# Patient Record
Sex: Female | Born: 1950 | Hispanic: No | Marital: Married | Smoking: Never smoker
Health system: Southern US, Community
[De-identification: ages and names within clinical notes are randomized; demographics above are authoritative.]

## PROBLEM LIST (undated history)

## (undated) DIAGNOSIS — E785 Hyperlipidemia, unspecified: Secondary | ICD-10-CM

## (undated) DIAGNOSIS — E119 Type 2 diabetes mellitus without complications: Secondary | ICD-10-CM

## (undated) DIAGNOSIS — T7840XA Allergy, unspecified, initial encounter: Secondary | ICD-10-CM

## (undated) DIAGNOSIS — M25561 Pain in right knee: Secondary | ICD-10-CM

## (undated) DIAGNOSIS — I10 Essential (primary) hypertension: Secondary | ICD-10-CM

## (undated) DIAGNOSIS — G8929 Other chronic pain: Secondary | ICD-10-CM

## (undated) DIAGNOSIS — M25562 Pain in left knee: Secondary | ICD-10-CM

## (undated) HISTORY — DX: Type 2 diabetes mellitus without complications: E11.9

## (undated) HISTORY — DX: Other chronic pain: G89.29

## (undated) HISTORY — DX: Hyperlipidemia, unspecified: E78.5

## (undated) HISTORY — DX: Essential (primary) hypertension: I10

## (undated) HISTORY — DX: Allergy, unspecified, initial encounter: T78.40XA

## (undated) HISTORY — DX: Pain in right knee: M25.561

## (undated) HISTORY — DX: Pain in left knee: M25.562

---

## 1992-02-18 HISTORY — PX: KNEE SURGERY: SHX244

## 1996-02-18 HISTORY — PX: DILATION AND CURETTAGE, DIAGNOSTIC / THERAPEUTIC: SUR384

## 2007-02-18 HISTORY — PX: ANKLE SURGERY: SHX546

## 2013-01-19 DIAGNOSIS — E1165 Type 2 diabetes mellitus with hyperglycemia: Secondary | ICD-10-CM | POA: Insufficient documentation

## 2014-11-22 DIAGNOSIS — E785 Hyperlipidemia, unspecified: Secondary | ICD-10-CM

## 2014-11-22 DIAGNOSIS — M545 Low back pain: Secondary | ICD-10-CM

## 2014-11-22 DIAGNOSIS — I10 Essential (primary) hypertension: Secondary | ICD-10-CM | POA: Insufficient documentation

## 2014-11-22 DIAGNOSIS — M25562 Pain in left knee: Secondary | ICD-10-CM

## 2014-11-22 DIAGNOSIS — E1169 Type 2 diabetes mellitus with other specified complication: Secondary | ICD-10-CM | POA: Insufficient documentation

## 2014-11-22 DIAGNOSIS — M25561 Pain in right knee: Secondary | ICD-10-CM

## 2014-11-22 DIAGNOSIS — G8929 Other chronic pain: Secondary | ICD-10-CM | POA: Insufficient documentation

## 2014-11-22 DIAGNOSIS — E1159 Type 2 diabetes mellitus with other circulatory complications: Secondary | ICD-10-CM | POA: Insufficient documentation

## 2016-07-24 DIAGNOSIS — J301 Allergic rhinitis due to pollen: Secondary | ICD-10-CM | POA: Insufficient documentation

## 2017-11-18 DIAGNOSIS — I10 Essential (primary) hypertension: Secondary | ICD-10-CM | POA: Diagnosis not present

## 2017-11-18 DIAGNOSIS — E1169 Type 2 diabetes mellitus with other specified complication: Secondary | ICD-10-CM | POA: Diagnosis not present

## 2017-11-18 DIAGNOSIS — Z794 Long term (current) use of insulin: Secondary | ICD-10-CM | POA: Diagnosis not present

## 2017-11-18 DIAGNOSIS — Z23 Encounter for immunization: Secondary | ICD-10-CM | POA: Diagnosis not present

## 2017-11-18 DIAGNOSIS — E785 Hyperlipidemia, unspecified: Secondary | ICD-10-CM | POA: Diagnosis not present

## 2017-11-18 DIAGNOSIS — E1165 Type 2 diabetes mellitus with hyperglycemia: Secondary | ICD-10-CM | POA: Diagnosis not present

## 2017-11-18 DIAGNOSIS — Z0000 Encounter for general adult medical examination without abnormal findings: Secondary | ICD-10-CM | POA: Diagnosis not present

## 2017-11-18 DIAGNOSIS — Z1239 Encounter for other screening for malignant neoplasm of breast: Secondary | ICD-10-CM | POA: Diagnosis not present

## 2017-11-18 DIAGNOSIS — Z1211 Encounter for screening for malignant neoplasm of colon: Secondary | ICD-10-CM | POA: Diagnosis not present

## 2017-11-18 DIAGNOSIS — Z780 Asymptomatic menopausal state: Secondary | ICD-10-CM | POA: Diagnosis not present

## 2017-11-24 ENCOUNTER — Other Ambulatory Visit: Payer: Self-pay | Admitting: Pediatrics

## 2017-11-24 DIAGNOSIS — Z780 Asymptomatic menopausal state: Secondary | ICD-10-CM

## 2017-11-24 DIAGNOSIS — Z1231 Encounter for screening mammogram for malignant neoplasm of breast: Secondary | ICD-10-CM

## 2017-12-01 DIAGNOSIS — Z6835 Body mass index (BMI) 35.0-35.9, adult: Secondary | ICD-10-CM | POA: Diagnosis not present

## 2017-12-01 DIAGNOSIS — M170 Bilateral primary osteoarthritis of knee: Secondary | ICD-10-CM | POA: Diagnosis not present

## 2017-12-01 DIAGNOSIS — M25562 Pain in left knee: Secondary | ICD-10-CM | POA: Diagnosis not present

## 2017-12-01 DIAGNOSIS — M25561 Pain in right knee: Secondary | ICD-10-CM | POA: Diagnosis not present

## 2017-12-01 DIAGNOSIS — M1712 Unilateral primary osteoarthritis, left knee: Secondary | ICD-10-CM | POA: Diagnosis not present

## 2017-12-01 DIAGNOSIS — G8929 Other chronic pain: Secondary | ICD-10-CM | POA: Diagnosis not present

## 2017-12-31 ENCOUNTER — Ambulatory Visit: Payer: Self-pay | Admitting: Nurse Practitioner

## 2018-01-01 DIAGNOSIS — E113293 Type 2 diabetes mellitus with mild nonproliferative diabetic retinopathy without macular edema, bilateral: Secondary | ICD-10-CM | POA: Diagnosis not present

## 2018-01-01 LAB — HM DIABETES EYE EXAM

## 2018-01-18 ENCOUNTER — Encounter: Payer: Self-pay | Admitting: Nurse Practitioner

## 2018-01-18 ENCOUNTER — Ambulatory Visit (INDEPENDENT_AMBULATORY_CARE_PROVIDER_SITE_OTHER): Payer: Self-pay | Admitting: Nurse Practitioner

## 2018-01-18 ENCOUNTER — Other Ambulatory Visit: Payer: Self-pay

## 2018-01-18 VITALS — BP 120/67 | HR 79 | Temp 98.7°F | Ht 62.0 in | Wt 194.2 lb

## 2018-01-18 DIAGNOSIS — E1165 Type 2 diabetes mellitus with hyperglycemia: Secondary | ICD-10-CM

## 2018-01-18 DIAGNOSIS — H25019 Cortical age-related cataract, unspecified eye: Secondary | ICD-10-CM | POA: Insufficient documentation

## 2018-01-18 DIAGNOSIS — Z7689 Persons encountering health services in other specified circumstances: Secondary | ICD-10-CM

## 2018-01-18 DIAGNOSIS — Z794 Long term (current) use of insulin: Secondary | ICD-10-CM

## 2018-01-18 MED ORDER — INSULIN DEGLUDEC 100 UNIT/ML ~~LOC~~ SOPN: 26.0000 [IU] | PEN_INJECTOR | Freq: Every day | SUBCUTANEOUS | 1 refills | Status: DC

## 2018-01-18 MED ORDER — SEMAGLUTIDE(0.25 OR 0.5MG/DOS) 2 MG/1.5ML ~~LOC~~ SOPN: 0.5000 mg | PEN_INJECTOR | SUBCUTANEOUS | 2 refills | Status: DC

## 2018-01-18 MED ORDER — PEN NEEDLES 32G X 5 MM MISC: 1.0000 | Freq: Every day | 3 refills | Status: DC

## 2018-01-18 NOTE — Patient Instructions (Addendum)
Summer Young,   Thank you for coming in to clinic today.  1. [place] about your silver sneakers benefits [phone]  [address]  [place], [place] [address]  2. Try to eat only 1-3 carb servings per meal. - Focus on low glycemic carbohydrates  3. Check blood sugars 3 times daily AND any time you feel low. Anything less than 75 treat as a low and have a carb snack.    4. STOP 70/30 insulin  - START Tresiba 26 units once daily.  Basal insulin - cover without peak over 24 hours.  - START Ozempic 0.25 mg every 7 days for first 2 weeks,  Then 0.5 mg every 7 days and continue to your next appointment.  Continue metformin and glipizide without change.  Phone call from [employer] for diabetes AND from lifestyle center for diabetes classes.  Please schedule a follow-up appointment with [name], AGNP. Return in about 6 weeks (around 03/01/2018) for diabetes with A1c and your sugar log.  If you have any other questions or concerns, please feel free to call the clinic or send a message through [place]. You may also schedule an earlier appointment if necessary.  You will receive a survey after today's visit either digitally by e-mail or paper by [employer]. Your experiences and feedback matter to [place].  Please respond so we know how we are doing as we provide care for you.   [name], DNP, AGNP-BC Adult Gerontology Nurse Practitioner [place]

## 2018-01-18 NOTE — Progress Notes (Signed)
Subjective:    Patient ID: Summer Young, female    DOB: October 15, 1950, 67 y.o.   MRN: [id number]  Summer Young is a 67 y.o. female presenting on 01/18/2018 for Establish Care (diabetes, Knee surgery schedule in 05/2017)  HPI Establish Care New Provider Pt last seen by PCP [facility] [place].  Obtain records from [facility].    Diabetes Requests endocrinology for DM.  She states she was previously well controlled with A1c less than 8.0% when she was on Jardiance and metformin in past.  Since being on insulin, has had hard to control A1c.  - Patient is scheduled for knee surgery in April 2019 with [facility] Dr. Merlene Morse.  He encouraged endocrinology as it is recommended to have A1c <7.0% before surgery. - Patient reports she has used several insulins in past.  Is currently on Humulin 70/30, continues to have increased A1c - last = 9.2.  Patient has had A1c between 9-10% for last 3 years, verified by review of [facility] labs. - Is currently on metformin, glipizide 2.5 mg 24 hr tab, and Humulin 70/30 - Is having lows several times per week with 70/30 insulin.  Has been on 20 units, 25 in evening and sometimes only 20 to prevent overnight low.  Patient notes shakiness, chills, weakness, sensation of near syncope with perceived low.  Patient has not checked actual low blood sugar value with symptoms. - States she has difficulty knowing what to eat.  Eats small meals, few refined carbs.   - Patient is checking CBG daily.  In am usually 102-130.   - Patient reports higher CBGs after meals in past, but no recent checks later in day.   - She is not currently symptomatic.  - She denies polydipsia, polyphagia, polyuria, headaches, diaphoresis, shakiness, chills, pain, numbness or tingling in extremities and changes in vision.   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 - Her diet is moderate in salt, moderate in fat, and moderate in  carbohydrates. - Patient stopped Jardiance 2/2 frequent yeast infections.  Stopped Levemir 2/2 skin irritation.  PREVENTION: Eye exam current (within one year): no Foot exam current (within one year): no  Lipid/ASCVD risk reduction - on statin: yes Kidney protection - on ace or arb: yes   Past Medical History:  Diagnosis Date  . Chronic pain of both knees   . Diabetes mellitus without complication ([place])   . Hyperlipidemia   . Hypertension    Past Surgical History:  Procedure Laterality Date  . ANKLE SURGERY Left 2009  . DILATION AND CURETTAGE, DIAGNOSTIC / THERAPEUTIC  1998   miscarriage  . KNEE SURGERY Left 1994   ACL   Social History   Socioeconomic History  . Marital status: Married    Spouse name: Not on file  . Number of children: Not on file  . Years of education: Not on file  . Highest education level: High school graduate  Occupational History  . Not on file  Social Needs  . Financial resource strain: Not on file  . Food insecurity:    Worry: Not on file    Inability: Not on file  . Transportation needs:    Medical: Not on file    Non-medical: Not on file  Tobacco Use  . Smoking status: Never Smoker  . Smokeless tobacco: Never Used  Substance and Sexual Activity  . Alcohol use: Yes    Comment: occasinal  . Drug use: Never  .  Sexual activity: Yes    Birth control/protection: None  Lifestyle  . Physical activity:    Days per week: Not on file    Minutes per session: Not on file  . Stress: Not on file  Relationships  . Social connections:    Talks on phone: Not on file    Gets together: Not on file    Attends religious service: Not on file    Active member of club or organization: Not on file    Attends meetings of clubs or organizations: Not on file    Relationship status: Not on file  . Intimate partner violence:    Fear of current or ex partner: Not on file    Emotionally abused: Not on file    Physically abused: Not on file    Forced  sexual activity: Not on file  Other Topics Concern  . Not on file  Social History Narrative  . Not on file   Family History  Problem Relation Age of Onset  . Diabetes Mother   . Diabetes Father   . Stroke Father   . Diabetes Sister   . Heart attack Other    Current Outpatient Medications on File Prior to Visit  Medication Sig  . atorvastatin (LIPITOR) 40 MG tablet Take 40 mg by mouth daily.  . Biotin 1000 MCG tablet Take by mouth.  . Cholecalciferol (VITAMIN D3) 50 MCG (2000 UT) capsule Take by mouth.  Marland Kitchen glipiZIDE (GLUCOTROL XL) 2.5 MG 24 hr tablet Take by mouth.  Marland Kitchen ibuprofen (ADVIL,MOTRIN) 400 MG tablet Take by mouth.  . Insulin Isophane & Regular Human (HUMULIN 70/30 KWIKPEN) (70-30) 100 UNIT/ML PEN Inject into the skin.  . metFORMIN (GLUCOPHAGE-XR) 500 MG 24 hr tablet Take 3 tablets by mouth daily with supper  . spironolactone (ALDACTONE) 25 MG tablet Take 25 mg by mouth daily.  . vitamin B-12 (CYANOCOBALAMIN) 1000 MCG tablet Take by mouth.  . Flax Oil-Fish Oil-Borage Oil CAPS Take by mouth.   No current facility-administered medications on file prior to visit.     Review of Systems  Constitutional: Negative for activity change, appetite change, fatigue and unexpected weight change.  Eyes: Negative for pain and visual disturbance.  Respiratory: Negative for cough, chest tightness and shortness of breath.   Cardiovascular: Negative for chest pain, palpitations and leg swelling.  Gastrointestinal: Negative for abdominal pain, blood in stool, constipation, diarrhea, nausea and vomiting.  Endocrine: Negative for cold intolerance, heat intolerance, polydipsia, polyphagia and polyuria.  Genitourinary: Negative for frequency.  Musculoskeletal: Negative for arthralgias, gait problem, myalgias and neck stiffness.  Skin: Negative for rash.  Neurological: Negative for dizziness, speech difficulty, weakness, light-headedness, numbness and headaches.  Hematological: Negative for  adenopathy.  Psychiatric/Behavioral: Negative for dysphoric mood and sleep disturbance. The patient is not nervous/anxious.    Per HPI unless specifically indicated above     Objective:    BP 120/67 (BP Location: Left Arm, Patient Position: Sitting, Cuff Size: Large)   Pulse 79   Temp 98.7 F (37.1 C) (Oral)   Ht 5\' 2"  (1.575 m)   Wt 194 lb 3.2 oz (88.1 kg)   BMI 35.52 kg/m   Wt Readings from Last 3 Encounters:  01/18/18 194 lb 3.2 oz (88.1 kg)    Physical Exam  Constitutional: She is oriented to person, place, and time. She appears well-developed and well-nourished. No distress.  HENT:  Head: Normocephalic and atraumatic.  Neck: Normal range of motion. Neck supple. Carotid bruit is not present.  Cardiovascular: Normal rate, regular rhythm, S1 normal, S2 normal, normal heart sounds and intact distal pulses.  Pulmonary/Chest: Effort normal and breath sounds normal. No respiratory distress.  Musculoskeletal: She exhibits no edema (pedal).  Neurological: She is alert and oriented to person, place, and time.  Skin: Skin is warm and dry. Capillary refill takes less than 2 seconds.  Psychiatric: She has a normal mood and affect. Her behavior is normal. Judgment and thought content normal.  Vitals reviewed.      Assessment & Plan:   Problem List Items Addressed This Visit      Endocrine   Type 2 diabetes mellitus with hyperglycemia ([place]) - Primary    UncontrolledDM with A1c 9.2% on last check 11/18/2017 at [facility] primary care.  Stable over last 3 years and has been uncontrolled for the duration of last 3 years. Patient is willing to try new medicines as she does not like taking Humulin 70/30 Goal A1c < 7.0%. - Complications - hyperlipidemia, hyperglycemia, hypoglycemia and cataracts.  Plan:  1. Change therapy:  - CONTINUE metformin and glipizide - STOP Humulin 70/30 - START Tresiba 26 units once daily - START Ozempicinjection.  Inject 0.25 mg weekly for 4 weeks.  Increase to 0.5  mg weekly and continue to next visit.  Will likely continue with increase to 1 mg weekly at that time. 2. Encourage improved lifestyle: - low carb/low glycemic diet handout provided - Increase physical activity to 30 minutes most days of the week.  Explained that increased physical activity increases body's use of sugar for energy. - Referral placed for lifestyle center for DM education 3. Check fasting am CBG and two additional daily CBGs.  Bring log to next visit for review - check as given on handout. 4. Continue ASA, ACEi and Statin 5. Referral also placed to [facility] for Care Management for DM to assist with bringing patient's A1c to goal. 6. Follow-up 4-6 weeks.  Patient to call if CBG fasting in am worsens with change of insulin.  Can consider endocrinology referral at any time.      Relevant Medications   atorvastatin (LIPITOR) 40 MG tablet   glipiZIDE (GLUCOTROL XL) 2.5 MG 24 hr tablet   Insulin Isophane & Regular Human (HUMULIN 70/30 KWIKPEN) (70-30) 100 UNIT/ML PEN   metFORMIN (GLUCOPHAGE-XR) 500 MG 24 hr tablet   insulin degludec (TRESIBA FLEXTOUCH) 100 UNIT/ML SOPN FlexTouch Pen   Insulin Pen Needle (PEN NEEDLES) 32G X 5 MM MISC   Semaglutide,0.25 or 0.5MG /DOS, (OZEMPIC, 0.25 OR 0.5 MG/DOSE,) 2 MG/1.5ML SOPN   Other Relevant Orders   AMB Referral to [place] [facility]   Ambulatory referral to diabetic education    Other Visit Diagnoses    Encounter to establish care         Previous PCP was at [facility].  Records are reviewed in [place].  Past medical, family, and surgical history reviewed w/ patient in clinic.   Meds ordered this encounter  Medications  . insulin degludec (TRESIBA FLEXTOUCH) 100 UNIT/ML SOPN FlexTouch Pen    Sig: Inject 0.26 mLs (26 Units total) into the skin daily.    Dispense:  3 pen    Refill:  1    Order Specific Question:   Supervising Provider    Answer:   Olin Hauser [[id number]]  . Insulin Pen Needle (PEN NEEDLES) 32G  X 5 MM MISC    Sig: 1 Device by Does not apply route daily.    Dispense:  100 each  Refill:  3    Order Specific Question:   Supervising Provider    Answer:   Olin Hauser [[id number]]  . Semaglutide,0.25 or 0.5MG /DOS, (OZEMPIC, 0.25 OR 0.5 MG/DOSE,) 2 MG/1.5ML SOPN    Sig: Inject 0.5 mg into the skin every 7 (seven) days.    Dispense:  2 pen    Refill:  2    Order Specific Question:   Supervising Provider    Answer:   Olin Hauser [[id number]]     Follow up plan: Return in about 6 weeks (around 03/01/2018) for diabetes with A1c and your sugar log.  [name], DNP, AGPCNP-BC Adult Gerontology Primary Care Nurse Practitioner [facility] 01/18/2018, 10:35 AM

## 2018-01-18 NOTE — Assessment & Plan Note (Addendum)
UncontrolledDM with A1c 9.2% on last check 11/18/2017 at [facility] primary care.  Stable over last 3 years and has been uncontrolled for the duration of last 3 years. Patient is willing to try new medicines as she does not like taking Humulin 70/30 Goal A1c < 7.0%. - Complications - hyperlipidemia, hyperglycemia, hypoglycemia and cataracts.  Plan:  1. Change therapy:  - CONTINUE metformin and glipizide - STOP Humulin 70/30 - START Tresiba 26 units once daily - START Ozempicinjection.  Inject 0.25 mg weekly for 4 weeks.  Increase to 0.5 mg weekly and continue to next visit.  Will likely continue with increase to 1 mg weekly at that time. 2. Encourage improved lifestyle: - low carb/low glycemic diet handout provided - Increase physical activity to 30 minutes most days of the week.  Explained that increased physical activity increases body's use of sugar for energy. - Referral placed for lifestyle center for DM education 3. Check fasting am CBG and two additional daily CBGs.  Bring log to next visit for review - check as given on handout. 4. Continue ASA, ACEi and Statin 5. Referral also placed to [facility] for Care Management for DM to assist with bringing patient's A1c to goal. 6. Follow-up 4-6 weeks.  Patient to call if CBG fasting in am worsens with change of insulin.  Can consider endocrinology referral at any time.

## 2018-01-19 ENCOUNTER — Other Ambulatory Visit: Payer: Self-pay | Admitting: *Deleted

## 2018-01-19 DIAGNOSIS — Z794 Long term (current) use of insulin: Principal | ICD-10-CM

## 2018-01-19 DIAGNOSIS — E119 Type 2 diabetes mellitus without complications: Secondary | ICD-10-CM

## 2018-01-19 NOTE — Patient Outreach (Signed)
[facility]) Care Management  01/19/2018  Summer Young 01-02-1951 [id number]  TELEPHONE SCREENING Referral date: 01/18/18 Referral source: Provider Office Referral reason: Diabetes Management Insurance: [employer] PPO Plan I   Telephone call to patient regarding chronic disease management of diabetes.  Subjective: [name] states she is scheduled to have left total knee replacement in late April 2020 and her surgeon told her that in order to do the surgery her Hgb A1C must be <7.0%. She states her most recent Hgb A1C was 9.2% and that she has not met her target A1C of <7.0% since 2010; with her baseline A1C  between 8.0-9.0%. She says she has had diabetes for 28 years.  She says her provider has asked her to attend diabetes education classes at the [place] [facility]. 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  She does not have an advance directive or health care power of attorney and is interested in completing these forms before her surgery in April.  PLAN:  Will make referral to [place] for ongoing diabetes self management assistance. Will make referral to [facility] social worker for advance directive assistance.

## 2018-01-22 ENCOUNTER — Other Ambulatory Visit: Payer: Self-pay | Admitting: *Deleted

## 2018-01-22 ENCOUNTER — Encounter: Payer: Self-pay | Admitting: *Deleted

## 2018-01-22 NOTE — Patient Outreach (Addendum)
[facility]) Care Management  01/22/2018  Nancy Arvin 05-Jul-1950 [id number]   Phone call to patient to follow up on Advanced Directive needs. Per patient, she would like this Education officer, museum to send the document in the mail for review.    Plan: This Education officer, museum will send the Advanced Directive Living will document in the mail and will contact patient within 2 weeks to review it for completion.   [name] [facility] Care Management [phone]

## 2018-01-22 NOTE — Patient Outreach (Signed)
[facility]) Care Management  01/22/2018  Summer Young May 03, 1950 [id number]   Initial Contact  Patient referred to this social worker to assist with the completion of her Advanced Directive. Patient contacted but did not answer. Voicemail message left for a return call.   Plan: This Education officer, museum will send patient an Economist. This social worker will make second attempt to reach patient within 3-4 business days.   [name] [facility] Care Management [phone]

## 2018-01-27 ENCOUNTER — Ambulatory Visit: Payer: Self-pay | Admitting: *Deleted

## 2018-01-27 ENCOUNTER — Other Ambulatory Visit: Payer: Self-pay | Admitting: *Deleted

## 2018-01-27 NOTE — Patient Outreach (Signed)
[facility]) Care Management  01/27/2018  Summer Young 1950-07-12 [id number]   Phone call to patient to follow up on the  Advanced Directive/Living Will document that I mailed to her home to see if she needed any assistance in completing the document. Voicemail message left for a return call.   [name] [facility] Care Management [phone]

## 2018-01-28 ENCOUNTER — Telehealth: Payer: Self-pay | Admitting: Nurse Practitioner

## 2018-01-28 ENCOUNTER — Other Ambulatory Visit: Payer: Self-pay | Admitting: *Deleted

## 2018-01-28 DIAGNOSIS — R1013 Epigastric pain: Secondary | ICD-10-CM

## 2018-01-28 NOTE — Telephone Encounter (Signed)
The pt called complaining of this dull pain in the middle of her upper abdominal x 2 days. The pt thinks it could be associated with her new diabetes medication.  She administers the injection in her abdomen, but no pain to the injection site. She describe the pain as a tired feeling in her abdomen. No nausea, vomiting or diarrhea, but she admits she have been gassy . She has a history of Diverticulitis. Please advise

## 2018-01-28 NOTE — Telephone Encounter (Signed)
Labs tomorrow for pancreatic enzymes.  Likely is related to Ozempic and abdominal pain associated with starting.  Can get better after first 2-4 weeks.   Labs will be to check for possible pancreatitis, which is also a rare but known complication of Ozempic.  Continue insulin without changes.

## 2018-01-28 NOTE — Telephone Encounter (Signed)
Pt said she thought she was having a reaction to insulin.  Please call [phone]

## 2018-01-28 NOTE — Telephone Encounter (Signed)
The pt was notified and will come tomorrow to get her labs drawn.

## 2018-01-28 NOTE — Telephone Encounter (Signed)
Attempted to contact the pt back, no answer. LMOM to return my call.  

## 2018-01-28 NOTE — Patient Outreach (Signed)
[facility]) Care Management  01/28/2018  Summer Young 03/30/1950 [id number]   Return hone call from patient to follow up on Advanced Directive/Living Will mailed to her home. Per patient, she has not checked the mail due to being ill. Per patient, she was started on a new insulin which is irritating her stomach.h. Per patient, she has left a message for the doctor and if she does not receive a return call, she will consider going to urgent care.   Plan: This Education officer, museum will follow up with patient within 1 week regarding receipt of the Advanced Directive/Living Will document and to assess of there are any needs.   [name] [facility] Care Management [phone]

## 2018-01-29 ENCOUNTER — Encounter (INDEPENDENT_AMBULATORY_CARE_PROVIDER_SITE_OTHER): Payer: Self-pay

## 2018-01-29 ENCOUNTER — Encounter: Payer: Self-pay | Admitting: *Deleted

## 2018-01-29 ENCOUNTER — Other Ambulatory Visit: Payer: PPO

## 2018-01-29 ENCOUNTER — Encounter: Payer: PPO | Attending: Nurse Practitioner | Admitting: *Deleted

## 2018-01-29 VITALS — BP 126/72 | Ht 62.0 in | Wt 190.2 lb

## 2018-01-29 DIAGNOSIS — Z794 Long term (current) use of insulin: Secondary | ICD-10-CM | POA: Insufficient documentation

## 2018-01-29 DIAGNOSIS — E119 Type 2 diabetes mellitus without complications: Secondary | ICD-10-CM

## 2018-01-29 DIAGNOSIS — E1165 Type 2 diabetes mellitus with hyperglycemia: Secondary | ICD-10-CM | POA: Diagnosis not present

## 2018-01-29 DIAGNOSIS — R1013 Epigastric pain: Secondary | ICD-10-CM | POA: Diagnosis not present

## 2018-01-29 DIAGNOSIS — Z713 Dietary counseling and surveillance: Secondary | ICD-10-CM | POA: Diagnosis not present

## 2018-01-29 LAB — CBC WITH DIFFERENTIAL/PLATELET
Basophils Absolute: 20 cells/uL (ref 0–200)
Basophils Relative: 0.2 %
Eosinophils Absolute: 60 cells/uL (ref 15–500)
Eosinophils Relative: 0.6 %
HCT: 41.5 % (ref 35.0–45.0)
Hemoglobin: 13.9 g/dL (ref 11.7–15.5)
Lymphs Abs: 3400 cells/uL (ref 850–3900)
MCH: 27.5 pg (ref 27.0–33.0)
MCHC: 33.5 g/dL (ref 32.0–36.0)
MCV: 82 fL (ref 80.0–100.0)
MPV: 9.8 fL (ref 7.5–12.5)
Monocytes Relative: 4.2 %
Neutro Abs: 6100 cells/uL (ref 1500–7800)
Neutrophils Relative %: 61 %
Platelets: 383 10*3/uL (ref 140–400)
RBC: 5.06 10*6/uL (ref 3.80–5.10)
RDW: 13.2 % (ref 11.0–15.0)
Total Lymphocyte: 34 %
WBC mixed population: 420 cells/uL (ref 200–950)
WBC: 10 10*3/uL (ref 3.8–10.8)

## 2018-01-29 LAB — COMPLETE METABOLIC PANEL WITH GFR
AG Ratio: 1.8 (calc) (ref 1.0–2.5)
ALT: 18 U/L (ref 6–29)
AST: 20 U/L (ref 10–35)
Albumin: 4.5 g/dL (ref 3.6–5.1)
Alkaline phosphatase (APISO): 125 U/L (ref 33–130)
BUN: 13 mg/dL (ref 7–25)
CO2: 24 mmol/L (ref 20–32)
Calcium: 9.4 mg/dL (ref 8.6–10.4)
Chloride: 104 mmol/L (ref 98–110)
Creat: 0.81 mg/dL (ref 0.50–0.99)
GFR, Est African American: 88 mL/min/{1.73_m2} (ref >=60)
GFR, Est Non African American: 76 mL/min/{1.73_m2} (ref >=60)
Globulin: 2.5 g/dL (calc) (ref 1.9–3.7)
Glucose, Bld: 171 mg/dL — ABNORMAL HIGH (ref 65–99)
Potassium: 4.6 mmol/L (ref 3.5–5.3)
Sodium: 138 mmol/L (ref 135–146)
Total Bilirubin: 0.4 mg/dL (ref 0.2–1.2)
Total Protein: 7 g/dL (ref 6.1–8.1)

## 2018-01-29 LAB — AMYLASE: Amylase: 38 U/L (ref 21–101)

## 2018-01-29 LAB — LIPASE: Lipase: 18 U/L (ref 7–60)

## 2018-01-29 NOTE — Progress Notes (Signed)
Diabetes Self-Management Education  Visit Type: First/Initial  Appt. Start Time: 0900 Appt. End Time: 8182  01/29/2018  Ms. Summer Young, identified by name and date of birth, is a 67 y.o. female with a diagnosis of Diabetes: Type 2.   ASSESSMENT  Blood pressure 126/72, height 5\' 2"  (1.575 m), weight 190 lb 3.2 oz (86.3 kg). Body mass index is 34.79 kg/m.  Diabetes Self-Management Education - 01/29/18 1037      Visit Information   Visit Type  First/Initial      Initial Visit   Diabetes Type  Type 2    Are you currently following a meal plan?  No    Are you taking your medications as prescribed?  Yes    Date Diagnosed  26 years ago      Health Coping   How would you rate your overall health?  Fair      Psychosocial Assessment   Patient Belief/Attitude about Diabetes  Motivated to manage diabetes   "frustrated"   Self-care barriers  None    Self-management support  Doctor's office;Family    Patient Concerns  Nutrition/Meal planning;Medication;Monitoring;Healthy Lifestyle;Problem Solving;Glycemic Control;Weight Control    Special Needs  None    Preferred Learning Style  Auditory;Visual;Hands on    [place] in progress    How often do you need to have someone help you when you read instructions, pamphlets, or other written materials from your doctor or pharmacy?  1 - Never    What is the last grade level you completed in school?  high school      Pre-Education Assessment   Patient understands the diabetes disease and treatment process.  Needs Review    Patient understands incorporating nutritional management into lifestyle.  Needs Instruction    Patient undertands incorporating physical activity into lifestyle.  Needs Instruction    Patient understands using medications safely.  Needs Review    Patient understands monitoring blood glucose, interpreting and using results  Needs Review    Patient understands prevention, detection, and treatment of acute  complications.  Needs Review    Patient understands prevention, detection, and treatment of chronic complications.  Needs Review    Patient understands how to develop strategies to address psychosocial issues.  Needs Instruction    Patient understands how to develop strategies to promote health/change behavior.  Needs Instruction      Complications   Last HgB A1C per patient/outside source  9.2 %   11/18/17   How often do you check your blood sugar?  1-2 times/day    Fasting Blood glucose range (mg/dL)  70-129;130-179   FBG's 108-162 mg/dL   Postprandial Blood glucose range (mg/dL)  130-179;>200   Pt reports pp's 160's mg/dL with 1 reading of 212 mg/dL after breakfast.    Have you had a dilated eye exam in the past 12 months?  Yes    Have you had a dental exam in the past 12 months?  Yes    Are you checking your feet?  Yes    How many days per week are you checking your feet?  7      Dietary Intake   Breakfast  pumpernickle bread with cheese and egg; oatmeal with milk and 1/2 apple; cereal with bran flakes and milk    Snack (morning)  apple, strawberries, nuts, cheese    Lunch  salads with root vegetables, bread, chicken or pork or tuna    Snack (afternoon)  same as mornings  Dinner  chicken, occasional beef, pork, salmon with yuko, taro, peas, beans, brown rice, occasional corn, broccoli, cauliflower, green beans    Beverage(s)  water, cup of coffee with sugar      Exercise   Exercise Type  ADL's      Patient Education   Previous Diabetes Education  No    Disease state   Definition of diabetes, type 1 and 2, and the diagnosis of diabetes;Factors that contribute to the development of diabetes    Nutrition management   Role of diet in the treatment of diabetes and the relationship between the three main macronutrients and blood glucose level;Reviewed blood glucose goals for pre and post meals and how to evaluate the patients' food intake on their blood glucose level.    Physical  activity and exercise   Role of exercise on diabetes management, blood pressure control and cardiac health.    Medications  Taught/reviewed insulin injection, site rotation, insulin storage and needle disposal.;Reviewed patients medication for diabetes, action, purpose, timing of dose and side effects.    Monitoring  Purpose and frequency of SMBG.;Taught/discussed recording of test results and interpretation of SMBG.;Identified appropriate SMBG and/or A1C goals.    Acute complications  Taught treatment of hypoglycemia - the 15 rule.    Chronic complications  Relationship between chronic complications and blood glucose control    Psychosocial adjustment  Identified and addressed patients feelings and concerns about diabetes      Individualized Goals (developed by patient)   Reducing Risk  Improve blood sugars Decrease medications Prevent diabetes complications Lose weight Lead a healthier lifestyle Become more fit     Outcomes   Expected Outcomes  Demonstrated interest in learning. Expect positive outcomes    Future DMSE  4-6 wks       Individualized Plan for Diabetes Self-Management Training:   Learning Objective:  Patient will have a greater understanding of diabetes self-management. Patient education plan is to attend individual and/or group sessions per assessed needs and concerns.   Plan:   Patient Instructions  Check blood sugars 2 x day before breakfast and 2 hrs after supper every day or as directed by MD Bring blood sugar records to the next class Exercise: Walk as tolerated Eat 3 meals day,  1-2  snacks a day Space meals 4-6 hours apart Avoid sugar sweetened drinks (coffee) Carry fast acting glucose and a snack at all times Rotate injection sites  Expected Outcomes:  Demonstrated interest in learning. Expect positive outcomes  Education material provided:  General Meal Planning Guidelines Simple Meal Plan Glucose tablets Symptoms, causes and treatments of  Hypoglycemia  If problems or questions, patient to contact team via:  Summer Young, [place], [place], CDE [phone]  Future DSME appointment: 4-6 wks  March 04, 2018 for Diabetes Class 1

## 2018-01-29 NOTE — Patient Instructions (Signed)
Check blood sugars 2 x day before breakfast and 2 hrs after supper every day or as directed by MD Bring blood sugar records to the next class  Exercise: Walk as tolerated  Eat 3 meals day,  1-2  snacks a day Space meals 4-6 hours apart Avoid sugar sweetened drinks (coffee)  Carry fast acting glucose and a snack at all times Rotate injection sites  Return for classes on:

## 2018-02-01 ENCOUNTER — Encounter: Payer: Self-pay | Admitting: Nurse Practitioner

## 2018-02-01 ENCOUNTER — Other Ambulatory Visit: Payer: Self-pay | Admitting: *Deleted

## 2018-02-01 NOTE — Progress Notes (Signed)
[place] [facility]) Care Management  02/01/2018  Summer Young Jun 08, 1950 [id number]   Phone call to patient to see of she had received the Advanced Directive packet. Per patient, she had not received the Advanced Directive packet and would like for it to be mailed out again.    Plan: This social worker will mail out the packet again and will follow up with patient to make sure it is received within 2 weeks.   [name] [facility] Care Management [phone]

## 2018-02-01 NOTE — Telephone Encounter (Signed)
Please advise 

## 2018-02-08 ENCOUNTER — Encounter: Payer: Self-pay | Admitting: Nurse Practitioner

## 2018-02-08 DIAGNOSIS — Z794 Long term (current) use of insulin: Principal | ICD-10-CM

## 2018-02-08 DIAGNOSIS — E1165 Type 2 diabetes mellitus with hyperglycemia: Secondary | ICD-10-CM

## 2018-02-08 MED ORDER — INSULIN DEGLUDEC 100 UNIT/ML ~~LOC~~ SOPN: 26.0000 [IU] | PEN_INJECTOR | Freq: Every day | SUBCUTANEOUS | 1 refills | Status: DC

## 2018-02-11 ENCOUNTER — Ambulatory Visit: Payer: Self-pay | Admitting: *Deleted

## 2018-02-12 ENCOUNTER — Other Ambulatory Visit: Payer: Self-pay | Admitting: *Deleted

## 2018-02-12 ENCOUNTER — Ambulatory Visit: Payer: Self-pay | Admitting: *Deleted

## 2018-02-12 NOTE — Patient Outreach (Signed)
[place] [facility]) Care Management  02/12/2018  Summer Young 10-01-1950 [id number]   Phone call to patient to confirm that she received the Advanced Directive packet mailed to her and if she needed any assistance to complete it . Patient did not answer. Voicemail message left requesting a return call.   [name] [facility] Care Management [phone]

## 2018-02-15 ENCOUNTER — Other Ambulatory Visit: Payer: Self-pay | Admitting: *Deleted

## 2018-02-15 NOTE — Patient Outreach (Signed)
[place] [facility]) Care Management  02/15/2018  Jordan Caraveo 1950/10/31 [id number]   [place] Initial Assessment  Referral Date:  01/19/2018 Referral Source:  Provider Office Reason for Referral:  Disease Management Education Insurance:  Health Team Advantage   Outreach Attempt:  Outreach attempt #1 to patient for introduction and initial telephone assessment.  Patient answered and verified HIPAA.  RN Health Coach introduced self and role.  Patient verbally agrees to Disease Management Outreaches.  Stated she was currently at work and unable to complete initial telephone assessment at this time.  Reuqesting another telephone call back.   Plan: RN Health Coach will make another outreach attempt within the month of January.  [place] [phone] [email or web address]

## 2018-02-24 ENCOUNTER — Other Ambulatory Visit: Payer: Self-pay | Admitting: *Deleted

## 2018-02-24 NOTE — Patient Outreach (Signed)
[place] [facility]) Care Management  02/24/2018  Daniyla Pfahler 02-03-1951 [id number]   Phone call to patient to confirm that she has received the Advanced Directive packet mailed to her. Per patient, she did receive the packet. Patient declined need for assistance with the document or any additional community resource information. This social worker encouraged patient to call if there any questions or concerns in the future. This Education officer, museum will sign off at this time.   [name] [facility] Care Management [phone]

## 2018-02-26 ENCOUNTER — Other Ambulatory Visit: Payer: Self-pay | Admitting: *Deleted

## 2018-02-26 ENCOUNTER — Encounter: Payer: Self-pay | Admitting: *Deleted

## 2018-02-26 DIAGNOSIS — E119 Type 2 diabetes mellitus without complications: Secondary | ICD-10-CM | POA: Insufficient documentation

## 2018-02-26 DIAGNOSIS — E785 Hyperlipidemia, unspecified: Secondary | ICD-10-CM | POA: Insufficient documentation

## 2018-02-26 DIAGNOSIS — R109 Unspecified abdominal pain: Secondary | ICD-10-CM | POA: Insufficient documentation

## 2018-02-26 DIAGNOSIS — J309 Allergic rhinitis, unspecified: Secondary | ICD-10-CM | POA: Insufficient documentation

## 2018-02-26 NOTE — Patient Outreach (Signed)
[place] [facility]) Care Management  [place]  02/26/2018   Summer Young Apr 08, 1950 [id number]   [place] Initial Assessment   Referral Date:  01/19/2018 Referral Source:  Provider Office Reason for Referral:  Disease Management Education Insurance:  Health Team Advantage   Outreach Attempt:  Successful telephone outreach to patient for initial telephone assessment.  HIPAA verified with patient.  Patient completed initial telephone assessment.  Social:  Patient lives at home with husband.  Independent with ADLs and IADLs.  Ambulates independently, continues to work, and denies any falls in the past year.  Transports herself to her medical appointments.  DME in the home include:  CBG meter, shower chair with back, eyeglasses, upper and lower dentures, and grab bars in shower and around toilet.  Conditions:  Per chart review and discussion with patient, PMH include but not limited to:  Diabetes, chronic bilateral knee pain, hyperlipidemia, hypertension, and cataract removal.  Denies any recent emergency room visits or hospitalizations.  Patient reporting she is needing knee surgery and can not have it done without reducing her Hgb A1C.  Last document Hgb A1C is 9.2 on 11/18/2017.  States she has had diabetes for years (1st with gestational diabetes in 1992, then with every other unsuccessful pregnancy, and the last one she was diagnosed diabetic); but has never received formal diabetes education.  Plans to attend Diabetes Education at the [facility] in [place] 03/04/2018.  It is a series of 3 classes and she plans to attend all 3.  Patient stating she did receive some brochures about diabetes and since then her blood sugars have been reduced.  Monitors blood sugars about 3 times a day.  Fasting blood sugar this morning was 126 with fasting ranges of 87-115.  Medications:  Patient reports taking about 6 medications and manages her medications herself without  difficulties.  Requesting medication assistance for her [name] prescription that is about $200 for a month supply.  [place] services reviewed and discussed with patient.  Patient verbally agrees to referral.  Encounter Medications:  Outpatient Encounter Medications as of 02/26/2018  Medication Sig Note  . atorvastatin (LIPITOR) 40 MG tablet Take 40 mg by mouth daily.   . Biotin 1000 MCG tablet Take by mouth every other day.  01/19/2018: Takes every other day  . Cholecalciferol (VITAMIN D3) 50 MCG (2000 UT) capsule Take 2,000 Units by mouth daily.    . Flax Oil-Fish Oil-Borage Oil CAPS Take by mouth.   [place] Kitchen glipiZIDE (GLUCOTROL XL) 2.5 MG 24 hr tablet Take 2.5 mg by mouth daily with breakfast.  01/19/2018: Takes after breakfast but sometimes forgets once a week  . ibuprofen (ADVIL,MOTRIN) 400 MG tablet Take 400 mg by mouth every 6 (six) hours as needed.    . insulin degludec (TRESIBA FLEXTOUCH) 100 UNIT/ML SOPN FlexTouch Pen Inject 0.26 mLs (26 Units total) into the skin daily.   . Insulin Pen Needle (PEN NEEDLES) 32G X 5 MM MISC 1 Device by Does not apply route daily.   . metFORMIN (GLUCOPHAGE-XR) 500 MG 24 hr tablet Take 1,500 mg by mouth daily after supper.    . Semaglutide,0.25 or 0.5MG /DOS, (OZEMPIC, 0.25 OR 0.5 MG/DOSE,) 2 MG/1.5ML SOPN Inject 0.5 mg into the skin every 7 (seven) days. 01/19/2018: Started this morning 01/19/18  . spironolactone (ALDACTONE) 25 MG tablet Take 25 mg by mouth daily.   . vitamin B-12 (CYANOCOBALAMIN) 1000 MCG tablet Take 1,000 mcg by mouth daily.     No facility-administered  encounter medications on file as of 02/26/2018.     Functional Status:  In your present state of health, do you have any difficulty performing the following activities: 02/26/2018  Hearing? N  Vision? N  Difficulty concentrating or making decisions? N  Walking or climbing stairs? N  Dressing or bathing? N  Doing errands, shopping? N  Preparing Food and eating ? N  Using the Toilet? N   In the past six months, have you accidently leaked urine? Y  Do you have problems with loss of bowel control? N  Managing your Medications? N  Managing your Finances? N  Housekeeping or managing your Housekeeping? N    Fall/Depression Screening: Fall Risk  02/26/2018 01/29/2018  Falls in the past year? 0 0  Follow up Falls evaluation completed -   PHQ 2/9 Scores 02/26/2018 01/29/2018 01/18/2018  PHQ - 2 Score 0 0 0   THN CM Care Plan Problem One     Most Recent Value  Care Plan Problem One  Knowledge deficiet related to self care management of diabetes.  Role Documenting the Problem One  [place] for Problem One  Active  THN Long Term Goal   Patient will decrease Hgb A1C by 0.5 points within the next 60 days.  THN Long Term Goal Start Date  02/26/18  Interventions for Problem One Long Term Goal  Current care plan and goals reviewed and discussed, congratulated patient on her recent change in diet to help reduce her fasting blood sugars and A1C after attending medical appointment and recieving some diet education, encouraged to attend scheduled medical appointments, reviewed medications and encouraged medication compliance, placing [place] referral for medication assistance with [name], reviewed importance of lowering her A1C in general and for her upcoming surgery  THN CM Short Term Goal #1   Patient will attend Diabetes Education classes as scheduled in the next 60 days.  THN CM Short Term Goal #1 Start Date  02/26/18  Interventions for Short Term Goal #1  Discussed diabetic low carbohydrate diet with patient, confirmed patient is scheduled for the upcoming diabetes education classes at [facility] for the month of January, encouraged patient to attend scheduled courses, discussed importance of diabetes knowledge in managing and lowering A1C     Appointments:  Attended appointment with primary care provider, [name] NP on 01/18/2018 and has scheduled follow up  appointment on 03/02/2018.  Advanced Directives:  Denies having an advance directive in place.  States she has the information to create one and denies needing any assistance at this time.   Consent:  [facility] services reviewed and discussed with patient.  Patient verbally agrees to Disease Management outreaches and [place] referral.  Plan: [place] will send [facility] pharmacy referral for  [place] will send primary MD barriers letter. RN Health Coach will route initial telephone assessment note to primary MD. [place] will send patient [place]. RN Health Coach will send patient 2020 Calendar Booklet. RN Health Coach will make next telephone outreach to patient in the month of March.   [place] [phone] [email or web address]

## 2018-03-01 ENCOUNTER — Telehealth: Payer: Self-pay | Admitting: *Deleted

## 2018-03-01 ENCOUNTER — Other Ambulatory Visit: Payer: Self-pay | Admitting: Pharmacist

## 2018-03-01 NOTE — Patient Outreach (Addendum)
[place] [facility]) Care Management  [place]   03/01/2018  Marilouise Densmore September 04, 1950 [id number]  Reason for referral: Medication Assistance with Tresiba  Referral source: [place] (Diabetes) Current insurance:Health Team Advantage  PMHx includes but not limited to:  T2DM, HTN, HLD, allergic rhinitis, chronic knee and back pain  Outreach:  Successful telephone call with Ms. Lalley.  HIPAA identifiers verified. Patient requests that I call her later today after 2:00 PM.    Successful return call to Ms. Chamberlin this afternoon.  Patient reports she has been doing very well on [place] and Ozempic and blood sugars are much better controlled.  She reports she is willing to pay for them if needed but is hoping to find patient assistance options.  She has an appointment with PCP tomorrow and can discuss alternatives.     Objective: Lab Results  Component Value Date   CREATININE 0.81 01/29/2018    No results found for: HGBA1C  Lipid Panel  No results found for: CHOL, TRIG, HDL, CHOLHDL, VLDL, LDLCALC, LDLDIRECT  BP Readings from Last 3 Encounters:  01/29/18 126/72  01/18/18 120/67    Allergies  Allergen Reactions  . Aspirin Hives    Other reaction(s): Other (See Comments) Other Reaction: Not Assessed   . Insulin Detemir Hives and Rash    Errythema, edema, heat at site of injection did not improve after 2 weeks of use.    . Shellfish Allergy Anaphylaxis  . Dapagliflozin Other (See Comments)    Yeast infections Yeast infections   . Other     Other reaction(s): Other (See Comments) Uncoded Allergy. Allergen: Shellfish, Other Reaction: Not Assessed    Flu vaccine given 1998 caused anaphylaxis    Medications Reviewed Today    Reviewed by Leona Singleton, RN (Registered Nurse) on 02/26/18 at [place] List Status: <None>  Medication Order Taking? Sig Documenting Provider Last Dose Status Informant  atorvastatin (LIPITOR) 40 MG tablet [id number]  Yes Take 40 mg by mouth daily. [provider] Taking Active   Biotin 1000 MCG tablet [id number] Yes Take by mouth every other day.  [provider] Taking Active Self           Med Note Broadus John, Trude Mcburney   Tue Jan 19, 2018 11:31 AM) [name] every other day  Cholecalciferol (VITAMIN D3) 50 MCG (2000 UT) capsule [id number] Yes Take 2,000 Units by mouth daily.  [provider] Taking Active   Flax Oil-Fish Oil-Borage Oil CAPS [id number] Yes Take by mouth. [provider] Taking Active   glipiZIDE (GLUCOTROL XL) 2.5 MG 24 hr tablet [id number] Yes Take 2.5 mg by mouth daily with breakfast.  [provider] Taking Active            Med Note Broadus John, JANET S   Tue Jan 19, 2018 11:35 AM) [name] after breakfast but sometimes forgets once a week  ibuprofen (ADVIL,MOTRIN) 400 MG tablet [id number] Yes Take 400 mg by mouth every 6 (six) hours as needed.  [provider] Taking Active   insulin degludec (TRESIBA FLEXTOUCH) 100 UNIT/ML SOPN FlexTouch Pen [id number] Yes Inject 0.26 mLs (26 Units total) into the skin daily. [name], NP Taking Active   Insulin Pen Needle (PEN NEEDLES) 32G X 5 MM MISC [id number] Yes 1 Device by Does not apply route daily. [name], NP Taking Active   metFORMIN (GLUCOPHAGE-XR) 500 MG 24 hr tablet [id number] Yes Take 1,500 mg by mouth daily after supper.  [provider] Taking Active   Semaglutide,0.25 or 0.5MG/DOS, (OZEMPIC, 0.25 OR 0.5 MG/DOSE,) 2 MG/1.5ML SOPN [id number] Yes Inject 0.5 mg into the skin every 7 (seven) days. [name], NP Taking Active            Med Note Barrington Ellison   Tue Jan 19, 2018 11:40 AM) Started this morning 01/19/18  spironolactone (ALDACTONE) 25 MG tablet [id number] Yes Take 25 mg by mouth daily. [provider] Taking Active   vitamin B-12 (CYANOCOBALAMIN) 1000 MCG tablet [id number] Yes Take 1,000 mcg by mouth daily.  [provider] Taking Active             Medication Assistance Findings:  Medication assistance needs identified.   Ozempic (GLP-1 agonist)+ Tresiba (long-acting insulin)  Not eligible for Extra Help based on reported income.    Patient Assistance Programs: 1) Ozempic and Tresiba 100 unit/mL both made by [place] requirement met: _0  Yes _1  No _2  Unknown o Out-of-pocket prescription expenditure of $1000 met:    _3  Yes _4  No  _5  Unknown  <BOBOFPULGSPJSUNH>_9<\/VACQPEAKLTYVDPBA>_2  Not applicable - Alternative option is to substitute Trulicity and Engineer, agricultural and apply for patient assistance via [employer] which does not require out-of-pocket expenditure.  Plan: Will route note to PCP and follow-up with patient again within 3-4 business days  Ralene Bathe, PharmD, [place] [phone]

## 2018-03-01 NOTE — Telephone Encounter (Signed)
Received voice mail from patient that she would need to reschedule Diabetes classes that are starting this Thursday. She has a colonoscopy on Friday. Rescheduled class series beginning February 13.

## 2018-03-02 ENCOUNTER — Other Ambulatory Visit: Payer: Self-pay

## 2018-03-02 ENCOUNTER — Ambulatory Visit (INDEPENDENT_AMBULATORY_CARE_PROVIDER_SITE_OTHER): Payer: PPO | Admitting: Nurse Practitioner

## 2018-03-02 ENCOUNTER — Encounter: Payer: Self-pay | Admitting: Nurse Practitioner

## 2018-03-02 ENCOUNTER — Telehealth: Payer: Self-pay | Admitting: Pharmacist

## 2018-03-02 ENCOUNTER — Other Ambulatory Visit: Payer: Self-pay | Admitting: Pharmacy Technician

## 2018-03-02 VITALS — BP 122/64 | HR 79 | Temp 98.4°F | Ht 62.0 in | Wt 186.2 lb

## 2018-03-02 DIAGNOSIS — I10 Essential (primary) hypertension: Secondary | ICD-10-CM | POA: Diagnosis not present

## 2018-03-02 DIAGNOSIS — E1165 Type 2 diabetes mellitus with hyperglycemia: Secondary | ICD-10-CM | POA: Diagnosis not present

## 2018-03-02 DIAGNOSIS — Z794 Long term (current) use of insulin: Secondary | ICD-10-CM

## 2018-03-02 DIAGNOSIS — Z114 Encounter for screening for human immunodeficiency virus [HIV]: Secondary | ICD-10-CM | POA: Diagnosis not present

## 2018-03-02 DIAGNOSIS — Z1159 Encounter for screening for other viral diseases: Secondary | ICD-10-CM | POA: Diagnosis not present

## 2018-03-02 LAB — POCT UA - MICROALBUMIN: Microalbumin Ur, POC: 20 mg/L

## 2018-03-02 LAB — POCT GLYCOSYLATED HEMOGLOBIN (HGB A1C): Hemoglobin A1C: 8.4 % — AB (ref 4.0–5.6)

## 2018-03-02 MED ORDER — BASAGLAR KWIKPEN 100 UNIT/ML ~~LOC~~ SOPN: 26.0000 [IU] | PEN_INJECTOR | Freq: Every day | SUBCUTANEOUS | 11 refills | Status: DC

## 2018-03-02 MED ORDER — METFORMIN HCL ER 500 MG PO TB24: 1500.0000 mg | ORAL_TABLET | Freq: Every day | ORAL | 1 refills | Status: DC

## 2018-03-02 MED ORDER — ATORVASTATIN CALCIUM 40 MG PO TABS: 40.0000 mg | ORAL_TABLET | Freq: Every day | ORAL | 1 refills | Status: DC

## 2018-03-02 MED ORDER — SPIRONOLACTONE 25 MG PO TABS: 25.0000 mg | ORAL_TABLET | Freq: Every day | ORAL | 1 refills | Status: DC

## 2018-03-02 MED ORDER — DULAGLUTIDE 0.75 MG/0.5ML ~~LOC~~ SOAJ: 0.7500 mg | SUBCUTANEOUS | 11 refills | Status: DC

## 2018-03-02 NOTE — Patient Outreach (Signed)
[place] [facility]) Care Management  03/02/2018  Trista Ciocca 1950/05/11 [id number]                                                   Medication Assistance Referral  Referral From: [place]  Medication/Company: Humalog and Basaglar / Gean [place] Patient application portion:  Education officer, museum portion: Faxed  to [place], NP   Follow up:  Will follow up with patient in 5-7 business days to confirm application(s) have been received.  Maud Deed Chana Bode [place] Certified Pharmacy Technician [place] Management Direct Dial:[phone]

## 2018-03-02 NOTE — Patient Instructions (Addendum)
Summer Young,   Thank you for coming in to clinic today.  1. STOP Summer Young and START basaglar 26 units at bedtime when you get your medication  2. STOP Ozempic and START Trulicity one pen injection once weekly on Tuesdays.  3. Continue Glipizide in morning with breakfast.  May change metformin to breakfast.    4. START low fiber diet for colonoscopy today. - CLEAR liquid diet is Thursday.  5. Keep your yearly eye exam.  You will be due for [place].  This means you should eat no food or drink after midnight.  Drink only water or coffee without cream/sugar on the morning of your lab visit. - Please go ahead and schedule a "Lab Only" visit in the morning at the clinic for lab draw in the next 7 days. - Your results will be available about 2-3 days after blood draw.  If you have set up a MyChart account, you can can log in to MyChart online to view your results and a brief explanation. Also, we can discuss your results together at your next office visit if you would like.  Please schedule a follow-up appointment with [name], AGNP. Return in about 3 months (around 06/01/2018).  If you have any other questions or concerns, please feel free to call the clinic or send a message through [place]. You may also schedule an earlier appointment if necessary.  You will receive a survey after today's visit either digitally by e-mail or paper by [employer]. Your experiences and feedback matter to [place].  Please respond so we know how we are doing as we provide care for you.   [name], DNP, AGNP-BC Adult Gerontology Nurse Practitioner [place]

## 2018-03-02 NOTE — Patient Outreach (Signed)
[place] [facility]) Care Management  [place] 03/02/2018  Collene Massimino 03/02/1950 [id number]  Communication received from Dr. Merrilyn Puma approving substitution to Nancee Liter and Trulicity in order to apply for patient assistance program.    Plan: I will route patient assistance letter to [place] technician who will coordinate patient assistance program application process for medications listed above.  [facility] pharmacy technician will assist with obtaining all required documents from both patient and provider(s) and submit application(s) once completed.    Ralene Bathe, PharmD, [place] [phone]

## 2018-03-02 NOTE — Progress Notes (Signed)
Subjective:    Patient ID: Summer Young, female    DOB: 1950-08-03, 68 y.o.   MRN: [id number]  Summer Young is a 68 y.o. female presenting on 03/02/2018 for Diabetes   HPI Diabetes Pt presents today for follow up of Type 2 diabetes mellitus. She is checking fasting am CBG at home with a range of 90-125, ususally 90-115 over last 3 weeks.  Patient noted some hypoglycemia symptoms early with readings in am around 90.  Continues to feel slightly weak, but not having as severe of symptoms.  Patient treated symptoms by eating breakfast. - Current diabetic medications include: glipizide XL 2.5 mg once daily with breakfast, metformin XR 150 mg once daily with breakfast, Tresiba 26 units once daily, ozempic 0.5 mg once weekly.  Patient having difficulty with costs of Tresiba and Ozemipc.  [facility] pharmacist will work with patient to get Engineer, agricultural, Trulicity.  Patient had rash reaction in past to Lantus and is hesitant to try Basaglar. - She is not currently symptomatic.  Abdominal pain has fully resolved. - She denies polydipsia, polyphagia, polyuria, headaches, diaphoresis, shakiness, chills, pain, numbness or tingling in extremities and changes in vision.   - Clinical course has been improving. - She  reports an exercise routine that includes walking for 10-15 minutes, 2-3 days per week. - Her diet is moderate in salt, moderate in fat, and moderate in carbohydrates.  Patient has cut back on serving sizes.  [place]  - Weight trend: gradually decreasing  PREVENTION: Eye exam current (within one year): yes - [place] exam current (within one year): no - done today Lipid/ASCVD risk reduction - on statin: yes Kidney protection - on ace or arb: no - needs microalbumin  Recent Labs    03/02/18 0835  HGBA1C 8.4*   11/18/2017 = 9.2 07/16/2017 = 9.0 04/13/2017 = 9.3  Social History   Tobacco Use  . Smoking status: Never Smoker  . Smokeless tobacco: Never Used  Substance Use Topics  .  Alcohol use: Yes    Comment: occasional  . Drug use: Never    Review of Systems Per HPI unless specifically indicated above     Objective:    BP 122/64 (BP Location: Left Arm, Patient Position: Sitting, Cuff Size: Normal)   Pulse 79   Temp 98.4 F (36.9 C) (Oral)   Ht 5\' 2"  (1.575 m)   Wt 186 lb 3.2 oz (84.5 kg)   BMI 34.06 kg/m   Wt Readings from Last 3 Encounters:  03/02/18 186 lb 3.2 oz (84.5 kg)  01/29/18 190 lb 3.2 oz (86.3 kg)  01/18/18 194 lb 3.2 oz (88.1 kg)    Physical Exam Vitals signs reviewed.  Constitutional:      General: She is not in acute distress.    Appearance: She is well-developed.  HENT:     Head: Normocephalic and atraumatic.  Cardiovascular:     Rate and Rhythm: Normal rate and regular rhythm.     Pulses:          Radial pulses are 2+ on the right side and 2+ on the left side.       Posterior tibial pulses are 1+ on the right side and 1+ on the left side.     Heart sounds: Normal heart sounds, S1 normal and S2 normal.  Pulmonary:     Effort: Pulmonary effort is normal. No respiratory distress.     Breath sounds: Normal breath sounds and air entry.  Musculoskeletal:  Right lower leg: No edema.     Left lower leg: No edema.  Skin:    General: Skin is warm and dry.     Capillary Refill: Capillary refill takes less than 2 seconds.  Neurological:     Mental Status: She is alert and oriented to person, place, and time. Mental status is at baseline.  Psychiatric:        Attention and Perception: Attention normal.        Mood and Affect: Mood and affect normal.        Behavior: Behavior normal. Behavior is cooperative.        Thought Content: Thought content normal.        Judgment: Judgment normal.    Diabetic Foot Exam - Simple   Simple Foot Form Diabetic Foot exam was performed with the following findings:  Yes 03/02/2018  9:13 AM  Visual Inspection No deformities, no ulcerations, no other skin breakdown bilaterally:  Yes Sensation  Testing Intact to touch and monofilament testing bilaterally:  Yes Pulse Check Posterior Tibialis and Dorsalis pulse intact bilaterally:  Yes Comments    Results for orders placed or performed in visit on 01/28/18  Amylase  Result Value Ref Range   Amylase 38 21 - 101 U/L  Lipase  Result Value Ref Range   Lipase 18 7 - 60 U/L  COMPLETE METABOLIC PANEL WITH GFR  Result Value Ref Range   Glucose, Bld 171 (H) 65 - 99 mg/dL   BUN 13 7 - 25 mg/dL   Creat 0.81 0.50 - 0.99 mg/dL   GFR, Est Non African American 76 > OR = 60 mL/min/1.70m2   GFR, Est African American 88 > OR = 60 mL/min/1.79m2   BUN/Creatinine Ratio NOT APPLICABLE 6 - 22 (calc)   Sodium 138 135 - 146 mmol/L   Potassium 4.6 3.5 - 5.3 mmol/L   Chloride 104 98 - 110 mmol/L   CO2 24 20 - 32 mmol/L   Calcium 9.4 8.6 - 10.4 mg/dL   Total Protein 7.0 6.1 - 8.1 g/dL   Albumin 4.5 3.6 - 5.1 g/dL   Globulin 2.5 1.9 - 3.7 g/dL (calc)   AG Ratio 1.8 1.0 - 2.5 (calc)   Total Bilirubin 0.4 0.2 - 1.2 mg/dL   Alkaline phosphatase (APISO) 125 33 - 130 U/L   AST 20 10 - 35 U/L   ALT 18 6 - 29 U/L  CBC with Differential/Platelet  Result Value Ref Range   WBC 10.0 3.8 - 10.8 Thousand/uL   RBC 5.06 3.80 - 5.10 Million/uL   Hemoglobin 13.9 11.7 - 15.5 g/dL   HCT 41.5 35.0 - 45.0 %   MCV 82.0 80.0 - 100.0 fL   MCH 27.5 27.0 - 33.0 pg   MCHC 33.5 32.0 - 36.0 g/dL   RDW 13.2 11.0 - 15.0 %   Platelets 383 140 - 400 Thousand/uL   MPV 9.8 7.5 - 12.5 fL   Neutro Abs 6,100 1,500 - 7,800 cells/uL   Lymphs Abs 3,400 850 - 3,900 cells/uL   WBC mixed population 420 200 - 950 cells/uL   Eosinophils Absolute 60 15 - 500 cells/uL   Basophils Absolute 20 0 - 200 cells/uL   Neutrophils Relative % 61 %   Total Lymphocyte 34.0 %   Monocytes Relative 4.2 %   Eosinophils Relative 0.6 %   Basophils Relative 0.2 %      Assessment & Plan:   Problem List Items Addressed This Visit  Cardiovascular and Mediastinum   Essential  hypertension Patient needs refills on spironolactone - refills provided today.  Follow-up 3 months.   Relevant Medications   spironolactone (ALDACTONE) 25 MG tablet   atorvastatin (LIPITOR) 40 MG tablet   Other Relevant Orders   COMPLETE METABOLIC PANEL WITH GFR     Endocrine   Diabetes mellitus ([place]) - Primary Improving control with new medication regimen over last 6 weeks DM with A1c 8.4 today and rarely < 9.0% in last full year.  Goal A1c < 7.0%. - Complications - hyperglycemia.  Plan:  1. Change therapy:  - Continue glipizide for now.  Consider stopping at next visit, increasing Trulicity to 1.5 mg dose, reducing Basaglar as tolerated. - Change ozempic to Trulicity 0.17 mg once weekly - Change Tresiba to Basaglar 26 units once daily - monitor for rash and call clinic.  Sample provided today. 2. Encourage improved lifestyle: - low carb/low glycemic diet reinforced prior education - Increase physical activity to 30 minutes most days of the week.  Explained that increased physical activity increases body's use of sugar for energy. 3. Check fasting am CBG and bring log to next visit for review 4. Continue ASA and Statin  - Microalbumin today normal - no need for ACE/ARB at this time 5. DM Foot exam done today with normal findings.   and Advised to schedule DM ophtho exam, send record.6. Follow-up 3 months.   Relevant Medications   metFORMIN (GLUCOPHAGE-XR) 500 MG 24 hr tablet   Dulaglutide (TRULICITY) 7.93 JQ/3.0SP SOPN   Insulin Glargine (BASAGLAR KWIKPEN) 100 UNIT/ML SOPN   atorvastatin (LIPITOR) 40 MG tablet   Other Relevant Orders   POCT glycosylated hemoglobin (Hb A1C) (Completed)   POCT UA - Microalbumin (Completed)   Lipid panel   COMPLETE METABOLIC PANEL WITH GFR    Other Visit Diagnoses    Screening for HIV without presence of risk factors     Hep C Screening, low risk Patient due for HIV screening, Hep C screen due and patient agrees.   Relevant Orders   HIV  Antibody (routine testing w rflx)      Meds ordered this encounter  Medications  . metFORMIN (GLUCOPHAGE-XR) 500 MG 24 hr tablet    Sig: Take 3 tablets (1,500 mg total) by mouth daily with breakfast.    Dispense:  270 tablet    Refill:  1    Order Specific Question:   Supervising Provider    Answer:   Olin Hauser [[id number]]  . Dulaglutide (TRULICITY) 2.33 AQ/7.6AU SOPN    Sig: Inject 0.75 mg into the skin once a week.    Dispense:  4 pen    Refill:  11    Order Specific Question:   Supervising Provider    Answer:   Olin Hauser [[id number]]  . Insulin Glargine (BASAGLAR KWIKPEN) 100 UNIT/ML SOPN    Sig: Inject 0.26 mLs (26 Units total) into the skin at bedtime.    Dispense:  3 pen    Refill:  11    Order Specific Question:   Supervising Provider    Answer:   Olin Hauser [[id number]]  . spironolactone (ALDACTONE) 25 MG tablet    Sig: Take 1 tablet (25 mg total) by mouth daily.    Dispense:  90 tablet    Refill:  1    Order Specific Question:   Supervising Provider    Answer:   Olin Hauser [[id number]]  . atorvastatin (LIPITOR) 40 MG tablet  Sig: Take 1 tablet (40 mg total) by mouth daily.    Dispense:  90 tablet    Refill:  1    Order Specific Question:   Supervising Provider    Answer:   Olin Hauser [[id number]]    Follow up plan: Return in about 3 months (around 06/01/2018).  A total of 30 minutes was spent face-to-face with this patient. Greater than 50% of this time was spent in counseling and coordination of care with the patient regarding Diabetes, medication changes, lifestyle.    [name], DNP, AGPCNP-BC Adult Gerontology Primary Care Nurse Practitioner [facility] 03/02/2018, 8:35 AM

## 2018-03-03 ENCOUNTER — Other Ambulatory Visit: Payer: PPO

## 2018-03-03 DIAGNOSIS — E1165 Type 2 diabetes mellitus with hyperglycemia: Secondary | ICD-10-CM | POA: Diagnosis not present

## 2018-03-03 DIAGNOSIS — I10 Essential (primary) hypertension: Secondary | ICD-10-CM | POA: Diagnosis not present

## 2018-03-03 DIAGNOSIS — Z114 Encounter for screening for human immunodeficiency virus [HIV]: Secondary | ICD-10-CM | POA: Diagnosis not present

## 2018-03-03 DIAGNOSIS — Z794 Long term (current) use of insulin: Secondary | ICD-10-CM | POA: Diagnosis not present

## 2018-03-04 ENCOUNTER — Ambulatory Visit: Payer: Self-pay | Admitting: Pharmacist

## 2018-03-04 ENCOUNTER — Ambulatory Visit: Payer: PPO

## 2018-03-04 LAB — COMPLETE METABOLIC PANEL WITH GFR
AG Ratio: 1.7 (calc) (ref 1.0–2.5)
ALT: 19 U/L (ref 6–29)
AST: 18 U/L (ref 10–35)
Albumin: 4.1 g/dL (ref 3.6–5.1)
Alkaline phosphatase (APISO): 118 U/L (ref 33–130)
BUN: 12 mg/dL (ref 7–25)
CO2: 26 mmol/L (ref 20–32)
Calcium: 9.3 mg/dL (ref 8.6–10.4)
Chloride: 107 mmol/L (ref 98–110)
Creat: 0.87 mg/dL (ref 0.50–0.99)
GFR, Est African American: 81 mL/min/{1.73_m2} (ref >=60)
GFR, Est Non African American: 70 mL/min/{1.73_m2} (ref >=60)
Globulin: 2.4 g/dL (calc) (ref 1.9–3.7)
Glucose, Bld: 91 mg/dL (ref 65–99)
Potassium: 4.8 mmol/L (ref 3.5–5.3)
Sodium: 141 mmol/L (ref 135–146)
Total Bilirubin: 0.5 mg/dL (ref 0.2–1.2)
Total Protein: 6.5 g/dL (ref 6.1–8.1)

## 2018-03-04 LAB — LIPID PANEL
Cholesterol: 100 mg/dL (ref <200)
HDL: 46 mg/dL — ABNORMAL LOW (ref >50)
LDL Cholesterol (Calc): 39 mg/dL (calc)
Non-HDL Cholesterol (Calc): 54 mg/dL (calc) (ref <130)
Total CHOL/HDL Ratio: 2.2 (calc) (ref <5.0)
Triglycerides: 73 mg/dL (ref <150)

## 2018-03-04 LAB — HIV ANTIBODY (ROUTINE TESTING W REFLEX): HIV 1&2 Ab, 4th Generation: NONREACTIVE

## 2018-03-04 LAB — HEPATITIS C ANTIBODY
Hepatitis C Ab: NONREACTIVE
SIGNAL TO CUT-OFF: 0.01 (ref <1.00)

## 2018-03-05 DIAGNOSIS — E119 Type 2 diabetes mellitus without complications: Secondary | ICD-10-CM | POA: Diagnosis not present

## 2018-03-05 DIAGNOSIS — D128 Benign neoplasm of rectum: Secondary | ICD-10-CM | POA: Diagnosis not present

## 2018-03-05 DIAGNOSIS — K648 Other hemorrhoids: Secondary | ICD-10-CM | POA: Diagnosis not present

## 2018-03-05 DIAGNOSIS — K635 Polyp of colon: Secondary | ICD-10-CM | POA: Diagnosis not present

## 2018-03-05 DIAGNOSIS — K6389 Other specified diseases of intestine: Secondary | ICD-10-CM | POA: Diagnosis not present

## 2018-03-05 DIAGNOSIS — D120 Benign neoplasm of cecum: Secondary | ICD-10-CM | POA: Diagnosis not present

## 2018-03-05 DIAGNOSIS — K621 Rectal polyp: Secondary | ICD-10-CM | POA: Diagnosis not present

## 2018-03-05 DIAGNOSIS — D125 Benign neoplasm of sigmoid colon: Secondary | ICD-10-CM | POA: Diagnosis not present

## 2018-03-05 DIAGNOSIS — Z1211 Encounter for screening for malignant neoplasm of colon: Secondary | ICD-10-CM | POA: Diagnosis not present

## 2018-03-05 DIAGNOSIS — K5730 Diverticulosis of large intestine without perforation or abscess without bleeding: Secondary | ICD-10-CM | POA: Diagnosis not present

## 2018-03-05 LAB — HM COLONOSCOPY

## 2018-03-09 ENCOUNTER — Other Ambulatory Visit: Payer: Self-pay | Admitting: Pharmacy Technician

## 2018-03-09 NOTE — Patient Outreach (Signed)
[facility] [facility]) Care Management  03/09/2018  Summer Young 1950/12/30 [id number]    Successful call placed to patient regarding patient assistance application(s) for Trulicity and Basaglar , HIPAA identifiers verified. [name] confirms that she received the application and she is over income. Suggested she save my number and call me if she runs into any issues in the future.  Will route note to [place] for case closure.  Maud Deed Chana Bode [place] Certified Pharmacy Technician [place] Management Direct Dial:[phone]

## 2018-03-11 ENCOUNTER — Encounter: Payer: Self-pay | Admitting: Nurse Practitioner

## 2018-03-11 ENCOUNTER — Ambulatory Visit: Payer: PPO

## 2018-03-15 ENCOUNTER — Encounter: Payer: Self-pay | Admitting: Nurse Practitioner

## 2018-03-15 DIAGNOSIS — Z794 Long term (current) use of insulin: Principal | ICD-10-CM

## 2018-03-15 DIAGNOSIS — E1165 Type 2 diabetes mellitus with hyperglycemia: Secondary | ICD-10-CM

## 2018-03-15 MED ORDER — INSULIN DEGLUDEC 100 UNIT/ML ~~LOC~~ SOPN: 26.0000 [IU] | PEN_INJECTOR | Freq: Every day | SUBCUTANEOUS | 5 refills | Status: DC

## 2018-03-15 MED ORDER — SEMAGLUTIDE(0.25 OR 0.5MG/DOS) 2 MG/1.5ML ~~LOC~~ SOPN: 0.5000 mg | PEN_INJECTOR | SUBCUTANEOUS | 5 refills | Status: DC

## 2018-03-18 ENCOUNTER — Ambulatory Visit: Payer: PPO

## 2018-04-01 ENCOUNTER — Encounter: Payer: PPO | Attending: Nurse Practitioner | Admitting: Dietician

## 2018-04-01 ENCOUNTER — Encounter: Payer: Self-pay | Admitting: Dietician

## 2018-04-01 VITALS — Ht 62.0 in | Wt 183.2 lb

## 2018-04-01 DIAGNOSIS — Z794 Long term (current) use of insulin: Secondary | ICD-10-CM | POA: Diagnosis not present

## 2018-04-01 DIAGNOSIS — Z713 Dietary counseling and surveillance: Secondary | ICD-10-CM | POA: Diagnosis not present

## 2018-04-01 DIAGNOSIS — E1165 Type 2 diabetes mellitus with hyperglycemia: Secondary | ICD-10-CM | POA: Diagnosis not present

## 2018-04-01 DIAGNOSIS — E119 Type 2 diabetes mellitus without complications: Secondary | ICD-10-CM

## 2018-04-01 NOTE — Progress Notes (Signed)

## 2018-04-08 ENCOUNTER — Encounter: Payer: PPO | Admitting: Dietician

## 2018-04-08 ENCOUNTER — Encounter: Payer: Self-pay | Admitting: Dietician

## 2018-04-08 VITALS — Wt 180.3 lb

## 2018-04-08 DIAGNOSIS — E119 Type 2 diabetes mellitus without complications: Secondary | ICD-10-CM

## 2018-04-08 DIAGNOSIS — Z794 Long term (current) use of insulin: Principal | ICD-10-CM

## 2018-04-08 DIAGNOSIS — Z713 Dietary counseling and surveillance: Secondary | ICD-10-CM | POA: Diagnosis not present

## 2018-04-08 NOTE — Progress Notes (Signed)

## 2018-04-13 ENCOUNTER — Encounter: Payer: Self-pay | Admitting: Nurse Practitioner

## 2018-04-13 ENCOUNTER — Ambulatory Visit (INDEPENDENT_AMBULATORY_CARE_PROVIDER_SITE_OTHER): Payer: PPO | Admitting: Nurse Practitioner

## 2018-04-13 ENCOUNTER — Other Ambulatory Visit: Payer: Self-pay

## 2018-04-13 VITALS — BP 130/77 | HR 81 | Temp 98.3°F | Ht 62.0 in | Wt 180.2 lb

## 2018-04-13 DIAGNOSIS — K5901 Slow transit constipation: Secondary | ICD-10-CM

## 2018-04-13 DIAGNOSIS — R140 Abdominal distension (gaseous): Secondary | ICD-10-CM

## 2018-04-13 DIAGNOSIS — R1084 Generalized abdominal pain: Secondary | ICD-10-CM | POA: Diagnosis not present

## 2018-04-13 NOTE — Progress Notes (Signed)
Subjective:    Patient ID: Summer Young, female    DOB: [birth year], 68 y.o.   MRN: [id number]  Summer Young is a 68 y.o. female presenting on 04/13/2018 for Abdominal Pain (constant abdominal pain, fatigue, acid reflux and gas. The symptoms started 6 days ago after eating stuff peppers. pt concern it could be her diverticulitis)   HPI Abdominal Pain Patient reports new symptoms of abdominal pain that began about 6 days ago.  Patient now with constant abdominal pain, fatigue, reflux, gas, and bloating.  Food triggered symptoms was stuffed peppers.  Since onset, pain has improved slightly today.  Yesterday was most severe. - Patient notes fatigue, but has had insomnia.  - Patient has not taken any OTC medicines for her symptoms. - Patient has not been having regular BM.  Is only having small BM when going to the bathroom.  Patient does note changes in diet with diabetes class recommendations.  - Patient has had bowel blockage in past.  Miralax helped symptoms resolved.  Social History   Tobacco Use  . Smoking status: Never Smoker  . Smokeless tobacco: Never Used  Substance Use Topics  . Alcohol use: Yes    Alcohol/week: 0.0 - 1.0 standard drinks    Comment: occasional  . Drug use: Never    Review of Systems Per HPI unless specifically indicated above     Objective:    BP 130/77 (BP Location: Right Arm, Patient Position: Sitting, Cuff Size: Normal)   Pulse 81   Temp 98.3 F (36.8 C) (Oral)   Ht 5\' 2"  (1.575 m)   Wt 180 lb 3.2 oz (81.7 kg)   BMI 32.96 kg/m   Wt Readings from Last 3 Encounters:  04/13/18 180 lb 3.2 oz (81.7 kg)  04/08/18 180 lb 4.8 oz (81.8 kg)  04/01/18 183 lb 3.2 oz (83.1 kg)    Physical Exam Vitals signs reviewed.  Constitutional:      General: She is not in acute distress.    Appearance: She is well-developed.  HENT:     Head: Normocephalic and atraumatic.  Cardiovascular:     Rate and Rhythm: Normal rate and regular rhythm.     Pulses:        Radial pulses are 2+ on the right side and 2+ on the left side.       Posterior tibial pulses are 1+ on the right side and 1+ on the left side.     Heart sounds: Normal heart sounds, S1 normal and S2 normal.  Pulmonary:     Effort: Pulmonary effort is normal. No respiratory distress.     Breath sounds: Normal breath sounds and air entry.  Abdominal:     General: Bowel sounds are normal. There is no distension.     Palpations: Abdomen is soft.     Tenderness: There is no abdominal tenderness.     Hernia: No hernia is present.  Musculoskeletal:     Right lower leg: No edema.     Left lower leg: No edema.  Skin:    General: Skin is warm and dry.     Capillary Refill: Capillary refill takes less than 2 seconds.  Neurological:     Mental Status: She is alert and oriented to person, place, and time.  Psychiatric:        Attention and Perception: Attention normal.        Mood and Affect: Mood and affect normal.        Behavior: Behavior normal.  Behavior is cooperative.    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  Resolving today.  Patient with history of ileus and similar, but less severe symptoms at that time. No concern today for ileus or complications.  Plan: 1. Control constipation, increase BM - May use miralax for next 7 days.  Then, Start 1/2 dose daily of citrucel or metamucil for fiber/bulk-forming laxative. 2. START colace OTC 100-200 mg once daily prn if no BM for 2 days. Take daily until successful BM 3. Increase water consumption to at  least 72 oz per day. 4. May start GasEx for increased gas. 5. Follow-up prn if symptoms do not improve  Follow up plan: Return if symptoms worsen or fail to improve.  [name], DNP, AGPCNP-BC Adult Gerontology Primary Care Nurse Practitioner [facility] 04/13/2018, 9:58 AM

## 2018-04-13 NOTE — Patient Instructions (Addendum)
Summer Young,   Thank you for coming in to clinic today.  1. Increase water (72 oz per day) and fiber to improve constipation.   - START 1/2 dose daily of Citrucel or Metamucil.   - If worsening your pain, change to Miralax  - May also start docusate sodium (Colace) over the counter daily as needed for bowel movement in 2 days, take this medicine until you have a BM.  This is a stool softener.  - For increased gas, may use GasEx as needed.  2. Continue annual eye exams - due November.  Check for reading acuity. Your provider would like to you have your annual eye exam. Please contact your current eye doctor or here are some good options for you to contact.   [facility]   Address: [address] [place], [place] [address] Phone: [phone]  Website: [email or web address] [address] [place], [place] [address] Phone: [phone] [email or web address]  [facility]  Address: [place], [place], [place] [address] Phone: [phone]   [facility] [address] [place], [place] [address] Phone: [phone]  [facility] Address: [place], [place], [place] [address]  Phone: [phone]  Please schedule a follow-up appointment with [name], AGNP. Return if symptoms worsen or fail to improve.  If you have any other questions or concerns, please feel free to call the clinic or send a message through [place]. You may also schedule an earlier appointment if necessary.  You will receive a survey after today's visit either digitally by e-mail or paper by [employer]. Your experiences and feedback matter to [place].  Please respond so we know how we are doing as we provide care for you.   [name], DNP, AGNP-BC Adult Gerontology Nurse Practitioner [place]

## 2018-04-15 ENCOUNTER — Encounter: Payer: Self-pay | Admitting: Dietician

## 2018-04-15 ENCOUNTER — Encounter: Payer: PPO | Admitting: Dietician

## 2018-04-15 VITALS — BP 140/86 | Ht 62.0 in | Wt 178.7 lb

## 2018-04-15 DIAGNOSIS — Z794 Long term (current) use of insulin: Principal | ICD-10-CM

## 2018-04-15 DIAGNOSIS — Z713 Dietary counseling and surveillance: Secondary | ICD-10-CM | POA: Diagnosis not present

## 2018-04-15 DIAGNOSIS — E119 Type 2 diabetes mellitus without complications: Secondary | ICD-10-CM

## 2018-04-15 NOTE — Progress Notes (Signed)

## 2018-04-16 ENCOUNTER — Encounter: Payer: Self-pay | Admitting: Nurse Practitioner

## 2018-04-19 ENCOUNTER — Encounter: Payer: Self-pay | Admitting: *Deleted

## 2018-05-05 ENCOUNTER — Encounter: Payer: Self-pay | Admitting: *Deleted

## 2018-05-05 ENCOUNTER — Other Ambulatory Visit: Payer: Self-pay

## 2018-05-05 ENCOUNTER — Other Ambulatory Visit: Payer: Self-pay | Admitting: *Deleted

## 2018-05-05 NOTE — Patient Outreach (Signed)
[facility]) Care Management  [place]  05/05/2018   Agness Sibrian June 14, 1950 [id number]   [place] Quarterly Outreach  Referral Date:  01/19/2018 Referral Source:  Provider Office Reason for Referral:  Disease Management Education Insurance:  [employer]   Outreach Attempt:  Successful telephone outreach with patient for follow up.  HIPAA verified with patient.  Patient reporting she is doing well.  Fasting blood sugar this morning was 115 with fasting ranges of 85-105.  Recent Hgb A1C was decreased to 8.4.  States she has cancelled her knee surgery due to the state of healthcare at this moment and will reschedule once virus cleared and her A1C is decreased to 7.  Patient stating her recent abdominal pain and constipation has been resolved.  Encounter Medications:  Outpatient Encounter Medications as of 05/05/2018  Medication Sig Note  . atorvastatin (LIPITOR) 40 MG tablet Take 1 tablet (40 mg total) by mouth daily.   . Biotin 1000 MCG tablet Take by mouth every other day.  01/19/2018: Takes every other day  . Cholecalciferol (VITAMIN D3) 50 MCG (2000 UT) capsule Take 2,000 Units by mouth daily.    . Flax Oil-Fish Oil-Borage Oil CAPS Take by mouth.   Marland Kitchen glipiZIDE (GLUCOTROL XL) 2.5 MG 24 hr tablet Take 2.5 mg by mouth daily with breakfast.  01/19/2018: Takes after breakfast but sometimes forgets once a week  . ibuprofen (ADVIL,MOTRIN) 400 MG tablet Take 400 mg by mouth every 6 (six) hours as needed.    . insulin degludec (TRESIBA FLEXTOUCH) 100 UNIT/ML SOPN FlexTouch Pen Inject 0.26 mLs (26 Units total) into the skin daily.   . Insulin Pen Needle (PEN NEEDLES) 32G X 5 MM MISC 1 Device by Does not apply route daily.   . metFORMIN (GLUCOPHAGE-XR) 500 MG 24 hr tablet Take 3 tablets (1,500 mg total) by mouth daily with breakfast.   . Semaglutide,0.25 or 0.'5MG'$ /DOS, (OZEMPIC, 0.25 OR 0.5 MG/DOSE,) 2 MG/1.5ML SOPN Inject 0.5 mg into the skin every 7  (seven) days.   Marland Kitchen spironolactone (ALDACTONE) 25 MG tablet Take 1 tablet (25 mg total) by mouth daily.   . vitamin B-12 (CYANOCOBALAMIN) 1000 MCG tablet Take 1,000 mcg by mouth daily.     No facility-administered encounter medications on file as of 05/05/2018.     Functional Status:  In your present state of health, do you have any difficulty performing the following activities: 02/26/2018  Hearing? N  Vision? N  Difficulty concentrating or making decisions? N  Walking or climbing stairs? N  Dressing or bathing? N  Doing errands, shopping? N  Preparing Food and eating ? N  Using the Toilet? N  In the past six months, have you accidently leaked urine? Y  Do you have problems with loss of bowel control? N  Managing your Medications? N  Managing your Finances? N  Housekeeping or managing your Housekeeping? N    Fall/Depression Screening: Fall Risk  05/05/2018 04/15/2018 04/08/2018  Falls in the past year? 0 0 (No Data)  Comment - - no falls since last visit  Follow up Falls evaluation completed;Falls prevention discussed;Education provided - -   PHQ 2/9 Scores 02/26/2018 01/29/2018 01/18/2018  PHQ - 2 Score 0 0 0   THN CM Care Plan Problem One     Most Recent Value  Care Plan Problem One  Knowledge deficiet related to self care management of diabetes.  Role Documenting the Problem One  [place] for Problem  One  Active  THN Long Term Goal   Patient will decrease Hgb A1C by 0.5 points within the next 60 days.  THN Long Term Goal Start Date  05/05/18  THN Long Term Goal Met Date  05/05/18  Interventions for Problem One Long Term Goal  Reviewed and discussed current care plan and goals, congratulated patient on reduction og her Hgb A1C down to 8.4, congratulated patient on attending diabetes and nutrition classes, discussd and encouraged healthier food and drink options, encouraged to attend scheduled medical appointments, reviewed medications nd encouraged medication  compliance, encouraged to increase activigy as tolerated discussed knee surgery after reduction of Hgb A1C to at least 70  THN CM Short Term Goal #1   Patient will attend Diabetes Education classes as scheduled in the next 60 days.  THN CM Short Term Goal #1 Start Date  02/26/18  [facility] CM Short Term Goal #1 Met Date  05/05/18     Appointments: Attended appointment with primary care provider [name] NP on 04/13/2018 and has scheduled follow up appointment on 06/04/2018.  Plan: RN Health Coach will resend [place]. RN Health Coach will resend 2020 Calendar Booklet. RN Health Coach will send primary care provider quarterly update. RN Health Coach will make next telephone outreach to patient within the month of June.  [place] [phone] [email or web address]

## 2018-06-04 ENCOUNTER — Ambulatory Visit (INDEPENDENT_AMBULATORY_CARE_PROVIDER_SITE_OTHER): Payer: PPO | Admitting: Nurse Practitioner

## 2018-06-04 ENCOUNTER — Other Ambulatory Visit: Payer: Self-pay

## 2018-06-04 ENCOUNTER — Encounter: Payer: Self-pay | Admitting: Nurse Practitioner

## 2018-06-04 DIAGNOSIS — E785 Hyperlipidemia, unspecified: Secondary | ICD-10-CM

## 2018-06-04 DIAGNOSIS — Z794 Long term (current) use of insulin: Secondary | ICD-10-CM | POA: Diagnosis not present

## 2018-06-04 DIAGNOSIS — E1169 Type 2 diabetes mellitus with other specified complication: Secondary | ICD-10-CM | POA: Diagnosis not present

## 2018-06-04 DIAGNOSIS — Z9189 Other specified personal risk factors, not elsewhere classified: Secondary | ICD-10-CM

## 2018-06-04 DIAGNOSIS — E1165 Type 2 diabetes mellitus with hyperglycemia: Secondary | ICD-10-CM

## 2018-06-04 DIAGNOSIS — I10 Essential (primary) hypertension: Secondary | ICD-10-CM | POA: Diagnosis not present

## 2018-06-04 MED ORDER — ATORVASTATIN CALCIUM 40 MG PO TABS: 40.0000 mg | ORAL_TABLET | Freq: Every day | ORAL | 1 refills | Status: DC

## 2018-06-04 MED ORDER — INSULIN DEGLUDEC 100 UNIT/ML ~~LOC~~ SOPN: 26.0000 [IU] | PEN_INJECTOR | Freq: Every day | SUBCUTANEOUS | 5 refills | Status: DC

## 2018-06-04 MED ORDER — SPIRONOLACTONE 25 MG PO TABS: 25.0000 mg | ORAL_TABLET | Freq: Every day | ORAL | 1 refills | Status: DC

## 2018-06-04 MED ORDER — GLIPIZIDE ER 2.5 MG PO TB24: 2.5000 mg | ORAL_TABLET | Freq: Every day | ORAL | 1 refills | Status: DC

## 2018-06-04 MED ORDER — SEMAGLUTIDE(0.25 OR 0.5MG/DOS) 2 MG/1.5ML ~~LOC~~ SOPN: 0.5000 mg | PEN_INJECTOR | SUBCUTANEOUS | 5 refills | Status: DC

## 2018-06-04 MED ORDER — METFORMIN HCL ER 500 MG PO TB24: 1500.0000 mg | ORAL_TABLET | Freq: Every day | ORAL | 1 refills | Status: DC

## 2018-06-04 MED ORDER — PEN NEEDLES 32G X 5 MM MISC: 1.0000 | Freq: Every day | 3 refills | Status: DC

## 2018-06-04 NOTE — Progress Notes (Signed)
Telemedicine Encounter: Disclosed to patient at start of encounter that we will provide appropriate telemedicine services.  Patient consents to be treated via phone prior to discussion. - Patient is at her home and is accessed via telephone. - Services are provided by [name] from [facility].  Subjective:    Patient ID: Summer Young, female    DOB: Apr 09, 1950, 68 y.o.   MRN: [id number]  Summer Young is a 68 y.o. female presenting on 06/04/2018 for Diabetes (blood sugar 94 fasting, 185 after dinner. Pt think you might have gain weight since staying in the house) and Anxiety (pt state shes develop a phobia since the pandemic )  HPI Diabetes Pt presents today for follow up of Type 2 diabetes mellitus. She is checking fasting am CBG at home with a range of LOW: 84; HIGH: 130; last 2 weeks 105-120 - Current diabetic medications include: metformin ER 1500 mg daily, glipizideXL 2.5 mg daily, ozempic 0.5 mg weekly, Tresiba 26 units daily - She is not currently symptomatic.  - She denies polydipsia, polyphagia, polyuria, headaches, diaphoresis, shakiness, chills, pain, numbness or tingling in extremities and changes in vision.   - Clinical course has been improving. - She  reports an exercise routine that includes walking, 4-5 days per week. - Her diet is moderate in salt, moderate in fat, and moderate in carbohydrates. - Weight trend: stable - Patient feels stable to slightly increased after being home  PREVENTION: Eye exam current (within one year): yes Foot exam current (within one year): yes  Lipid/ASCVD risk reduction - on statin: yes Kidney protection - on ace or arb: no - needs urine Recent Labs    03/02/18 0835  HGBA1C 8.4*   Anxiety Mild anxiety.  Has 2 sisters in healthcare in [place] and is "on edge a little."  Only increases when she talks to them and lasts only a few minutes.  GAD 7 : Generalized Anxiety Score 06/04/2018  Nervous, Anxious, on Edge 0   Control/stop worrying 0  Worry too much - different things 0  Trouble relaxing 0  Restless 0  Easily annoyed or irritable 0  Afraid - awful might happen 1  Total GAD 7 Score 1  Anxiety Difficulty Not difficult at all   Depression screen [facility] 2/9 02/26/2018 01/29/2018 01/18/2018  Decreased Interest 0 0 0  Down, Depressed, Hopeless 0 0 0  PHQ - 2 Score 0 0 0   Social History   Tobacco Use  . Smoking status: Never Smoker  . Smokeless tobacco: Never Used  Substance Use Topics  . Alcohol use: Yes    Alcohol/week: 0.0 - 1.0 standard drinks    Comment: occasional  . Drug use: Never   Review of Systems Per HPI unless specifically indicated above    Objective:    There were no vitals taken for this visit.  Last home weight: no scale  Wt Readings from Last 3 Encounters:  04/15/18 178 lb 11.2 oz (81.1 kg)  04/13/18 180 lb 3.2 oz (81.7 kg)  04/08/18 180 lb 4.8 oz (81.8 kg)    Physical Exam Patient remotely monitored.  Verbal communication appropriate.  Cognition normal.   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  Current control is unknown.  Recheck labs.  Encouraged healthy lifestyle for regular exercise and heart healthy diet.  FOLLOW-UP 3 months.    # T2DM Stable and improved per patient home fasting CBG readings. Goal A1c < 7.0%. - Complications - mixed dyslipidemia and hyperglycemia.  Plan:  1. Continue current therapy: no changes to metformin, glipizide,  Tresiba, or Ozempic today.  - Dependent upon A1c, May consider reducing/stopping glipizide and/OR increasing Ozempic to 1.0 mg weekly. 2. Encourage improved lifestyle: - low carb/low glycemic diet reinforced prior education - Increase physical activity to 30 minutes most days of the week.  Explained that increased physical activity increases body's use of sugar for energy. 3. Check fasting am CBG and bring log to next visit for review.  Patient has relion meter.  Is not aware of her current medicare advantage meter/strip coverage.  Will address at her next visit due to Covid-19   4. Continue ASA and Statin  - Needs urine microalbumin, may need to start ACE/ARB - patient aware 5. Labs in next 1 week 6. Follow-up 3 months.   # At risk for anxiety Patient with heightened anxiety about the safety of her sisters.  Currently well managed without medications and without anxiety diagnosis.  Coping skills are currently adequate.  No concerns about disordered anxiety response at this time.  Meds ordered this encounter  Medications  . atorvastatin (LIPITOR) 40 MG tablet    Sig: Take 1 tablet (40 mg total) by mouth daily.    Dispense:  90 tablet    Refill:  1    Order Specific Question:   Supervising Provider    Answer:   Olin Hauser [[id number]]  . glipiZIDE (GLUCOTROL XL) 2.5 MG 24 hr tablet    Sig: Take 1 tablet (2.5 mg total) by mouth daily with breakfast.    Dispense:  90 tablet    Refill:  1    Order Specific Question:   Supervising Provider    Answer:   Olin Hauser [[id number]]  . insulin degludec (TRESIBA FLEXTOUCH) 100 UNIT/ML SOPN FlexTouch Pen    Sig: Inject 0.26 mLs (26 Units total) into the skin daily.    Dispense:  3 pen    Refill:  5    Order Specific Question:   Supervising Provider    Answer:   Olin Hauser [[id number]]  . Insulin Pen Needle (PEN NEEDLES) 32G X 5 MM MISC    Sig: 1 Device by Does not apply route daily.    Dispense:  100 each    Refill:  3     Order Specific Question:   Supervising Provider    Answer:   Olin Hauser [[id number]]  . metFORMIN (GLUCOPHAGE-XR) 500 MG 24 hr tablet    Sig: Take 3 tablets (1,500 mg total) by mouth daily with breakfast.    Dispense:  270 tablet    Refill:  1    Order Specific Question:   Supervising Provider    Answer:   Olin Hauser [[id number]]  . Semaglutide,0.25 or 0.5MG /DOS, (OZEMPIC, 0.25 OR 0.5 MG/DOSE,) 2 MG/1.5ML SOPN    Sig: Inject 0.5 mg into the skin  every 7 (seven) days.    Dispense:  2 pen    Refill:  5    Order Specific Question:   Supervising Provider    Answer:   Olin Hauser [[id number]]  . spironolactone (ALDACTONE) 25 MG tablet    Sig: Take 1 tablet (25 mg total) by mouth daily.    Dispense:  90 tablet    Refill:  1    Order Specific Question:   Supervising Provider    Answer:   Olin Hauser [[id number]]   - Time spent in direct consultation with patient via telemedicine about above concerns: 15 minutes  Follow up plan: Return in about 3 months (around 09/03/2018) for diabetes, LABS next week.  [name], DNP, AGPCNP-BC Adult Gerontology Primary Care Nurse Practitioner [facility] 06/04/2018, 8:11 AM

## 2018-06-09 ENCOUNTER — Other Ambulatory Visit: Payer: PPO

## 2018-06-10 DIAGNOSIS — E785 Hyperlipidemia, unspecified: Secondary | ICD-10-CM | POA: Diagnosis not present

## 2018-06-10 DIAGNOSIS — E1165 Type 2 diabetes mellitus with hyperglycemia: Secondary | ICD-10-CM | POA: Diagnosis not present

## 2018-06-10 DIAGNOSIS — Z794 Long term (current) use of insulin: Secondary | ICD-10-CM | POA: Diagnosis not present

## 2018-06-10 DIAGNOSIS — E1169 Type 2 diabetes mellitus with other specified complication: Secondary | ICD-10-CM | POA: Diagnosis not present

## 2018-06-11 ENCOUNTER — Telehealth: Payer: Self-pay | Admitting: Family Medicine

## 2018-06-11 DIAGNOSIS — E1165 Type 2 diabetes mellitus with hyperglycemia: Secondary | ICD-10-CM

## 2018-06-11 LAB — MICROALBUMIN, URINE: Microalb, Ur: 0.8 mg/dL

## 2018-06-11 LAB — HEMOGLOBIN A1C
Hgb A1c MFr Bld: 8.1 % of total Hgb — ABNORMAL HIGH (ref <5.7)
Mean Plasma Glucose: 186 (calc)
eAG (mmol/L): 10.3 (calc)

## 2018-06-11 LAB — COMPLETE METABOLIC PANEL WITHOUT GFR
AG Ratio: 1.7 (calc) (ref 1.0–2.5)
ALT: 20 U/L (ref 6–29)
AST: 18 U/L (ref 10–35)
Albumin: 4.2 g/dL (ref 3.6–5.1)
Alkaline phosphatase (APISO): 129 U/L (ref 37–153)
BUN: 11 mg/dL (ref 7–25)
CO2: 23 mmol/L (ref 20–32)
Calcium: 9.1 mg/dL (ref 8.6–10.4)
Chloride: 105 mmol/L (ref 98–110)
Creat: 0.79 mg/dL (ref 0.50–0.99)
GFR, Est African American: 91 mL/min/1.73m2
GFR, Est Non African American: 79 mL/min/1.73m2
Globulin: 2.5 g/dL (ref 1.9–3.7)
Glucose, Bld: 150 mg/dL — ABNORMAL HIGH (ref 65–99)
Potassium: 4.8 mmol/L (ref 3.5–5.3)
Sodium: 140 mmol/L (ref 135–146)
Total Bilirubin: 0.5 mg/dL (ref 0.2–1.2)
Total Protein: 6.7 g/dL (ref 6.1–8.1)

## 2018-06-11 LAB — LIPID PANEL
Cholesterol: 111 mg/dL (ref <200)
HDL: 39 mg/dL — ABNORMAL LOW (ref >=50)
LDL Cholesterol (Calc): 53 mg/dL (calc)
Non-HDL Cholesterol (Calc): 72 mg/dL (calc) (ref <130)
Total CHOL/HDL Ratio: 2.8 (calc) (ref <5.0)
Triglycerides: 102 mg/dL (ref <150)

## 2018-06-11 MED ORDER — SEMAGLUTIDE (1 MG/DOSE) 2 MG/1.5ML ~~LOC~~ SOPN: 1.0000 mg | PEN_INJECTOR | SUBCUTANEOUS | 1 refills | Status: DC

## 2018-06-11 NOTE — Telephone Encounter (Signed)
Covering inbox for [name], AGPCNP-BC while she is out of office on maternity.  Patient was last seen by PCP on 06/04/18- she ordered labs to be drawn this week  I have reviewed the labs and released result to patient mychart.  Called patient discussed, she was very pleased and appreciative.  Her A1c is improved to 8.1 from 8.4  I reviewed all of her printed emails with glucose readings, mostly normal fasting cbg.  Reviewed PCP note and my own recommendations w/ patient:  1. DISCONTINUE Glipizide  2. INCREASE Ozempic from 0.5mg  weekly up to 1mg  weekly - Use existing pens, x 2 for 1 dose - Sent new rx Ozempic 1mg  pen - to pharmacy 90 day  3. REDUCE Tresiba insulin - down from 26u daily down to 22 units daily - Continue to gradually decrease by 2 units every 7 days if fasting blood sugar in morning is on average < 130 - If any sudden low sugars that are severe or new concerns, call office and we can adjust this faster if need  Nobie Putnam, [facility] 06/11/2018, 5:04 PM

## 2018-06-24 ENCOUNTER — Encounter: Payer: Self-pay | Admitting: Nurse Practitioner

## 2018-07-22 ENCOUNTER — Encounter: Payer: Self-pay | Admitting: Nurse Practitioner

## 2018-07-26 ENCOUNTER — Other Ambulatory Visit: Payer: Self-pay | Admitting: *Deleted

## 2018-07-26 NOTE — Patient Outreach (Signed)
[facility]) Care Management  07/26/2018  Preslea Rhodus 12-20-1950 [id number]   RN Health Coach Quarterly Outreach  Referral Date:01/19/2018 Referral Source:Provider Office Reason for Referral:Disease Management Education Insurance:Health Team Advantage   Outreach Attempt:  Outreach attempt #1 to patient for follow up. No answer. RN Health Coach left HIPAA compliant voicemail message along with contact information.  Plan:  RN Health Coach will make another outreach attempt within the month of July if patient has not transitioned to [place].  [employer] Coach [phone] [email or web address]

## 2018-07-28 ENCOUNTER — Ambulatory Visit (INDEPENDENT_AMBULATORY_CARE_PROVIDER_SITE_OTHER): Payer: PPO | Admitting: Family Medicine

## 2018-07-28 ENCOUNTER — Other Ambulatory Visit: Payer: Self-pay

## 2018-07-28 ENCOUNTER — Encounter: Payer: Self-pay | Admitting: Family Medicine

## 2018-07-28 DIAGNOSIS — G8929 Other chronic pain: Secondary | ICD-10-CM | POA: Diagnosis not present

## 2018-07-28 DIAGNOSIS — M545 Low back pain: Secondary | ICD-10-CM

## 2018-07-28 DIAGNOSIS — S39012A Strain of muscle, fascia and tendon of lower back, initial encounter: Secondary | ICD-10-CM

## 2018-07-28 NOTE — Patient Instructions (Addendum)
Thank you for coming to the office today.  For your Back Pain - I think that this is due to Muscle Spasms or strain.   Recommend to start taking Tylenol Extra Strength 500mg  tabs - take 1 to 2 tabs per dose (max 1000mg ) every 6-8 hours for pain (take regularly, don't skip a dose for next 7 days), max 24 hour daily dose is 6 tablets or 3000mg . In the future you can repeat the same everyday Tylenol course for 1-2 weeks at a time.   May use Tylenol Extra Str 500mg  tabs - may take 1-2 tablets every 8 hours as needed  Recommend to start using heating pad on your lower back 1-2x daily for few weeks  Can try topical muscle rub (icy hot, Bengay, or Asercreme)  If not improving - call us back and message on mychart and we can send rx Meloxicam low dose 7.5mg  once daily with food, it is a safer anti-inflammatory.  This pain may take weeks to months to fully resolve, but hopefully it will respond to the medicine initially. All back injuries (small or serious) are slow to heal since we use our back muscles every day. Be careful with turning, twisting, lifting, sitting / standing for prolonged periods, and avoid re-injury.  If your symptoms significantly worsen with more pain, or new symptoms with weakness in one or both legs, new or different shooting leg pains, numbness in legs or groin, loss of control or retention of urine or bowel movements, please call back for advice and you may need to go directly to the Emergency Department.   Please schedule a Follow-up Appointment to: Return in about 4 weeks (around 08/25/2018), or if symptoms worsen or fail to improve, for Back Pain.  If you have any other questions or concerns, please feel free to call the office or send a message through [place]. You may also schedule an earlier appointment if necessary.  Additionally, you may be receiving a survey about your experience at our office within a few days to 1 week by e-mail or mail. We value your  feedback.  Nobie Putnam, DO [place]

## 2018-07-28 NOTE — Progress Notes (Signed)
Virtual Visit via Telephone The purpose of this virtual visit is to provide medical care while limiting exposure to the novel coronavirus (COVID19) for both patient and office staff.  Consent was obtained for phone visit:  Yes.   Answered questions that patient had about telehealth interaction:  Yes.   I discussed the limitations, risks, security and privacy concerns of performing an evaluation and management service by telephone. I also discussed with the patient that there may be a patient responsible charge related to this service. The patient expressed understanding and agreed to proceed.  Patient Location: Home Provider Location: [facility] (Office)  PCP is [name], AGPCNP-BC - I am currently covering during her maternity leave.   ---------------------------------------------------------------------- Chief Complaint  Patient presents with  . Back Pain    constant worsen mid- lower back pain x 3 weeks     S: Reviewed CMA documentation. 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 - Today seems to be persistent vs gradually worsening. Describes pain as aching, severity moderate to severe. No pain radiating to legs. - Tried Ibuprofen in past but doesn't like taking this med - Tried heating pad at night. Tried muscle rub some relief next day. - No obvious history lumbar OA/DJD, but prior back flare up mild in past - Denies any fevers/chills, numbness, tingling, weakness, loss of control bladder/bowel incontinence or retention, unintentional wt loss, night sweats  Past Medical History:  Diagnosis Date  . Allergy   . Chronic pain of both knees   .  Diabetes mellitus without complication ([place])   . Hyperlipidemia   . Hypertension    Social History   Tobacco Use  . Smoking status: Never Smoker  . Smokeless tobacco: Never Used  Substance Use Topics  . Alcohol use: Yes    Alcohol/week: 0.0 - 1.0 standard drinks    Comment: occasional  . Drug use: Never    Current Outpatient Medications:  .  Ascorbic Acid (VITAMIN C) 1000 MG tablet, Take 1,000 mg by mouth daily., Disp: , Rfl:  .  atorvastatin (LIPITOR) 40 MG tablet, Take 1 tablet (40 mg total) by mouth daily., Disp: 90 tablet, Rfl: 1 .  Biotin 1000 MCG tablet, Take by mouth every other day. , Disp: , Rfl:  .  Cholecalciferol (VITAMIN D3) 50 MCG (2000 UT) capsule, Take 2,000 Units by mouth daily. , Disp: , Rfl:  .  ibuprofen (ADVIL,MOTRIN) 400 MG tablet, Take 400 mg by mouth every 6 (six) hours as needed. , Disp: , Rfl:  .  insulin degludec (TRESIBA FLEXTOUCH) 100 UNIT/ML SOPN FlexTouch Pen, Inject 0.26 mLs (26 Units total) into the skin daily. (Patient taking differently: Inject 24 Units into the skin daily. ), Disp: 3 pen, Rfl: 5 .  Insulin Pen Needle (PEN NEEDLES) 32G X 5 MM MISC, 1 Device by Does not apply route daily., Disp: 100 each, Rfl: 3 .  metFORMIN (GLUCOPHAGE-XR) 500 MG 24 hr tablet, Take 3 tablets (1,500 mg total) by mouth daily with breakfast., Disp: 270 tablet, Rfl: 1 .  Semaglutide, 1 MG/DOSE, (OZEMPIC, 1 MG/DOSE,) 2 MG/1.5ML SOPN, Inject 1 mg into the skin once a week., Disp: 6 pen,  Rfl: 1 .  spironolactone (ALDACTONE) 25 MG tablet, Take 1 tablet (25 mg total) by mouth daily., Disp: 90 tablet, Rfl: 1 .  vitamin B-12 (CYANOCOBALAMIN) 1000 MCG tablet, Take 1,000 mcg by mouth daily. , Disp: , Rfl:   Depression screen [facility] 2/9 02/26/2018 01/29/2018 01/18/2018  Decreased Interest 0 0 0  Down, Depressed, Hopeless 0 0 0  PHQ - 2 Score 0 0 0    GAD 7 : Generalized Anxiety Score 06/04/2018  Nervous, Anxious, on Edge 0  Control/stop worrying 0  Worry too much - different  things 0  Trouble relaxing 0  Restless 0  Easily annoyed or irritable 0  Afraid - awful might happen 1  Total GAD 7 Score 1  Anxiety Difficulty Not difficult at all    -------------------------------------------------------------------------- O: No physical exam performed due to remote telephone encounter.  Lab results reviewed.  Recent Results (from the past 2160 hour(s))  COMPLETE METABOLIC PANEL WITH GFR     Status: Abnormal   Collection Time: 06/10/18  8:24 AM  Result Value Ref Range   Glucose, Bld 150 (H) 65 - 99 mg/dL    Comment: .            Fasting reference interval . For someone without known diabetes, a glucose value >125 mg/dL indicates that they may have diabetes and this should be confirmed with a follow-up test. .    BUN 11 7 - 25 mg/dL   Creat 0.79 0.50 - 0.99 mg/dL    Comment: For patients >30 years of age, the reference limit for Creatinine is approximately 13% higher for people identified as African-American. .    GFR, Est Non African American 79 > OR = 60 mL/min/1.43m2   GFR, Est African American 91 > OR = 60 mL/min/1.48m2   BUN/Creatinine Ratio NOT APPLICABLE 6 - 22 (calc)   Sodium 140 135 - 146 mmol/L   Potassium 4.8 3.5 - 5.3 mmol/L   Chloride 105 98 - 110 mmol/L   CO2 23 20 - 32 mmol/L   Calcium 9.1 8.6 - 10.4 mg/dL   Total Protein 6.7 6.1 - 8.1 g/dL   Albumin 4.2 3.6 - 5.1 g/dL   Globulin 2.5 1.9 - 3.7 g/dL (calc)   AG Ratio 1.7 1.0 - 2.5 (calc)   Total Bilirubin 0.5 0.2 - 1.2 mg/dL   Alkaline phosphatase (APISO) 129 37 - 153 U/L   AST 18 10 - 35 U/L   ALT 20 6 - 29 U/L  Hemoglobin A1c     Status: Abnormal   Collection Time: 06/10/18  8:24 AM  Result Value Ref Range   Hgb A1c MFr Bld 8.1 (H) <5.7 % of total Hgb    Comment: For someone without known diabetes, a hemoglobin A1c value of 6.5% or greater indicates that they may have  diabetes and this should be confirmed with a follow-up  test. . For someone with known diabetes, a value  <7% indicates  that their diabetes is well controlled and a value  greater than or equal to 7% indicates suboptimal  control. A1c targets should be individualized based on  duration of diabetes, age, comorbid conditions, and  other considerations. . Currently, no consensus exists regarding use of hemoglobin A1c for diagnosis of diabetes for children. .    Mean Plasma Glucose 186 (calc)   eAG (mmol/L) 10.3 (calc)  Lipid panel     Status: Abnormal   Collection Time: 06/10/18  8:24 AM  Result Value Ref Range  Cholesterol 111 <200 mg/dL   HDL 39 (L) > OR = 50 mg/dL   Triglycerides 102 <150 mg/dL   LDL Cholesterol (Calc) 53 mg/dL (calc)    Comment: Reference range: <100 . Desirable range <100 mg/dL for primary prevention;   <70 mg/dL for patients with CHD or diabetic patients  with > or = 2 CHD risk factors. Marland Kitchen LDL-C is now calculated using the Martin-Hopkins  calculation, which is a validated novel method providing  better accuracy than the Friedewald equation in the  estimation of LDL-C.  Cresenciano Genre et al. Annamaria Helling. 4481;856(31): 2061-2068  (http://education.[employer].com/faq/FAQ164)    Total CHOL/HDL Ratio 2.8 <5.0 (calc)   Non-HDL Cholesterol (Calc) 72 <130 mg/dL (calc)    Comment: For patients with diabetes plus 1 major ASCVD risk  factor, treating to a non-HDL-C goal of <100 mg/dL  (LDL-C of <70 mg/dL) is considered a therapeutic  option.   Microalbumin, urine     Status: None   Collection Time: 06/10/18  8:24 AM  Result Value Ref Range   Microalb, Ur 0.8 mg/dL    Comment: Reference Range Not established    RAM      Comment: . The ADA defines abnormalities in albumin excretion as follows: Marland Kitchen Category         Result (mcg/mg creatinine) . Normal                    <30 Microalbuminuria         30-299  Clinical albuminuria   > OR = 300 . The ADA recommends that at least two of three specimens collected within a 3-6 month period be abnormal before considering  a patient to be within a diagnostic category.     -------------------------------------------------------------------------- A&P:  Problem List Items Addressed This Visit    Chronic bilateral low back pain without sciatica - Primary    Other Visit Diagnoses    Strain of lumbar region, initial encounter         Acute on chronic mid LBP without associated sciatica. 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 - No clear diagnosed history of OA DJD or other back abnormality - No red flag symptoms. - Inadequate conservative therapy   Plan: 1. Patient declines rx management with NSAID or muscle relaxant - she does not tolerate nsaid well and had issue with intolerance side effect on muscle relaxant in past. If she  - Proceed with high dose Tylenol as advised Ext Str 500-1000mg  TID PRN - Encouraged use of heating pad 1-2x daily for now then PRN - use muscle rub OTC as advised Follow-up 4-6 weeks if not improved for re-evaluation, consider X-ray imaging, trial of PT, and possibly referral to Orthopedic    No orders of the defined types were placed in this encounter.   Follow-up: - Return in 4 weeks if not improved back pain  Patient verbalizes understanding with the above medical recommendations including the limitation of remote medical advice.  Specific follow-up and call-back criteria were given for patient to follow-up or seek medical care more urgently if needed.   - Time spent in direct consultation with patient on phone: 11 minutes   Summer Young, [facility] 07/28/2018, 10:22 AM

## 2018-08-12 ENCOUNTER — Other Ambulatory Visit: Payer: Self-pay | Admitting: *Deleted

## 2018-08-12 DIAGNOSIS — G8929 Other chronic pain: Secondary | ICD-10-CM

## 2018-08-12 NOTE — Patient Outreach (Signed)
Summer Young [facility]) Care Management  08/12/2018  Summer Young 12-07-1950 [id number]   Case Closure/Transition to [facility] Health/CCI  Referral Date:01/19/2018 Referral Source:Provider Office Reason for Referral:Disease Management Education Insurance:Health Team Advantage   Outreach:  Patient case has been transitioned to [facility] Health/CCI for further Care Management assistance.  Plan: RN Health Coach will close case at this time. RN Health Coach will send primary care provider Care Management Case Closure Letter. RN Health Coach will make patient inactive with [facility] Care Management at this time.  [place] [phone] [email or web address]

## 2018-08-13 NOTE — Addendum Note (Signed)
Addended by: Olin Hauser on: 08/13/2018 10:37 AM   Modules accepted: Orders

## 2018-08-17 ENCOUNTER — Encounter: Payer: Self-pay | Admitting: Nurse Practitioner

## 2018-08-17 LAB — ABI
Left ABI: 1
Right ABI: 1.07

## 2018-08-23 DIAGNOSIS — M545 Low back pain: Secondary | ICD-10-CM | POA: Diagnosis not present

## 2018-08-24 ENCOUNTER — Ambulatory Visit: Payer: Self-pay | Admitting: *Deleted

## 2018-08-30 DIAGNOSIS — M545 Low back pain: Secondary | ICD-10-CM | POA: Diagnosis not present

## 2018-09-02 DIAGNOSIS — M545 Low back pain: Secondary | ICD-10-CM | POA: Diagnosis not present

## 2018-09-06 DIAGNOSIS — M545 Low back pain: Secondary | ICD-10-CM | POA: Diagnosis not present

## 2018-09-09 ENCOUNTER — Other Ambulatory Visit: Payer: Self-pay | Admitting: Pharmacy Technician

## 2018-09-09 ENCOUNTER — Ambulatory Visit (INDEPENDENT_AMBULATORY_CARE_PROVIDER_SITE_OTHER): Payer: PPO | Admitting: Nurse Practitioner

## 2018-09-09 ENCOUNTER — Other Ambulatory Visit: Payer: Self-pay

## 2018-09-09 ENCOUNTER — Encounter: Payer: Self-pay | Admitting: Nurse Practitioner

## 2018-09-09 ENCOUNTER — Ambulatory Visit (INDEPENDENT_AMBULATORY_CARE_PROVIDER_SITE_OTHER): Payer: PPO | Admitting: Pharmacist

## 2018-09-09 VITALS — BP 134/74 | HR 79 | Temp 98.6°F | Resp 16 | Ht 62.0 in | Wt 173.0 lb

## 2018-09-09 DIAGNOSIS — E1165 Type 2 diabetes mellitus with hyperglycemia: Secondary | ICD-10-CM | POA: Diagnosis not present

## 2018-09-09 LAB — POCT GLYCOSYLATED HEMOGLOBIN (HGB A1C): Hemoglobin A1C: 7.3 % — AB (ref 4.0–5.6)

## 2018-09-09 NOTE — Progress Notes (Signed)
Subjective:    Patient ID: Summer Young, female    DOB: 1950-10-25, 68 y.o.   MRN: [id number]  Summer Young is a 68 y.o. female presenting on 09/09/2018 for Diabetes  HPI Diabetes Pt presents today for follow up of Type 2 diabetes mellitus. She is checking fasting am CBG at home with a range of 90-120 - Current diabetic medications include: metformin, Tresiba 24 units daily, and Ozempic 1 mg weekly.  She has stopped her glyburide as instructed at her last visit. - She is not currently symptomatic.  - She denies polydipsia, polyphagia, polyuria, headaches, diaphoresis, shakiness, chills, pain, numbness or tingling in extremities and changes in vision.   - Clinical course has been improving. - She  reports no regular exercise routine. - Her diet is low in salt, low in fat, and low in carbohydrates. - Weight trend: decreasing steadily  PREVENTION: Eye exam current (within one year): yes Foot exam current (within one year): yes  Lipid/ASCVD risk reduction - on statin: yes Kidney protection - on ace or arb: yes Recent Labs    03/02/18 0835 06/10/18 0824 09/09/18 0818  HGBA1C 8.4* 8.1* 7.3*    Social History   Tobacco Use  . Smoking status: Never Smoker  . Smokeless tobacco: Never Used  Substance Use Topics  . Alcohol use: Yes    Alcohol/week: 0.0 - 1.0 standard drinks    Comment: occasional  . Drug use: Never   Review of Systems Per HPI unless specifically indicated above     Objective:    BP 134/74   Pulse 79   Temp 98.6 F (37 C)   Resp 16   Ht 5\' 2"  (1.575 m)   Wt 173 lb (78.5 kg)   BMI 31.64 kg/m   Wt Readings from Last 3 Encounters:  09/09/18 173 lb (78.5 kg)  04/15/18 178 lb 11.2 oz (81.1 kg)  04/13/18 180 lb 3.2 oz (81.7 kg)    Physical Exam Vitals signs reviewed.  Constitutional:      General: She is awake. She is not in acute distress.    Appearance: She is well-developed. She is obese.  HENT:     Head: Normocephalic and atraumatic.  Neck:   Musculoskeletal: Normal range of motion and neck supple.     Vascular: No carotid bruit.  Cardiovascular:     Rate and Rhythm: Normal rate and regular rhythm.     Pulses:          Radial pulses are 2+ on the right side and 2+ on the left side.       Posterior tibial pulses are 1+ on the right side and 1+ on the left side.     Heart sounds: Normal heart sounds, S1 normal and S2 normal.  Pulmonary:     Effort: Pulmonary effort is normal. No respiratory distress.     Breath sounds: Normal breath sounds and air entry.  Skin:    General: Skin is warm and dry.     Capillary Refill: Capillary refill takes less than 2 seconds.  Neurological:     General: No focal deficit present.     Mental Status: She is alert and oriented to person, place, and time. Mental status is at baseline.  Psychiatric:        Attention and Perception: Attention normal.        Mood and Affect: Mood and affect normal.        Behavior: Behavior normal. Behavior is cooperative.  Results for orders placed or performed in visit on 06/04/18  COMPLETE METABOLIC PANEL WITH GFR  Result Value Ref Range   Glucose, Bld 150 (H) 65 - 99 mg/dL   BUN 11 7 - 25 mg/dL   Creat 0.79 0.50 - 0.99 mg/dL   GFR, Est Non African American 79 > OR = 60 mL/min/1.29m2   GFR, Est African American 91 > OR = 60 mL/min/1.33m2   BUN/Creatinine Ratio NOT APPLICABLE 6 - 22 (calc)   Sodium 140 135 - 146 mmol/L   Potassium 4.8 3.5 - 5.3 mmol/L   Chloride 105 98 - 110 mmol/L   CO2 23 20 - 32 mmol/L   Calcium 9.1 8.6 - 10.4 mg/dL   Total Protein 6.7 6.1 - 8.1 g/dL   Albumin 4.2 3.6 - 5.1 g/dL   Globulin 2.5 1.9 - 3.7 g/dL (calc)   AG Ratio 1.7 1.0 - 2.5 (calc)   Total Bilirubin 0.5 0.2 - 1.2 mg/dL   Alkaline phosphatase (APISO) 129 37 - 153 U/L   AST 18 10 - 35 U/L   ALT 20 6 - 29 U/L  Hemoglobin A1c  Result Value Ref Range   Hgb A1c MFr Bld 8.1 (H) <5.7 % of total Hgb   Mean Plasma Glucose 186 (calc)   eAG (mmol/L) 10.3 (calc)  Lipid  panel  Result Value Ref Range   Cholesterol 111 <200 mg/dL   HDL 39 (L) > OR = 50 mg/dL   Triglycerides 102 <150 mg/dL   LDL Cholesterol (Calc) 53 mg/dL (calc)   Total CHOL/HDL Ratio 2.8 <5.0 (calc)   Non-HDL Cholesterol (Calc) 72 <130 mg/dL (calc)  Microalbumin, urine  Result Value Ref Range   Microalb, Ur 0.8 mg/dL   RAM        Assessment & Plan:   Problem List Items Addressed This Visit      Endocrine   Diabetes mellitus ([place]) - Primary   Relevant Orders   POCT HgB A1C    Improving control of T2DM with A1c 7.3 improved from last check and goal A1c < 7.0%. - Complications - hyperglycemia.  Plan:  1. Change therapy: Continue metformin and Ozempic without change.  INCREASE Tresiba to 26 units daily 2. Encourage improved lifestyle: - low carb/low glycemic diet reinforced prior education - Increase physical activity to 30 minutes most days of the week.  Explained that increased physical activity increases body's use of sugar for energy. 3. Check fasting am CBG and bring log to next visit for review 4. Continue ASA, ACEi and Statin 5. Encouraged regular eye, foot exams 6. Follow-up 3 months     Follow up plan: Return in about 3 months (around 12/10/2018) for diabetes AND welcome to medicare before 7/31 if possible.  [name], DNP, AGPCNP-BC Adult Gerontology Primary Care Nurse Practitioner [facility] 09/09/2018, 8:36 AM

## 2018-09-09 NOTE — Patient Outreach (Signed)
[facility]) Care Management  09/09/2018  Summer Young 1951/02/06 [id number]                           Medication Assistance Referral  Referral From: [facility] Dorthula Perfect [facility])  Medication/Company: [employer] Patient application portion:  Mailed Provider application portion: Faxed  to [name], NP    Follow up:  Will follow up with patient in 5-10 business days to confirm application(s) have been received.  Levii Hairfield P. [name], [place] Management [phone]

## 2018-09-09 NOTE — Patient Instructions (Addendum)
Summer Young,   Thank you for coming in to clinic today.  1. Great work!!! You have reached our first goal of A1c less than 8% - Less than 7% is our next goal.  2. Go back up to 26 units on Tresiba.  Continue Ozempic without changes.  Please schedule a follow-up appointment with [name], AGNP. Return in about 3 months (around 12/10/2018) for diabetes AND welcome to medicare before 7/31 if possible.  If you have any other questions or concerns, please feel free to call the clinic or send a message through [place]. You may also schedule an earlier appointment if necessary.  You will receive a survey after today's visit either digitally by e-mail or paper by [employer]. Your experiences and feedback matter to [place].  Please respond so we know how we are doing as we provide care for you.   [name], DNP, AGNP-BC Adult Gerontology Nurse Practitioner [name]

## 2018-09-10 NOTE — Patient Instructions (Addendum)
Thank you allowing the Chronic Care Management Team to be a part of your care! It was a pleasure speaking with you today!     CCM (Chronic Care Management) Team    Janci Minor RN, BSN Nurse Care Coordinator  [phone]   [name] PharmD  Clinical Pharmacist  [phone]   Eula Fried LCSW Clinical Social Worker [phone]  Visit Information  Goals Addressed            This Visit's Progress   . Medication Assistance       Current Barriers:  . Financial Barriers - currently in coverage gap of Part D coverage  Pharmacist Clinical Goal(s):  Marland Kitchen Over the next 30 days, patient will work with CM Pharmacist to address needs related to medication assistance and medication management optimization  Interventions: . Counsel patient on Medicare Part D Plan . Review medication assistance options with patient as she reports that she is currently in the coverage gap of her Medicare Part D coverage o Based on her reported household income, Ms. Emmerich does not meet the income requirement for extra help subsidy; however, she would meet the income requirement for the [employer] patient assistance program . Will collaborate with [place] to aid Ms. Garner in applying for patient assistance for Ozempic and [name] through [employer] patient assistance program o Review with patient necessary documents to gather for the application  . Comprehensive medication review scheduled for 8/3  Patient Self Care Activities:  . Self administers medications as prescribed . Attends all scheduled provider appointments . Calls pharmacy for medication refills . Calls provider office for new concerns or questions . Gather necessary documents for patient assistance program application  Initial goal documentation        Ms. Revelle was given information about Chronic Care Management services today including:  1. CCM service includes personalized support from  designated clinical staff supervised by her physician, including individualized plan of care and coordination with other care providers 2. 24/7 contact phone numbers for assistance for urgent and routine care needs. 3. Service will only be billed when office clinical staff spend 20 minutes or more in a month to coordinate care. 4. Only one practitioner may furnish and bill the service in a calendar month. 5. The patient may stop CCM services at any time (effective at the end of the month) by phone call to the office staff. 6. The patient will be responsible for cost sharing (co-pay) of up to 20% of the service fee (after annual deductible is met).  Patient agreed to services and verbal consent obtained.   The patient verbalized understanding of instructions provided today and declined a print copy of patient instruction materials.   Telephone follow up appointment with care management team member scheduled for: 8/3 at 4 pm  [name], PharmD, [place] [phone]

## 2018-09-10 NOTE — Chronic Care Management (AMB) (Signed)
  Chronic Care Management   Note  09/10/2018 Name: Mashelle Busick MRN: [id number] DOB: [birth year]   Subjective:   Latresha Yahr is a 68 y.o. year old female who is a primary care patient of [name], NP. The CM team was consulted for assistance with chronic disease management and care coordination, particularly for medication assistance with cost of Ozempic.  I reached out to [facility] by phone today.   Ms. Boss was given information about Chronic Care Management services today including:  1. CCM service includes personalized support from designated clinical staff supervised by her physician, including individualized plan of care and coordination with other care providers 2. 24/7 contact phone numbers for assistance for urgent and routine care needs. 3. Service will only be billed when office clinical staff spend 20 minutes or more in a month to coordinate care. 4. Only one practitioner may furnish and bill the service in a calendar month. 5. The patient may stop CCM services at any time (effective at the end of the month) by phone call to the office staff. 6. The patient will be responsible for cost sharing (co-pay) of up to 20% of the service fee (after annual deductible is met).  Patient agreed to services and verbal consent obtained.   Review of patient status, including review of consultants reports, laboratory and other test data, was performed as part of comprehensive evaluation and provision of chronic care management services.    Assessment:   Goals Addressed            This Visit's Progress   . Medication Assistance       Current Barriers:  . Financial Barriers - currently in coverage gap of Part D coverage  Pharmacist Clinical Goal(s):  Marland Kitchen Over the next 30 days, patient will work with CM Pharmacist to address needs related to medication assistance and medication management optimization  Interventions: . Counsel patient on Medicare Part D Plan .  Review medication assistance options with patient as she reports that she is currently in the coverage gap of her Medicare Part D coverage o Based on her reported household income, Ms. Kobel does not meet the income requirement for extra help subsidy; however, she would meet the income requirement for the [employer] patient assistance program . Will collaborate with [place] Simcox to aid Ms. Meno in applying for patient assistance for Ozempic and [name] through [employer] patient assistance program o Review with patient necessary documents to gather for the application  . Comprehensive medication review scheduled for 8/3  Patient Self Care Activities:  . Self administers medications as prescribed . Attends all scheduled provider appointments . Calls pharmacy for medication refills . Calls provider office for new concerns or questions . Gather necessary documents for patient assistance program application  Initial goal documentation        Plan:  Telephone follow up appointment with care management team member scheduled for: 8/3 at 4 pm  [name], PharmD, [place] [phone]

## 2018-09-12 ENCOUNTER — Encounter: Payer: Self-pay | Admitting: Nurse Practitioner

## 2018-09-13 DIAGNOSIS — M545 Low back pain: Secondary | ICD-10-CM | POA: Diagnosis not present

## 2018-09-16 ENCOUNTER — Encounter: Payer: Self-pay | Admitting: Nurse Practitioner

## 2018-09-16 ENCOUNTER — Other Ambulatory Visit (HOSPITAL_COMMUNITY)
Admission: RE | Admit: 2018-09-16 | Discharge: 2018-09-16 | Disposition: A | Discharge status: Home / Self Care | Payer: PPO | Source: Ambulatory Visit | Attending: Nurse Practitioner | Admitting: Nurse Practitioner

## 2018-09-16 ENCOUNTER — Other Ambulatory Visit: Payer: Self-pay

## 2018-09-16 ENCOUNTER — Ambulatory Visit: Payer: PPO | Admitting: Nurse Practitioner

## 2018-09-16 ENCOUNTER — Ambulatory Visit (INDEPENDENT_AMBULATORY_CARE_PROVIDER_SITE_OTHER): Payer: PPO | Admitting: Nurse Practitioner

## 2018-09-16 VITALS — BP 122/77 | HR 80 | Ht 62.0 in | Wt 172.4 lb

## 2018-09-16 DIAGNOSIS — Z780 Asymptomatic menopausal state: Secondary | ICD-10-CM | POA: Diagnosis not present

## 2018-09-16 DIAGNOSIS — M545 Low back pain: Secondary | ICD-10-CM | POA: Diagnosis not present

## 2018-09-16 DIAGNOSIS — Z0000 Encounter for general adult medical examination without abnormal findings: Secondary | ICD-10-CM | POA: Diagnosis not present

## 2018-09-16 DIAGNOSIS — Z124 Encounter for screening for malignant neoplasm of cervix: Secondary | ICD-10-CM | POA: Diagnosis not present

## 2018-09-16 DIAGNOSIS — Z1151 Encounter for screening for human papillomavirus (HPV): Secondary | ICD-10-CM | POA: Insufficient documentation

## 2018-09-16 NOTE — Progress Notes (Signed)
Subjective:    Summer Young is a 68 y.o. female who presents for Medicare Initial preventive examination.  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     atorvastatin (LIPITOR) 40 MG tablet Take 1 tablet (40 mg total) by mouth daily. 90 tablet 1   Biotin 1000 MCG tablet Take by mouth every other day.      Cholecalciferol (VITAMIN D3) 50 MCG (2000 UT) capsule Take 2,000 Units by mouth daily.      ibuprofen (ADVIL,MOTRIN) 400 MG tablet Take 400 mg by mouth every 6 (six) hours as needed.      insulin degludec (TRESIBA FLEXTOUCH) 100 UNIT/ML SOPN FlexTouch Pen Inject 0.26 mLs (26 Units total) into the skin daily. 3 pen 5   Insulin Pen Needle (PEN NEEDLES) 32G X 5 MM MISC 1 Device by Does not apply route daily. 100 each 3   meloxicam (MOBIC) 15 MG tablet meloxicam 15 mg tablet     metFORMIN (GLUCOPHAGE-XR) 500 MG 24 hr tablet Take 3 tablets (1,500 mg total) by mouth daily with breakfast. 270 tablet 1   Semaglutide, 1 MG/DOSE, (OZEMPIC, 1 MG/DOSE,) 2 MG/1.5ML SOPN Inject 1 mg into the skin once a week. 6 pen 1   spironolactone (ALDACTONE) 25 MG tablet Take 1 tablet (25 mg total) by mouth daily. 90  tablet 1   vitamin B-12 (CYANOCOBALAMIN) 1000 MCG tablet Take 1,000 mcg by mouth daily.      No current facility-administered medications on file prior to visit.    Allergies (verified) Aspirin, Insulin detemir, Shellfish allergy, Dapagliflozin, Influenza vaccines, and Other  PAST HISTORY Family History Family History  Problem Relation Age of Onset   Diabetes Mother    Diabetes Father    Stroke Father    Diabetes Sister    Heart attack Other    Diabetes Sister    Diabetes Sister    Diabetes Sister    Social History Social History   Tobacco Use   Smoking status: Never Smoker   Smokeless tobacco: Never Used  Substance Use Topics   Alcohol use: Yes    Alcohol/week: 0.0 - 1.0 standard drinks    Comment: occasional    Are there smokers in your home (other than you)? No  Risk Factors Current exercise habits: Home exercise routine includes walking 5-6 hrs per week.  Dietary issues discussed: none  Hospitalizations in last 1 year: none  Cardiac risk factors: advanced age (older than 42 for men, 36 for women), diabetes mellitus, dyslipidemia and obesity (BMI >= 30 kg/m2).  Depression Screen Depression screen [facility] 2/9 09/16/2018 02/26/2018 01/29/2018 01/18/2018  Decreased Interest 0 0 0 0  Down, Depressed, Hopeless 0 0 0 0  PHQ - 2 Score 0 0 0 0     Activities of  Daily Living In your present state of health, do you have any difficulty performing the following activities?:  Driving? No Managing money?  No Feeding yourself? No Getting from bed to chair? No Climbing a flight of stairs? No Preparing food and eating?: No Bathing or showering? No Getting dressed: No Getting to the toilet? No Using the toilet:No Moving around from place to place: No In the past year have you fallen or had a near fall?:No   Are you sexually active?  Yes  Do you have more than one partner?  No  Hearing Difficulties: No Do you often ask people to speak up or repeat themselves?  No Do you experience ringing or noises in your ears? No Do you have difficulty understanding soft or whispered voices? No   Do you feel that you have a problem with memory? Yes, a little  Do you often misplace items? No  Do you feel safe at home?  Yes  Cognitive Testing  Alert? Yes  Normal Appearance?Yes  Oriented to person? Yes  Place? Yes   Time? Yes  Displays appropriate judgment?Yes   6CIT Screen 09/16/2018  What Year? 0 points  What month? 0 points  What time? 0 points  Count back from 20 0 points  Months in reverse 0 points  Repeat phrase 2 points  Total Score 2    Advanced Directives  Advanced Directives have been discussed with the patient? Yes    List the Names of Other Physician/Practitioners you currently use: 1.  None  Medical Services received from non-[facility] providers in the past year: YES GI at [facility]  Immunization History  Administered Date(s) Administered   Pneumococcal Conjugate-13 11/18/2017   Pneumococcal Polysaccharide-23 09/20/2008   Tdap 04/23/2007, 07/16/2017   Zoster 11/12/2015    Screening Tests Health Maintenance  Topic Date Due   PAP SMEAR-Modifier  10/17/1973   MAMMOGRAM  10/18/2002   DEXA SCAN  10/17/2017   INFLUENZA VACCINE  09/18/2018   PNA vac Low Risk Adult (2 of 2 - PPSV23) 11/19/2018   OPHTHALMOLOGY EXAM  01/02/2019   FOOT EXAM  03/03/2019   HEMOGLOBIN A1C  03/12/2019   URINE MICROALBUMIN  06/10/2019   TETANUS/TDAP  07/17/2027   COLONOSCOPY  03/05/2028   Hepatitis C Screening  Completed   HIV Screening  Completed    History reviewed: allergies, current medications, past family history, past medical history, past social history, past surgical history and problem list  Review of Systems ROS   Objective:    BP 122/77 (BP Location: Left Arm, Patient Position: Sitting, Cuff Size: Normal)    Pulse 80    Ht 5\' 2"  (1.575 m)    Wt 172 lb 6.4 oz (78.2 kg)    BMI 31.53 kg/m    Hearing Screening   125Hz  250Hz   500Hz  1000Hz  2000Hz  3000Hz  4000Hz  6000Hz  8000Hz   Right ear:   Pass Pass Pass  Pass    Left ear:   Pass Pass Pass  Pass    Comments: On 40 dBHL   Visual Acuity Screening   Right eye Left eye Both eyes  Without correction: 20/50 20/20 20/20   With correction:       Filed Weights   09/16/18 1107  Weight: 172 lb 6.4 oz (78.2 kg)    Physical Exam  09/17/18 EKG: NSR      Assessment and Plan:    Problem List Items Addressed This Visit    None    Visit Diagnoses    Initial Medicare annual  wellness visit    -  Primary   Cervical cancer screening       Relevant Orders   Cytology - PAP      During the course of the visit the patient was educated and counseled about appropriate screening and preventive services including:    Screening electrocardiogram  Screening Pap smear and pelvic exam   Nutrition counseling   Advanced directives: has NO advanced directive - not interested in additional information  Patient Instructions (the written plan) was given to the patient.  Medicare Attestation I have personally reviewed: The patient's medical and social history Their use of alcohol, tobacco or illicit drugs Their current medications and supplements The patient's functional ability including ADLs,fall risks, home safety risks, cognitive, and hearing and visual impairment Diet and physical activities Evidence for depression or mood disorders  The patient's weight, height, BMI, and visual acuity have been recorded in the chart.  I have made referrals, counseling, and provided education to the patient based on review of the above and I have provided the patient with a written personalized care plan for preventive services.     [name], DNP, AGPCNP-BC Adult Gerontology Primary Care Nurse Practitioner [facility] 09/16/2018, 11:43 AM

## 2018-09-16 NOTE — Patient Instructions (Addendum)
  Summer Young , Thank you for taking time to come for your Medicare Wellness Visit. I appreciate your ongoing commitment to your health goals. Please review the following plan we discussed and let me know if I can assist you in the future.   These are the goals we discussed: Goals    . Medication Assistance     Current Barriers:  . Financial Barriers - currently in coverage gap of Part D coverage  Pharmacist Clinical Goal(s):  Marland Kitchen Over the next 30 days, patient will work with CM Pharmacist to address needs related to medication assistance and medication management optimization  Interventions: . Counsel patient on Medicare Part D Plan . Review medication assistance options with patient as she reports that she is currently in the coverage gap of her Medicare Part D coverage o Based on her reported household income, Ms. Pavia does not meet the income requirement for extra help subsidy; however, she would meet the income requirement for the [employer] patient assistance program . Will collaborate with [place] Simcox to aid Ms. Loria in applying for patient assistance for Ozempic and [name] through [employer] patient assistance program o Review with patient necessary documents to gather for the application  . Comprehensive medication review scheduled for 8/3  Patient Self Care Activities:  . Self administers medications as prescribed . Attends all scheduled provider appointments . Calls pharmacy for medication refills . Calls provider office for new concerns or questions . Gather necessary documents for patient assistance program application  Initial goal documentation     . Weight (lb) < 160 lb (72.6 kg)       This is a list of the screening recommended for you and due dates:  Health Maintenance  Topic Date Due  . Pap Smear  10/17/1973  . Mammogram  10/18/2002  . Colon Cancer Screening  10/18/2002  . DEXA scan (bone density measurement)  10/17/2017  . Flu Shot   09/18/2018  . Pneumonia vaccines (2 of 2 - PPSV23) 11/19/2018  . Eye exam for diabetics  01/02/2019  . Complete foot exam   03/03/2019  . Hemoglobin A1C  03/12/2019  . Urine Protein Check  06/10/2019  . Tetanus Vaccine  07/17/2027  .  Hepatitis C: One time screening is recommended by Center for Disease Control  (CDC) for  adults born from [birth year] through 1963.   Completed  . HIV Screening  Completed

## 2018-09-17 ENCOUNTER — Encounter: Payer: Self-pay | Admitting: Nurse Practitioner

## 2018-09-20 ENCOUNTER — Ambulatory Visit (INDEPENDENT_AMBULATORY_CARE_PROVIDER_SITE_OTHER): Payer: PPO | Admitting: Pharmacist

## 2018-09-20 DIAGNOSIS — E1165 Type 2 diabetes mellitus with hyperglycemia: Secondary | ICD-10-CM

## 2018-09-20 DIAGNOSIS — M545 Low back pain: Secondary | ICD-10-CM | POA: Diagnosis not present

## 2018-09-20 LAB — CYTOLOGY - PAP
Diagnosis: NEGATIVE
HPV: NOT DETECTED

## 2018-09-20 NOTE — Chronic Care Management (AMB) (Signed)
Chronic Care Management   Note  09/20/2018 Name: Kateleen Encarnacion MRN: [id number] DOB: 07-01-1950   Subjective:   Summer Young is a 68 y.o. year old female who is a primary care patient of [name], NP. The CM team was consulted for assistance with chronic disease management and care coordination. Ms. Steinmeyer has a past medical history including but not limited to type 2 diabetes, low back pain and hypertension.  I reached out to [facility] by phone today as scheduled to complete medication review.   Review of patient status, including review of consultants reports, laboratory and other test data, was performed as part of comprehensive evaluation and provision of chronic care management services.   Objective:  Lab Results  Component Value Date   CREATININE 0.79 06/10/2018   CREATININE 0.87 03/03/2018   CREATININE 0.81 01/29/2018    Lab Results  Component Value Date   HGBA1C 7.3 (A) 09/09/2018       Component Value Date/Time   CHOL 111 06/10/2018 0824   TRIG 102 06/10/2018 0824   HDL 39 (L) 06/10/2018 0824   CHOLHDL 2.8 06/10/2018 0824   LDLCALC 53 06/10/2018 0824     BP Readings from Last 3 Encounters:  09/16/18 122/77  09/09/18 134/74  04/15/18 140/86    Allergies  Allergen Reactions  . Aspirin Hives    Other reaction(s): Other (See Comments) Other Reaction: Not Assessed   . Insulin Detemir Hives and Rash    Errythema, edema, heat at site of injection did not improve after 2 weeks of use.    . Shellfish Allergy Anaphylaxis  . Dapagliflozin Other (See Comments)    Yeast infections Yeast infections   . Influenza Vaccines Other (See Comments)  . Other     Other reaction(s): Other (See Comments) Uncoded Allergy. Allergen: Shellfish, Other Reaction: Not Assessed    Flu vaccine given 1998 caused anaphylaxis    Medications Reviewed Today    Reviewed by Vella Raring, [facility] (Pharmacist) on 09/20/18 at [place] List Status: <None>  Medication  Order Taking? Sig Documenting Provider Last Dose Status Informant  Ascorbic Acid (VITAMIN C) 1000 MG tablet [id number] Yes Take 1,000 mg by mouth daily. [provider] Taking Active            Med Note Winfield Cunas, [facility]   Mon Sep 20, 2018  4:05 PM) As needed to prevent illness  atorvastatin (LIPITOR) 40 MG tablet [id number] Yes Take 1 tablet (40 mg total) by mouth daily. [name], NP Taking Active   Biotin 1000 MCG tablet [id number] Yes Take by mouth every other day.  [provider] Taking Active Self           Med Note Broadus John, Trude Mcburney   Tue Jan 19, 2018 11:31 AM) [name] every other day  Cholecalciferol (VITAMIN D3) 50 MCG (2000 UT) capsule [id number] Yes Take 1,000 Units by mouth daily.  [provider] Taking Active   insulin degludec (TRESIBA FLEXTOUCH) 100 UNIT/ML SOPN FlexTouch Pen [id number] Yes Inject 0.26 mLs (26 Units total) into the skin daily. [name], NP Taking Active   Insulin Pen Needle (PEN NEEDLES) 32G X 5 MM MISC [id number]  1 Device by Does not apply route daily. [name], NP  Active   meloxicam [facility]) 15 MG tablet [id number] No Take 15 mg by mouth daily. prn [provider] Not Taking Active   metFORMIN (GLUCOPHAGE-XR) 500 MG 24 hr tablet [id number] Yes Take 3 tablets (  1,500 mg total) by mouth daily with breakfast. [name], NP Taking Active   Semaglutide, 1 MG/DOSE, (OZEMPIC, 1 MG/DOSE,) 2 MG/1.5ML SOPN [id number] Yes Inject 1 mg into the skin once a week. Olin Hauser, DO Taking Active   spironolactone (ALDACTONE) 25 MG tablet [id number] Yes Take 1 tablet (25 mg total) by mouth daily. [name], NP Taking Active   vitamin B-12 (CYANOCOBALAMIN) 1000 MCG tablet [id number] Yes Take 1,000 mcg by mouth daily.  [provider] Taking Active            Assessment:   Goals Addressed            This Visit's Progress   . Medication Assistance       Current  Barriers:  . Financial Barriers - currently in coverage gap of Part D coverage  Pharmacist Clinical Goal(s):  Marland Kitchen Over the next 30 days, patient will work with CM Pharmacist to address needs related to medication assistance and medication management optimization  Interventions: . Perform comprehensive medication review o Reports now only using meloxicam occasionally as needed as she her back pain has gotten better with physical therapy. - Denies using over the counter ibuprofen any longer . Counsel on importance of medication adherence and diabetes control o Reports that she has a system for taking her medications consistently everyday. Denies missed doses - Reports that she has tried using a weekly pillbox in the past, but denies needing the aid at this time. o Reports that she has been checking her fasting blood sugars daily and that this have ranged 95-115 mg/dL over the past week o Reports that she uses a App on her phone to record her blood sugar. o Reports some fingertip soreness with checking her blood sugar. Counsel patient on blood sugar monitoring technique, including location of testing on fingertip and rotating fingers. o Reports that she has been focusing on her diet to aid in her blood sugar control, including focusing on portion sizes, changing additives that she uses when cooking and avoiding dietary indiscretions . Collaborating with [place] to aid Ms. Sabo in applying for patient assistance for Ozempic and [name] through [employer] patient assistance program o Ms. Mattia reports that she completed the [employer] application and mailed back with required proof of income document and copy of insurance card to [facility] C[facility]  Patient Self Care Activities:  . Self administers medications as prescribed . Making dietary changes to help control her blood sugar: o focusing on portion sizes o changing additives that she uses when cooking o avoiding dietary  indiscretions . Attends all scheduled provider appointments . Calls pharmacy for medication refills . Calls provider office for new concerns or questions . Gather necessary documents for patient assistance program application . Checks blood sugars as directed o Keeps log in App on phone  Please see past updates related to this goal by clicking on the "Past Updates" button in the selected goal         Plan:  Telephone follow up appointment with care management team member scheduled for: 9/1 at 4 pm  [name], PharmD, [place] [phone]

## 2018-09-20 NOTE — Patient Instructions (Signed)
Thank you allowing the Chronic Care Management Team to be a part of your care! It was a pleasure speaking with you today!     CCM (Chronic Care Management) Team    Janci Minor RN, BSN Nurse Care Coordinator  [phone]   [name] PharmD  Clinical Pharmacist  [phone]   Eula Fried LCSW Clinical Social Worker [phone]  Visit Information  Goals Addressed            This Visit's Progress   . Medication Assistance       Current Barriers:  . Financial Barriers - currently in coverage gap of Part D coverage  Pharmacist Clinical Goal(s):  Marland Kitchen Over the next 30 days, patient will work with CM Pharmacist to address needs related to medication assistance and medication management optimization  Interventions: . Perform comprehensive medication review o Reports now only using meloxicam occasionally as needed as she her back pain has gotten better with physical therapy. - Denies using over the counter ibuprofen any longer . Counsel on importance of medication adherence and diabetes control o Reports that she has a system for taking her medications consistently everyday. Denies missed doses - Reports that she has tried using a weekly pillbox in the past, but denies needing the aid at this time. o Reports that she has been checking her fasting blood sugars daily and that this have ranged 95-115 mg/dL over the past week o Reports that she uses a App on her phone to record her blood sugar. o Reports some fingertip soreness with checking her blood sugar. Counsel patient on blood sugar monitoring technique, including location of testing on fingertip and rotating fingers. o Reports that she has been focusing on her diet to aid in her blood sugar control, including focusing on portion sizes, changing additives that she uses when cooking and avoiding dietary indiscretions . Collaborating with [place] to aid Ms. Skarda in applying for patient assistance for  Ozempic and [name] through [employer] patient assistance program o Ms. Bradt reports that she completed the [employer] application and mailed back with required proof of income document and copy of insurance card to [facility] C[facility]T  Patient Self Care Activities:  . Self administers medications as prescribed . Making dietary changes to help control her blood sugar: o focusing on portion sizes o changing additives that she uses when cooking o avoiding dietary indiscretions . Attends all scheduled provider appointments . Calls pharmacy for medication refills . Calls provider office for new concerns or questions . Gather necessary documents for patient assistance program application . Checks blood sugars as directed o Keeps log in App on phone  Please see past updates related to this goal by clicking on the "Past Updates" button in the selected goal         The patient verbalized understanding of instructions provided today and declined a print copy of patient instruction materials.   Telephone follow up appointment with care management team member scheduled for: 9/1 at 4 pm  [name], PharmD, [place] [phone]

## 2018-09-23 DIAGNOSIS — M545 Low back pain: Secondary | ICD-10-CM | POA: Diagnosis not present

## 2018-10-05 ENCOUNTER — Other Ambulatory Visit: Payer: Self-pay | Admitting: Pharmacy Technician

## 2018-10-05 NOTE — Patient Outreach (Signed)
[place] [facility]) Care Management  10/05/2018  Summer Young 04/03/1950 [id number]    Received all necessary documents and signatures from both patient and provider for [employer] patient assistance for [place] and Ozempic.  Submitted completed application via fax to [employer].  Will followup with Novo Nordisk in 2-7 business days to inquire on status of application.  Enoch Moffa P. Rei Contee, [place] Management [phone]

## 2018-10-08 ENCOUNTER — Other Ambulatory Visit: Payer: Self-pay | Admitting: Pharmacy Technician

## 2018-10-08 NOTE — Patient Outreach (Signed)
[place] [facility]) Care Management  10/08/2018  Jerniyah Agyeman [birth year] [name]    Care coordination call placed to [place] in regards to application for [place] and [place].  Spoke to [place] who informed patient had been APPROVED 10/07/2018-02/17/2019. She informed an order was placed with the pharmacy on 8/20 and that patient would be receiving a 4 month supply of medication. The medication would be delivered to the provider's office in the next 2-3 weeks of 10-14 business days..  Will followup with patient in 10-20 business days to confirm receipt of medication.  Eliyah Mcshea P. Tannia Contino, [place] Management [phone]

## 2018-10-10 ENCOUNTER — Encounter: Payer: Self-pay | Admitting: Nurse Practitioner

## 2018-10-13 ENCOUNTER — Encounter: Payer: Self-pay | Admitting: Nurse Practitioner

## 2018-10-15 ENCOUNTER — Ambulatory Visit: Payer: PPO | Admitting: Nurse Practitioner

## 2018-10-15 ENCOUNTER — Ambulatory Visit (INDEPENDENT_AMBULATORY_CARE_PROVIDER_SITE_OTHER): Payer: PPO | Admitting: Nurse Practitioner

## 2018-10-15 ENCOUNTER — Encounter: Payer: Self-pay | Admitting: Nurse Practitioner

## 2018-10-15 ENCOUNTER — Other Ambulatory Visit: Payer: Self-pay

## 2018-10-15 DIAGNOSIS — U071 COVID-19: Secondary | ICD-10-CM

## 2018-10-15 DIAGNOSIS — Z7189 Other specified counseling: Secondary | ICD-10-CM

## 2018-10-15 NOTE — Progress Notes (Signed)
Telemedicine Encounter: Disclosed to patient at start of encounter that we will provide appropriate telemedicine services.  Patient consents to be treated via phone prior to discussion. - Patient is at her home and is accessed via telephone. - Services are provided by [name] from [facility].  Subjective:    Patient ID: Summer Young, female    DOB: Jul 11, 1950, 68 y.o.   MRN: [name]  Summer Young is a 68 y.o. female presenting on 10/15/2018 for Anorexia (pt requesting to return to work after a recent diagnosis of Coronavirus x 10/02/18. Pt denies coughing, fever, SOB , fatigue and diarrhea. The only symptom she still have is loss of appetite.)  HPI Coronavirus Infection History - Patient had mild to moderate severity of symptoms.  Quarantined since 09/22/2018.  Patient was tested on 8/15 and was positive.  Patient notes never had any fever.  Patient had loss of appetite/smell, dizziness, loss of appetitie.  No cough, shortness of breath, or fever ever during illness. - Patient started regaining taste about 3 days ago.  Patient still has low appetite, but all other symptoms are resolved. - Patient notes sugars have been "up and down" through this sickness- likely due to eating more fruit.  Smoothies have been a regular choice because she could add peanut butter for protein/calories during her illness.  Taste was not important, so she added what she knew her body needed for nutrition.  Brief DM update: Patient's goal long term is to reduce DM medications.  Patient still has two Ozempic pens.  Will be receiving drug company assistance delivered to our office.  We will call patient once it arrives.  Can provide additional sample if needed before arrival.   Social History   Tobacco Use  . Smoking status: Never Smoker  . Smokeless tobacco: Never Used  Substance Use Topics  . Alcohol use: Yes    Alcohol/week: 0.0 - 1.0 standard drinks    Comment: occasional  . Drug  use: Never    Review of Systems Per HPI unless specifically indicated above     Objective:    There were no vitals taken for this visit.  Wt Readings from Last 3 Encounters:  09/16/18 172 lb 6.4 oz (78.2 kg)  09/09/18 173 lb (78.5 kg)  04/15/18 178 lb 11.2 oz (81.1 kg)    Physical Exam Patient remotely monitored.  Verbal communication appropriate.  Cognition normal.   Results for orders placed or performed in visit on 09/16/18  Cytology - PAP  Result Value Ref Range   Adequacy      Satisfactory for evaluation  endocervical/transformation zone component PRESENT.   Diagnosis      NEGATIVE FOR INTRAEPITHELIAL LESIONS OR MALIGNANCY.   HPV NOT Detected    Material Submitted CervicoVaginal Pap [ThinPrep Imaged]    CYTOLOGY - PAP PAP RESULT       Assessment & Plan:   Problem List Items Addressed This Visit    None    Visit Diagnoses    2019 novel coronavirus detected    -  Primary   Advice Given About 2019 Novel Coronavirus by Telephone          Patient has had confirmed Covid-19 infection.  Symptoms now resolved except for decreased appetite.  Patient quarantined for appropriate length of time and is now medically cleared to resume work.  Plan: 1. Wear mask with all other human interaction for next 3-4 days.  After this, wear mask with people outside her household  as recommended by CDC. 2. 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 3. Follow-up prn.  Brief DM update: Reinforced with patient that she could decrease Tresiba by 2 units weekly if all am fasting CBGs that week were under 120.  For now, continue to allow sugars to stabilize by focusing on diet and regular activity.  - Time spent in direct consultation with patient via telemedicine about above concerns: 8 minutes  Follow up plan: As needed in next 1-2 weeks for worsening symptoms.  [name], DNP, AGPCNP-BC Adult Gerontology Primary Care  Nurse Practitioner [facility] 10/15/2018, 8:29 AM

## 2018-10-19 ENCOUNTER — Ambulatory Visit (INDEPENDENT_AMBULATORY_CARE_PROVIDER_SITE_OTHER): Payer: PPO | Admitting: Pharmacist

## 2018-10-19 DIAGNOSIS — E1165 Type 2 diabetes mellitus with hyperglycemia: Secondary | ICD-10-CM | POA: Diagnosis not present

## 2018-10-20 NOTE — Patient Instructions (Signed)
Thank you allowing the Chronic Care Management Team to be a part of your care! It was a pleasure speaking with you today!     CCM (Chronic Care Management) Team    Janci Minor RN, BSN Nurse Care Coordinator  [phone]   [name] PharmD  Clinical Pharmacist  [phone]   Eula Fried LCSW Clinical Social Worker [phone]  Visit Information  Goals Addressed            This Visit's Progress   . Medication Assistance       Current Barriers:  . Financial Barriers - currently in coverage gap of Part D coverage  Pharmacist Clinical Goal(s):  Marland Kitchen Over the next 30 days, patient will work with CM Pharmacist to address needs related to medication assistance and medication management optimization  Interventions: . Perform chart review o Patient seen by PCP on 8/28 for follow up after recent diagnosis of Coronavirus on 10/02/18 . Discuss effect of recent illness on diet and blood sugar o Ms. Tarquinio reports recent fasting CBGs of 110-125 mg/dL . Inquire about previous complaint of fingertip soreness with checking her blood sugar. Ms. Fileccia reports improvement in comfort of CBG testing with changes in monitoring technique, not testing directly on center pad of fingertip and rotating fingers. Nash Dimmer with [place] to aid Ms. Gilmore in applying for patient assistance for Ozempic and [name] through [employer] patient assistance program o Per [facility] CPh[facility] 8/21 note, patient approved for program, order was placed with the pharmacy on 8/20 and that patient to receive a 4 month supply of medication in the next 2-3 weeks. o Ms. Finer reports she received a call from the office letting her know that this medication has arrived at the office and she will go to pick it up  Patient Self Care Activities:  . Self administers medications as prescribed . Making dietary changes to help control her blood sugar: o focusing on portion sizes o changing  additives that she uses when cooking o avoiding dietary indiscretions . Attends all scheduled provider appointments . Calls pharmacy for medication refills . Calls provider office for new concerns or questions . Checks blood sugars as directed o Keeps log in App on phone  Please see past updates related to this goal by clicking on the "Past Updates" button in the selected goal         The patient verbalized understanding of instructions provided today and declined a print copy of patient instruction materials.   The care management team will reach out to the patient again over the next 30 days.   [name], PharmD, [place] [employer] [phone]

## 2018-10-20 NOTE — Chronic Care Management (AMB) (Signed)
Chronic Care Management   Follow Up Note   10/20/2018 Name: Summer Young MRN: [id number] DOB: 1950-07-12  Referred by: [name], NP Reason for referral : Chronic Care Management (Patient Phone Call)   Summer Young is a 68 y.o. year old female who is a primary care patient of [name], NP. The CCM team was consulted for assistance with chronic disease management and care coordination needs.  Ms. Kvale has a past medical history including but not limited to type 2 diabetes, low back pain and hypertension.  I reached out to [facility] by phone today.   Review of patient status, including review of consultants reports, relevant laboratory and other test results, and collaboration with appropriate care team members and the patient's provider was performed as part of comprehensive patient evaluation and provision of chronic care management services.    SDOH (Social Determinants of Health) screening performed today: Financial Strain . See Care Plan for related entries.   Outpatient Encounter Medications as of 10/19/2018  Medication Sig Note  . insulin degludec (TRESIBA FLEXTOUCH) 100 UNIT/ML SOPN FlexTouch Pen Inject 0.26 mLs (26 Units total) into the skin daily.   . metFORMIN (GLUCOPHAGE-XR) 500 MG 24 hr tablet Take 3 tablets (1,500 mg total) by mouth daily with breakfast.   . Semaglutide, 1 MG/DOSE, (OZEMPIC, 1 MG/DOSE,) 2 MG/1.5ML SOPN Inject 1 mg into the skin once a week.   . Ascorbic Acid (VITAMIN C) 1000 MG tablet Take 1,000 mg by mouth daily. 09/20/2018: As needed to prevent illness  . atorvastatin (LIPITOR) 40 MG tablet Take 1 tablet (40 mg total) by mouth daily.   . Biotin 1000 MCG tablet Take by mouth every other day.  01/19/2018: Takes every other day  . Cholecalciferol (VITAMIN D3) 50 MCG (2000 UT) capsule Take 1,000 Units by mouth daily.    . Insulin Pen Needle (PEN NEEDLES) 32G X 5 MM MISC 1 Device by Does not apply route daily.   . meloxicam (MOBIC) 15 MG  tablet Take 15 mg by mouth daily. prn   . spironolactone (ALDACTONE) 25 MG tablet Take 1 tablet (25 mg total) by mouth daily.   . vitamin B-12 (CYANOCOBALAMIN) 1000 MCG tablet Take 1,000 mcg by mouth daily.     No facility-administered encounter medications on file as of 10/19/2018.     Goals Addressed            This Visit's Progress   . Medication Assistance       Current Barriers:  . Financial Barriers - currently in coverage gap of Part D coverage  Pharmacist Clinical Goal(s):  Marland Kitchen Over the next 30 days, patient will work with CM Pharmacist to address needs related to medication assistance and medication management optimization  Interventions: . Perform chart review o Patient seen by PCP on 8/28 for follow up after recent diagnosis of Coronavirus on 10/02/18 . Discuss effect of recent illness on diet and blood sugar o Ms. Kauer reports recent fasting CBGs of 110-125 mg/dL . Inquire about previous complaint of fingertip soreness with checking her blood sugar. Ms. Deforest reports improvement in comfort of CBG testing with changes in monitoring technique, not testing directly on center pad of fingertip and rotating fingers. Nash Dimmer with [place] to aid Ms. Maund in applying for patient assistance for Ozempic and [name] through [employer] patient assistance program o Per [facility] 8/21 note, patient approved for program, order was placed with the pharmacy on 8/20 and that  patient to receive a 4 month supply of medication in the next 2-3 weeks. o Ms. Monter reports she received a call from the office letting her know that this medication has arrived at the office and she will go to pick it up  Patient Self Care Activities:  . Self administers medications as prescribed . Making dietary changes to help control her blood sugar: o focusing on portion sizes o changing additives that she uses when cooking o avoiding dietary indiscretions . Attends all scheduled  provider appointments . Calls pharmacy for medication refills . Calls provider office for new concerns or questions . Checks blood sugars as directed o Keeps log in App on phone  Please see past updates related to this goal by clicking on the "Past Updates" button in the selected goal         Plan  The care management team will reach out to the patient again over the next 30 days.   [name], PharmD, [place] [employer] [phone]

## 2018-10-22 ENCOUNTER — Encounter: Payer: Self-pay | Admitting: *Deleted

## 2018-10-27 ENCOUNTER — Other Ambulatory Visit: Payer: Self-pay | Admitting: Pharmacy Technician

## 2018-10-27 NOTE — Patient Outreach (Signed)
[place] [facility]) Care Management  10/27/2018  Summer Young 05/20/1950 [name]  ADDENDUM  Incoming call received from patient who was returning my call.  Spoke to patient, HIPAA identifiers verified.  Patient was calling to inform that she had received a call from Eliseo Gum office informing her that the medication had arrived. Patient informed she hoped to get by later this week or early next week to pick it up. Informed patient that [employer] rep had said she should be receiving 5 boxes of Ozempic and 2 boxes of [place].   Discussed refill procedure with patient. Informed patient the refill has to be called into [employer] by the provider's office as this is their policy. The last refill must be placed by 01/17/2019 every though program enrollment does not end until 02/17/2019. Patient verbalized understanding.  Patient denied having any questions and confirmed with patient that she had name and number.  Will route note to embedded [facility] RPh [name] that patient assistance has been completed and will remove myself from care team.  Luiz Ochoa. [place] Eppinger, [place] Management [phone]

## 2018-10-27 NOTE — Patient Outreach (Signed)
[place] [facility]) Care Management  10/27/2018  Summer Young 1950/05/22 [id number]   Unsuccessful outreach call placed to patient in regards to [employer] application for [employer].  Unfortunately patient did not answer the phone, HIPAA compliant voicemail left.  Was calling patient to inquire if she has received the medication and to discuss refill process.  Will followup with patient in 3-7 business days if call is not returned.  Amari Zagal P. Parys Elenbaas, [name] Management [phone]

## 2018-11-18 ENCOUNTER — Ambulatory Visit (INDEPENDENT_AMBULATORY_CARE_PROVIDER_SITE_OTHER): Payer: PPO | Admitting: Pharmacist

## 2018-11-18 ENCOUNTER — Encounter: Payer: Self-pay | Admitting: Nurse Practitioner

## 2018-11-18 DIAGNOSIS — E1165 Type 2 diabetes mellitus with hyperglycemia: Secondary | ICD-10-CM

## 2018-11-18 NOTE — Chronic Care Management (AMB) (Signed)
Chronic Care Management   Follow Up Note   11/18/2018 Name: Ruth Slusher MRN: [id number] DOB: May 17, 1950  Referred by: [name], NP Reason for referral : Chronic Care Management (Patient Phone Call)   Summer Young is a 68 y.o. year old female who is a primary care patient of [name], NP. The CCM team was consulted for assistance with chronic disease management and care coordination needs.  Ms. Zieger has a past medical history including but not limited to type 2 diabetes, low back pain and hypertension.  I reached out to [facility] by phone today.   Review of patient status, including review of consultants reports, relevant laboratory and other test results, and collaboration with appropriate care team members and the patient's provider was performed as part of comprehensive patient evaluation and provision of chronic care management services.     Outpatient Encounter Medications as of 11/18/2018  Medication Sig Note  . insulin degludec (TRESIBA FLEXTOUCH) 100 UNIT/ML SOPN FlexTouch Pen Inject 0.26 mLs (26 Units total) into the skin daily.   . metFORMIN (GLUCOPHAGE-XR) 500 MG 24 hr tablet Take 3 tablets (1,500 mg total) by mouth daily with breakfast.   . Semaglutide, 1 MG/DOSE, (OZEMPIC, 1 MG/DOSE,) 2 MG/1.5ML SOPN Inject 1 mg into the skin once a week.   . Ascorbic Acid (VITAMIN C) 1000 MG tablet Take 1,000 mg by mouth daily. 09/20/2018: As needed to prevent illness  . atorvastatin (LIPITOR) 40 MG tablet Take 1 tablet (40 mg total) by mouth daily.   . Biotin 1000 MCG tablet Take by mouth every other day.  01/19/2018: Takes every other day  . Cholecalciferol (VITAMIN D3) 50 MCG (2000 UT) capsule Take 1,000 Units by mouth daily.    . Insulin Pen Needle (PEN NEEDLES) 32G X 5 MM MISC 1 Device by Does not apply route daily.   . meloxicam (MOBIC) 15 MG tablet Take 15 mg by mouth daily. prn   . spironolactone (ALDACTONE) 25 MG tablet Take 1 tablet (25 mg total) by  mouth daily.   . vitamin B-12 (CYANOCOBALAMIN) 1000 MCG tablet Take 1,000 mcg by mouth daily.     No facility-administered encounter medications on file as of 11/18/2018.     Goals Addressed            This Visit's Progress   . COMPLETED: PharmD - Medication Assistance       Current Barriers:  . Financial Barriers - currently in coverage gap of Part D coverage  Pharmacist Clinical Goal(s):  Marland Kitchen Over the next 30 days, patient will work with CM Pharmacist to address needs related to medication assistance and medication management optimization  Interventions: . Follow up regarding blood sugar montioring o Ms. Minish reports recent fasting CBGs of 95-120 mg/dLs o Denies any low blood sugars . Collaborated with [facility] CPhT Jill Simcox to aid Ms. Strehlow in applying for patient assistance for Ozempic and [name] through [employer] patient assistance program - patient successfully enrolled o Ms. Sandefer confirms that she picked up both Ozempic and Tresiba from the office. . Listen and provide emotional support as patient describes feelings of isolation due to current pandemic.  o Denies feeling "depressed", stating that she just "feels down" and states "I just needs to pull myself out of it". o Ms. Sura reports that she is interested in seeing a counselor. . Encourage patient to reach out to PCP for referral o Ms. Arnall confirms that she will send PCP a message through [place] .  Will send referral to [facility] Social Worker to follow up with patient regarding mood and resources . Confirm that patient has my phone number saved for future medication questions/concerns  Patient Self Care Activities:  . Self administers medications as prescribed . Making dietary changes to help control her blood sugar: o focusing on portion sizes o changing additives that she uses when cooking o avoiding dietary indiscretions . Attends all scheduled provider appointments . Calls pharmacy for medication  refills . Calls provider office for new concerns or questions . Checks blood sugars as directed o Keeps log in App on phone  Please see past updates related to this goal by clicking on the "Past Updates" button in the selected goal         Plan  The patient has been provided with contact information for the care management team and has been advised to call with any health related questions or concerns.   [name], PharmD, [place] [employer] [phone]

## 2018-11-18 NOTE — Patient Instructions (Signed)
Thank you allowing the Chronic Care Management Team to be a part of your care! It was a pleasure speaking with you today!     CCM (Chronic Care Management) Team    Janci Minor RN, BSN Nurse Care Coordinator  [phone]   [name] PharmD  Clinical Pharmacist  [phone]   Eula Fried LCSW Clinical Social Worker [phone]  Visit Information  Goals Addressed            This Visit's Progress   . COMPLETED: PharmD - Medication Assistance       Current Barriers:  . Financial Barriers - currently in coverage gap of Part D coverage  Pharmacist Clinical Goal(s):  Marland Kitchen Over the next 30 days, patient will work with CM Pharmacist to address needs related to medication assistance and medication management optimization  Interventions: . Follow up regarding blood sugar montioring o Ms. Pandey reports recent fasting CBGs of 95-120 mg/dLs o Denies any low blood sugars . Collaborated with [facility] C[facility] Jill Simcox to aid Ms. Negash in applying for patient assistance for Ozempic and [name] through [employer] patient assistance program - patient successfully enrolled o Ms. Nicole confirms that she picked up both Ozempic and Tresiba from the office. . Patient describes feelings of isolation due to current pandemic.  o Ms. Bahar reports that she is interested in seeing a counselor. . Encourage patient to reach out to PCP for referral o Ms. Misek confirms that she will send PCP a message through [place] . Will send referral to [facility] Social Worker to follow up with patient regarding mood and resources . Confirm that patient has my phone number saved for future medication questions/concerns  Patient Self Care Activities:  . Self administers medications as prescribed . Making dietary changes to help control her blood sugar: o focusing on portion sizes o changing additives that she uses when cooking o avoiding dietary indiscretions . Attends all scheduled provider  appointments . Calls pharmacy for medication refills . Calls provider office for new concerns or questions . Checks blood sugars as directed o Keeps log in App on phone  Please see past updates related to this goal by clicking on the "Past Updates" button in the selected goal         The patient verbalized understanding of instructions provided today and declined a print copy of patient instruction materials.   The patient has been provided with contact information for the care management team and has been advised to call with any health related questions or concerns.   [name], PharmD, [place] [employer] [phone]

## 2018-12-10 ENCOUNTER — Other Ambulatory Visit: Payer: Self-pay | Admitting: Nurse Practitioner

## 2018-12-10 ENCOUNTER — Encounter: Payer: Self-pay | Admitting: Nurse Practitioner

## 2018-12-10 ENCOUNTER — Other Ambulatory Visit: Payer: Self-pay

## 2018-12-10 ENCOUNTER — Ambulatory Visit (INDEPENDENT_AMBULATORY_CARE_PROVIDER_SITE_OTHER): Payer: PPO | Admitting: Nurse Practitioner

## 2018-12-10 VITALS — BP 139/76 | HR 81 | Ht 62.0 in | Wt 168.0 lb

## 2018-12-10 DIAGNOSIS — E785 Hyperlipidemia, unspecified: Secondary | ICD-10-CM | POA: Diagnosis not present

## 2018-12-10 DIAGNOSIS — E1169 Type 2 diabetes mellitus with other specified complication: Secondary | ICD-10-CM

## 2018-12-10 DIAGNOSIS — Z794 Long term (current) use of insulin: Secondary | ICD-10-CM

## 2018-12-10 DIAGNOSIS — I10 Essential (primary) hypertension: Secondary | ICD-10-CM

## 2018-12-10 DIAGNOSIS — E1165 Type 2 diabetes mellitus with hyperglycemia: Secondary | ICD-10-CM | POA: Diagnosis not present

## 2018-12-10 LAB — POCT GLYCOSYLATED HEMOGLOBIN (HGB A1C): Hemoglobin A1C: 7.5 % — AB (ref 4.0–5.6)

## 2018-12-10 MED ORDER — ATORVASTATIN CALCIUM 40 MG PO TABS: 40.0000 mg | ORAL_TABLET | Freq: Every day | ORAL | 1 refills | Status: DC

## 2018-12-10 MED ORDER — METFORMIN HCL ER 500 MG PO TB24: 1500.0000 mg | ORAL_TABLET | Freq: Every day | ORAL | 1 refills | Status: DC

## 2018-12-10 MED ORDER — SPIRONOLACTONE 25 MG PO TABS: 25.0000 mg | ORAL_TABLET | Freq: Every day | ORAL | 1 refills | Status: DC

## 2018-12-10 NOTE — Patient Instructions (Addendum)
Summer Young,   Thank you for coming in to clinic today.  1. A1c is still at goal today.  Better is less than 7%. - STOP drinking Ensure.  Drink Glucerna, low carb ensure, or Atkins protein shakes if you want to continue this. - Keep fruits to 2-3 servings daily - Cut back some on breads, rice, pasta, etc.  2. Continue medications without changes today.  Please schedule a follow-up appointment. Return in about 3 months (around 03/12/2019) for diabetes.  If you have any other questions or concerns, please feel free to call the clinic or send a message through [place]. You may also schedule an earlier appointment if necessary.  You will receive a survey after today's visit either digitally by e-mail or paper by [employer]. Your experiences and feedback matter to [place].  Please respond so we know how we are doing as we provide care for you.  [name], DNP, AGNP-BC Adult Gerontology Nurse Practitioner [place]

## 2018-12-10 NOTE — Progress Notes (Signed)
Subjective:    Patient ID: Summer Young, female    DOB: 1951/01/08, 68 y.o.   MRN: [name]  Ethyle Schoner is a 68 y.o. female presenting on 12/10/2018 for Diabetes  HPI Diabetes Pt presents today for follow up of Type 2 diabetes mellitus. She is checking fasting am CBG at home with a range of 85-145, usually 95-110 - Current diabetic medications include: metformin, ozempic, tresiba - She is not currently symptomatic.  - She denies polydipsia, polyphagia, polyuria, headaches, diaphoresis, shakiness, chills, pain, numbness or tingling in extremities and changes in vision.   - Clinical course has been improving. 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 - Her diet is moderate in salt, moderate in fat, and moderate in carbohydrates. - Weight trend: decreasing steadily  PREVENTION: Eye exam current (within one year): yes - next one scheduled Foot exam current (within one year): yes  Lipid/ASCVD risk reduction - on statin: yes Kidney protection - on ace or arb: no (spironolactone for BP: ace/arb contraindicated) Recent Labs    03/02/18 0835 06/10/18 0824 09/09/18 0818  HGBA1C 8.4* 8.1* 7.3*     Social History   Tobacco Use  . Smoking status: Never Smoker  . Smokeless tobacco: Never Used  Substance Use Topics  . Alcohol use: Yes    Alcohol/week: 0.0 - 1.0 standard drinks    Comment: occasional  . Drug use: Never    Review of Systems Per HPI unless specifically indicated above     Objective:    BP 139/76   Pulse 81   Ht 5\' 2"  (1.575 m)   Wt 168 lb (76.2 kg)   BMI 30.73 kg/m   Wt Readings from Last 3 Encounters:  12/10/18 168 lb (76.2 kg)  09/16/18 172 lb 6.4 oz (78.2 kg)  09/09/18 173 lb (78.5 kg)    Physical Exam Vitals signs reviewed.  Constitutional:      General: She is not in acute distress.    Appearance: Normal appearance. She is well-developed.  HENT:     Head: Normocephalic and atraumatic.  Neck:   Vascular: No carotid bruit.  Cardiovascular:     Rate and Rhythm: Normal rate and regular rhythm.     Pulses: Normal pulses.     Heart sounds: Normal heart sounds. No murmur. No friction rub. No gallop.   Pulmonary:     Effort: Pulmonary effort is normal.     Breath sounds: Normal breath sounds.  Skin:    General: Skin is warm and dry.     Capillary Refill: Capillary refill takes less than 2 seconds.  Neurological:     General: No focal deficit present.     Mental Status: She is alert and oriented to person, place, and time. Mental status is at baseline.  Psychiatric:        Mood and Affect: Mood normal.        Behavior: Behavior normal.        Thought Content: Thought content normal.        Judgment: Judgment normal.    Results for orders placed or performed in visit on 09/16/18  Cytology - PAP  Result Value Ref Range   Adequacy      Satisfactory for evaluation  endocervical/transformation zone component PRESENT.   Diagnosis      NEGATIVE FOR INTRAEPITHELIAL LESIONS OR MALIGNANCY.   HPV NOT Detected    Material Submitted CervicoVaginal Pap [ThinPrep Imaged]    CYTOLOGY -  PAP PAP RESULT       Assessment & Plan:   Problem List Items Addressed This Visit      Endocrine   Diabetes mellitus ([place]) - Primary   Relevant Orders   POCT glycosylated hemoglobin (Hb A1C) (Completed)   Lipid panel   Comprehensive metabolic panel    ControlledDM with A1c stable from last visit and at initial goal < 8%.  Final goal A1c < 7.0%. - Suspect postprandial hyperglycemia persists as fasting CBGs are in range. - Complications - mixed hyperlipidemia and hyperglycemia.  Plan:  1. Continue current therapy: for medications metformin, Tresiba, Ozempic 2. Encourage improved lifestyle as primary target for A1c reduction at this time - low carb/low glycemic diet reinforced prior education.  Stop Ensure.  Reduce breads, rice, pasta.  Patient verbalizes understanding and presents options for  reduction. - Increase physical activity to 30 minutes most days of the week.  Explained that increased physical activity increases body's use of sugar for energy. 3. Check fasting am CBG and bring log to next visit for review 4. Continue Statin 5. Advised to schedule DM ophtho exam, send record. 6. Follow-up 3-4 months     Follow up plan: Return in about 3 months (around 03/12/2019) for diabetes.  [name], DNP, AGPCNP-BC Adult Gerontology Primary Care Nurse Practitioner [facility] 12/10/2018, 10:11 AM

## 2018-12-13 ENCOUNTER — Ambulatory Visit: Payer: PPO | Admitting: Nurse Practitioner

## 2018-12-17 NOTE — Progress Notes (Signed)
I have reviewed this encounter including the documentation in this note and/or discussed this patient with the provider. I am certifying that I agree with the content of this note as supervising physician.  Nobie Putnam, [facility] 12/17/2018, 11:02 PM

## 2018-12-23 ENCOUNTER — Ambulatory Visit: Payer: PPO | Admitting: Licensed Clinical Social Worker

## 2018-12-23 DIAGNOSIS — F419 Anxiety disorder, unspecified: Secondary | ICD-10-CM

## 2018-12-23 DIAGNOSIS — E1165 Type 2 diabetes mellitus with hyperglycemia: Secondary | ICD-10-CM

## 2018-12-23 NOTE — Chronic Care Management (AMB) (Signed)
Chronic Care Management    Clinical Social Work Follow Up Note  12/23/2018 Name: Yendi Toon MRN: [id number] DOB: February 03, 1951  Cona Detoro is a 68 y.o. year old female who is a primary care patient of [name], NP (Inactive). The CCM team was consulted for assistance with Mental Health Counseling and Resources.   Review of patient status, including review of consultants reports, other relevant assessments, and collaboration with appropriate care team members and the patient's provider was performed as part of comprehensive patient evaluation and provision of chronic care management services.    SDOH (Social Determinants of Health) screening performed today: Stress. See Care Plan for related entries.   Advanced Directives Status: <no information> See Care Plan for related entries.   Outpatient Encounter Medications as of 12/23/2018  Medication Sig Note  . Ascorbic Acid (VITAMIN C) 1000 MG tablet Take 1,000 mg by mouth daily. 09/20/2018: As needed to prevent illness  . atorvastatin (LIPITOR) 40 MG tablet Take 1 tablet (40 mg total) by mouth daily.   . Biotin 1000 MCG tablet Take 4,000 mcg by mouth every other day.  01/19/2018: Takes every other day  . Cholecalciferol (VITAMIN D3) 50 MCG (2000 UT) capsule Take 1,000 Units by mouth daily.    . insulin degludec (TRESIBA FLEXTOUCH) 100 UNIT/ML SOPN FlexTouch Pen Inject 0.26 mLs (26 Units total) into the skin daily.   . metFORMIN (GLUCOPHAGE-XR) 500 MG 24 hr tablet Take 3 tablets (1,500 mg total) by mouth daily with breakfast.   . Semaglutide, 1 MG/DOSE, (OZEMPIC, 1 MG/DOSE,) 2 MG/1.5ML SOPN Inject 1 mg into the skin once a week.   . spironolactone (ALDACTONE) 25 MG tablet Take 1 tablet (25 mg total) by mouth daily.   . vitamin B-12 (CYANOCOBALAMIN) 1000 MCG tablet Take 1,000 mcg by mouth daily.     No facility-administered encounter medications on file as of 12/23/2018.      Goals Addressed    . "I'm having a lot of anxiety right  now." (pt-stated)       Current Barriers:  . Acute Mental Health needs related to anxiety/stress . Limited social support . Mental Health Concerns  . Family and relationship dysfunction . Social Isolation . Suicidal Ideation/Homicidal Ideation: No  Clinical Social Work Goal(s):  Marland Kitchen Over the next 120 days, patient will work with SW  bi-monthly  by telephone or in person to reduce or manage symptoms related to anxiety  . Over the next 120 days, patient will work with SW to address concerns related to gaining mental health support and develop better coping skills to manage symptoms  Interventions: . Patient interviewed and appropriate assessments performed: GAD 7 and PHQ 9 . LCSW provided education on relaxation techniques such as meditation, deep breathing, massage, or yoga that can activate the body's relaxation response and ease symptoms of acute anxiety. LCSW ask that when pt is struggling with difficult emotions and stress that she start this relaxation response process.  . Discussed plans with patient for ongoing care management follow up and provided patient with direct contact information for care management team . Advised patient to contact CCM program for any urgent case management needs . Assisted patient/caregiver with obtaining information about health plan benefits . A voluntary and extensive discussion about advanced care planning including explanation and discussion of advanced was undertaken with the patient. Explanation regarding healthcare proxy and living will was reviewed and packet with forms with explanation of how to fill them out was given.   . Encouraged  patient to consider long term follow up therapy/counseling . Brief CBTprovided during session  . LCSW sent secure email with resources discussed today during outreach [facility] support)  Patient Self Care Activities:  . Attends all scheduled provider appointments  Patient Coping Strengths:  . Spirituality . Hopefulness   Patient Self Care Deficits:  . Lack of socialization and strong support network  Initial goal documentation     Follow Up Plan: SW will follow up with patient by phone over the next quarter  Eula Fried, [place], MSW, [email or web address] Phone: [phone]

## 2018-12-27 DIAGNOSIS — E119 Type 2 diabetes mellitus without complications: Secondary | ICD-10-CM | POA: Diagnosis not present

## 2018-12-27 LAB — HM DIABETES EYE EXAM

## 2018-12-28 ENCOUNTER — Encounter: Payer: Self-pay | Admitting: Family Medicine

## 2018-12-30 ENCOUNTER — Telehealth: Payer: Self-pay | Admitting: Nurse Practitioner

## 2018-12-30 DIAGNOSIS — M545 Low back pain, unspecified: Secondary | ICD-10-CM

## 2018-12-30 DIAGNOSIS — G8929 Other chronic pain: Secondary | ICD-10-CM

## 2018-12-30 DIAGNOSIS — M25562 Pain in left knee: Secondary | ICD-10-CM

## 2018-12-30 MED ORDER — MELOXICAM 15 MG PO TABS: 15.0000 mg | ORAL_TABLET | Freq: Every day | ORAL | 2 refills | Status: DC | PRN

## 2018-12-30 NOTE — Telephone Encounter (Signed)
Patient stated the medication is called Meloxicam that she would like refilled. She would like a call back when this has been approved or denied. --Elliot Dally, SMA

## 2018-12-30 NOTE — Telephone Encounter (Signed)
Patient Notified

## 2018-12-30 NOTE — Telephone Encounter (Signed)
Ordered Meloxicam 15mg  daily as needed, should not take every day, prefer to take only intermittently.  Please notify patient that rx was sent to her pharmacy.  Nobie Putnam, [facility] 12/30/2018, 1:31 PM

## 2018-12-30 NOTE — Telephone Encounter (Signed)
Pt called requesting refill on medication for back pain (name of medication was not given)

## 2019-01-04 ENCOUNTER — Ambulatory Visit: Payer: Self-pay | Admitting: Pharmacist

## 2019-01-04 DIAGNOSIS — E1165 Type 2 diabetes mellitus with hyperglycemia: Secondary | ICD-10-CM

## 2019-01-04 NOTE — Patient Instructions (Signed)
Thank you allowing the Chronic Care Management Team to be a part of your care! It was a pleasure speaking with you today!     CCM (Chronic Care Management) Team    Janci Minor RN, BSN Nurse Care Coordinator  [phone]   [name] PharmD  Clinical Pharmacist  [phone]   Eula Fried LCSW Clinical Social Worker [phone]  Visit Information  Goals Addressed            This Visit's Progress   . PharmD - Medication Assistance       Current Barriers:  . Financial Barriers   Pharmacist Clinical Goal(s):  Marland Kitchen Over the next 30 days, patient will work with CM Pharmacist to address needs related to medication assistance and diabetes management  Interventions: . Follow up regarding blood sugar control and montioring o Reports currently taking - Tresiba 26 units once daily - Metformin ER 500 mg - 3 tablets daily with breakfast - Ozempic 1 mg once weekly o Ms. Gunnerson reports recent fasting CBGs of 84-100s - Denies any symptoms of low blood sugars - Reiterate with patient instruction from NP [name], patient directed she may "decrease Tresiba by 2 units weekly if all am fasting CBGs that week were under 120" - Ms. Hnat reports that she will decrease Tresiba dose to 24 units once daily . Counsel patient on continued importance of regular and well balanced meals o Patient confirms understanding  o States she has been limiting portion sizes of carbohydrates and working on increasing intake of vegetables o Reports using protein powder in meals as encouraged by PCP, as she does not eat much meat . Counsel patient further on techniques to improve comfort with CBG testing . Follow up with patient regarding medication assistance o Ms. Montel confirms having sufficient supply of Ozempic and Tresiba to last through end of calendar year. . Schedule appointment with patient in January to reapply for patient assistance for Ozempic and Tresiba through [employer] patient assistance program for 2021 calendar year . Follow up with patient regarding use of meloxicam for arthritis pain. Patient confirms taking only occasionally, as needed, and always with food.  Patient Self Care Activities:  . Self administers medications as prescribed . Making dietary changes to help control her blood sugar: o focusing on portion sizes o avoiding dietary indiscretions . Attends all scheduled provider appointments . Calls pharmacy for medication refills . Calls provider office for new concerns or questions . Checks blood sugars as directed o Keeps log in App on phone  Please see past updates related to this goal by clicking on the "Past Updates" button in the selected goal         The patient verbalized understanding of instructions provided today and declined a print copy of patient instruction materials.   Telephone follow up appointment with care management team member scheduled for: 12/15 at 3:30pm  [name], PharmD, [place] [employer] [phone]

## 2019-01-04 NOTE — Chronic Care Management (AMB) (Signed)
Chronic Care Management   Follow Up Note   01/04/2019 Name: Summer Young MRN: [name] DOB: Jul 13, 1950  Referred by: Summer [employer], NP (Inactive) Reason for referral : Chronic Care Management (Patient Phone Call)   Summer Young is a 68 y.o. year old female who is a primary care patient of Summer [employer], NP (Inactive). The CCM team was consulted for assistance with chronic disease management and care coordination needs.  Summer Young has a past medical history including but not limited to type 2 diabetes, low back pain and hypertension.  I reached out to Summer Young by phone today regarding medication assistance and diabetes management.   Review of patient status, including review of consultants reports, relevant laboratory and other test results, and collaboration with appropriate care team members and the patient's provider was performed as part of comprehensive patient evaluation and provision of chronic care management services.      Outpatient Encounter Medications as of 01/04/2019  Medication Sig Note  . insulin degludec (TRESIBA FLEXTOUCH) 100 UNIT/ML SOPN FlexTouch Pen Inject 0.26 mLs (26 Units total) into the skin daily. (Patient taking differently: Inject 24 Units into the skin daily. )   . metFORMIN (GLUCOPHAGE-XR) 500 MG 24 hr tablet Take 3 tablets (1,500 mg total) by mouth daily with breakfast.   . Semaglutide, 1 MG/DOSE, (OZEMPIC, 1 MG/DOSE,) 2 MG/1.5ML SOPN Inject 1 mg into the skin once a week.   . Ascorbic Acid (VITAMIN C) 1000 MG tablet Take 1,000 mg by mouth daily. 09/20/2018: As needed to prevent illness  . atorvastatin (LIPITOR) 40 MG tablet Take 1 tablet (40 mg total) by mouth daily.   . Biotin 1000 MCG tablet Take 4,000 mcg by mouth every other day.  01/19/2018: Takes every other day  . Cholecalciferol (VITAMIN D3) 50 MCG (2000 UT) capsule Take 1,000 Units by mouth daily.    . meloxicam (MOBIC) 15 MG tablet Take 1 tablet (15 mg total) by mouth daily as  needed for pain.   Marland Kitchen spironolactone (ALDACTONE) 25 MG tablet Take 1 tablet (25 mg total) by mouth daily.   . vitamin B-12 (CYANOCOBALAMIN) 1000 MCG tablet Take 1,000 mcg by mouth daily.     No facility-administered encounter medications on file as of 01/04/2019.     Goals Addressed            This Visit's Progress   . PharmD - Medication Assistance       Current Barriers:  . Financial Barriers   Pharmacist Clinical Goal(s):  Marland Kitchen Over the next 30 days, patient will work with CM Pharmacist to address needs related to medication assistance and diabetes management  Interventions: . Follow up regarding blood sugar control and montioring o Reports currently taking - Tresiba 26 units once daily - Metformin ER 500 mg - 3 tablets daily with breakfast - Ozempic 1 mg once weekly o Summer Young reports recent fasting CBGs of 84-100s - Denies any symptoms of low blood sugars - Reiterate with patient instruction from NP [name], patient directed she may "decrease Tresiba by 2 units weekly if all am fasting CBGs that week were under 120" - Summer Young reports that she will decrease Tresiba dose to 24 units once daily . Counsel patient on continued importance of regular and well balanced meals o Patient confirms understanding  o States she has been limiting portion sizes of carbohydrates and working on increasing intake of vegetables o Reports using protein powder in meals as encouraged by PCP, as she does  not eat much meat . Counsel patient further on techniques to improve comfort with CBG testing . Follow up with patient regarding medication assistance o Summer Young confirms having sufficient supply of Ozempic and Tresiba to last through end of calendar year. . Schedule appointment with patient in January to reapply for patient assistance for Ozempic and Tresiba through [employer] patient assistance program for 2021 calendar year . Follow up with patient regarding use of meloxicam  for arthritis pain. Patient confirms taking only occasionally, as needed, and always with food.  Patient Self Care Activities:  . Self administers medications as prescribed . Making dietary changes to help control her blood sugar: o focusing on portion sizes o avoiding dietary indiscretions . Attends all scheduled provider appointments . Calls pharmacy for medication refills . Calls provider office for new concerns or questions . Checks blood sugars as directed o Keeps log in App on phone  Please see past updates related to this goal by clicking on the "Past Updates" button in the selected goal         Plan  Telephone follow up appointment with care management team member scheduled for: 12/15 at 3:30pm  Summer Young, PharmD, [place] [phone]

## 2019-01-26 NOTE — Telephone Encounter (Signed)
The pt called as well as sending an [employer]. She complains of nausea, lightheadedness and dizziness almost immediately after taking the Meloxicam 15MG  and it last for about 30 minutes and subsided. She notice the symptoms yesterday morning and then again this morning. The medication is PRN for back pain, but previously when she took the medication she only took a 1/2 tablet 7.5 MG. She state that her back pain has worsen because of her workload. 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 I told her to hold the Meloxicam for now until I give her a call back with your recommendations.

## 2019-02-01 ENCOUNTER — Ambulatory Visit: Payer: Self-pay | Admitting: Pharmacist

## 2019-02-01 ENCOUNTER — Telehealth: Payer: Self-pay

## 2019-02-01 NOTE — Chronic Care Management (AMB) (Signed)
  Chronic Care Management   Follow Up Note   02/01/2019 Name: Summer Young MRN: [name] DOB: January 29, 1951  Referred by: [name], NP (Inactive) Reason for referral : Chronic Care Management (Patient Phone Call)   [name] is a 68 y.o. year old female who is a primary care patient of [name], NP (Inactive). The CCM team was consulted for assistance with chronic disease management and care coordination needs.  Ms. Ashbridge has a past medical history including but not limited to type 2 diabetes, low back pain and hypertension.  Was unable to reach patient via telephone today and have left HIPAA compliant voicemail asking patient to return my call.    Plan  The care management team will reach out to the patient again over the next 30 days.   [name], PharmD, [place] [employer] [phone]

## 2019-02-08 ENCOUNTER — Ambulatory Visit: Payer: Self-pay | Admitting: Pharmacist

## 2019-02-08 NOTE — Chronic Care Management (AMB) (Signed)
  Chronic Care Management   Follow Up Note   02/08/2019 Name: Daejanae Dobey MRN: [name] DOB: Oct 06, 1950  Referred by: [name], NP (Inactive) Reason for referral : Chronic Care Management (Patient Phone Call)   Rashidah Tortoriello is a 68 y.o. year old female who is a primary care patient of [name], NP (Inactive). The CCM team was consulted for assistance with chronic disease management and care coordination needs.  Ms. Ishihara has a past medical history including but not limited to type 2 diabetes, low back pain and hypertension.  Receive a voicemail from Ms. Krysinski returning my call from 12/15 and requesting a call back.  Was unable to reach patient via telephone today and have left HIPAA compliant voicemail asking patient to return my call.    Plan  The care management team will reach out to the patient again over the next 30 days.   [name], PharmD, [place] [employer] [phone]

## 2019-02-14 ENCOUNTER — Ambulatory Visit (INDEPENDENT_AMBULATORY_CARE_PROVIDER_SITE_OTHER): Payer: PPO | Admitting: Pharmacist

## 2019-02-14 DIAGNOSIS — E785 Hyperlipidemia, unspecified: Secondary | ICD-10-CM | POA: Diagnosis not present

## 2019-02-14 DIAGNOSIS — E1169 Type 2 diabetes mellitus with other specified complication: Secondary | ICD-10-CM | POA: Diagnosis not present

## 2019-02-14 DIAGNOSIS — E1165 Type 2 diabetes mellitus with hyperglycemia: Secondary | ICD-10-CM

## 2019-02-14 NOTE — Patient Instructions (Signed)
Thank you allowing the Chronic Care Management Team to be a part of your care! It was a pleasure speaking with you today!     CCM (Chronic Care Management) Team    Janci Minor RN, BSN Nurse Care Coordinator  [phone]   [name] PharmD  Clinical Pharmacist  [phone]   Eula Fried LCSW Clinical Social Worker [phone]  Visit Information  Goals Addressed            This Visit's Progress   . PharmD - Medication Assistance       Current Barriers:  . Financial Barriers   Pharmacist Clinical Goal(s):  Marland Kitchen Over the next 30 days, patient will work with CM Pharmacist to address needs related to medication assistance and diabetes management  Interventions: . Follow up regarding blood sugar control and montioring o Reports currently taking - Tresiba 24 units once daily - Metformin ER 500 mg - 3 tablets daily with breakfast - Ozempic 1 mg once weekly o Ms. Balkcom reports fasting CBGs of 80-113 mg/dL for past week - Denies any symptoms of low blood sugars - Reiterate with patient instruction from NP [name], patient directed she may "decrease Tresiba by 2 units weekly if all am fasting CBGs that week were under 120" - Ms. Sollars reports that she will decrease Tresiba dose to 22 units once daily . Discuss with patient continued importance of regular and well balanced meals o States she has been limiting portion sizes of carbohydrates and working on increasing intake of vegetables o Notes importance of gradual dietary changes for her to be successful . Follow up with patient regarding medication assistance o Ms. Talamantes confirms having sufficient supply of Ozempic and Tresiba to last through end of calendar year. o Confirm appointment in January to reapply for patient assistance for Ozempic and [name] through [employer] patient assistance program for 2021 calendar year . Counsel on importance of medication adherence to atorvastatin o Based on  medication dispensing history from chart, patient last refilled atorvastatin on 8/26 for 90 day supply o Ms. Wassmann reports taking atorvastatin 40 mg once daily as directed o Patient confirms that she will review bottle to confirm whether in need of a refill . Follow up with patient regarding nausea and lightheadedness with meloxicam. o Note patient contacted office on 12/9 about side effects with meloxicam o Ms. Hammell denies having needed to take meloxicam since, but verbalizes understanding of instructions from provider  Patient Self Care Activities:  . Self administers medications as prescribed . Making dietary changes to help control her blood sugar: o focusing on portion sizes o avoiding dietary indiscretions . Attends all scheduled provider appointments . Calls pharmacy for medication refills . Calls provider office for new concerns or questions . Checks blood sugars as directed o Keeps log in App on phone  Please see past updates related to this goal by clicking on the "Past Updates" button in the selected goal         The patient verbalized understanding of instructions provided today and declined a print copy of patient instruction materials.   Telephone follow up appointment with care management team member scheduled for: 1/4 at 3:30 pm  [name], PharmD, [place] [phone]

## 2019-02-14 NOTE — Chronic Care Management (AMB) (Signed)
Chronic Care Management   Follow Up Note   02/14/2019 Name: Summer Young MRN: [name] DOB: 1950-12-18  Referred by: [name], NP (Inactive) Reason for referral : Chronic Care Management (Patient Phone call)   Summer Young is a 68 y.o. year old female who is a primary care patient of [name], NP (Inactive). The CCM team was consulted for assistance with chronic disease management and care coordination needs.  Summer Young has a past medical history including but not limited to type 2 diabetes, low back pain and hypertension.  Receive incoming call from [facility] today.   Review of patient status, including review of consultants reports, relevant laboratory and other test results, and collaboration with appropriate care team members and the patient's provider was performed as part of comprehensive patient evaluation and provision of chronic care management services.     Outpatient Encounter Medications as of 02/14/2019  Medication Sig Note  . atorvastatin (LIPITOR) 40 MG tablet Take 1 tablet (40 mg total) by mouth daily.   . insulin degludec (TRESIBA FLEXTOUCH) 100 UNIT/ML SOPN FlexTouch Pen Inject 0.26 mLs (26 Units total) into the skin daily. (Patient taking differently: Inject 24 Units into the skin daily. )   . metFORMIN (GLUCOPHAGE-XR) 500 MG 24 hr tablet Take 3 tablets (1,500 mg total) by mouth daily with breakfast.   . Semaglutide, 1 MG/DOSE, (OZEMPIC, 1 MG/DOSE,) 2 MG/1.5ML SOPN Inject 1 mg into the skin once a week.   . Ascorbic Acid (VITAMIN C) 1000 MG tablet Take 1,000 mg by mouth daily. 09/20/2018: As needed to prevent illness  . Biotin 1000 MCG tablet Take 4,000 mcg by mouth every other day.  01/19/2018: Takes every other day  . Cholecalciferol (VITAMIN D3) 50 MCG (2000 UT) capsule Take 1,000 Units by mouth daily.    . meloxicam (MOBIC) 15 MG tablet Take 1 tablet (15 mg total) by mouth daily as needed for pain. (Patient not taking: Reported on  02/14/2019)   . spironolactone (ALDACTONE) 25 MG tablet Take 1 tablet (25 mg total) by mouth daily.   . vitamin B-12 (CYANOCOBALAMIN) 1000 MCG tablet Take 1,000 mcg by mouth daily.     No facility-administered encounter medications on file as of 02/14/2019.    Goals Addressed            This Visit's Progress   . PharmD - Medication Assistance       Current Barriers:  . Financial Barriers   Pharmacist Clinical Goal(s):  Marland Kitchen Over the next 30 days, patient will work with CM Pharmacist to address needs related to medication assistance and diabetes management  Interventions: . Follow up regarding blood sugar control and montioring o Reports currently taking - Tresiba 24 units once daily - Metformin ER 500 mg - 3 tablets daily with breakfast - Ozempic 1 mg once weekly o Summer Young reports fasting CBGs of 80-113 mg/dL for past week - Denies any symptoms of low blood sugars - Reiterate with patient instruction from NP [name], patient directed she may "decrease Tresiba by 2 units weekly if all am fasting CBGs that week were under 120" - Summer Young reports that she will decrease Tresiba dose to 22 units once daily . Discuss with patient continued importance of regular and well balanced meals o States she has been limiting portion sizes of carbohydrates and working on increasing intake of vegetables o Notes importance of gradual dietary changes for her to be successful . Follow up with patient regarding medication assistance  o Summer Young confirms having sufficient supply of Ozempic and Tresiba to last through end of calendar year. o Confirm appointment in January to reapply for patient assistance for Ozempic and [name] through [employer] patient assistance program for 2021 calendar year . Counsel on importance of medication adherence to atorvastatin o Based on medication dispensing history from chart, patient last refilled atorvastatin on 8/26 for 90 day supply o Ms.  Young reports taking atorvastatin 40 mg once daily as directed o Patient confirms that she will review bottle to confirm whether in need of a refill . Follow up with patient regarding nausea and lightheadedness with meloxicam. o Note patient contacted office on 12/9 about side effects with meloxicam o Dr. Parks Ranger advised patient that she could try taking half of the tablet (7.5 mg) instead or try taking omeprazole (Prilosec OTC) 20 mg once daily when taking meloxicam for relief. o Summer Young denies having needed to take meloxicam since, but verbalizes understanding of instructions from provider  Patient Self Care Activities:  . Self administers medications as prescribed . Making dietary changes to help control her blood sugar: o focusing on portion sizes o avoiding dietary indiscretions . Attends all scheduled provider appointments . Calls pharmacy for medication refills . Calls provider office for new concerns or questions . Checks blood sugars as directed o Keeps log in App on phone  Please see past updates related to this goal by clicking on the "Past Updates" button in the selected goal         Plan  Telephone follow up appointment with care management team member scheduled for: 1/4 at 3:30 pm  [name], PharmD, [place] [phone]

## 2019-02-21 ENCOUNTER — Ambulatory Visit (INDEPENDENT_AMBULATORY_CARE_PROVIDER_SITE_OTHER): Payer: PPO | Admitting: Pharmacist

## 2019-02-21 DIAGNOSIS — E1165 Type 2 diabetes mellitus with hyperglycemia: Secondary | ICD-10-CM

## 2019-02-21 NOTE — Chronic Care Management (AMB) (Signed)
Chronic Care Management   Follow Up Note   02/21/2019 Name: Summer Young MRN: [name] DOB: 05/03/1950  Referred by: [name], NP (Inactive) Reason for referral : Chronic Care Management (Patient Phone Call)   Summer Young is a 69 y.o. year old female who is a primary care patient of [name], NP (Inactive). The CCM team was consulted for assistance with chronic disease management and care coordination needs.  Summer Young has a past medical history including but not limited to type 2 diabetes, low back pain and hypertension.  I reached out to [facility] by phone today.   Review of patient status, including review of consultants reports, relevant laboratory and other test results, and collaboration with appropriate care team members and the patient's provider was performed as part of comprehensive patient evaluation and provision of chronic care management services.     Outpatient Encounter Medications as of 02/21/2019  Medication Sig Note  . insulin degludec (TRESIBA FLEXTOUCH) 100 UNIT/ML SOPN FlexTouch Pen Inject 0.26 mLs (26 Units total) into the skin daily. (Patient taking differently: Inject 24 Units into the skin daily. )   . meloxicam (MOBIC) 15 MG tablet Take 1 tablet (15 mg total) by mouth daily as needed for pain.   . metFORMIN (GLUCOPHAGE-XR) 500 MG 24 hr tablet Take 3 tablets (1,500 mg total) by mouth daily with breakfast.   . Semaglutide, 1 MG/DOSE, (OZEMPIC, 1 MG/DOSE,) 2 MG/1.5ML SOPN Inject 1 mg into the skin once a week.   . Ascorbic Acid (VITAMIN C) 1000 MG tablet Take 1,000 mg by mouth daily. 09/20/2018: As needed to prevent illness  . atorvastatin (LIPITOR) 40 MG tablet Take 1 tablet (40 mg total) by mouth daily.   . Biotin 1000 MCG tablet Take 4,000 mcg by mouth every other day.  01/19/2018: Takes every other day  . Cholecalciferol (VITAMIN D3) 50 MCG (2000 UT) capsule Take 1,000 Units by mouth daily.    Marland Kitchen spironolactone (ALDACTONE) 25 MG  tablet Take 1 tablet (25 mg total) by mouth daily.   . vitamin B-12 (CYANOCOBALAMIN) 1000 MCG tablet Take 1,000 mcg by mouth daily.     No facility-administered encounter medications on file as of 02/21/2019.    Goals Addressed            This Visit's Progress   . PharmD - Medication Assistance       Current Barriers:  . Financial Barriers; patient has [employer] insurance and reports copay for Tresiba & Ozempic is cost prohibitive at this time  Pharmacist Clinical Goal(s):  Marland Kitchen Over the next 30 days, patient will work with CM Pharmacist to address needs related to medication assistance and diabetes management  Interventions: . [name] reports worsening back pain, particularly over the past 3-4 days.  o Reports has taken meloxicam 15 mg once daily as needed without sufficient relief - Confirms taking meloxicam with food and denies further side effects with the meloxicam provided she takes in the evenings o Encourage patient in her plans to call PCP office today to schedule appointment . Follow up regarding blood sugar control and montioring o Reports currently taking - Tresiba 24 units once daily - Metformin ER 500 mg - 3 tablets daily with breakfast - Ozempic 1 mg once weekly o [name] reports fasting CBGs of 90s-113 mg/dL over past week - Denies any symptoms of low blood sugars - Reiterate with patient instruction from NP [name], patient directed she "could decrease Tresiba by 2 units  weekly if all am fasting CBGs that week were under 120" . [name] denies having decreased dose to [place] 22 units once daily as she states that she prefers to keep her fasting CBGs ~90-100, rather than allowing fasting level to increase ? 120. o Counsel on concern for increased risk for hypoglycemia . Encourage patient to follow up with Dr. Parks Ranger at upcoming appointment for further guidance on this dosing . Will collaborate with Chatham Simcox regarding aiding  patient with reapplying for patient assistance for Ozempic and Tresiba through [employer] for 2021 calendar year  Patient Self Care Activities:  . Self administers medications as prescribed . Making dietary changes to help control her blood sugar: o focusing on portion sizes o avoiding dietary indiscretions . Attends all scheduled provider appointments . Calls pharmacy for medication refills . Calls provider office for new concerns or questions . Checks blood sugars as directed o Keeps log in App on phone  Please see past updates related to this goal by clicking on the "Past Updates" button in the selected goal         Plan  The care management team will reach out to the patient again over the next 14 days.   [name], PharmD, [place] [employer] [phone]

## 2019-02-21 NOTE — Patient Instructions (Signed)
Thank you allowing the Chronic Care Management Team to be a part of your care! It was a pleasure speaking with you today!     CCM (Chronic Care Management) Team    Janci Minor RN, BSN Nurse Care Coordinator  [phone]   [name] PharmD  Clinical Pharmacist  [phone]   Eula Fried LCSW Clinical Social Worker [phone]  Visit Information  Goals Addressed            This Visit's Progress   . PharmD - Medication Assistance       Current Barriers:  . Financial Barriers; patient has [employer] insurance and reports copay for Tresiba & Ozempic is cost prohibitive at this time  Pharmacist Clinical Goal(s):  Marland Kitchen Over the next 30 days, patient will work with CM Pharmacist to address needs related to medication assistance and diabetes management  Interventions: . Ms. Valentine reports worsening back pain, particularly over the past 3-4 days.  o Reports has taken meloxicam 15 mg once daily as needed without sufficient relief - Confirms taking meloxicam with food and denies further side effects with the meloxicam provided she takes in the evenings o Encourage patient in her plans to call PCP office today to schedule appointment . Follow up regarding blood sugar control and montioring o Reports currently taking - Tresiba 24 units once daily - Metformin ER 500 mg - 3 tablets daily with breakfast - Ozempic 1 mg once weekly o Ms. Forti reports fasting CBGs of 90s-113 mg/dL over past week - Denies any symptoms of low blood sugars - Reiterate with patient instruction from NP [name], patient directed she "could decrease Tresiba by 2 units weekly if all am fasting CBGs that week were under 120" . Ms. Dubeau denies having decreased dose to [place] 22 units once daily as she states that she prefers to keep her fasting CBGs ~90-100, rather than allowing fasting level to increase ? 120. o Counsel on concern for increased risk for hypoglycemia . Encourage  patient to follow up with Dr. Parks Ranger at upcoming appointment for further guidance on this dosing . Will collaborate with Appleby Simcox regarding aiding patient with reapplying for patient assistance for Ozempic and Tresiba through [employer] for 2021 calendar year  Patient Self Care Activities:  . Self administers medications as prescribed . Making dietary changes to help control her blood sugar: o focusing on portion sizes o avoiding dietary indiscretions . Attends all scheduled provider appointments . Calls pharmacy for medication refills . Calls provider office for new concerns or questions . Checks blood sugars as directed o Keeps log in App on phone  Please see past updates related to this goal by clicking on the "Past Updates" button in the selected goal         The patient verbalized understanding of instructions provided today and declined a print copy of patient instruction materials.   The care management team will reach out to the patient again over the next 14 days.   [name], PharmD, [place] [employer] [phone]

## 2019-02-23 ENCOUNTER — Other Ambulatory Visit: Payer: Self-pay | Admitting: Pharmacy Technician

## 2019-02-23 NOTE — Patient Outreach (Signed)
[facility]) Care Management  02/23/2019  Summer Young December 02, 1950 [name]                                       Medication Assistance Referral  Referral From: [facility] Embedded RPh Dorthula Perfect   Medication/Company: Novo Nordisk / [name] and Ozempic Patient application portion:  Mailed [id number] Provider application portion: Faxed  to Dr. Parks Ranger 02/24/2019 Provider address/fax verified via: Office website  Follow up:  Will follow up with patient in 5-15 business days to confirm application(s) have been received.  Monquie Fulgham P. Kess Mcilwain, [place] [facility] [phone]

## 2019-02-24 ENCOUNTER — Ambulatory Visit (INDEPENDENT_AMBULATORY_CARE_PROVIDER_SITE_OTHER): Payer: PPO | Admitting: Family Medicine

## 2019-02-24 ENCOUNTER — Encounter: Payer: Self-pay | Admitting: Family Medicine

## 2019-02-24 ENCOUNTER — Ambulatory Visit
Admission: RE | Admit: 2019-02-24 | Discharge: 2019-02-24 | Disposition: A | Discharge status: Home / Self Care | Payer: PPO | Attending: Family Medicine | Admitting: Family Medicine

## 2019-02-24 ENCOUNTER — Other Ambulatory Visit: Payer: Self-pay

## 2019-02-24 ENCOUNTER — Ambulatory Visit
Admission: RE | Admit: 2019-02-24 | Discharge: 2019-02-24 | Disposition: A | Discharge status: Home / Self Care | Payer: PPO | Source: Ambulatory Visit | Attending: Family Medicine | Admitting: Family Medicine

## 2019-02-24 VITALS — BP 138/86 | HR 84 | Temp 96.9°F | Ht 62.0 in | Wt 168.6 lb

## 2019-02-24 DIAGNOSIS — G8929 Other chronic pain: Secondary | ICD-10-CM

## 2019-02-24 DIAGNOSIS — M545 Low back pain, unspecified: Secondary | ICD-10-CM

## 2019-02-24 DIAGNOSIS — M47816 Spondylosis without myelopathy or radiculopathy, lumbar region: Secondary | ICD-10-CM | POA: Diagnosis not present

## 2019-02-24 MED ORDER — DICLOFENAC SODIUM 1 % EX GEL: 2.0000 g | Freq: Three times a day (TID) | CUTANEOUS | 2 refills | Status: DC | PRN

## 2019-02-24 MED ORDER — BACLOFEN 5 MG PO TABS: 2.5000 mg | ORAL_TABLET | Freq: Every evening | ORAL | 2 refills | Status: DC | PRN

## 2019-02-24 NOTE — Patient Instructions (Signed)
Thank you for coming to the office today.  Stay tuned on back x-ray results from today  1. For your Back Pain - I think that this is due to Muscle Spasms or strain.  2. Recommend topical anti inflammatory Diclofenac (Voltaren can use up to 3 times a day as needed.  3. Recommend Baclofen (Lioresal) 5mg  tablets - cut in half for 2.5 mg at night for muscle relaxant - may make you sedated or sleepy (be careful driving or working on this) if tolerated you can take every 8 hours, half or whole tab 4. May use Tylenol Extra Str 500mg  tabs - may take 1-2 tablets every 6 hours as needed 5. Recommend to start using heating pad on your lower back 1-2x daily for few weeks   This pain may take weeks to months to fully resolve, but hopefully it will respond to the medicine initially. All back injuries (small or serious) are slow to heal since we use our back muscles every day. Be careful with turning, twisting, lifting, sitting / standing for prolonged periods, and avoid re-injury.  If your symptoms significantly worsen with more pain, or new symptoms with weakness in one or both legs, new or different shooting leg pains, numbness in legs or groin, loss of control or retention of urine or bowel movements, please call back for advice and you may need to go directly to the Emergency Department.   Please schedule a Follow-up Appointment to: Return in about 4 weeks (around 03/24/2019) for back pain.  If you have any other questions or concerns, please feel free to call the office or send a message through [place]. You may also schedule an earlier appointment if necessary.  Additionally, you may be receiving a survey about your experience at our office within a few days to 1 week by e-mail or mail. We value your feedback.  Nobie Putnam, DO [facility], [facility]             Low Back Pain Exercises  See other page with pictures of each exercise.  Start with 1 or 2 of these  exercises that you are most comfortable with. Do not do any exercises that cause you significant worsening pain. Some of these may cause some "stretching soreness" but it should go away after you stop the exercise, and get better over time. Gradually increase up to 3-4 exercises as tolerated.  Standing hamstring stretch: Place the heel of your leg on a stool about 15 inches high. Keep your knee straight. Lean forward, bending at the hips until you feel a mild stretch in the back of your thigh. Make sure you do not roll your shoulders and bend at the waist when doing this or you will stretch your lower back instead. Hold the stretch for 15 to 30 seconds. Repeat 3 times. Repeat the same stretch on your other leg.  Cat and camel: Get down on your hands and knees. Let your stomach sag, allowing your back to curve downward. Hold this position for 5 seconds. Then arch your back and hold for 5 seconds. Do 3 sets of 10.  Quadriped Arm/Leg Raises: Get down on your hands and knees. Tighten your abdominal muscles to stiffen your spine. While keeping your abdominals tight, raise one arm and the opposite leg away from you. Hold this position for 5 seconds. Lower your arm and leg slowly and alternate sides. Do this 10 times on each side.  Pelvic tilt: Lie on your back with your  knees bent and your feet flat on the floor. Tighten your abdominal muscles and push your lower back into the floor. Hold this position for 5 seconds, then relax. Do 3 sets of 10.  Partial curl: Lie on your back with your knees bent and your feet flat on the floor. Tighten your stomach muscles and flatten your back against the floor. Tuck your chin to your chest. With your hands stretched out in front of you, curl your upper body forward until your shoulders clear the floor. Hold this position for 3 seconds. Don't hold your breath. It helps to breathe out as you lift your shoulders up. Relax. Repeat 10 times. Build to 3 sets of 10. To challenge  yourself, clasp your hands behind your head and keep your elbows out to the side.  Lower trunk rotation: Lie on your back with your knees bent and your feet flat on the floor. Tighten your abdominal muscles and push your lower back into the floor. Keeping your shoulders down flat, gently rotate your legs to one side, then the other as far as you can. Repeat 10 to 20 times.  Single knee to chest stretch: Lie on your back with your legs straight out in front of you. Bring one knee up to your chest and grasp the back of your thigh. Pull your knee toward your chest, stretching your buttock muscle. Hold this position for 15 to 30 seconds and return to the starting position. Repeat 3 times on each side.  Double knee to chest: Lie on your back with your knees bent and your feet flat on the floor. Tighten your abdominal muscles and push your lower back into the floor. Pull both knees up to your chest. Hold for 5 seconds and repeat 10 to 20 times.

## 2019-02-24 NOTE — Addendum Note (Signed)
Addended by: Olin Hauser on: 02/24/2019 02:02 PM   Modules accepted: Orders

## 2019-02-24 NOTE — Progress Notes (Addendum)
Subjective:    Patient ID: Summer Young, female    DOB: 10-Oct-1950, 69 y.o.   MRN: [name]  Summer Young is a 69 y.o. female presenting on 02/24/2019 for Back Pain (chronic lower back pain. Pt state movement makes the pain worse.)   HPI   LOW BACK PAIN, Chronic Chronic problem. Previously discussed in 07/2018. Today has same complaint, has had recent flare up of back pain Commutes to [place], she works cleaning houses daily, describes stiffness of back in morning Difficulty with taking some medication that can make her sedated or "feels off" including Tylenol and ibuprofen - She was recommended to do PT in past and tried this without significant improvement - Diagnosed with known lumbar arthritis with chronic back pain previously, she does feel worse with working in evening. Describe pain as moderate severity deeper in spine and muscle of low back R>L worse with over activity she often has to bend at work and can't put as much strain on her knees. Significantly improved back pain best with rest after few days off work, she feels great, then pain returns when she starts working and using her back again. - No pain radiating to legs or feet - She did lose significant weight in past year and her knees felt better. S/p L knee surgery already. She has knee arthritis. - Tried heating pad at night, ice, muscle - History of lumbar OA/DJD, no prior back surgery - Denies any fevers/chills, numbness, tingling, weakness, loss of control bladder/bowel incontinence or retention, unintentional wt loss, night sweats  PMH Type 2 Diabetes, improving CBGs, she has log today, has adjusted her insulin Tresiba down to 22 units. CBGs 90-120 avg  Health Maintenance She has allergy to flu vaccine and history of anaphylaxis. She is very hesitant about COVID19 in future.  Depression screen [facility] 2/9 12/23/2018 09/16/2018 02/26/2018  Decreased Interest 1 0 0  Down, Depressed, Hopeless 2 0 0  PHQ - 2 Score 3 0 0    Altered sleeping 2 - -  Tired, decreased energy 1 - -  Change in appetite 1 - -  Feeling bad or failure about yourself  1 - -  Trouble concentrating 0 - -  Moving slowly or fidgety/restless 0 - -  Suicidal thoughts 0 - -  PHQ-9 Score 8 - -  Difficult doing work/chores Somewhat difficult - -    Social History   Tobacco Use  . Smoking status: Never Smoker  . Smokeless tobacco: Never Used  Substance Use Topics  . Alcohol use: Yes    Alcohol/week: 0.0 - 1.0 standard drinks    Comment: occasional  . Drug use: Never    Review of Systems Per HPI unless specifically indicated above     Objective:    BP 138/86 (BP Location: Right Arm, Patient Position: Sitting, Cuff Size: Normal)   Pulse 84   Temp (!) 96.9 F (36.1 C) (Oral)   Ht 5\' 2"  (1.575 m)   Wt 168 lb 9.6 oz (76.5 kg)   BMI 30.84 kg/m   Wt Readings from Last 3 Encounters:  02/24/19 168 lb 9.6 oz (76.5 kg)  12/10/18 168 lb (76.2 kg)  09/16/18 172 lb 6.4 oz (78.2 kg)    Physical Exam Vitals and nursing note reviewed.  Constitutional:      General: She is not in acute distress.    Appearance: She is well-developed. She is not diaphoretic.     Comments: Well-appearing, comfortable, cooperative  HENT:  Head: Normocephalic and atraumatic.  Eyes:     General:        Right eye: No discharge.        Left eye: No discharge.     Conjunctiva/sclera: Conjunctivae normal.  Cardiovascular:     Rate and Rhythm: Normal rate.  Pulmonary:     Effort: Pulmonary effort is normal.  Musculoskeletal:     Comments: Low Back Inspection: Normal appearance, no spinal deformity, symmetrical. Palpation: No tenderness over spinous processes. Non-reproduced tenderness to palpation over R lumbar paraspinal with some hypertonicity. ROM: Full active ROM forward flex / back extension, rotation L/R without discomfort but has some slight stiffness to range of motion Special Testing: Seated SLR negative for radicular pain bilaterally   Strength: Bilateral hip flex/ext 5/5, knee flex/ext 5/5, ankle dorsiflex/plantarflex 5/5 Neurovascular: intact distal sensation to light touch   Skin:    General: Skin is warm and dry.     Findings: No erythema or rash.  Neurological:     Mental Status: She is alert and oriented to person, place, and time.  Psychiatric:        Behavior: Behavior normal.     Comments: Well groomed, good eye contact, normal speech and thoughts       Results for orders placed or performed in visit on 12/28/18  HM DIABETES EYE EXAM  Result Value Ref Range   HM Diabetic Eye Exam No Retinopathy No Retinopathy      Assessment & Plan:   Problem List Items Addressed This Visit    Spondylosis of lumbar region without myelopathy or radiculopathy   Chronic bilateral low back pain without sciatica - Primary   Relevant Orders   DG Lumbar Spine Complete      Subacute on chronic R>L LBP without associated sciatica. Suspect likely due to muscle spasm/strain, without known injury or trauma. In setting of known chronic LBP with DJD, no prior surgery. - No red flag symptoms. Negative SLR for radiculopathy - Inadequate conservative therapy due to intolerance to meds   Plan: 1. Start with Lumbar X-ray today to follow-up, last imaging was 2017  2. Discussed med options after x-ray results, She has intolerance to some NSAIDs including meloxicam, defer oral nsaid now, if consistent with DJD will offer muscle relaxant baclofen 5mg  - can take half or whole QHS PRN and add anti inflammatory topical diclofenac PRN  3. May use Tylenol PRN for breakthrough  4. Encouraged use of heating pad 1-2x daily for now then PRN - Back exercises given, discussed relative rest, stretching as this does seem to help  5.  Follow-up 4-6 weeks if not improved for re-evaluation, trial of PT, and possibly referral to Orthopedic - Future consider meds such as Gabapentin low dose if need  Orders Placed This Encounter  Procedures  .  DG Lumbar Spine Complete    Standing Status:   Future    Number of Occurrences:   1    Standing Expiration Date:   04/23/2020    Order Specific Question:   Reason for Exam (SYMPTOM  OR DIAGNOSIS REQUIRED)    Answer:   chronic low back pain and osteoarthritis, Right > Left pain, no injury    Order Specific Question:   Preferred imaging location?    Answer:   [facility]    Order Specific Question:   Radiology Contrast Protocol - do NOT remove file path    Answer:   \\[email or web address]s.pdf     No orders of the defined types  were placed in this encounter.   Follow up plan: Return in about 4 weeks (around 03/24/2019) for back pain.  Nobie Putnam, [facility] 02/24/2019, 8:13 AM  ----------------------  UPDATE 02/24/19 at 2:00pm    CLINICAL DATA:  Chronic bilateral low back pain, without sciatica.  EXAM: LUMBAR SPINE - COMPLETE 4+ VIEW  COMPARISON:  Report from lumbar spine radiographs 07/10/2015 (images unavailable).  FINDINGS: Five lumbar vertebrae. Alignment is preserved. Lumbar vertebral body height is maintained. No definite pars interarticularis defect identified on oblique radiographs. Mild-to-moderate disc degeneration, greatest at L2-L3, L3-L4 and L5-S1. Multilevel degenerative endplate spurring, greatest at L5-S1. 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  IMPRESSION: No compression deformity.  Lumbar spondylosis as described.   Electronically Signed   By: Kellie Simmering DO   On: 02/24/2019 09:08   Signed By:  Donnal Debar, DO on 02/24/2019  9:08 AM     Reviewed X-ray result, called patient, discussed Lumbar DJD, no acute findings. Proceed with treatment as described above, sent rx Baclofen 5mg  and Diclofenac topical.  F/u PRN  Nobie Putnam, DO [facility] 02/24/2019, 2:02 PM

## 2019-03-04 ENCOUNTER — Telehealth: Payer: Self-pay

## 2019-03-09 ENCOUNTER — Telehealth: Payer: Self-pay

## 2019-03-09 ENCOUNTER — Ambulatory Visit: Payer: Self-pay | Admitting: Pharmacist

## 2019-03-09 ENCOUNTER — Other Ambulatory Visit: Payer: Self-pay | Admitting: Pharmacy Technician

## 2019-03-09 NOTE — Chronic Care Management (AMB) (Signed)
  Chronic Care Management   Follow Up Note   03/09/2019 Name: Summer Young MRN: [name] DOB: [birth year]  Referred by: [name], NP (Inactive) Reason for referral : Chronic Care Management (Patient Phone Call)   Summer Young is a 69 y.o. year old female who is a primary care patient of [name], NP (Inactive). The CCM team was consulted for assistance with chronic disease management and care coordination needs.    I reached out to [facility] by phone today. [name] reports that she is running late today and is still driving home. Reschedule appointment as requested.   Plan  Telephone follow up appointment with care management team member scheduled for: 1/26 at 4 pm  [name], PharmD, [place] [phone]

## 2019-03-09 NOTE — Patient Outreach (Signed)
[facility]) Care Management  03/09/2019  Summer Young February 02, 1951 [name]    Successful call placed to patient regarding patient assistance application(s) for Ozempic and [place] with [employer] , HIPAA identifiers verified.   Patient informed she received and mailed back the application last week.  Follow up:  Will route note to embedded [facility] RPh [name] for case closure if document(s) have not been received in the next 15 business days.  Summer Lie P. Keron Koffman, [place] Management [phone]

## 2019-03-10 ENCOUNTER — Telehealth: Payer: Self-pay

## 2019-03-10 ENCOUNTER — Ambulatory Visit: Payer: Self-pay | Admitting: Licensed Clinical Social Worker

## 2019-03-10 NOTE — Chronic Care Management (AMB) (Signed)
  Care Management   Follow Up Note   03/10/2019 Name: Summer Young MRN: [name] DOB: 11/17/1950  Referred by: [name], NP (Inactive) Reason for referral : [place] is a 69 y.o. year old female who is a primary care patient of [name], NP (Inactive). The care management team was consulted for assistance with care management and care coordination needs.    Review of patient status, including review of consultants reports, relevant laboratory and other test results, and collaboration with appropriate care team members and the patient's provider was performed as part of comprehensive patient evaluation and provision of chronic care management services.    LCSW completed CCM outreach attempt today but was unable to reach patient successfully. A HIPPA compliant voice message was left encouraging patient to return call once available. LCSW rescheduled CCM SW appointment as well.  Eula Fried, BSW, MSW, [facility] Care Management [email or web address] Phone: [phone]

## 2019-03-15 ENCOUNTER — Ambulatory Visit: Payer: Self-pay | Admitting: Pharmacist

## 2019-03-15 DIAGNOSIS — E1165 Type 2 diabetes mellitus with hyperglycemia: Secondary | ICD-10-CM

## 2019-03-15 NOTE — Patient Instructions (Signed)
Thank you allowing the Chronic Care Management Team to be a part of your care! It was a pleasure speaking with you today!     CCM (Chronic Care Management) Team    Noreene Larsson RN, MSN, CCM Nurse Care Coordinator  [phone]   [name] PharmD  Clinical Pharmacist  [phone]   Eula Fried LCSW Clinical Social Worker [phone]  Visit Information  Goals Addressed            This Visit's Progress   . PharmD - Medication Assistance       Current Barriers:  . Financial Barriers  Pharmacist Clinical Goal(s):  Marland Kitchen Over the next 30 days, patient will work with CM Pharmacist to address needs related to medication assistance and diabetes management  Interventions: . Follow up with patient regarding back pain o Reports continues to have some pain, but relief with use of diclofenac gel and baclofen as needed as prescribed by Dr. Parks Ranger - Reports cautious with use of baclofen as she is aware of sedation risk with this medication o Reports doing exercises from provider at home. . Follow up regarding blood sugar control and montioring o Reports currently taking - Tresiba 22 units once daily - Metformin ER 500 mg - 3 tablets daily with breakfast - Ozempic 1 mg once weekly o Ms. Neece reports fasting CBGs of 101-115 mg/dL over past week - Denies any s/s of low blood sugars - Reports decreased Tresiba dose to 22 units since last phone call, following instruction from NP [name], patient directed she "could decrease Tresiba by 2 units weekly if all am fasting CBGs that week were under 120" . Will continue to collaborate with [place] regarding aiding patient with reapplying for patient assistance for Ozempic and Tresiba through [employer] for 2021 calendar year o Ms. Nissen reports having mailed her portion of the application back to [place].  o Patient reports having only 3 doses of Ozempic remaining, will need pen for 2/21 dose . Counsel  on Part D plan. Note patient previously entered coverage gap in 2020. Advise patient that this restarts with calendar year.  o Per [employer] website, Ozempic is a tier 3 option on patient's plan, for a copayment of $45/month or $90/43-month supply o Note next PCP visit scheduled for 2/19 with Cyndia Skeeters, [place] - Patient states if has not receive medication through assistance program by that time, will request new prescription at visit and obtain through local pharmacy  Patient Self Care Activities:  . Self administers medications as prescribed . Making dietary changes to help control her blood sugar: o focusing on portion sizes o avoiding dietary indiscretions . Attends all scheduled provider appointments . Calls pharmacy for medication refills . Calls provider office for new concerns or questions . Checks blood sugars as directed o Keeps log in App on phone  Please see past updates related to this goal by clicking on the "Past Updates" button in the selected goal         The patient verbalized understanding of instructions provided today and declined a print copy of patient instruction materials.   Telephone follow up appointment with care management team member scheduled for: 2/23 at 4 pm  [name], PharmD, [place] [phone]

## 2019-03-15 NOTE — Chronic Care Management (AMB) (Signed)
Chronic Care Management   Follow Up Note   03/15/2019 Name: Summer Young MRN: [name] DOB: 17-Sep-1950  Referred by: [name], NP (Inactive) Reason for referral : Chronic Care Management (Summer Young Phone Call)   Summer Young is a 69 y.o. year old female who is a primary care Summer Young of [name], NP (Inactive). The CCM team was consulted for assistance with chronic disease management and care coordination needs. Summer Young has a past medical history including but not limited to type 2 diabetes, low back pain and hypertension.   I reached out to [facility] by phone today.   Review of Summer Young status, including review of consultants reports, relevant laboratory and other test results, and collaboration with appropriate care team members and the Summer Young's provider was performed as part of comprehensive Summer Young evaluation and provision of chronic care management services.     Outpatient Encounter Medications as of 03/15/2019  Medication Sig Note  . Baclofen 5 MG TABS Take 2.5-5 mg by mouth at bedtime as needed.   . diclofenac Sodium (VOLTAREN) 1 % GEL Apply 2 g topically 3 (three) times daily as needed.   . insulin degludec (TRESIBA FLEXTOUCH) 100 UNIT/ML SOPN FlexTouch Pen Inject 0.26 mLs (26 Units total) into the skin daily. (Summer Young taking differently: Inject 22 Units into the skin daily. )   . metFORMIN (GLUCOPHAGE-XR) 500 MG 24 hr tablet Take 3 tablets (1,500 mg total) by mouth daily with breakfast.   . Semaglutide, 1 MG/DOSE, (OZEMPIC, 1 MG/DOSE,) 2 MG/1.5ML SOPN Inject 1 mg into the skin once a week. 03/15/2019: Taking on Sundays  . Ascorbic Acid (VITAMIN C) 1000 MG tablet Take 1,000 mg by mouth daily. 09/20/2018: As needed to prevent illness  . atorvastatin (LIPITOR) 40 MG tablet Take 1 tablet (40 mg total) by mouth daily.   . Biotin 1000 MCG tablet Take 4,000 mcg by mouth every other day.  01/19/2018: Takes every other day  . Cholecalciferol (VITAMIN D3) 50 MCG  (2000 UT) capsule Take 1,000 Units by mouth daily.    . meloxicam (MOBIC) 15 MG tablet Take 1 tablet (15 mg total) by mouth daily as needed for pain.   Marland Kitchen spironolactone (ALDACTONE) 25 MG tablet Take 1 tablet (25 mg total) by mouth daily.   . vitamin B-12 (CYANOCOBALAMIN) 1000 MCG tablet Take 1,000 mcg by mouth daily.    . vitamin E 400 UNIT capsule Take by mouth.    No facility-administered encounter medications on file as of 03/15/2019.    Goals Addressed            This Visit's Progress   . PharmD - Medication Assistance       Current Barriers:  . Financial Barriers  Pharmacist Clinical Goal(s):  Marland Kitchen Over the next 30 days, Summer Young will work with CM Pharmacist to address needs related to medication assistance and diabetes management  Interventions: . Follow up with Summer Young regarding back pain o Reports continues to have some pain, but relief with use of diclofenac gel and baclofen as needed as prescribed by Dr. Parks Ranger - Reports cautious with use of baclofen as she is aware of sedation risk with this medication o Reports doing exercises from provider at home. . Follow up regarding blood sugar control and montioring o Reports currently taking - Tresiba 22 units once daily - Metformin ER 500 mg - 3 tablets daily with breakfast - Ozempic 1 mg once weekly o Summer Young reports fasting CBGs of 101-115 mg/dL over past week -  Denies any s/s of low blood sugars - Reports decreased Tresiba dose to 22 units since last phone call, following instruction from NP [name], Summer Young directed she "could decrease Tresiba by 2 units weekly if all am fasting CBGs that week were under 120" . Will continue to collaborate with [place] Simcox regarding aiding Summer Young with reapplying for Summer Young assistance for Ozempic and Tresiba through [employer] for 2021 calendar year o Summer Young reports having mailed her portion of the application back to [place].  o Summer Young reports having only  3 doses of Ozempic remaining, will need pen for 2/21 dose . Counsel on Part D plan. Note Summer Young previously entered coverage gap in 2020. Advise Summer Young that this restarts with calendar year.  o Per [employer] website, Ozempic is a tier 3 option on Summer Young's plan, for a copayment of $45/month or $90/78-month supply o Note next PCP visit scheduled for 2/19 with Cyndia Skeeters, [place] - Summer Young states if has not receive medication through assistance program by that time, will request new prescription at visit and obtain through local pharmacy  Summer Young Self Care Activities:  . Self administers medications as prescribed . Making dietary changes to help control her blood sugar: o focusing on portion sizes o avoiding dietary indiscretions . Attends all scheduled provider appointments . Calls pharmacy for medication refills . Calls provider office for new concerns or questions . Checks blood sugars as directed o Keeps log in App on phone  Please see past updates related to this goal by clicking on the "Past Updates" button in the selected goal         Plan  Telephone follow up appointment with care management team member scheduled for: 2/23 at 4 pm  [name], PharmD, [place] [phone]

## 2019-03-17 ENCOUNTER — Other Ambulatory Visit: Payer: Self-pay | Admitting: Pharmacy Technician

## 2019-03-17 NOTE — Patient Outreach (Signed)
[facility]) Care Management  03/17/2019  Beatryce Sistrunk 1950-06-24 [name]   Received both patient and provider portion(s) of patient assistance application(s) for [place] and Ozempic. Faxed completed application and required documents into [employer].  Will follow up with company(ies) in 3-7 business days to check status of application(s).  Lynnetta Tom P. Vince Ainsley, [place] Management [phone]

## 2019-03-22 ENCOUNTER — Other Ambulatory Visit: Payer: Self-pay | Admitting: Pharmacy Technician

## 2019-03-22 ENCOUNTER — Ambulatory Visit: Payer: Self-pay | Admitting: Pharmacist

## 2019-03-22 DIAGNOSIS — E1165 Type 2 diabetes mellitus with hyperglycemia: Secondary | ICD-10-CM

## 2019-03-22 NOTE — Patient Outreach (Signed)
[place] [facility]) Care Management  03/22/2019  Summer Young 1950-08-29 [name]    Care coordination call placed to [place] in regards to patient's application for Ozempic and [place].  Spoke to Hilda Blades who informed patient was APPROVED 03/21/2019-01/17/2020. She informed a 4 month supply of each medication would be delivered to the provider's office in the next 10-14 business days. Provider's office will receive a computer generated fax within 80 days of 03/21/2019 indicating patient is due for a refill. Provider must submit new prescription at that time.  Will follow up with patient in 10-15 business days to confirm receipt of medications.  Bassam Dresch P. Jad Johansson, [place] Management [phone]

## 2019-03-22 NOTE — Patient Instructions (Signed)
Thank you allowing the Chronic Care Management Team to be a part of your care! It was a pleasure speaking with you today!     CCM (Chronic Care Management) Team    Noreene Larsson RN, MSN, CCM Nurse Care Coordinator  [phone]   [name] PharmD  Clinical Pharmacist  [phone]   Eula Fried LCSW Clinical Social Worker [phone]  Visit Information  Goals Addressed            This Visit's Progress   . PharmD - Medication Assistance       Current Barriers:  . Financial Barriers  Pharmacist Clinical Goal(s):  Marland Kitchen Over the next 30 days, patient will work with CM Pharmacist to address needs related to medication assistance and diabetes management  Interventions: . Receive voicemail from Ms. Blakeley  . Receive coordination of care call from [place] Simcox. Reports that she reached out to [employer] patient assistance program today and patient is approved for assistance for Ozempic and Tresiba, medication to be shipped to office in next 10-14 business days. . Place call to [place] to confirm patient has active prescription with refills for Ozempic 1 mg weekly . Follow up with Ms. Dimanche regarding medication assistance. o Inform patient of approval from [employer] for patient assistance for [employer], medication to be shipped to office in next 10-14 business days o Ms. Contee reports has 1 pen remaining for next dose due on 2/7. Will need another pen on 2/14.  o Per [employer] website, Ozempic is a tier 3 option on patient's plan, for a copayment of $45/month o Patient states that she will pick up a 43-month supply of the Ozempic from [place]  Patient Self Care Activities:  . Self administers medications as prescribed . Making dietary changes to help control her blood sugar: o focusing on portion sizes o avoiding dietary indiscretions . Attends all scheduled provider appointments . Calls pharmacy for medication  refills . Calls provider office for new concerns or questions . Checks blood sugars as directed o Keeps log in App on phone  Please see past updates related to this goal by clicking on the "Past Updates" button in the selected goal         The patient verbalized understanding of instructions provided today and declined a print copy of patient instruction materials.   Telephone follow up appointment with care management team member scheduled for: 2/23 at 4 pm  [name], PharmD, [place] [phone]

## 2019-03-22 NOTE — Chronic Care Management (AMB) (Signed)
Chronic Care Management   Follow Up Note   03/22/2019 Name: Summer Young MRN: [name] DOB: 1950-11-05  Referred by: [name], NP (Inactive) Reason for referral : No chief complaint on file.   Summer Young is a 69 y.o. year old female who is a primary care patient of [name], NP (Inactive). The CCM team was consulted for assistance with chronic disease management and care coordination needs. Summer Young has a past medical history including but not limited to type 2 diabetes, low back pain and hypertension.   Receive voicemail from Summer Young requesting a call back regarding medication assistance.  Receive coordination of care call from Summer Young.  I reached out to Summer Young by phone today.   Review of patient status, including review of consultants reports, relevant laboratory and other test results, and collaboration with appropriate care team members and the patient's provider was performed as part of comprehensive patient evaluation and provision of chronic care management services.     Outpatient Encounter Medications as of 03/22/2019  Medication Sig Note  . Ascorbic Acid (VITAMIN C) 1000 MG tablet Take 1,000 mg by mouth daily. 09/20/2018: As needed to prevent illness  . atorvastatin (LIPITOR) 40 MG tablet Take 1 tablet (40 mg total) by mouth daily.   . Baclofen 5 MG TABS Take 2.5-5 mg by mouth at bedtime as needed.   . Biotin 1000 MCG tablet Take 4,000 mcg by mouth every other day.  01/19/2018: Takes every other day  . Cholecalciferol (VITAMIN D3) 50 MCG (2000 UT) capsule Take 1,000 Units by mouth daily.    . diclofenac Sodium (VOLTAREN) 1 % GEL Apply 2 g topically 3 (three) times daily as needed.   . insulin degludec (TRESIBA FLEXTOUCH) 100 UNIT/ML SOPN FlexTouch Pen Inject 0.26 mLs (26 Units total) into the skin daily. (Patient taking differently: Inject 22 Units into the skin daily. )   . meloxicam (MOBIC) 15 MG tablet Take 1 tablet (15 mg total) by  mouth daily as needed for pain.   . metFORMIN (GLUCOPHAGE-XR) 500 MG 24 hr tablet Take 3 tablets (1,500 mg total) by mouth daily with breakfast.   . Semaglutide, 1 MG/DOSE, (OZEMPIC, 1 MG/DOSE,) 2 MG/1.5ML SOPN Inject 1 mg into the skin once a week. 03/15/2019: Taking on Sundays  . spironolactone (ALDACTONE) 25 MG tablet Take 1 tablet (25 mg total) by mouth daily.   . vitamin B-12 (CYANOCOBALAMIN) 1000 MCG tablet Take 1,000 mcg by mouth daily.    . vitamin E 400 UNIT capsule Take by mouth.    No facility-administered encounter medications on file as of 03/22/2019.    Goals Addressed            This Visit's Progress   . PharmD - Medication Assistance       Current Barriers:  . Financial Barriers  Pharmacist Clinical Goal(s):  Marland Kitchen Over the next 30 days, patient will work with CM Pharmacist to address needs related to medication assistance and diabetes management  Interventions: . Receive voicemail from Summer Young stating that she had previously miscalculated the supply of Ozempic that she had remaining and currently only has one pen left. . Receive coordination of care call from Summer Young. Reports that she reached out to Summer Young today and patient is approved for assistance for Ozempic and Tresiba, medication to be shipped to office in next 10-14 business days. . Place call to [place] to confirm patient has active prescription  with refills for Ozempic 1 mg weekly . Follow up with Summer Young regarding medication assistance. o Inform patient of approval from [employer] for patient assistance for [employer], medication to be shipped to office in next 10-14 business days o Summer Young reports has 1 pen remaining for next dose due on 2/7. Will need another pen on 2/14.  o Per [employer] website, Ozempic is a tier 3 option on patient's plan, for a copayment of $45/month o Patient states that she will pick up a 16-month  supply of the Ozempic from [place]  Patient Self Care Activities:  . Self administers medications as prescribed . Making dietary changes to help control her blood sugar: o focusing on portion sizes o avoiding dietary indiscretions . Attends all scheduled provider appointments . Calls pharmacy for medication refills . Calls provider office for new concerns or questions . Checks blood sugars as directed o Keeps log in App on phone  Please see past updates related to this goal by clicking on the "Past Updates" button in the selected goal         Plan  Telephone follow up appointment with care management team member scheduled for: 2/23 at 4 pm  [name], PharmD, [place] [phone]

## 2019-03-29 ENCOUNTER — Ambulatory Visit (INDEPENDENT_AMBULATORY_CARE_PROVIDER_SITE_OTHER): Payer: PPO | Admitting: Licensed Clinical Social Worker

## 2019-03-29 DIAGNOSIS — F4322 Adjustment disorder with anxiety: Secondary | ICD-10-CM

## 2019-03-29 DIAGNOSIS — E1165 Type 2 diabetes mellitus with hyperglycemia: Secondary | ICD-10-CM | POA: Diagnosis not present

## 2019-03-29 DIAGNOSIS — Z9189 Other specified personal risk factors, not elsewhere classified: Secondary | ICD-10-CM

## 2019-03-30 NOTE — Chronic Care Management (AMB) (Signed)
Chronic Care Management    Clinical Social Work Follow Up Note  03/30/2019 Name: Summer Young MRN: [name] DOB: 01-25-1951  Summer Young is a 69 y.o. year old female who is a primary care patient of [name], NP (Inactive). The CCM team was consulted for assistance with Mental Health Counseling and Resources.   Review of patient status, including review of consultants reports, other relevant assessments, and collaboration with appropriate care team members and the patient's provider was performed as part of comprehensive patient evaluation and provision of chronic care management services.    SDOH (Social Determinants of Health) screening performed today: Stress. See Care Plan for related entries.   Advanced Directives Status: <no information> See Care Plan for related entries.   Outpatient Encounter Medications as of 03/29/2019  Medication Sig Note  . Ascorbic Acid (VITAMIN C) 1000 MG tablet Take 1,000 mg by mouth daily. 09/20/2018: As needed to prevent illness  . atorvastatin (LIPITOR) 40 MG tablet Take 1 tablet (40 mg total) by mouth daily.   . Baclofen 5 MG TABS Take 2.5-5 mg by mouth at bedtime as needed.   . Biotin 1000 MCG tablet Take 4,000 mcg by mouth every other day.  01/19/2018: Takes every other day  . Cholecalciferol (VITAMIN D3) 50 MCG (2000 UT) capsule Take 1,000 Units by mouth daily.    . diclofenac Sodium (VOLTAREN) 1 % GEL Apply 2 g topically 3 (three) times daily as needed.   . insulin degludec (TRESIBA FLEXTOUCH) 100 UNIT/ML SOPN FlexTouch Pen Inject 0.26 mLs (26 Units total) into the skin daily. (Patient taking differently: Inject 22 Units into the skin daily. )   . meloxicam (MOBIC) 15 MG tablet Take 1 tablet (15 mg total) by mouth daily as needed for pain.   . metFORMIN (GLUCOPHAGE-XR) 500 MG 24 hr tablet Take 3 tablets (1,500 mg total) by mouth daily with breakfast.   . Semaglutide, 1 MG/DOSE, (OZEMPIC, 1 MG/DOSE,) 2 MG/1.5ML SOPN Inject 1 mg into the skin  once a week. 03/15/2019: Taking on Sundays  . spironolactone (ALDACTONE) 25 MG tablet Take 1 tablet (25 mg total) by mouth daily.   . vitamin B-12 (CYANOCOBALAMIN) 1000 MCG tablet Take 1,000 mcg by mouth daily.    . vitamin E 400 UNIT capsule Take by mouth.    No facility-administered encounter medications on file as of 03/29/2019.     Goals Addressed    . "I'm having a lot of anxiety right now." (pt-stated)       Current Barriers:  . Acute Mental Health needs related to anxiety/stress . Limited social support . Mental Health Concerns  . Family and relationship dysfunction . Social Isolation . Suicidal Ideation/Homicidal Ideation: No  Clinical Social Work Goal(s):  Marland Kitchen Over the next 120 days, patient will work with SW  bi-monthly  by telephone or in person to reduce or manage symptoms related to anxiety  . Over the next 120 days, patient will work with SW to address concerns related to gaining mental health support and develop better coping skills to manage symptoms  Interventions: . Patient interviewed and appropriate assessments performed: GAD 7 and PHQ 9 . LCSW provided education on relaxation techniques such as meditation, deep breathing, massage, or yoga that can activate the body's relaxation response and ease symptoms of acute anxiety. LCSW ask that when pt is struggling with difficult emotions and stress that she start this relaxation response process.  . Discussed plans with patient for ongoing care management follow up and provided  patient with direct contact information for care management team . Advised patient to contact CCM program for any urgent case management needs . Assisted patient/caregiver with obtaining information about health plan benefits . A voluntary and extensive discussion about advanced care planning including explanation and discussion of advanced was undertaken with the patient. Explanation regarding healthcare proxy and living will was reviewed and packet with  forms with explanation of how to fill them out was given.   . Encouraged patient to consider long term follow up therapy/counseling . Brief CBTprovided during session  . LCSW sent secure email with resources on last outreach. Patient admits that she has not had a chance to look over resources as she has been very busy. Patient shares that her depression has improved due to being occupied. Patient agreeable to review resources soon. LCSW provided resource education.   Patient Self Care Activities:  . Attends all scheduled provider appointments  Patient Coping Strengths:  . Spirituality . Hopefulness  Patient Self Care Deficits:  . Lack of socialization and strong support network  Please see past updates related to this goal by clicking on the "Past Updates" button in the selected goal      Follow Up Plan: SW will follow up with patient by phone over the next quarter.   Eula Fried, BSW, MSW, [email or web address] Phone: [phone]

## 2019-04-07 ENCOUNTER — Other Ambulatory Visit: Payer: Self-pay | Admitting: Pharmacy Technician

## 2019-04-07 NOTE — Patient Outreach (Signed)
[facility]) Care Management  04/07/2019  Summer Young [birth year] [id number]    Successful call placed to patient regarding patient assistance medication delivery of Ozempic and Tresiba, HIPAA identifiers verified.   Patient confirmed picking up a 4 month supply of both medications from the provider's office. Discussed refill procedure with patient which requires the provider's office faxing in a prescription when notified by [employer]'s computer generated fax system alerting them patient's refill is due. Also informed patient to reach out to the provider's office if she has a 2 week supply remaining and has not received a shipment. Informed her to also inform provider's office that she uses the patient assistance company for those 2 medications. Patient verbalized understanding. Confirmed patient had name and number.  Follow up:  Will route note to embedded [place] for case closure  And will remove myself from care team.  Luiz Ochoa. Holdyn Poyser, [place] Management [phone]

## 2019-04-08 ENCOUNTER — Ambulatory Visit (INDEPENDENT_AMBULATORY_CARE_PROVIDER_SITE_OTHER): Payer: PPO | Admitting: Family Medicine

## 2019-04-08 ENCOUNTER — Other Ambulatory Visit: Payer: Self-pay

## 2019-04-08 ENCOUNTER — Encounter: Payer: Self-pay | Admitting: Family Medicine

## 2019-04-08 VITALS — BP 127/80 | HR 81 | Ht 62.0 in | Wt 168.4 lb

## 2019-04-08 DIAGNOSIS — M545 Low back pain, unspecified: Secondary | ICD-10-CM

## 2019-04-08 DIAGNOSIS — G8929 Other chronic pain: Secondary | ICD-10-CM

## 2019-04-08 DIAGNOSIS — E782 Mixed hyperlipidemia: Secondary | ICD-10-CM | POA: Diagnosis not present

## 2019-04-08 DIAGNOSIS — E1165 Type 2 diabetes mellitus with hyperglycemia: Secondary | ICD-10-CM

## 2019-04-08 DIAGNOSIS — I10 Essential (primary) hypertension: Secondary | ICD-10-CM

## 2019-04-08 LAB — POCT GLYCOSYLATED HEMOGLOBIN (HGB A1C): Hemoglobin A1C: 7.8 % — AB (ref 4.0–5.6)

## 2019-04-08 NOTE — Assessment & Plan Note (Addendum)
T2DM with A1C of 7.8% in clinic today.  Has been taking medications as directed and brought in a copy of her glucose monitoring log.    Reviewed dietary intake, glucose monitoring, yearly dilated eye exams, foot care and checking feet daily.  Lab orders written today and will return for lab work a morning that she has been fasting.  Plan:  1) Continue current medication therapy. 2) Change dietary intake to limit fruit juices and increase vegetables, salads, and follow a heart healthy diet (low salt, low fat, low carb and protein control. 3) Will schedule dilated eye exam with her ophthalmologist. 4) Continue to monitor blood glucose daily and record on log. 5) Can begin a food journal to see if there are any particular foods that are causing her blood sugars to be elevated.  Patient expresses understanding and agreement with the care plan.  Any barriers were addressed. Questions were answered to the patient's satisfaction.

## 2019-04-08 NOTE — Progress Notes (Signed)
I have reviewed this encounter including the documentation in this note and/or discussed this patient with the provider, Cyndia Skeeters FNP. I am certifying that I agree with the content of this note as supervising physician.  Nobie Putnam, DO [facility] 04/08/2019, 12:34 PM

## 2019-04-08 NOTE — Patient Instructions (Addendum)
Can call locally to obtain a COVID vaccine.  See below. When registration opens, appointments can also be scheduled by phone at [phone] (Option 2) from 8:00 am-5:00 pm, until all appointments have been filled.  Reminded to have yearly dilated eye exams, daily feet check, and following a heart healthy diet (low fat, low salt, low carb and protein control) along with daily exercise.  Eat a plant based diet for optimal health.  Advised to avoid juices, sodas, rice, pasta, potatoes and bread and to get at least 30 minutes of exercise 5x a week.  Discussed keeping a food journal to help identify elevations in blood sugar.  Other than as above, no change in treatment plans.  Patient expresses understanding and agreement with the care plan.  Any barriers were addressed. Questions were answered to the patient's satisfaction.  Continue to take your blood sugar levels and record on log.  Can continue to practice back exercises, hot showers to low back as needed, and can use over the counter LidoDerm 4% patches.  Ask the pharmacist where they are located.  Put on for 12 hours and then take off for 12 hours.  Be sure to wash hands well before/after putting on the patches.  Your lab work has been ordered and will have you return to have these drawn after you have been fasting.

## 2019-04-08 NOTE — Assessment & Plan Note (Signed)
No lab work on file to review, will have labs drawn on a morning that she is fasting.  Continue with current prescription of Lipitor. Continue current work that has daily exercises. Continue to work on incorporating more vegetables, salads, wild caught fish, and to follow a heart healthy diet (low fat, low salt, low carb and protein control) along with daily exercise.   Advised to avoid juices, sodas, rice, pasta, potatoes and bread and to get at least 30 minutes of exercise 5x a week.

## 2019-04-08 NOTE — Progress Notes (Addendum)
SUBJECTIVE: 69 y.o. female for follow up of diabetes. Diabetic Review of Systems - medication compliance: compliant all of the time, diabetic diet compliance: compliant most of the time, home glucose monitoring: is performed regularly, last eye exam approximately within the last 6 months but was undilated.  Will repeat with dilated eye exam.  Other symptoms and concerns: Elevated blood sugar readings after eating oranges these past few weeks.  Current Outpatient Medications  Medication Sig Dispense Refill  . Ascorbic Acid (VITAMIN C) 1000 MG tablet Take 1,000 mg by mouth daily.    Marland Kitchen atorvastatin (LIPITOR) 40 MG tablet Take 1 tablet (40 mg total) by mouth daily. 90 tablet 1  . Baclofen 5 MG TABS Take 2.5-5 mg by mouth at bedtime as needed. 30 tablet 2  . Biotin 1000 MCG tablet Take 4,000 mcg by mouth every other day.     . Cholecalciferol (VITAMIN D3) 50 MCG (2000 UT) capsule Take 1,000 Units by mouth daily.     . diclofenac Sodium (VOLTAREN) 1 % GEL Apply 2 g topically 3 (three) times daily as needed. 100 g 2  . insulin degludec (TRESIBA FLEXTOUCH) 100 UNIT/ML SOPN FlexTouch Pen Inject 0.26 mLs (26 Units total) into the skin daily. (Patient taking differently: Inject 22 Units into the skin daily. ) 3 pen 5  . meloxicam (MOBIC) 15 MG tablet Take 1 tablet (15 mg total) by mouth daily as needed for pain. 30 tablet 2  . metFORMIN (GLUCOPHAGE-XR) 500 MG 24 hr tablet Take 3 tablets (1,500 mg total) by mouth daily with breakfast. 270 tablet 1  . Semaglutide, 1 MG/DOSE, (OZEMPIC, 1 MG/DOSE,) 2 MG/1.5ML SOPN Inject 1 mg into the skin once a week. 6 pen 1  . spironolactone (ALDACTONE) 25 MG tablet Take 1 tablet (25 mg total) by mouth daily. 90 tablet 1  . vitamin B-12 (CYANOCOBALAMIN) 1000 MCG tablet Take 1,000 mcg by mouth daily.     . vitamin E 400 UNIT capsule Take by mouth.     No current facility-administered medications for this visit.    OBJECTIVE: Appearance: alert, well appearing, and in no  distress, oriented to person, place, and time and overweight. BP 127/80 (BP Location: Left Arm, Patient Position: Sitting, Cuff Size: Normal)   Pulse 81   Ht 5\' 2"  (1.575 m)   Wt 168 lb 6.4 oz (76.4 kg)   BMI 30.80 kg/m   Exam: fundi discs sharp, no papilledema and normal, heart sounds normal rate, regular rhythm, normal S1, S2, no murmurs, rubs, clicks or gallops, chest clear, no hepatosplenomegaly, no carotid bruits, feet: warm, good capillary refill, normal monofilament exam and normal sensory exam  Recent Labs    09/09/18 0818 12/10/18 1020 04/08/19 1030  HGBA1C 7.3* 7.5* 7.8*     ASSESSMENT: Diabetes Mellitus: stable and reasonably well controlled  PLAN: See orders for this visit as documented in the electronic medical record. Issues reviewed with her: low cholesterol diet, weight control and daily exercise discussed, foot care discussed and Podiatry visits discussed, annual eye examinations at Ophthalmology discussed, glycohemoglobin and other lab monitoring discussed and long term diabetic complications discussed.  Summer Rain, FNP-C [facility] 04/08/2019, 9:17 AM

## 2019-04-08 NOTE — Assessment & Plan Note (Signed)
The patient has been counseled on the need to get exercise a minimum of 30 minutes per day at least 5 days per week as well as adequate water intake all while measuring blood pressure a few times per week with adequate goals discussed.  Discussed specifically DASH and Mediterranean diet options, avoiding processed foods, lowering sodium intake, avoiding pork products, and eating a plant based diet for optimal health.  Patient verbalized understanding and agreement with care plan.  Questions were answered to the patient's satisfaction.

## 2019-04-08 NOTE — Assessment & Plan Note (Signed)
Low back pain that was diagnosed as Arthritis approximately 1 year ago by [facility] and was seen for this condition January 2021 in our clinic.  Has been taking the Baclofen as needed with good symptom relief, has been stretching her back, taking warm showers for muscle pain, and finds her stiffness the most when she stops moving around.  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  Discussed incorporating LidoDerm 4% patches that are over the counter, can put one on and leave on for 12 hours, followed by taking off for 12 hours.  To wash hands well before/after applying.  Also discussed can try over the counter IcyHot, BenGay, Aspercreme or BioFreeze and be sure to have good hand washing and avoid contact with eyes.

## 2019-04-12 ENCOUNTER — Telehealth: Payer: Self-pay

## 2019-04-12 ENCOUNTER — Ambulatory Visit: Payer: Self-pay | Admitting: Pharmacist

## 2019-04-12 DIAGNOSIS — E782 Mixed hyperlipidemia: Secondary | ICD-10-CM | POA: Diagnosis not present

## 2019-04-12 DIAGNOSIS — E1165 Type 2 diabetes mellitus with hyperglycemia: Secondary | ICD-10-CM | POA: Diagnosis not present

## 2019-04-12 NOTE — Chronic Care Management (AMB) (Signed)
  Chronic Care Management   Follow Up Note   04/12/2019 Name: Summer Young MRN: [name] DOB: [birth year]  Referred by: [name], FNP Reason for referral : Chronic Care Management (Patient Phone Call)   Summer Young is a 69 y.o. year old female who is a primary care patient of [name], FNP. The CCM team was consulted for assistance with chronic disease management and care coordination needs.    Receive coordination of care message from Virginia Simcox letting me know that patient has now received shipment of both Ozempic and Tresiba from [employer] patient assistance program and [facility] reviewed refill procedure with patient.  Outreach to Summer Young today as scheduled.  Was unable to reach patient via telephone today and have left HIPAA compliant voicemail asking patient to return my call.  Plan  The care management team will reach out to the patient again over the next 30 days.   [name], PharmD, [place] [employer] [phone]

## 2019-04-13 LAB — CBC WITH DIFFERENTIAL/PLATELET
Absolute Monocytes: 464 cells/uL (ref 200–950)
Basophils Absolute: 34 cells/uL (ref 0–200)
Basophils Relative: 0.4 %
Eosinophils Absolute: 95 cells/uL (ref 15–500)
Eosinophils Relative: 1.1 %
HCT: 42.4 % (ref 35.0–45.0)
Hemoglobin: 13.9 g/dL (ref 11.7–15.5)
Lymphs Abs: 2967 cells/uL (ref 850–3900)
MCH: 27.5 pg (ref 27.0–33.0)
MCHC: 32.8 g/dL (ref 32.0–36.0)
MCV: 84 fL (ref 80.0–100.0)
MPV: 9.7 fL (ref 7.5–12.5)
Monocytes Relative: 5.4 %
Neutro Abs: 5040 cells/uL (ref 1500–7800)
Neutrophils Relative %: 58.6 %
Platelets: 357 10*3/uL (ref 140–400)
RBC: 5.05 10*6/uL (ref 3.80–5.10)
RDW: 13.2 % (ref 11.0–15.0)
Total Lymphocyte: 34.5 %
WBC: 8.6 10*3/uL (ref 3.8–10.8)

## 2019-04-13 LAB — COMPREHENSIVE METABOLIC PANEL
AG Ratio: 1.7 (calc) (ref 1.0–2.5)
ALT: 17 U/L (ref 6–29)
AST: 18 U/L (ref 10–35)
Albumin: 4.5 g/dL (ref 3.6–5.1)
Alkaline phosphatase (APISO): 121 U/L (ref 37–153)
BUN: 10 mg/dL (ref 7–25)
CO2: 27 mmol/L (ref 20–32)
Calcium: 9.8 mg/dL (ref 8.6–10.4)
Chloride: 105 mmol/L (ref 98–110)
Creat: 0.82 mg/dL (ref 0.50–0.99)
Globulin: 2.7 g/dL (calc) (ref 1.9–3.7)
Glucose, Bld: 99 mg/dL (ref 65–99)
Potassium: 4.8 mmol/L (ref 3.5–5.3)
Sodium: 141 mmol/L (ref 135–146)
Total Bilirubin: 0.5 mg/dL (ref 0.2–1.2)
Total Protein: 7.2 g/dL (ref 6.1–8.1)

## 2019-04-13 LAB — MICROALBUMIN / CREATININE URINE RATIO
Creatinine, Urine: 109 mg/dL (ref 20–275)
Microalb Creat Ratio: 2 mcg/mg creat (ref <30)
Microalb, Ur: 0.2 mg/dL

## 2019-04-13 LAB — LIPID PANEL
Cholesterol: 104 mg/dL (ref <200)
HDL: 45 mg/dL — ABNORMAL LOW (ref >=50)
LDL Cholesterol (Calc): 45 mg/dL (calc)
Non-HDL Cholesterol (Calc): 59 mg/dL (calc) (ref <130)
Total CHOL/HDL Ratio: 2.3 (calc) (ref <5.0)
Triglycerides: 63 mg/dL (ref <150)

## 2019-04-13 NOTE — Progress Notes (Signed)
Labs reviewed and WNL

## 2019-04-27 ENCOUNTER — Ambulatory Visit (INDEPENDENT_AMBULATORY_CARE_PROVIDER_SITE_OTHER): Payer: PPO | Admitting: Pharmacist

## 2019-04-27 DIAGNOSIS — E1165 Type 2 diabetes mellitus with hyperglycemia: Secondary | ICD-10-CM

## 2019-04-27 DIAGNOSIS — E782 Mixed hyperlipidemia: Secondary | ICD-10-CM

## 2019-04-27 NOTE — Patient Instructions (Signed)
Thank you allowing the Chronic Care Management Team to be a part of your care! It was a pleasure speaking with you today!     CCM (Chronic Care Management) Team    Noreene Larsson RN, MSN, CCM Nurse Care Coordinator  [phone]   [name] PharmD  Clinical Pharmacist  [phone]   Eula Fried LCSW Clinical Social Worker [phone]  Visit Information  Goals Addressed            This Visit's Progress   . PharmD - Medication Management       CARE PLAN ENTRY (see longitudinal plan of care for additional care plan information)  Current Barriers:  . Chronic Disease Management support, education, and care coordination needs related to HTN, HLD, and DMII  . Financial Barriers  Pharmacist Clinical Goal(s):  Marland Kitchen Over the next 30 days, patient will work with CM Pharmacist to address needs related to medication assistance and diabetes management  Interventions: . Follow up with patient regarding back pain o Reports back pain has improved. o Reports still stiff when she gets up in the morning, but improves with movement, warm shower and use of diclofenac gel o Reports doing exercises from provider at home. . Follow up regarding blood sugar control and montioring o Reports currently taking  Tresiba 22 units once daily  Metformin ER 500 mg - 3 tablets daily with breakfast  Ozempic 1 mg once weekly o Review recent CBG results with patient (see below) o Counsel on s/s of low blood sugar and how to manage lows  Reports did feel low with CBG of 87  Reports keeps glucose tablets with her at home and at work [employer] on importance of eating regular and well-balanced meals  Reports having poor appetite recently; having trouble finding options that appeal to her, particularly while trying to limit carbohydrate portion size  Discuss dietary options, including further sources of protein  Reports that she has lost 4 lbs over past month (currently 164 lbs) o Encourage  patient to f/u sooner if has further s/s low blood sugar . Reports received 1st dose of Pfizer COVID-19 vaccine and scheduled to receive next dose on 3/25. . Will send referral to CCM Nurse Case Manager - patient interested in further information on nutrition and maintaining weight.  Patient Self Care Activities:  . Self administers medications as prescribed . Making dietary changes to help control her blood sugar: o focusing on portion sizes o avoiding dietary indiscretions . Attends all scheduled provider appointments . Calls pharmacy for medication refills . Calls provider office for new concerns or questions . Checks blood sugars as directed o Keeps log in App on phone Date Fasting Blood Sugar  3 - March 87  4 - March 90  5 - March 107  6 - March 107  7 - March 109  8 - March 129  9 - March 130  10 - March 109   Please see past updates related to this goal by clicking on the "Past Updates" button in the selected goal         Patient verbalizes understanding of instructions provided today.   Telephone follow up appointment with care management team member scheduled for: 4/7 at 4:30 pm  [name], PharmD, [place] [phone]

## 2019-04-27 NOTE — Chronic Care Management (AMB) (Signed)
Chronic Care Management   Follow Up Note   04/27/2019 Name: Summer Young MRN: [name] DOB: 05/13/1950  Referred by: Summer Bangs, FNP Reason for referral : Chronic Care Management (Patient Phone Call)   Summer Young is a 69 y.o. year old female who is a primary care patient of Summer Bangs, FNP. The CCM team was consulted for assistance with chronic disease management and care coordination needs.  Ms. Thaggard has a past medical history including but not limited to type 2 diabetes, low back pain and hypertension.   I reached out to [facility] by phone today.   Review of patient status, including review of consultants reports, relevant laboratory and other test results, and collaboration with appropriate care team members and the patient's provider was performed as part of comprehensive patient evaluation and provision of chronic care management services.     Outpatient Encounter Medications as of 04/27/2019  Medication Sig Note  . atorvastatin (LIPITOR) 40 MG tablet Take 1 tablet (40 mg total) by mouth daily.   . diclofenac Sodium (VOLTAREN) 1 % GEL Apply 2 g topically 3 (three) times daily as needed.   . insulin degludec (TRESIBA FLEXTOUCH) 100 UNIT/ML SOPN FlexTouch Pen Inject 0.26 mLs (26 Units total) into the skin daily. (Patient taking differently: Inject 22 Units into the skin daily. )   . metFORMIN (GLUCOPHAGE-XR) 500 MG 24 hr tablet Take 3 tablets (1,500 mg total) by mouth daily with breakfast.   . Semaglutide, 1 MG/DOSE, (OZEMPIC, 1 MG/DOSE,) 2 MG/1.5ML SOPN Inject 1 mg into the skin once a week. 03/15/2019: Taking on Sundays  . Ascorbic Acid (VITAMIN C) 1000 MG tablet Take 1,000 mg by mouth daily. 09/20/2018: As needed to prevent illness  . Baclofen 5 MG TABS Take 2.5-5 mg by mouth at bedtime as needed. (Patient not taking: Reported on 04/27/2019)   . Biotin 1000 MCG tablet Take 4,000 mcg by mouth every other day.  01/19/2018: Takes every other day  . Cholecalciferol  (VITAMIN D3) 50 MCG (2000 UT) capsule Take 1,000 Units by mouth daily.    . meloxicam (MOBIC) 15 MG tablet Take 1 tablet (15 mg total) by mouth daily as needed for pain. (Patient not taking: Reported on 04/27/2019)   . spironolactone (ALDACTONE) 25 MG tablet Take 1 tablet (25 mg total) by mouth daily.   . vitamin B-12 (CYANOCOBALAMIN) 1000 MCG tablet Take 1,000 mcg by mouth daily.    . vitamin E 400 UNIT capsule Take by mouth.    No facility-administered encounter medications on file as of 04/27/2019.    Goals Addressed            This Visit's Progress   . PharmD - Medication Management       CARE PLAN ENTRY (see longitudinal plan of care for additional care plan information)  Current Barriers:  . Chronic Disease Management support, education, and care coordination needs related to HTN, HLD, and DMII  . Financial Barriers  Pharmacist Clinical Goal(s):  Marland Kitchen Over the next 30 days, patient will work with CM Pharmacist to address needs related to medication assistance and diabetes management  Interventions: . Follow up with patient regarding back pain o Reports back pain has improved. o Reports still stiff when she gets up in the morning, but improves with movement, warm shower and use of diclofenac gel o Reports doing exercises from provider at home. . Follow up regarding blood sugar control and montioring o Reports currently taking  Tresiba 22 units once daily  Metformin ER 500 mg - 3 tablets daily with breakfast  Ozempic 1 mg once weekly o Review recent CBG results with patient (see below) o Counsel on s/s of low blood sugar and how to manage lows  Reports did feel low with CBG of 87  Reports keeps glucose tablets with her at home and at work [employer] on importance of eating regular and well-balanced meals  Reports having poor appetite recently; having trouble finding options that appeal to her, particularly while trying to limit carbohydrate portion size  Discuss dietary  options, including further sources of protein  Reports that she has lost 4 lbs over past month (currently 164 lbs) o Encourage patient to f/u sooner if has further s/s low blood sugar . Reports received 1st dose of Pfizer COVID-19 vaccine and scheduled to receive next dose on 3/25. . Will send referral to CCM Nurse Case Manager - patient interested in further information on nutrition and maintaining weight.  Patient Self Care Activities:  . Self administers medications as prescribed . Making dietary changes to help control her blood sugar: o focusing on portion sizes o avoiding dietary indiscretions . Attends all scheduled provider appointments . Calls pharmacy for medication refills . Calls provider office for new concerns or questions . Checks blood sugars as directed o Keeps log in App on phone Date Fasting Blood Sugar  3 - March 87  4 - March 90  5 - March 107  6 - March 107  7 - March 109  8 - March 129  9 - March 130  10 - March 109   Please see past updates related to this goal by clicking on the "Past Updates" button in the selected goal         Plan  Telephone follow up appointment with care management team member scheduled for: 4/7 at 4:30 pm  [name], PharmD, [place] [phone]

## 2019-05-03 ENCOUNTER — Encounter: Payer: Self-pay | Admitting: Family Medicine

## 2019-05-03 ENCOUNTER — Other Ambulatory Visit: Payer: Self-pay

## 2019-05-03 ENCOUNTER — Ambulatory Visit (INDEPENDENT_AMBULATORY_CARE_PROVIDER_SITE_OTHER): Payer: PPO | Admitting: Family Medicine

## 2019-05-03 DIAGNOSIS — R5383 Other fatigue: Secondary | ICD-10-CM | POA: Insufficient documentation

## 2019-05-03 NOTE — Patient Instructions (Signed)
As we discussed, likely a seasonal cold that has been aggravated with your COVID vaccine from 04/21/2019.  We have seen some people feel under the weather for up to 3 weeks after their first COVID vaccine.  Be sure to stay hydrated, continue to take your blood glucose readings, and can increase your warm beverage intake (warm tea, coffee, soups, broths) to help with you feeling chilly while a bit under the weather.  As we discussed, I have put in labs for blood work and can have that drawn tomorrow or the next few days.  If you have any worsening of symptoms, unable to tolerate foods/beverages, begin to have a fever, abdominal pain, n/v/d, to contact the office or proceed to the emergency room for evaluation.  You will receive a survey after today's visit either digitally by e-mail or paper by [employer]. Your experiences and feedback matter to [place].  Please respond so we know how we are doing as we provide care for you.  Call us with any questions/concerns/needs.  It is my goal to be available to you for your health concerns.  Thanks for choosing me to be a partner in your healthcare needs!  Harlin Rain, FNP-C Family Nurse Practitioner [facility] Phone: [phone]

## 2019-05-03 NOTE — Progress Notes (Signed)
Virtual Visit via Telephone The purpose of this virtual visit is to provide medical care while limiting exposure to the novel coronavirus (COVID19) for both patient and office staff.  Consent was obtained for phone visit:  Yes.   Answered questions that patient had about telehealth interaction:  Yes.   I discussed the limitations, risks, security and privacy concerns of performing an evaluation and management service by telephone. I also discussed with the patient that there may be a patient responsible charge related to this service. The patient expressed understanding and agreed to proceed.  Patient Location: Home Provider Location: [facility])  ---------------------------------------------------------------------- Chief Complaint  Patient presents with  . Anorexia    chills, loss appetite, intermittent nausea and  elevated glucose 125-145. The pt concern its elevated because the only thing she desire to eat is fruit, fatigue and weak. x 2 weeks     S: Reviewed CMA documentation. I have called patient and gathered additional HPI as follows:  Summer Young presents for telephonic visit, states for little over 2 weeks she has had a runny nose, intermittent nausea, some loss of appetite.  Reports had her COVID vaccine on 04/21/2019.  Has been eating some fruit, deli meat and cheeses and checking her glucose readings daily.  Reports she feels tired and sometimes she is cold but resolves with warm beverages.    Patient is currently home Denies any high risk travel to areas of current concern for COVID19. Denies any known or suspected exposure to person with or possibly with COVID19.  Denies any fevers, chills, sweats, body ache, cough, shortness of breath, sinus pain or pressure, headache, abdominal pain, diarrhea  Past Medical History:  Diagnosis Date  . Allergy   . Chronic pain of both knees   . Diabetes mellitus without complication ([place])   . Hyperlipidemia   .  Hypertension    Social History   Tobacco Use  . Smoking status: Never Smoker  . Smokeless tobacco: Never Used  Substance Use Topics  . Alcohol use: Yes    Alcohol/week: 0.0 - 1.0 standard drinks    Comment: occasional  . Drug use: Never    Current Outpatient Medications:  .  atorvastatin (LIPITOR) 40 MG tablet, Take 1 tablet (40 mg total) by mouth daily., Disp: 90 tablet, Rfl: 1 .  Baclofen 5 MG TABS, Take 2.5-5 mg by mouth at bedtime as needed., Disp: 30 tablet, Rfl: 2 .  Biotin 1000 MCG tablet, Take 4,000 mcg by mouth every other day. , Disp: , Rfl:  .  Cholecalciferol (VITAMIN D3) 50 MCG (2000 UT) capsule, Take 1,000 Units by mouth daily. , Disp: , Rfl:  .  diclofenac Sodium (VOLTAREN) 1 % GEL, Apply 2 g topically 3 (three) times daily as needed., Disp: 100 g, Rfl: 2 .  insulin degludec (TRESIBA FLEXTOUCH) 100 UNIT/ML SOPN FlexTouch Pen, Inject 0.26 mLs (26 Units total) into the skin daily. (Patient taking differently: Inject 22 Units into the skin daily. ), Disp: 3 pen, Rfl: 5 .  meloxicam (MOBIC) 15 MG tablet, Take 1 tablet (15 mg total) by mouth daily as needed for pain., Disp: 30 tablet, Rfl: 2 .  metFORMIN (GLUCOPHAGE-XR) 500 MG 24 hr tablet, Take 3 tablets (1,500 mg total) by mouth daily with breakfast., Disp: 270 tablet, Rfl: 1 .  Semaglutide, 1 MG/DOSE, (OZEMPIC, 1 MG/DOSE,) 2 MG/1.5ML SOPN, Inject 1 mg into the skin once a week., Disp: 6 pen, Rfl: 1 .  spironolactone (ALDACTONE) 25 MG tablet,  Take 1 tablet (25 mg total) by mouth daily., Disp: 90 tablet, Rfl: 1 .  vitamin B-12 (CYANOCOBALAMIN) 1000 MCG tablet, Take 1,000 mcg by mouth daily. , Disp: , Rfl:  .  Ascorbic Acid (VITAMIN C) 1000 MG tablet, Take 1,000 mg by mouth daily., Disp: , Rfl:  .  vitamin E 400 UNIT capsule, Take by mouth., Disp: , Rfl:   Depression screen [facility] 2/9 12/23/2018 09/16/2018 02/26/2018  Decreased Interest 1 0 0  Down, Depressed, Hopeless 2 0 0  PHQ - 2 Score 3 0 0  Altered sleeping 2 - -  Tired,  decreased energy 1 - -  Change in appetite 1 - -  Feeling bad or failure about yourself  1 - -  Trouble concentrating 0 - -  Moving slowly or fidgety/restless 0 - -  Suicidal thoughts 0 - -  PHQ-9 Score 8 - -  Difficult doing work/chores Somewhat difficult - -    GAD 7 : Generalized Anxiety Score 12/23/2018 06/04/2018  Nervous, Anxious, on Edge 1 0  Control/stop worrying 1 0  Worry too much - different things 1 0  Trouble relaxing 1 0  Restless 1 0  Easily annoyed or irritable 2 0  Afraid - awful might happen 2 1  Total GAD 7 Score 9 1  Anxiety Difficulty Very difficult Not difficult at all    -------------------------------------------------------------------------- O: No physical exam performed due to remote telephone encounter.   Recent Results (from the past 2160 hour(s))  POCT glycosylated hemoglobin (Hb A1C)     Status: Abnormal   Collection Time: 04/08/19 10:30 AM  Result Value Ref Range   Hemoglobin A1C 7.8 (A) 4.0 - 5.6 %  Microalbumin / creatinine urine ratio     Status: None   Collection Time: 04/12/19  8:18 AM  Result Value Ref Range   Creatinine, Urine 109 20 - 275 mg/dL   Microalb, Ur 0.2 mg/dL    Comment: Reference Range Not established    Microalb Creat Ratio 2 <30 mcg/mg creat    Comment: . The ADA defines abnormalities in albumin excretion as follows: Marland Kitchen Category         Result (mcg/mg creatinine) . Normal                    <30 Microalbuminuria         30-299  Clinical albuminuria   > OR = 300 . The ADA recommends that at least two of three specimens collected within a 3-6 month period be abnormal before considering a patient to be within a diagnostic category.   Lipid panel     Status: Abnormal   Collection Time: 04/12/19  8:18 AM  Result Value Ref Range   Cholesterol 104 <200 mg/dL   HDL 45 (L) > OR = 50 mg/dL   Triglycerides 63 <150 mg/dL   LDL Cholesterol (Calc) 45 mg/dL (calc)    Comment: Reference range: <100 . Desirable range <100  mg/dL for primary prevention;   <70 mg/dL for patients with CHD or diabetic patients  with > or = 2 CHD risk factors. Marland Kitchen LDL-C is now calculated using the Martin-Hopkins  calculation, which is a validated novel method providing  better accuracy than the Friedewald equation in the  estimation of LDL-C.  Cresenciano Genre et al. Annamaria Helling. [id number]): 2061-2068  (http://education.[facility].com/faq/FAQ164)    Total CHOL/HDL Ratio 2.3 <5.0 (calc)   Non-HDL Cholesterol (Calc) 59 <130 mg/dL (calc)    Comment: For patients with diabetes  plus 1 major ASCVD risk  factor, treating to a non-HDL-C goal of <100 mg/dL  (LDL-C of <70 mg/dL) is considered a therapeutic  option.   Comprehensive metabolic panel     Status: None   Collection Time: 04/12/19  8:18 AM  Result Value Ref Range   Glucose, Bld 99 65 - 99 mg/dL    Comment: .            Fasting reference interval .    BUN 10 7 - 25 mg/dL   Creat 0.82 0.50 - 0.99 mg/dL    Comment: For patients >82 years of age, the reference limit for Creatinine is approximately 13% higher for people identified as African-American. .    BUN/Creatinine Ratio NOT APPLICABLE 6 - 22 (calc)   Sodium 141 135 - 146 mmol/L   Potassium 4.8 3.5 - 5.3 mmol/L   Chloride 105 98 - 110 mmol/L   CO2 27 20 - 32 mmol/L   Calcium 9.8 8.6 - 10.4 mg/dL   Total Protein 7.2 6.1 - 8.1 g/dL   Albumin 4.5 3.6 - 5.1 g/dL   Globulin 2.7 1.9 - 3.7 g/dL (calc)   AG Ratio 1.7 1.0 - 2.5 (calc)   Total Bilirubin 0.5 0.2 - 1.2 mg/dL   Alkaline phosphatase (APISO) 121 37 - 153 U/L   AST 18 10 - 35 U/L   ALT 17 6 - 29 U/L  CBC with Differential/Platelet     Status: None   Collection Time: 04/12/19  8:18 AM  Result Value Ref Range   WBC 8.6 3.8 - 10.8 Thousand/uL   RBC 5.05 3.80 - 5.10 Million/uL   Hemoglobin 13.9 11.7 - 15.5 g/dL   HCT 42.4 35.0 - 45.0 %   MCV 84.0 80.0 - 100.0 fL   MCH 27.5 27.0 - 33.0 pg   MCHC 32.8 32.0 - 36.0 g/dL   RDW 13.2 11.0 - 15.0 %   Platelets 357  140 - 400 Thousand/uL   MPV 9.7 7.5 - 12.5 fL   Neutro Abs 5,040 1,500 - 7,800 cells/uL   Lymphs Abs 2,967 850 - 3,900 cells/uL   Absolute Monocytes 464 200 - 950 cells/uL   Eosinophils Absolute 95 15 - 500 cells/uL   Basophils Absolute 34 0 - 200 cells/uL   Neutrophils Relative % 58.6 %   Total Lymphocyte 34.5 %   Monocytes Relative 5.4 %   Eosinophils Relative 1.1 %   Basophils Relative 0.4 %    -------------------------------------------------------------------------- A&P:  Problem List Items Addressed This Visit      Other   Fatigue - Primary    Discussed with patient sounds like she had a seasonal cold prior to her COVID vaccine on 04/21/2019 and having mild COVID like symptoms.  Discussed symptoms related to COVID vaccine can last up to 3 weeks and the focus should be on her nutrition, rest, and ensuring she keeps an eye on her glucose readings.  Labs ordered for CBC and CMP since has been feeling fatigue and under the weather for approx 2+ weeks.  Will complete labs tomorrow and will call once results are received.  To return to clinic sooner if no change in symptoms or has any worsening.      Relevant Orders   CBC with Differential   COMPLETE METABOLIC PANEL WITH GFR      No orders of the defined types were placed in this encounter.   Follow-up: - Future labs ordered for tomorrow and will contact once results are  received - Will return to clinic if no change in symptoms or worsening of symptoms within next 5-7 days  Patient verbalizes understanding with the above medical recommendations including the limitation of remote medical advice.  Specific follow-up and call-back criteria were given for patient to follow-up or seek medical care more urgently if needed.   - Time spent in direct consultation with patient on phone: 9 minutes  Harlin Rain, [facility] 05/03/2019, 11:27 AM

## 2019-05-03 NOTE — Assessment & Plan Note (Signed)
Discussed with patient sounds like she had a seasonal cold prior to her COVID vaccine on 04/21/2019 and having mild COVID like symptoms.  Discussed symptoms related to COVID vaccine can last up to 3 weeks and the focus should be on her nutrition, rest, and ensuring she keeps an eye on her glucose readings.  Labs ordered for CBC and CMP since has been feeling fatigue and under the weather for approx 2+ weeks.  Will complete labs tomorrow and will call once results are received.  To return to clinic sooner if no change in symptoms or has any worsening.

## 2019-05-25 ENCOUNTER — Ambulatory Visit: Payer: Self-pay | Admitting: Pharmacist

## 2019-05-25 ENCOUNTER — Telehealth: Payer: Self-pay

## 2019-05-25 NOTE — Chronic Care Management (AMB) (Signed)
  Chronic Care Management   Follow Up Note   05/25/2019 Name: Summer Young MRN: [name] DOB: 10-05-1950  Referred by: [name], FNP Reason for referral : Chronic Care Management (Patient Phone Call)   Summer Young is a 69 y.o. year old female who is a primary care patient of [name], FNP. The CCM team was consulted for assistance with chronic disease management and care coordination needs.    Was unable to reach patient via telephone today and have left HIPAA compliant voicemail asking patient to return my call.   Plan  The care management team will reach out to the patient again over the next 30 days.   [name], PharmD, [place] [employer] [phone]

## 2019-05-31 ENCOUNTER — Ambulatory Visit (INDEPENDENT_AMBULATORY_CARE_PROVIDER_SITE_OTHER): Payer: PPO | Admitting: Licensed Clinical Social Worker

## 2019-05-31 DIAGNOSIS — I10 Essential (primary) hypertension: Secondary | ICD-10-CM

## 2019-05-31 DIAGNOSIS — F419 Anxiety disorder, unspecified: Secondary | ICD-10-CM

## 2019-05-31 DIAGNOSIS — E1165 Type 2 diabetes mellitus with hyperglycemia: Secondary | ICD-10-CM

## 2019-05-31 DIAGNOSIS — E782 Mixed hyperlipidemia: Secondary | ICD-10-CM

## 2019-05-31 DIAGNOSIS — F4322 Adjustment disorder with anxiety: Secondary | ICD-10-CM

## 2019-05-31 NOTE — Chronic Care Management (AMB) (Signed)
Chronic Care Management    Clinical Social Work Follow Up Note  05/31/2019 Name: Summer Young MRN: [name] DOB: [birth year]  Summer Young is a 69 y.o. year old female who is a primary care patient of [name], Lupita Raider, FNP. The CCM team was consulted for assistance with Mental Health Counseling and Resources.   Review of patient status, including review of consultants reports, other relevant assessments, and collaboration with appropriate care team members and the patient's provider was performed as part of comprehensive patient evaluation and provision of chronic care management services.    SDOH (Social Determinants of Health) assessments performed: Yes    Outpatient Encounter Medications as of 05/31/2019  Medication Sig Note  . Ascorbic Acid (VITAMIN C) 1000 MG tablet Take 1,000 mg by mouth daily. 09/20/2018: As needed to prevent illness  . atorvastatin (LIPITOR) 40 MG tablet Take 1 tablet (40 mg total) by mouth daily.   . Baclofen 5 MG TABS Take 2.5-5 mg by mouth at bedtime as needed.   . Biotin 1000 MCG tablet Take 4,000 mcg by mouth every other day.  01/19/2018: Takes every other day  . Cholecalciferol (VITAMIN D3) 50 MCG (2000 UT) capsule Take 1,000 Units by mouth daily.    . diclofenac Sodium (VOLTAREN) 1 % GEL Apply 2 g topically 3 (three) times daily as needed.   . insulin degludec (TRESIBA FLEXTOUCH) 100 UNIT/ML SOPN FlexTouch Pen Inject 0.26 mLs (26 Units total) into the skin daily. (Patient taking differently: Inject 22 Units into the skin daily. )   . meloxicam (MOBIC) 15 MG tablet Take 1 tablet (15 mg total) by mouth daily as needed for pain.   . metFORMIN (GLUCOPHAGE-XR) 500 MG 24 hr tablet Take 3 tablets (1,500 mg total) by mouth daily with breakfast.   . Semaglutide, 1 MG/DOSE, (OZEMPIC, 1 MG/DOSE,) 2 MG/1.5ML SOPN Inject 1 mg into the skin once a week. 03/15/2019: Taking on Sundays  . spironolactone (ALDACTONE) 25 MG tablet Take 1 tablet (25 mg total) by mouth daily.   .  vitamin B-12 (CYANOCOBALAMIN) 1000 MCG tablet Take 1,000 mcg by mouth daily.    . vitamin E 400 UNIT capsule Take by mouth.    No facility-administered encounter medications on file as of 05/31/2019.     Goals Addressed    . SW- "I battle anxiety" (pt-stated)       Current Barriers:  . Acute Mental Health needs related to anxiety/stress . Limited social support . Mental Health Concerns  . Family and relationship dysfunction . Social Isolation . Suicidal Ideation/Homicidal Ideation: No  Clinical Social Work Goal(s):  Marland Kitchen Over the next 120 days, patient will work with SW  bi-monthly  by telephone or in person to reduce or manage symptoms related to anxiety  . Over the next 120 days, patient will work with SW to address concerns related to gaining mental health support and develop better coping skills to manage symptoms  Interventions: . Patient interviewed and appropriate assessments performed: GAD 7 and PHQ 9. Scores have improved drastically since last outreach. Patient reports that she went on a vacation to the [place] and was able to spend time with family which increased her ability to cope with anxiety/stress/triggers more effectively. . Patient reports that her personal relationship with her spouse has improved which has alleviated stress for her as well.   Marland Kitchen LCSW provided education on relaxation techniques such as meditation, deep breathing, massage, or yoga that can activate the body's relaxation response and ease symptoms of  acute anxiety. LCSW ask that when pt is struggling with difficult emotions and stress that she start this relaxation response process.  . Discussed plans with patient for ongoing care management follow up and provided patient with direct contact information for care management team . Advised patient to contact CCM program for any urgent case management needs . Assisted patient/caregiver with obtaining information about health plan benefits . A voluntary  and extensive discussion about advanced care planning including explanation and discussion of advanced was undertaken with the patient. Explanation regarding healthcare proxy and living will was reviewed and packet with forms with explanation of how to fill them out was given.   . Encouraged patient to consider long term follow up therapy/counseling . Brief CBTprovided during session  . LCSW has sent a secure email with mental health resources during a previous outreach. However, patient denies wanting to pursue these resources at this time but is appreciative of the resource education.   Patient Self Care Activities:  . Attends all scheduled provider appointments  Patient Coping Strengths:  . Spirituality . Hopefulness  Patient Self Care Deficits:  . Lack of socialization and strong support network  Please see past updates related to this goal by clicking on the "Past Updates" button in the selected goal      Follow Up Plan: SW will follow up with patient by phone over the next quarter  Eula Fried, [place], MSW, [email or web address] Phone: [phone]

## 2019-06-06 ENCOUNTER — Telehealth: Payer: Self-pay | Admitting: General Practice

## 2019-06-06 ENCOUNTER — Ambulatory Visit: Payer: Self-pay | Admitting: General Practice

## 2019-06-06 DIAGNOSIS — E782 Mixed hyperlipidemia: Secondary | ICD-10-CM

## 2019-06-06 DIAGNOSIS — I10 Essential (primary) hypertension: Secondary | ICD-10-CM

## 2019-06-06 DIAGNOSIS — E1165 Type 2 diabetes mellitus with hyperglycemia: Secondary | ICD-10-CM

## 2019-06-06 NOTE — Patient Instructions (Signed)
Visit Information  Goals Addressed            This Visit's Progress   . RNCM: "I am doing much better now" (pt-stated)       CARE PLAN ENTRY (see longtitudinal plan of care for additional care plan information)  Current Barriers:  . Chronic Disease Management support, education, and care coordination needs related to HTN, HLD, and DMII  Clinical Goal(s) related to HTN, HLD, and DMII:  Over the next 120 days, patient will:  . Work with the care management team to address educational, disease management, and care coordination needs  . Begin or continue self health monitoring activities as directed today Measure and record cbg (blood glucose) 1 times daily and adhere to a heart healthy/ADA diet  . Call provider office for new or worsened signs and symptoms Blood pressure findings outside established parameters and New or worsened symptom related to HLD/HTN or other chronic conditions  . Call care management team with questions or concerns . Verbalize basic understanding of patient centered plan of care established today  Interventions related to HTN, HLD, and DMII:  . Evaluation of current treatment plans and patient's adherence to plan as established by provider . Assessed patient understanding of disease states.  The patient has a good understanding of chronic conditions. Says her  blood sugars are much better now and says this am her glucose was 96.  Is hopeful that her next hemoglobin A1C will be WNL.   Marland Kitchen Assessed patient's education and care coordination needs . Provided disease specific education to patient  . Collaborated with appropriate clinical care team members regarding patient needs  Patient Self Care Activities related to HTN, HLD, and DMII:  . Patient is unable to independently self-manage chronic health conditions  Initial goal documentation     . RNCM: "I am eating much better now" (pt-stated)       CARE PLAN ENTRY (see longitudinal plan of care for additional care  plan information)  Current Barriers:  Marland Kitchen Knowledge Deficits related to diet and nutrition during sick days events as evidence of elevated hemoglobin A1C, decreased appetite, and nausea after COVID vaccination.  Leodis Liverpool caregiver support.   Nurse Case Manager Clinical Goal(s):  Marland Kitchen Over the next 90 days, patient will verbalize understanding of plan for maintaining nutritional status even during sick days  . Over the next 90 days, patient will work with [facility] and pcp to address needs related to diet and nutrition for chronic disease management and wellness  . Over the next 90 days, the patient will demonstrate ongoing self health care management ability as evidenced by maintaining weight, decrease in hemoglobin A1C, stable blood glucose readings, no loss of appetite, and no nausea.   Interventions:  . Inter-disciplinary care team collaboration (see longitudinal plan of care) . Advised patient to call the pcp for changes in nutritional status and any further loss of appetite.  . Provided education to patient re: watching fruits high in glucose. The patient states that she was eating a lot of oranges when she had a loss of appetite and she feels this is why her blood sugars were so high.  . Discussed plans with patient for ongoing care management follow up and provided patient with direct contact information for care management team . Advised patient, providing education and rationale, to check cbg daily and record, calling pcp for findings outside established parameters.  . Assessed the patients current dietary habits. The patient states she is feeling much better  and is back to her normal dietary habits. Her blood glucose this am was 96.  The patient states she is hopeful her hemoglobin A1C will be WNL at the next provider visit.    Patient Self Care Activities:  . Patient verbalizes understanding of plan to maintain nutritional status even when she is having sick day episodes . Self administers  medications as prescribed . Attends all scheduled provider appointments . Calls provider office for new concerns or questions . Unable to independently manage nutritional needs as evidence of abnormal labs, loss of appetite and fatigue after receiving Covid vaccination in March.   Initial goal documentation        Ms. Mcconnell was given information about Chronic Care Management services today including:  1. CCM service includes personalized support from designated clinical staff supervised by her physician, including individualized plan of care and coordination with other care providers 2. 24/7 contact phone numbers for assistance for urgent and routine care needs. 3. Service will only be billed when office clinical staff spend 20 minutes or more in a month to coordinate care. 4. Only one practitioner may furnish and bill the service in a calendar month. 5. The patient may stop CCM services at any time (effective at the end of the month) by phone call to the office staff. 6. The patient will be responsible for cost sharing (co-pay) of up to 20% of the service fee (after annual deductible is met).  Patient agreed to services and verbal consent obtained.   Patient verbalizes understanding of instructions provided today.   The care management team will reach out to the patient again over the next 60 days. The patient would like to be called before 10 am.   Noreene Larsson RN, MSN, [facility] Mobile: [phone]

## 2019-06-06 NOTE — Chronic Care Management (AMB) (Signed)
Chronic Care Management   Initial Visit Note  06/06/2019 Name: Summer Young MRN: [id number] DOB: 03-17-1950  Referred by: [name], FNP Reason for referral : Chronic Care Management (Initial outreach: RNCM Chronic Disease management and Care coordination needs)   Summer Young is a 69 y.o. year old female who is a primary care patient of [name], FNP. The CCM team was consulted for assistance with chronic disease management and care coordination needs related to HTN, HLD and DMII  Review of patient status, including review of consultants reports, relevant laboratory and other test results, and collaboration with appropriate care team members and the patient's provider was performed as part of comprehensive patient evaluation and provision of chronic care management services.    SDOH (Social Determinants of Health) assessments performed: Yes See Care Plan activities for detailed interventions related to SDOH     Medications: Outpatient Encounter Medications as of 06/06/2019  Medication Sig Note   Ascorbic Acid (VITAMIN C) 1000 MG tablet Take 1,000 mg by mouth daily. 09/20/2018: As needed to prevent illness   atorvastatin (LIPITOR) 40 MG tablet Take 1 tablet (40 mg total) by mouth daily.    Baclofen 5 MG TABS Take 2.5-5 mg by mouth at bedtime as needed.    Biotin 1000 MCG tablet Take 4,000 mcg by mouth every other day.  01/19/2018: Takes every other day   Cholecalciferol (VITAMIN D3) 50 MCG (2000 UT) capsule Take 1,000 Units by mouth daily.     diclofenac Sodium (VOLTAREN) 1 % GEL Apply 2 g topically 3 (three) times daily as needed.    insulin degludec (TRESIBA FLEXTOUCH) 100 UNIT/ML SOPN FlexTouch Pen Inject 0.26 mLs (26 Units total) into the skin daily. (Patient taking differently: Inject 22 Units into the skin daily. )    meloxicam (MOBIC) 15 MG tablet Take 1 tablet (15 mg total) by mouth daily as needed for pain.    metFORMIN (GLUCOPHAGE-XR) 500 MG 24 hr tablet Take  3 tablets (1,500 mg total) by mouth daily with breakfast.    Semaglutide, 1 MG/DOSE, (OZEMPIC, 1 MG/DOSE,) 2 MG/1.5ML SOPN Inject 1 mg into the skin once a week. 03/15/2019: Taking on Sundays   spironolactone (ALDACTONE) 25 MG tablet Take 1 tablet (25 mg total) by mouth daily.    vitamin B-12 (CYANOCOBALAMIN) 1000 MCG tablet Take 1,000 mcg by mouth daily.     vitamin E 400 UNIT capsule Take by mouth.    No facility-administered encounter medications on file as of 06/06/2019.     Objective:  BP Readings from Last 3 Encounters:  04/08/19 127/80  02/24/19 138/86  12/10/18 139/76   Lab Results  Component Value Date   HGBA1C 7.8 (A) 04/08/2019    Goals Addressed            This Visit's Progress    RNCM: "I am doing much better now" (pt-stated)       CARE PLAN ENTRY (see longtitudinal plan of care for additional care plan information)  Current Barriers:   Chronic Disease Management support, education, and care coordination needs related to HTN, HLD, and DMII  Clinical Goal(s) related to HTN, HLD, and DMII:  Over the next 120 days, patient will:   Work with the care management team to address educational, disease management, and care coordination needs   Begin or continue self health monitoring activities as directed today Measure and record cbg (blood glucose) 1 times daily and adhere to a heart healthy/ADA diet   Call provider office for  new or worsened signs and symptoms Blood pressure findings outside established parameters and New or worsened symptom related to HLD/HTN or other chronic conditions   Call care management team with questions or concerns  Verbalize basic understanding of patient centered plan of care established today  Interventions related to HTN, HLD, and DMII:   Evaluation of current treatment plans and patient's adherence to plan as established by provider  Assessed patient understanding of disease states.  The patient has a good understanding of  chronic conditions. Says her  blood sugars are much better now and says this am her glucose was 96.  Is hopeful that her next hemoglobin A1C will be WNL.    Assessed patient's education and care coordination needs  Provided disease specific education to patient   Collaborated with appropriate clinical care team members regarding patient needs  Patient Self Care Activities related to HTN, HLD, and DMII:   Patient is unable to independently self-manage chronic health conditions  Initial goal documentation      RNCM: "I am eating much better now" (pt-stated)       CARE PLAN ENTRY (see longitudinal plan of care for additional care plan information)  Current Barriers:   Knowledge Deficits related to diet and nutrition during sick days events as evidence of elevated hemoglobin A1C, decreased appetite, and nausea after COVID vaccination.   Lacks caregiver support.   Nurse Case Manager Clinical Goal(s):   Over the next 90 days, patient will verbalize understanding of plan for maintaining nutritional status even during sick days   Over the next 90 days, patient will work with [facility] and pcp to address needs related to diet and nutrition for chronic disease management and wellness   Over the next 90 days, the patient will demonstrate ongoing self health care management ability as evidenced by maintaining weight, decrease in hemoglobin A1C, stable blood glucose readings, no loss of appetite, and no nausea.   Interventions:   Inter-disciplinary care team collaboration (see longitudinal plan of care)  Advised patient to call the pcp for changes in nutritional status and any further loss of appetite.   Provided education to patient re: watching fruits high in glucose. The patient states that she was eating a lot of oranges when she had a loss of appetite and she feels this is why her blood sugars were so high.   Discussed plans with patient for ongoing care management follow up and provided  patient with direct contact information for care management team  Advised patient, providing education and rationale, to check cbg daily and record, calling pcp for findings outside established parameters.   Assessed the patients current dietary habits. The patient states she is feeling much better and is back to her normal dietary habits. Her blood glucose this am was 96.  The patient states she is hopeful her hemoglobin A1C will be WNL at the next provider visit.    Patient Self Care Activities:   Patient verbalizes understanding of plan to maintain nutritional status even when she is having sick day episodes  Self administers medications as prescribed  Attends all scheduled provider appointments  Calls provider office for new concerns or questions  Unable to independently manage nutritional needs as evidence of abnormal labs, loss of appetite and fatigue after receiving Covid vaccination in March.   Initial goal documentation         Ms. Funderburke was given information about Chronic Care Management services today including:  1. CCM service includes personalized support from  designated clinical staff supervised by her physician, including individualized plan of care and coordination with other care providers 2. 24/7 contact phone numbers for assistance for urgent and routine care needs. 3. Service will only be billed when office clinical staff spend 20 minutes or more in a month to coordinate care. 4. Only one practitioner may furnish and bill the service in a calendar month. 5. The patient may stop CCM services at any time (effective at the end of the month) by phone call to the office staff. 6. The patient will be responsible for cost sharing (co-pay) of up to 20% of the service fee (after annual deductible is met).  Patient agreed to services and verbal consent obtained.   Plan:   The care management team will reach out to the patient again over the next 60 days. Best to call  the patient before 10 am.  Noreene Larsson RN, MSN, [place] Mobile: [phone]

## 2019-06-13 ENCOUNTER — Ambulatory Visit: Payer: Self-pay | Admitting: Pharmacist

## 2019-06-13 DIAGNOSIS — E1165 Type 2 diabetes mellitus with hyperglycemia: Secondary | ICD-10-CM

## 2019-06-13 NOTE — Patient Instructions (Signed)
Thank you allowing the Chronic Care Management Team to be a part of your care! It was a pleasure speaking with you today!     CCM (Chronic Care Management) Team    Noreene Larsson RN, MSN, CCM Nurse Care Coordinator  [phone]   [name] PharmD  Clinical Pharmacist  [phone]   Eula Fried LCSW Clinical Social Worker [phone]  Visit Information  Goals Addressed            This Visit's Progress   . PharmD - Medication Management       CARE PLAN ENTRY (see longitudinal plan of care for additional care plan information)  Current Barriers:  . Chronic Disease Management support, education, and care coordination needs related to HTN, HLD, and DMII  . Financial Barriers  Pharmacist Clinical Goal(s):  Marland Kitchen Over the next 30 days, patient will work with CM Pharmacist to address needs related to medication assistance and diabetes management  Interventions: . 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 o Reports relief with hot shower and use of heating pad o Reports has tried lower back exercises as previously provided by Dr. Parks Ranger. o Denies using baclofen due to sedation with previous use . Encourage patient to follow up with PCP if lower back pain does not improve/worsens . Follow up regarding blood sugar control and montioring o Reports currently taking  Tresiba 22 units once daily  Metformin ER 500 mg - 3 tablets daily with breakfast  Ozempic 1 mg once weekly o Review recent CBG results with patient (see below) o Denies recent s/s low blood sugar  Note patient keeps glucose tablets with her at home and at work o Reports appetite has recently improved o Encourage  patient to f/u sooner if has any s/s low blood sugar  Patient Self Care Activities:  . Self administers medications as prescribed . Making dietary changes to help control her blood sugar: o focusing on portion sizes o avoiding dietary indiscretions . Attends all scheduled provider appointments . Calls pharmacy for medication refills . Calls provider office for new concerns or questions . Checks blood sugars as directed o Keeps log in App on phone Date Fasting Blood Sugar  19 - April 109  20 - April 88  21 - April 94  22 - April 99  23 - April 106  24 - April 109  25 - April 101  26 - April 99  Average 101    Please see past updates related to this goal by clicking on the "Past Updates" button in the selected goal         Patient verbalizes understanding of instructions provided today.   The care management team will reach out to the patient again over the next 30 days.   [name], PharmD, [place] [employer] [phone]

## 2019-06-13 NOTE — Chronic Care Management (AMB) (Signed)
Chronic Care Management   Follow Up Note   06/13/2019 Name: Summer Young MRN: [id number] DOB: March 13, 1950  Referred by: [name], FNP Reason for referral : Chronic Care Management (Patient Phone Call)   Summer Young is a 69 y.o. year old female who is a primary care patient of [name], FNP. The CCM team was consulted for assistance with chronic disease management and care coordination needs.  [name] has a past medical history including but not limited to type 2 diabetes, low back pain and hypertension.   I reached out to [facility] by phone today.   Review of patient status, including review of consultants reports, relevant laboratory and other test results, and collaboration with appropriate care team members and the patient's provider was performed as part of comprehensive patient evaluation and provision of chronic care management services.     Outpatient Encounter Medications as of 06/13/2019  Medication Sig Note  . diclofenac Sodium (VOLTAREN) 1 % GEL Apply 2 g topically 3 (three) times daily as needed.   . insulin degludec (TRESIBA FLEXTOUCH) 100 UNIT/ML SOPN FlexTouch Pen Inject 0.26 mLs (26 Units total) into the skin daily. (Patient taking differently: Inject 22 Units into the skin daily. )   . meloxicam (MOBIC) 15 MG tablet Take 1 tablet (15 mg total) by mouth daily as needed for pain.   . metFORMIN (GLUCOPHAGE-XR) 500 MG 24 hr tablet Take 3 tablets (1,500 mg total) by mouth daily with breakfast.   . Semaglutide, 1 MG/DOSE, (OZEMPIC, 1 MG/DOSE,) 2 MG/1.5ML SOPN Inject 1 mg into the skin once a week. 03/15/2019: Taking on Sundays  . Ascorbic Acid (VITAMIN C) 1000 MG tablet Take 1,000 mg by mouth daily. 09/20/2018: As needed to prevent illness  . atorvastatin (LIPITOR) 40 MG tablet Take 1 tablet (40 mg total) by mouth daily.   . Baclofen 5 MG TABS Take 2.5-5 mg by mouth at bedtime as needed. (Patient not taking: Reported on 06/13/2019)   . Biotin 1000 MCG tablet  Take 4,000 mcg by mouth every other day.  01/19/2018: Takes every other day  . Cholecalciferol (VITAMIN D3) 50 MCG (2000 UT) capsule Take 1,000 Units by mouth daily.    Marland Kitchen spironolactone (ALDACTONE) 25 MG tablet Take 1 tablet (25 mg total) by mouth daily.   . vitamin B-12 (CYANOCOBALAMIN) 1000 MCG tablet Take 1,000 mcg by mouth daily.    . vitamin E 400 UNIT capsule Take by mouth.    No facility-administered encounter medications on file as of 06/13/2019.    Goals Addressed            This Visit's Progress   . PharmD - Medication Management       CARE PLAN ENTRY (see longitudinal plan of care for additional care plan information)  Current Barriers:  . Chronic Disease Management support, education, and care coordination needs related to HTN, HLD, and DMII  . Financial Barriers  Pharmacist Clinical Goal(s):  Marland Kitchen Over the next 30 days, patient will work with CM Pharmacist to address needs related to medication assistance and diabetes management  Interventions: . 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 o Reports relief with hot shower and use of heating pad  o Reports has tried lower back exercises as previously provided by Dr. Parks Ranger. o Denies using baclofen due to sedation with previous use . Encourage patient to follow up with PCP if lower back pain does not improve/worsens . Follow up regarding blood sugar control and montioring o Reports currently taking  Tresiba 22 units once daily  Metformin ER 500 mg - 3 tablets daily with breakfast  Ozempic 1 mg once weekly o Review recent CBG results with patient (see below) o Denies recent s/s low blood sugar  Note patient keeps glucose tablets  with her at home and at work o Reports appetite has recently improved o Encourage patient to f/u sooner if has any s/s low blood sugar  Patient Self Care Activities:  . Self administers medications as prescribed . Making dietary changes to help control her blood sugar: o focusing on portion sizes o avoiding dietary indiscretions . Attends all scheduled provider appointments . Calls pharmacy for medication refills . Calls provider office for new concerns or questions . Checks blood sugars as directed o Keeps log in App on phone Date Fasting Blood Sugar  19 - April 109  20 - April 88  21 - April 94  22 - April 99  23 - April 106  24 - April 109  25 - April 101  26 - April 99  Average 101    Please see past updates related to this goal by clicking on the "Past Updates" button in the selected goal         Plan  The care management team will reach out to the patient again over the next 30 days.   [name], PharmD, [place] [employer] [phone]

## 2019-06-16 ENCOUNTER — Other Ambulatory Visit: Payer: Self-pay

## 2019-06-16 ENCOUNTER — Ambulatory Visit (INDEPENDENT_AMBULATORY_CARE_PROVIDER_SITE_OTHER): Payer: PPO | Admitting: Family Medicine

## 2019-06-16 ENCOUNTER — Encounter: Payer: Self-pay | Admitting: Family Medicine

## 2019-06-16 VITALS — BP 118/71 | HR 80 | Temp 97.1°F | Ht 62.0 in | Wt 167.0 lb

## 2019-06-16 DIAGNOSIS — M545 Low back pain, unspecified: Secondary | ICD-10-CM

## 2019-06-16 DIAGNOSIS — E1165 Type 2 diabetes mellitus with hyperglycemia: Secondary | ICD-10-CM

## 2019-06-16 DIAGNOSIS — Z1231 Encounter for screening mammogram for malignant neoplasm of breast: Secondary | ICD-10-CM

## 2019-06-16 DIAGNOSIS — Z1239 Encounter for other screening for malignant neoplasm of breast: Secondary | ICD-10-CM | POA: Insufficient documentation

## 2019-06-16 DIAGNOSIS — Z13820 Encounter for screening for osteoporosis: Secondary | ICD-10-CM | POA: Diagnosis not present

## 2019-06-16 DIAGNOSIS — G8929 Other chronic pain: Secondary | ICD-10-CM | POA: Diagnosis not present

## 2019-06-16 LAB — POCT URINALYSIS DIPSTICK
Bilirubin, UA: NEGATIVE
Blood, UA: NEGATIVE
Glucose, UA: NEGATIVE
Ketones, UA: NEGATIVE
Leukocytes, UA: NEGATIVE
Nitrite, UA: NEGATIVE
Protein, UA: NEGATIVE
Spec Grav, UA: <=1.005 — AB (ref 1.010–1.025)
Urobilinogen, UA: 0.2 E.U./dL
pH, UA: 5 (ref 5.0–8.0)

## 2019-06-16 NOTE — Assessment & Plan Note (Signed)
Discussed screening for breast cancer with mammogram and early detection of breast cancer with this imaging.  Patient verbalized understanding and declined testing.

## 2019-06-16 NOTE — Assessment & Plan Note (Signed)
Pt postmenopausal w/out history of prior DEXA scan.    Plan: 1. Obtain DG bone density.

## 2019-06-16 NOTE — Progress Notes (Signed)
Subjective:    Patient ID: Summer Young, female    DOB: 1950/07/22, 69 y.o.   MRN: [name]  Summer Young is a 69 y.o. female presenting on 06/16/2019 for Back Pain (She complains that the upper, lower and mid back hurts. The back pain is worsen by prolong movement, sitting and lying x 3 week. 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 )   HPI  Ms. Rupnow presents to clinic for follow up on her back pain.  Reports she believes her pain has increased with her increase in using the broom and mop at her work.  Has stopped taking the Baclofen and finds the best relief comes from taking hot showers and letting the water run on her lower back.  Denies any increase in symptoms, any saddle anesthesia, any numbness, tingling or weakness.   Depression screen [facility] 2/9 06/06/2019 05/31/2019 12/23/2018  Decreased Interest 0 0 1  Down, Depressed, Hopeless 0 0 2  PHQ - 2 Score 0 0 3  Altered sleeping - - 2  Tired, decreased energy - - 1  Change in appetite - - 1  Feeling bad or failure about yourself  - - 1  Trouble concentrating - - 0  Moving slowly or fidgety/restless - - 0  Suicidal thoughts - - 0  PHQ-9 Score - - 8  Difficult doing work/chores - - Somewhat difficult    Social History   Tobacco Use  . Smoking status: Never Smoker  . Smokeless tobacco: Never Used  Substance Use Topics  . Alcohol use: Yes    Alcohol/week: 0.0 - 1.0 standard drinks    Comment: occasional  . Drug use: Never    Review of Systems  Constitutional: Negative.   HENT: Negative.   Eyes: Negative.   Respiratory: Negative.   Cardiovascular: Negative.   Gastrointestinal: Negative.   Endocrine: Negative.   Genitourinary: Negative.   Musculoskeletal: Positive for back pain. Negative for arthralgias, gait problem, joint swelling, myalgias, neck pain and neck stiffness.  Skin: Negative.   Allergic/Immunologic: Negative.   Neurological: Negative.    Hematological: Negative.   Psychiatric/Behavioral: Negative.    Per HPI unless specifically indicated above     Objective:    BP 118/71 (BP Location: Left Arm, Patient Position: Sitting, Cuff Size: Normal)   Pulse 80   Temp (!) 97.1 F (36.2 C) (Temporal)   Ht 5\' 2"  (1.575 m)   Wt 167 lb (75.8 kg)   BMI 30.54 kg/m   Wt Readings from Last 3 Encounters:  06/16/19 167 lb (75.8 kg)  04/08/19 168 lb 6.4 oz (76.4 kg)  02/24/19 168 lb 9.6 oz (76.5 kg)    Physical Exam Vitals reviewed.  Constitutional:      General: She is not in acute distress.    Appearance: Normal appearance. She is well-developed and well-groomed. She is obese. She is not ill-appearing or toxic-appearing.  HENT:     Head: Normocephalic.  Eyes:     General: Lids are normal. Vision grossly intact.        Right eye: No discharge.        Left eye: No discharge.     Extraocular Movements: Extraocular movements intact.     Conjunctiva/sclera: Conjunctivae normal.     Pupils: Pupils are equal, round, and reactive to light.  Cardiovascular:     Rate and Rhythm: Normal rate and regular rhythm.     Pulses: Normal pulses.  Dorsalis pedis pulses are 2+ on the right side and 2+ on the left side.       Posterior tibial pulses are 2+ on the right side and 2+ on the left side.     Heart sounds: Normal heart sounds. No murmur. No friction rub. No gallop.   Pulmonary:     Effort: Pulmonary effort is normal. No respiratory distress.     Breath sounds: Normal breath sounds.  Abdominal:     General: Abdomen is flat. Bowel sounds are normal. There is no distension.     Palpations: Abdomen is soft.  Musculoskeletal:        General: Tenderness present.     Cervical back: Normal.     Thoracic back: Normal.     Lumbar back: Tenderness and bony tenderness present. No swelling, lacerations or spasms. Decreased range of motion. Negative right straight leg raise test and negative left straight leg raise test.     Right  lower leg: No edema.     Left lower leg: No edema.  Feet:     Right foot:     Skin integrity: Skin integrity normal.     Left foot:     Skin integrity: Skin integrity normal.  Skin:    General: Skin is warm and dry.     Capillary Refill: Capillary refill takes less than 2 seconds.  Neurological:     General: No focal deficit present.     Mental Status: She is alert and oriented to person, place, and time.     Cranial Nerves: No cranial nerve deficit.     Sensory: No sensory deficit.     Motor: No weakness.     Coordination: Coordination normal.     Gait: Gait normal.  Psychiatric:        Attention and Perception: Attention and perception normal.        Mood and Affect: Mood and affect normal.        Speech: Speech normal.        Behavior: Behavior normal. Behavior is cooperative.        Thought Content: Thought content normal.        Cognition and Memory: Cognition and memory normal.        Judgment: Judgment normal.     Results for orders placed or performed in visit on 06/16/19  POCT Urinalysis Dipstick  Result Value Ref Range   Color, UA yellow    Clarity, UA clear    Glucose, UA Negative Negative   Bilirubin, UA negative    Ketones, UA negative    Spec Grav, UA <=1.005 (A) 1.010 - 1.025   Blood, UA negative    pH, UA 5.0 5.0 - 8.0   Protein, UA Negative Negative   Urobilinogen, UA 0.2 0.2 or 1.0 E.U./dL   Nitrite, UA negative    Leukocytes, UA Negative Negative   Appearance     Odor        Assessment & Plan:   Problem List Items Addressed This Visit      Endocrine   Diabetes mellitus ([place])   Relevant Orders   POCT Urinalysis Dipstick (Completed)     Other   Chronic bilateral low back pain without sciatica    Continued low back pain, diagnosed 2020 by [facility] as arthritis.  Had taken Baclofen 2.5-5mg  as needed for back pain with good relief in the past, has been stretching her back, taking warm showers for muscle pain, and finds that she  is stiff the  most once she stops moving around.  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    Had tried the lidoderm patches without much relief.  States has the best relief of her symptoms when she rests from work and takes hot showers.   Plan: 1. Continue to work on back strengthening exercises and if no improvement can look into structured physical therapy 2. Can continue to use over the counter topical creams/muscle rubs that help with pain and utilize warm showers on low back as needed 3. Will see back as needed for this      Screening for osteoporosis - Primary    Pt postmenopausal w/out history of prior DEXA scan.    Plan: 1. Obtain DG bone density.         Relevant Orders   DG Bone Density   Screening for breast cancer    Discussed screening for breast cancer with mammogram and early detection of breast cancer with this imaging.  Patient verbalized understanding and declined testing.         No orders of the defined types were placed in this encounter.   Follow up plan: Return in about 3 months (around 09/15/2019) for DM, HTN, A1C f/u.   Harlin Rain, [facility] Nurse Practitioner [facility] 06/16/2019, 9:17 AM

## 2019-06-16 NOTE — Assessment & Plan Note (Signed)
Continued low back pain, diagnosed 2020 by [facility] as arthritis.  Had taken Baclofen 2.5-5mg  as needed for back pain with good relief in the past, has been stretching her back, taking warm showers for muscle pain, and finds that she is stiff the most once she stops moving around.  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    Had tried the lidoderm patches without much relief.  States has the best relief of her symptoms when she rests from work and takes hot showers.   Plan: 1. Continue to work on back strengthening exercises and if no improvement can look into structured physical therapy 2. Can continue to use over the counter topical creams/muscle rubs that help with pain and utilize warm showers on low back as needed 3. Will see back as needed for this

## 2019-06-16 NOTE — Patient Instructions (Signed)
As we discussed, can continue to take the meloxicam and topical diclofenac gel for your lower back pain.  Can continue to take hot showers and use your heating pad as needed for your symptoms.             Low Back Pain Exercises  See other page with pictures of each exercise.  Start with 1 or 2 of these exercises that you are most comfortable with. Do not do any exercises that cause you significant worsening pain. Some of these may cause some "stretching soreness" but it should go away after you stop the exercise, and get better over time. Gradually increase up to 3-4 exercises as tolerated.  Standing hamstring stretch: Place the heel of your leg on a stool about 15 inches high. Keep your knee straight. Lean forward, bending at the hips until you feel a mild stretch in the back of your thigh. Make sure you do not roll your shoulders and bend at the waist when doing this or you will stretch your lower back instead. Hold the stretch for 15 to 30 seconds. Repeat 3 times. Repeat the same stretch on your other leg.  Cat and camel: Get down on your hands and knees. Let your stomach sag, allowing your back to curve downward. Hold this position for 5 seconds. Then arch your back and hold for 5 seconds. Do 3 sets of 10.  Quadriped Arm/Leg Raises: Get down on your hands and knees. Tighten your abdominal muscles to stiffen your spine. While keeping your abdominals tight, raise one arm and the opposite leg away from you. Hold this position for 5 seconds. Lower your arm and leg slowly and alternate sides. Do this 10 times on each side.  Pelvic tilt: Lie on your back with your knees bent and your feet flat on the floor. Tighten your abdominal muscles and push your lower back into the floor. Hold this position for 5 seconds, then relax. Do 3 sets of 10.  Partial curl: Lie on your back with your knees bent and your feet flat on the floor. Tighten your stomach muscles and flatten your back against the  floor. Tuck your chin to your chest. With your hands stretched out in front of you, curl your upper body forward until your shoulders clear the floor. Hold this position for 3 seconds. Don't hold your breath. It helps to breathe out as you lift your shoulders up. Relax. Repeat 10 times. Build to 3 sets of 10. To challenge yourself, clasp your hands behind your head and keep your elbows out to the side.  Lower trunk rotation: Lie on your back with your knees bent and your feet flat on the floor. Tighten your abdominal muscles and push your lower back into the floor. Keeping your shoulders down flat, gently rotate your legs to one side, then the other as far as you can. Repeat 10 to 20 times.  Single knee to chest stretch: Lie on your back with your legs straight out in front of you. Bring one knee up to your chest and grasp the back of your thigh. Pull your knee toward your chest, stretching your buttock muscle. Hold this position for 15 to 30 seconds and return to the starting position. Repeat 3 times on each side.  Double knee to chest: Lie on your back with your knees bent and your feet flat on the floor. Tighten your abdominal muscles and push your lower back into the floor. Pull both knees up to your  chest. Hold for 5 seconds and repeat 10 to 20 times.  Warning symptoms of possible EMERGENCY SPINAL CORD COMPRESSION (also called CAUDA EQUINA SYNDROME) - Leg or muscle weakness, difficulty lifting or heavy muscles that aren't working (not talking about pain or numbness) - Numbness in your groin or saddle region - Unable to control your bowel or bladder with incontinence IF you get any of these symptoms this potentially could be a serious spinal cord injury and recommend that you go directly to the Hospital Emergency Dept  For DEXA Scan (Bone mineral density) screening for osteoporosis  Call the [place] below anytime to schedule your own appointment now that order has been placed.  [facility] [address] [place], [place] [address] Phone: [phone]  [facility] [address] [place], [place] [address] Phone: [phone]  We will plan to see you back in 3 months for follow up on your diabetes and hypertension  You will receive a survey after today's visit either digitally by e-mail or paper by USPS mail. Your experiences and feedback matter to [place].  Please respond so we know how we are doing as we provide care for you.  Call us with any questions/concerns/needs.  It is my goal to be available to you for your health concerns.  Thanks for choosing me to be a partner in your healthcare needs!  Harlin Rain, FNP-C Family Nurse Practitioner [facility] Phone: [phone]

## 2019-06-19 ENCOUNTER — Other Ambulatory Visit: Payer: Self-pay | Admitting: Nurse Practitioner

## 2019-06-19 DIAGNOSIS — I10 Essential (primary) hypertension: Secondary | ICD-10-CM

## 2019-06-19 NOTE — Telephone Encounter (Signed)
Requested Prescriptions  Pending Prescriptions Disp Refills  . spironolactone (ALDACTONE) 25 MG tablet [Pharmacy Med Name: Spironolactone 25 MG Oral Tablet] 90 tablet 1    Sig: Take 1 tablet by mouth once daily     Cardiovascular: Diuretics - Aldosterone Antagonist Passed - 06/19/2019  9:59 AM      Passed - Cr in normal range and within 360 days    Creat  Date Value Ref Range Status  04/12/2019 0.82 0.50 - 0.99 mg/dL Final    Comment:    For patients >31 years of age, the reference limit for Creatinine is approximately 13% higher for people identified as African-American. .    Creatinine, Urine  Date Value Ref Range Status  04/12/2019 109 20 - 275 mg/dL Final         Passed - K in normal range and within 360 days    Potassium  Date Value Ref Range Status  04/12/2019 4.8 3.5 - 5.3 mmol/L Final         Passed - Na in normal range and within 360 days    Sodium  Date Value Ref Range Status  04/12/2019 141 135 - 146 mmol/L Final         Passed - Last BP in normal range    BP Readings from Last 1 Encounters:  06/16/19 118/71         Passed - Valid encounter within last 6 months    Recent Outpatient Visits          3 days ago Screening for osteoporosis   [facility], Lupita Raider, FNP   1 month ago Fatigue, unspecified type   [facility], Lupita Raider, FNP   2 months ago Type 2 diabetes mellitus with hyperglycemia, without long-term current use of insulin [facility])   [facility], Lupita Raider, FNP   3 months ago Chronic bilateral low back pain without sciatica   [place], DO   6 months ago Type 2 diabetes mellitus with hyperglycemia, without long-term current use of insulin [facility])   [facility] Merrilyn Puma, Jerrel Ivory, NP      Future Appointments            In 3 months [place], Lupita Raider, [facility], [facility]

## 2019-07-05 ENCOUNTER — Other Ambulatory Visit: Payer: Self-pay | Admitting: Family Medicine

## 2019-07-05 DIAGNOSIS — E1165 Type 2 diabetes mellitus with hyperglycemia: Secondary | ICD-10-CM

## 2019-07-05 NOTE — Telephone Encounter (Signed)
Requested medication (s) are due for refill today: Yes  Requested medication (s) are on the active medication list: Yes  Last refill:  06/11/18  Future visit scheduled: Yes  Notes to clinic:  Prescription has expired.    Requested Prescriptions  Pending Prescriptions Disp Refills   OZEMPIC, 1 MG/DOSE, 2 MG/1.5ML SOPN [Pharmacy Med Name: Ozempic (1 MG/DOSE) 2 MG/1.5ML Subcutaneous Solution Pen-injector] 4 mL 0    Sig: INJECT 1 MG SUBCUTANEOUSLY ONCE A WEEK      Endocrinology:  Diabetes - GLP-1 Receptor Agonists Passed - 07/05/2019  4:41 PM      Passed - HBA1C is between 0 and 7.9 and within 180 days    Hemoglobin A1C  Date Value Ref Range Status  04/08/2019 7.8 (A) 4.0 - 5.6 % Final   Hgb A1c MFr Bld  Date Value Ref Range Status  06/10/2018 8.1 (H) <5.7 % of total Hgb Final    Comment:    For someone without known diabetes, a hemoglobin A1c value of 6.5% or greater indicates that they may have  diabetes and this should be confirmed with a follow-up  test. . For someone with known diabetes, a value <7% indicates  that their diabetes is well controlled and a value  greater than or equal to 7% indicates suboptimal  control. A1c targets should be individualized based on  duration of diabetes, age, comorbid conditions, and  other considerations. . Currently, no consensus exists regarding use of hemoglobin A1c for diagnosis of diabetes for children. Renella Cunas - Valid encounter within last 6 months    Recent Outpatient Visits           2 weeks ago Screening for osteoporosis   [facility], Lupita Raider, FNP   2 months ago Fatigue, unspecified type   [facility], Lupita Raider, FNP   2 months ago Type 2 diabetes mellitus with hyperglycemia, without long-term current use of insulin [facility])   [facility], Lupita Raider, FNP   4 months ago Chronic bilateral low back pain without sciatica   [place], DO   6 months ago Type 2 diabetes mellitus with hyperglycemia, without long-term current use of insulin [facility])   [facility] Merrilyn Puma, Jerrel Ivory, NP       Future Appointments             In 2 months [place], Lupita Raider, [facility], [facility]

## 2019-07-07 ENCOUNTER — Other Ambulatory Visit: Payer: Self-pay

## 2019-07-27 ENCOUNTER — Ambulatory Visit (INDEPENDENT_AMBULATORY_CARE_PROVIDER_SITE_OTHER): Payer: PPO | Admitting: Pharmacist

## 2019-07-27 DIAGNOSIS — E1165 Type 2 diabetes mellitus with hyperglycemia: Secondary | ICD-10-CM

## 2019-07-27 DIAGNOSIS — Z13820 Encounter for screening for osteoporosis: Secondary | ICD-10-CM

## 2019-07-27 NOTE — Patient Instructions (Signed)
Thank you allowing the Chronic Care Management Team to be a part of your care! It was a pleasure speaking with you today!     CCM (Chronic Care Management) Team    Noreene Larsson RN, MSN, CCM Nurse Care Coordinator  [phone]   [name] PharmD  Clinical Pharmacist  [phone]   Eula Fried LCSW Clinical Social Worker [phone]  Visit Information  Goals Addressed            This Visit's Progress   . PharmD - Medication Management       CARE PLAN ENTRY (see longitudinal plan of care for additional care plan information)  Current Barriers:  . Chronic Disease Management support, education, and care coordination needs related to HTN, HLD, and DMII  . Financial Barriers  Pharmacist Clinical Goal(s):  Marland Kitchen Over the next 30 days, patient will work with CM Pharmacist to address needs related to medication assistance and diabetes management  Interventions: . Reports her back pain is improved recently. Reports pain currently controlled with use of diclofenac gel as needed. . Follow up regarding blood sugar control and montioring o Reports currently taking  Tresiba 22 units once daily  Metformin ER 500 mg - 3 tablets daily with breakfast  Ozempic 1 mg once weekly o Review recent CBG results with patient (see below)  Reports with readings in mid-80s feels shaky if does not eat breakfast right after getting up  Note patient keeps glucose tablets with her at home and at work o Reports continuing to eat regular well-balanced meals and limiting carbohydrate portion sizes o Reports has ~ 1 month supply remaining of [place] and Ozempic, needing refill through [employer] patient assistance program . Coordination of care: patient confirms that she will call to schedule DEXA Scan . Will collaborate with PCP regarding recent blood sugar readings, diabetes medication management and medication refills through patient assistance program.  Patient Self Care Activities:   . Self administers medications as prescribed . Making dietary changes to help control her blood sugar: o focusing on portion sizes o avoiding dietary indiscretions . Attends all scheduled provider appointments . Calls pharmacy for medication refills . Calls provider office for new concerns or questions . Checks blood sugars as directed o Keeps log in App on phone Date Fasting Blood Sugar  1 - June 128  2 - June 91  3 - June 107  4 - June -  5 - June 87  6 - June 85  7 - June 92  8 - June 88  9 - June 104  Average 98    Please see past updates related to this goal by clicking on the "Past Updates" button in the selected goal         Patient verbalizes understanding of instructions provided today.   The care management team will reach out to the patient again over the next 30 days.   [name], PharmD, [place] [employer] [phone]

## 2019-07-27 NOTE — Chronic Care Management (AMB) (Signed)
Chronic Care Management   Follow Up Note   07/27/2019 Name: Summer Young MRN: [id number] DOB: [birth year]  Referred by: [name], FNP Reason for referral : Chronic Care Management (Patient Phone Call)   Summer Young is a 69 y.o. year old female who is a primary care patient of [name], FNP. The CCM team was consulted for assistance with chronic disease management and care coordination needs.    I reached out to [facility] by phone today.   Review of patient status, including review of consultants reports, relevant laboratory and other test results, and collaboration with appropriate care team members and the patient's provider was performed as part of comprehensive patient evaluation and provision of chronic care management services.     Outpatient Encounter Medications as of 07/27/2019  Medication Sig Note  . insulin degludec (TRESIBA FLEXTOUCH) 100 UNIT/ML SOPN FlexTouch Pen Inject 0.26 mLs (26 Units total) into the skin daily. (Patient taking differently: Inject 22 Units into the skin daily. )   . metFORMIN (GLUCOPHAGE-XR) 500 MG 24 hr tablet Take 3 tablets (1,500 mg total) by mouth daily with breakfast.   . OZEMPIC, 1 MG/DOSE, 2 MG/1.5ML SOPN INJECT 1 MG SUBCUTANEOUSLY ONCE A WEEK   . Ascorbic Acid (VITAMIN C) 1000 MG tablet Take 1,000 mg by mouth daily as needed.  09/20/2018: As needed to prevent illness  . atorvastatin (LIPITOR) 40 MG tablet Take 1 tablet (40 mg total) by mouth daily.   . Biotin 1000 MCG tablet Take 4,000 mcg by mouth every other day.  01/19/2018: Takes every other day  . Cholecalciferol (VITAMIN D3) 50 MCG (2000 UT) capsule Take 1,000 Units by mouth daily.    . diclofenac Sodium (VOLTAREN) 1 % GEL Apply 2 g topically 3 (three) times daily as needed.   . meloxicam (MOBIC) 15 MG tablet Take 1 tablet (15 mg total) by mouth daily as needed for pain.   Marland Kitchen spironolactone (ALDACTONE) 25 MG tablet Take 1 tablet by mouth once daily   . vitamin B-12  (CYANOCOBALAMIN) 1000 MCG tablet Take 1,000 mcg by mouth daily.    . vitamin E 400 UNIT capsule Take by mouth.    No facility-administered encounter medications on file as of 07/27/2019.    Goals Addressed            This Visit's Progress   . PharmD - Medication Management       CARE PLAN ENTRY (see longitudinal plan of care for additional care plan information)  Current Barriers:  . Chronic Disease Management support, education, and care coordination needs related to HTN, HLD, and DMII  . Financial Barriers  Pharmacist Clinical Goal(s):  Marland Kitchen Over the next 30 days, patient will work with CM Pharmacist to address needs related to medication assistance and diabetes management  Interventions: . Reports her back pain is improved recently. Reports pain currently controlled with use of diclofenac gel as needed. . Follow up regarding blood sugar control and montioring o Reports currently taking  Tresiba 22 units once daily  Metformin ER 500 mg - 3 tablets daily with breakfast  Ozempic 1 mg once weekly o Review recent CBG results with patient (see below)  Reports with readings in mid-80s feels shaky if does not eat breakfast right after getting up  Note patient keeps glucose tablets with her at home and at work o Reports continuing to eat regular well-balanced meals and limiting carbohydrate portion sizes o Reports has ~ 1 month supply remaining of [place] and  Ozempic, needing refill through [employer] patient assistance program . Coordination of care: patient confirms that she will call to schedule DEXA Scan . Will collaborate with PCP regarding recent blood sugar readings, diabetes medication management and medication refills through patient assistance program.  Patient Self Care Activities:  . Self administers medications as prescribed . Making dietary changes to help control her blood sugar: o focusing on portion sizes o avoiding dietary indiscretions . Attends all scheduled  provider appointments . Calls pharmacy for medication refills . Calls provider office for new concerns or questions . Checks blood sugars as directed o Keeps log in App on phone Date Fasting Blood Sugar  1 - June 128  2 - June 91  3 - June 107  4 - June -  5 - June 87  6 - June 85  7 - June 92  8 - June 88  9 - June 104  Average 98    Please see past updates related to this goal by clicking on the "Past Updates" button in the selected goal         Plan  The care management team will reach out to the patient again over the next 30 days.   [name], PharmD, [place] [employer] [phone]

## 2019-07-28 ENCOUNTER — Ambulatory Visit: Payer: Self-pay | Admitting: Pharmacist

## 2019-07-28 DIAGNOSIS — E1165 Type 2 diabetes mellitus with hyperglycemia: Secondary | ICD-10-CM

## 2019-07-28 NOTE — Patient Instructions (Signed)
Thank you allowing the Chronic Care Management Team to be a part of your care! It was a pleasure speaking with you today!     CCM (Chronic Care Management) Team    Noreene Larsson RN, MSN, CCM Nurse Care Coordinator  [phone]   [name] PharmD  Clinical Pharmacist  [phone]   Eula Fried LCSW Clinical Social Worker [phone]  Visit Information  Goals Addressed            This Visit's Progress    PharmD - Medication Management       CARE PLAN ENTRY (see longitudinal plan of care for additional care plan information)  Current Barriers:   Chronic Disease Management support, education, and care coordination needs related to HTN, HLD, and DMII   Financial Barriers  Pharmacist Clinical Goal(s):   Over the next 30 days, patient will work with CM Pharmacist to address needs related to medication assistance and diabetes management  Interventions:  Collaborated with PCP regarding recent blood sugar readings, diabetes medication management and medication refills through patient assistance program. o PCP agreed to DECREASE of patient's Tresiba dose from 22 units daily TO 20 units daily.  o PCP will complete [employer] patient assistance program refill form for both Ozempic and [place] for patient.  Follow up with [name] regarding direction from PCP. Patient verbalizes understanding of plan via teach back to decrease Tresiba dose to 20 units daily, while continuing current doses of metformin ER and Ozempic  Patient denies further medication questions/concerns today.  Patient Self Care Activities:   Self administers medications as prescribed  Making dietary changes to help control her blood sugar: o focusing on portion sizes o avoiding dietary indiscretions  Attends all scheduled provider appointments  Calls pharmacy for medication refills  Calls provider office for new concerns or questions  Checks blood sugars as directed o Keeps log in  App on phone   Please see past updates related to this goal by clicking on the "Past Updates" button in the selected goal         Patient verbalizes understanding of instructions provided today.   Telephone follow up appointment with care management team member scheduled for: 7/5 at 4:30 pm  [name], PharmD, [place] [phone]

## 2019-07-28 NOTE — Chronic Care Management (AMB) (Addendum)
Chronic Care Management   Follow Up Note   07/28/2019 Name: Summer Young MRN: [id number] DOB: Feb 22, 1950  Referred by: [name], FNP Reason for referral : Chronic Care Management (Patient Phone Call)   Summer Young is a 69 y.o. year old female who is a primary care patient of [name], FNP. The CCM team was consulted for assistance with chronic disease management and care coordination needs.    I reached out to [facility] by phone today.   Review of patient status, including review of consultants reports, relevant laboratory and other test results, and collaboration with appropriate care team members and the patient's provider was performed as part of comprehensive patient evaluation and provision of chronic care management services.    Outpatient Encounter Medications as of 07/28/2019  Medication Sig Note  . Ascorbic Acid (VITAMIN C) 1000 MG tablet Take 1,000 mg by mouth daily as needed.  09/20/2018: As needed to prevent illness  . atorvastatin (LIPITOR) 40 MG tablet Take 1 tablet (40 mg total) by mouth daily.   . Biotin 1000 MCG tablet Take 4,000 mcg by mouth every other day.  01/19/2018: Takes every other day  . Cholecalciferol (VITAMIN D3) 50 MCG (2000 UT) capsule Take 1,000 Units by mouth daily.    . diclofenac Sodium (VOLTAREN) 1 % GEL Apply 2 g topically 3 (three) times daily as needed.   . insulin degludec (TRESIBA FLEXTOUCH) 100 UNIT/ML SOPN FlexTouch Pen Inject 0.26 mLs (26 Units total) into the skin daily. (Patient taking differently: Inject 20 Units into the skin daily. )   . meloxicam (MOBIC) 15 MG tablet Take 1 tablet (15 mg total) by mouth daily as needed for pain.   . metFORMIN (GLUCOPHAGE-XR) 500 MG 24 hr tablet Take 3 tablets (1,500 mg total) by mouth daily with breakfast.   . OZEMPIC, 1 MG/DOSE, 2 MG/1.5ML SOPN INJECT 1 MG SUBCUTANEOUSLY ONCE A WEEK   . spironolactone (ALDACTONE) 25 MG tablet Take 1 tablet by mouth once daily   . vitamin B-12  (CYANOCOBALAMIN) 1000 MCG tablet Take 1,000 mcg by mouth daily.    . vitamin E 400 UNIT capsule Take by mouth.    No facility-administered encounter medications on file as of 07/28/2019.    Goals Addressed            This Visit's Progress   . PharmD - Medication Management       CARE PLAN ENTRY (see longitudinal plan of care for additional care plan information)  Current Barriers:  . Chronic Disease Management support, education, and care coordination needs related to HTN, HLD, and DMII  . Financial Barriers  Pharmacist Clinical Goal(s):  Marland Kitchen Over the next 30 days, patient will work with CM Pharmacist to address needs related to medication assistance and diabetes management  Interventions: . Collaborated with PCP regarding recent blood sugar readings, diabetes medication management and medication refills through patient assistance program. o PCP agrees with DECREASE of patient's Tresiba dose from 22 units daily TO 20 units daily.  o PCP will complete Novo Nordisk patient assistance program refill form for both Ozempic and [place] for patient. . Follow up with [name] regarding direction from PCP. Patient verbalizes understanding of plan via teach back to decrease Tresiba dose to 20 units daily, while continuing current doses of metformin ER and Ozempic . Patient denies further medication questions/concerns today.  Patient Self Care Activities:  . Self administers medications as prescribed . Making dietary changes to help control her blood sugar:  o focusing on portion sizes o avoiding dietary indiscretions . Attends all scheduled provider appointments . Calls pharmacy for medication refills . Calls provider office for new concerns or questions . Checks blood sugars as directed o Keeps log in App on phone   Please see past updates related to this goal by clicking on the "Past Updates" button in the selected goal         Plan  Telephone follow up appointment with care  management team member scheduled for: 7/5 at 4:30 pm  [name], PharmD, [place] [phone]

## 2019-08-05 ENCOUNTER — Other Ambulatory Visit: Payer: Self-pay | Admitting: Nurse Practitioner

## 2019-08-05 DIAGNOSIS — Z794 Long term (current) use of insulin: Secondary | ICD-10-CM

## 2019-08-05 NOTE — Telephone Encounter (Signed)
Requested medication (s) are due for refill today: yes  Requested medication (s) are on the active medication list:yes  Last refill: 12/10/18  #270  1 refill  Future visit scheduled: yes  Notes to clinic: overdue eGFR last checked 06/10/18    Requested Prescriptions  Pending Prescriptions Disp Refills   metFORMIN (GLUCOPHAGE-XR) 500 MG 24 hr tablet [Pharmacy Med Name: metFORMIN HCl ER 500 MG Oral Tablet Extended Release 24 Hour] 270 tablet 0    Sig: TAKE 3 TABLETS BY [place]      Endocrinology:  Diabetes - Biguanides Failed - 08/05/2019  8:54 AM      Failed - eGFR in normal range and within 360 days    GFR, Est African American  Date Value Ref Range Status  06/10/2018 91 > OR = 60 mL/min/1.16m Final   GFR, Est Non African American  Date Value Ref Range Status  06/10/2018 79 > OR = 60 mL/min/1.73mFinal          Passed - Cr in normal range and within 360 days    Creat  Date Value Ref Range Status  04/12/2019 0.82 0.50 - 0.99 mg/dL Final    Comment:    For patients >4974ears of age, the reference limit for Creatinine is approximately 13% higher for people identified as African-American. .    Creatinine, Urine  Date Value Ref Range Status  04/12/2019 109 20 - 275 mg/dL Final          Passed - HBA1C is between 0 and 7.9 and within 180 days    Hemoglobin A1C  Date Value Ref Range Status  04/08/2019 7.8 (A) 4.0 - 5.6 % Final   Hgb A1c MFr Bld  Date Value Ref Range Status  06/10/2018 8.1 (H) <5.7 % of total Hgb Final    Comment:    For someone without known diabetes, a hemoglobin A1c value of 6.5% or greater indicates that they may have  diabetes and this should be confirmed with a follow-up  test. . For someone with known diabetes, a value <7% indicates  that their diabetes is well controlled and a value  greater than or equal to 7% indicates suboptimal  control. A1c targets should be individualized based on  duration of diabetes,  age, comorbid conditions, and  other considerations. . Currently, no consensus exists regarding use of hemoglobin A1c for diagnosis of diabetes for children. . Summer Young Valid encounter within last 6 months    Recent Outpatient Visits           1 month ago Screening for osteoporosis   [name]   3 months ago Fatigue, unspecified type   [name]   3 months ago Type 2 diabetes mellitus with hyperglycemia, without long-term current use of insulin [facility]  [name]   5 months ago Chronic bilateral low back pain without sciatica   [name]   7 months ago Type 2 diabetes mellitus with hyperglycemia, without long-term current use of insulin ([facility]  [facility]       Future Appointments             [name], [facility]

## 2019-08-11 ENCOUNTER — Other Ambulatory Visit: Payer: Self-pay

## 2019-08-11 ENCOUNTER — Telehealth: Payer: Self-pay

## 2019-08-11 ENCOUNTER — Ambulatory Visit (INDEPENDENT_AMBULATORY_CARE_PROVIDER_SITE_OTHER): Payer: PPO | Admitting: Family Medicine

## 2019-08-11 ENCOUNTER — Encounter: Payer: Self-pay | Admitting: Family Medicine

## 2019-08-11 VITALS — BP 129/62 | HR 81 | Temp 97.8°F | Ht 62.0 in | Wt 165.0 lb

## 2019-08-11 DIAGNOSIS — R1013 Epigastric pain: Secondary | ICD-10-CM

## 2019-08-11 DIAGNOSIS — K5900 Constipation, unspecified: Secondary | ICD-10-CM

## 2019-08-11 DIAGNOSIS — E1165 Type 2 diabetes mellitus with hyperglycemia: Secondary | ICD-10-CM | POA: Diagnosis not present

## 2019-08-11 MED ORDER — DICYCLOMINE HCL 10 MG PO CAPS: 10.0000 mg | ORAL_CAPSULE | Freq: Three times a day (TID) | ORAL | 0 refills | Status: DC

## 2019-08-11 MED ORDER — BLOOD GLUCOSE METER KIT: PACK | 0 refills | Status: AC

## 2019-08-11 MED ORDER — POLYETHYLENE GLYCOL 3350 17 GM/SCOOP PO POWD: 17.0000 g | Freq: Two times a day (BID) | ORAL | 1 refills | Status: DC | PRN

## 2019-08-11 NOTE — Progress Notes (Signed)
Subjective:    Patient ID: Summer Young, female    DOB: Sep 26, 1950, 69 y.o.   MRN: [id number]  Summer Young is a 69 y.o. female presenting on 08/11/2019 for Irritable Bowel Syndrome (abdominal pain with irregular bowel movement. Intermittent constipation and diarrhea x 2 mths She been taking Metamucil daily, but only 1/4tsp. Diarrhea, vomitting and abdominal pain   x 4 days. She deinies fever, headache. )   HPI  Summer Young presents to clinic for evaluation of abdominal pain with alternating constipation/diarrhea x 2 months with worsening of symptoms in the past 4 days having 1 episode of vomiting.  Reports the abdominal pain varies from epigastric to RLQ & LLQ when having diarrhea/constipation.  Has a history of diverticulitis, reports that was many years ago.  Reports pain as a cramping sensation.  Has been taking varying doses of metamucil, says when taking increased amounts she has diarrhea and decreased amounts leading to constipation.  Depression screen [facility] 2/9 06/06/2019 05/31/2019 12/23/2018  Decreased Interest 0 0 1  Down, Depressed, Hopeless 0 0 2  PHQ - 2 Score 0 0 3  Altered sleeping - - 2  Tired, decreased energy - - 1  Change in appetite - - 1  Feeling bad or failure about yourself  - - 1  Trouble concentrating - - 0  Moving slowly or fidgety/restless - - 0  Suicidal thoughts - - 0  PHQ-9 Score - - 8  Difficult doing work/chores - - Somewhat difficult    Social History   Tobacco Use  . Smoking status: Never Smoker  . Smokeless tobacco: Never Used  Vaping Use  . Vaping Use: Never used  Substance Use Topics  . Alcohol use: Yes    Alcohol/week: 0.0 - 1.0 standard drinks    Comment: occasional  . Drug use: Never    Review of Systems  Constitutional: Negative.   HENT: Negative.   Eyes: Negative.   Respiratory: Negative.   Cardiovascular: Negative.   Gastrointestinal: Positive for abdominal pain, constipation, diarrhea and vomiting. Negative for abdominal  distention, anal bleeding, blood in stool, nausea and rectal pain.  Endocrine: Negative.   Genitourinary: Negative.   Musculoskeletal: Negative.   Skin: Negative.   Allergic/Immunologic: Negative.   Neurological: Negative.   Hematological: Negative.   Psychiatric/Behavioral: Negative.    Per HPI unless specifically indicated above     Objective:    BP 129/62 (BP Location: Left Arm, Patient Position: Sitting, Cuff Size: Normal)   Pulse 81   Temp 97.8 F (36.6 C) (Temporal)   Ht '5\' 2"'$  (1.575 m)   Wt 165 lb (74.8 kg)   BMI 30.18 kg/m   Wt Readings from Last 3 Encounters:  08/11/19 165 lb (74.8 kg)  06/16/19 167 lb (75.8 kg)  04/08/19 168 lb 6.4 oz (76.4 kg)    Physical Exam Vitals reviewed.  Constitutional:      General: She is not in acute distress.    Appearance: Normal appearance. She is well-developed and well-groomed. She is not ill-appearing or toxic-appearing.  HENT:     Head: Normocephalic and atraumatic.     Nose:     Comments: Summer Young is in place, covering mouth and nose. Eyes:     General: Lids are normal. Vision grossly intact.        Right eye: No discharge.        Left eye: No discharge.     Extraocular Movements: Extraocular movements intact.     Conjunctiva/sclera: Conjunctivae normal.  Pupils: Pupils are equal, round, and reactive to light.  Cardiovascular:     Rate and Rhythm: Normal rate and regular rhythm.     Pulses: Normal pulses.          Dorsalis pedis pulses are 2+ on the right side and 2+ on the left side.     Heart sounds: Normal heart sounds. No murmur heard.  No friction rub. No gallop.   Pulmonary:     Effort: Pulmonary effort is normal. No respiratory distress.     Breath sounds: Normal breath sounds.  Abdominal:     General: Abdomen is flat. Bowel sounds are normal. There is no distension.     Palpations: Abdomen is soft. There is no hepatomegaly, splenomegaly, mass or pulsatile mass.     Tenderness: There is no abdominal  tenderness. There is no guarding or rebound. Negative signs include Murphy's sign.     Hernia: No hernia is present.     Comments: Negative abdominal exam  Musculoskeletal:     Right lower leg: No edema.     Left lower leg: No edema.  Skin:    General: Skin is warm and dry.     Capillary Refill: Capillary refill takes less than 2 seconds.  Neurological:     General: No focal deficit present.     Mental Status: She is alert and oriented to person, place, and time.  Psychiatric:        Attention and Perception: Attention and perception normal.        Mood and Affect: Mood and affect normal.        Speech: Speech normal.        Behavior: Behavior normal. Behavior is cooperative.        Thought Content: Thought content normal.        Cognition and Memory: Cognition and memory normal.        Judgment: Judgment normal.    Results for orders placed or performed in visit on 06/16/19  POCT Urinalysis Dipstick  Result Value Ref Range   Color, UA yellow    Clarity, UA clear    Glucose, UA Negative Negative   Bilirubin, UA negative    Ketones, UA negative    Spec Grav, UA <=1.005 (A) 1.010 - 1.025   Blood, UA negative    pH, UA 5.0 5.0 - 8.0   Protein, UA Negative Negative   Urobilinogen, UA 0.2 0.2 or 1.0 E.U./dL   Nitrite, UA negative    Leukocytes, UA Negative Negative   Appearance     Odor        Assessment & Plan:   Problem List Items Addressed This Visit      Endocrine   Diabetes mellitus ([place])   Relevant Medications   blood glucose meter kit and supplies     Other   Abdominal pain - Primary    Abdominal pain with alternating diarrhea and constipation with abdominal cramps x 2 months with worsening of symptoms over the past 4 days.  Had one episode of vomiting in the past 4 days.  Has history of diverticulitis years ago.  Has been taking metamucil in different amounts, sometimes leading to diarrhea and sometimes constipation.  Has some epigastric pain intermittently.   Abdominal exam negative, no concerns for acute abdomen.  Plan: 1. Stop metamucil and begin mirilax 1-2 cap fulls daily mixed with a warm beverage 2. H Pylori breath test ordered and to be completed in the next few days 3.  Can take maalox, according to packaging directions, to help with epigastric abdominal pain 4. Rx sent in for Bentyl, to take as directed for abdominal cramping 5. Follow up with any worsening of symptoms or if symptoms do not fail to improve      Relevant Medications   dicyclomine (BENTYL) 10 MG capsule   Other Relevant Orders   H. pylori breath test    Other Visit Diagnoses    Constipation, unspecified constipation type       Relevant Medications   polyethylene glycol powder (GLYCOLAX/MIRALAX) 17 GM/SCOOP powder   dicyclomine (BENTYL) 10 MG capsule      Meds ordered this encounter  Medications  . polyethylene glycol powder (GLYCOLAX/MIRALAX) 17 GM/SCOOP powder    Sig: Take 17 g by mouth 2 (two) times daily as needed.    Dispense:  3350 g    Refill:  1  . dicyclomine (BENTYL) 10 MG capsule    Sig: Take 1 capsule (10 mg total) by mouth 4 (four) times daily -  before meals and at bedtime for 10 days.    Dispense:  40 capsule    Refill:  0  . blood glucose meter kit and supplies    Sig: Dispense based on patient and insurance preference. Use up to four times daily as directed. (FOR ICD-10 E10.9, E11.9).    Dispense:  1 each    Refill:  0    Order Specific Question:   Number of strips    Answer:   300    Order Specific Question:   Number of lancets    Answer:   300      Follow up plan: No follow-ups on file.   Harlin Rain, [place] [facility] Nurse Practitioner [facility] 08/11/2019, 4:41 PM

## 2019-08-11 NOTE — Assessment & Plan Note (Signed)
Abdominal pain with alternating diarrhea and constipation with abdominal cramps x 2 months with worsening of symptoms over the past 4 days.  Had one episode of vomiting in the past 4 days.  Has history of diverticulitis years ago.  Has been taking metamucil in different amounts, sometimes leading to diarrhea and sometimes constipation.  Has some epigastric pain intermittently.  Abdominal exam negative, no concerns for acute abdomen.  Plan: 1. Stop metamucil and begin mirilax 1-2 cap fulls daily mixed with a warm beverage 2. H Pylori breath test ordered and to be completed in the next few days 3. Can take maalox, according to packaging directions, to help with epigastric abdominal pain 4. Rx sent in for Bentyl, to take as directed for abdominal cramping 5. Follow up with any worsening of symptoms or if symptoms do not fail to improve

## 2019-08-11 NOTE — Patient Instructions (Signed)
As we discussed, appears your Metamucil may be aggravating you stomach and causing you to fluctuate between constipation and diarrhea.  I have sent in a prescription for mirilax to use 1-2 cap fulls daily mixed with a warm beverage.  Once you have stable consistent bowel movements, continue to use this daily so it keeps you regular.  I have put in an order for an H. Pylori breath test.  Our lab does these Monday-Friday from 8am-12pm.  Come back in the next few days to have this done.  Can take over the counter Maalox to help with your upper abdominal discomfort.  I have sent in a prescription for Bentyl to take 1 tablet before each meal and 1 at bedtime.  Be sure to take your first dose at home so you know how you will react to this medication.  We will plan to see you back as needed, if your symptoms worsen or fail to improve  You will receive a survey after today's visit either digitally by e-mail or paper by [employer]. Your experiences and feedback matter to [place].  Please respond so we know how we are doing as we provide care for you.  Call us with any questions/concerns/needs.  It is my goal to be available to you for your health concerns.  Thanks for choosing me to be a partner in your healthcare needs!  Harlin Rain, FNP-C Family Nurse Practitioner [facility] Phone: [phone]

## 2019-08-11 NOTE — Telephone Encounter (Signed)
Copied from [place] [id number]. Topic: General - Other >> Aug 11, 2019  9:04 AM Oneta Rack wrote: Reason for CRM: Patient scheduled my chart video appt today with PCP to address diverticulitis / diarrhea for the past 2 months. Patient would prefer in office please follow up and leave a detail message due to patient working.   Patient also requesting a prescription for a glucose monitor, patient states the 3 monitors she has are displaying error messages   I spoke with the patient and she denied coughing, SOB, fever, chills or any other COVID symptoms besides diarrhea. Appointment changed to in office visit today.

## 2019-08-15 ENCOUNTER — Ambulatory Visit: Payer: Self-pay | Admitting: Pharmacist

## 2019-08-15 ENCOUNTER — Telehealth: Payer: Self-pay | Admitting: General Practice

## 2019-08-15 ENCOUNTER — Ambulatory Visit: Payer: Self-pay | Admitting: General Practice

## 2019-08-15 DIAGNOSIS — K5900 Constipation, unspecified: Secondary | ICD-10-CM

## 2019-08-15 DIAGNOSIS — E782 Mixed hyperlipidemia: Secondary | ICD-10-CM | POA: Diagnosis not present

## 2019-08-15 DIAGNOSIS — E1165 Type 2 diabetes mellitus with hyperglycemia: Secondary | ICD-10-CM

## 2019-08-15 DIAGNOSIS — R1013 Epigastric pain: Secondary | ICD-10-CM

## 2019-08-15 DIAGNOSIS — Z13820 Encounter for screening for osteoporosis: Secondary | ICD-10-CM

## 2019-08-15 DIAGNOSIS — I10 Essential (primary) hypertension: Secondary | ICD-10-CM

## 2019-08-15 DIAGNOSIS — F419 Anxiety disorder, unspecified: Secondary | ICD-10-CM

## 2019-08-15 NOTE — Patient Instructions (Signed)
Visit Information  Goals Addressed              This Visit's Progress   .  RNCM: "I am doing much better now" (pt-stated)        CARE PLAN ENTRY (see longtitudinal plan of care for additional care plan information)  Current Barriers:  . Chronic Disease Management support, education, and care coordination needs related to HTN, HLD, and DMII  Clinical Goal(s) related to HTN, HLD, and DMII:  Over the next 120 days, patient will:  . Work with the care management team to address educational, disease management, and care coordination needs  . Begin or continue self health monitoring activities as directed today Measure and record cbg (blood glucose) 1 times daily and adhere to a heart healthy/ADA diet  . Call provider office for new or worsened signs and symptoms Blood pressure findings outside established parameters and New or worsened symptom related to HLD/HTN or other chronic conditions  . Call care management team with questions or concerns . Verbalize basic understanding of patient centered plan of care established today  Interventions related to HTN, HLD, and DMII:  . Evaluation of current treatment plans and patient's adherence to plan as established by provider . Assessed patient understanding of disease states.  The patient has a good understanding of chronic conditions. Says her  blood sugars are much better now and says this am her glucose was 100. Range is usually 90 to 120.  Is hopeful that her next hemoglobin A1C will be WNL.  Recently had to get another glucose meter, she has the one touch now.  . Assessed patient's education and care coordination needs.  The patient saw the pcp on 08/11/2019 for some issues with abdominal discomfort and also DM meter. The patient was started on Bentyl and this is helping. . Provided disease specific education to patient.  Education on maintaining weight and monitoring nutritional intake. The patient realizes that she has to stay away from spicy  foods. Is feeling much better this week.   Nash Dimmer with appropriate clinical care team members regarding patient needs.  The patient is currently working with CCM team pharmacist and LCSW.   Patient Self Care Activities related to HTN, HLD, and DMII:  . Patient is unable to independently self-manage chronic health conditions  Please see past updates related to this goal by clicking on the "Past Updates" button in the selected goal      .  RNCM: "I am eating much better now" (pt-stated)        [place] (see longitudinal plan of care for additional care plan information)  Current Barriers:  Marland Kitchen Knowledge Deficits related to diet and nutrition during sick days events as evidence of elevated hemoglobin A1C, decreased appetite, and nausea after COVID vaccination.  Leodis [place] caregiver support.   Nurse Case Manager Clinical Goal(s):  Marland Kitchen Over the next 90 days, patient will verbalize understanding of plan for maintaining nutritional status even during sick days  . Over the next 90 days, patient will work with [facility] and pcp to address needs related to diet and nutrition for chronic disease management and wellness  . Over the next 90 days, the patient will demonstrate ongoing self health care management ability as evidenced by maintaining weight, decrease in hemoglobin A1C, stable blood glucose readings, no loss of appetite, and no nausea.   Interventions:  . Inter-disciplinary care team collaboration (see longitudinal plan of care) . Advised patient to call the pcp for  changes in nutritional status and any further loss of appetite.  . Provided education to patient re: watching fruits high in glucose. The patient states that she was eating a lot of oranges when she had a loss of appetite and she feels this is why her blood sugars were so high. The patient has been working on this and her blood sugars have dropped to 90-120's. . Discussed plans with patient for ongoing care management follow up  and provided patient with direct contact information for care management team . Advised patient, providing education and rationale, to check cbg daily and record, calling pcp for findings outside established parameters. The patient has a new meter and is taking her blood sugars daily. Praised for accomplishments.  . Assessed the patients current dietary habits. The patient states she is feeling much better and is back to her normal dietary habits. Her blood glucose this am was 100.  The patient states she is hopeful her hemoglobin A1C will be WNL at the next provider visit.    Patient Self Care Activities:  . Patient verbalizes understanding of plan to maintain nutritional status even when she is having sick day episodes . Self administers medications as prescribed . Attends all scheduled provider appointments . Calls provider office for new concerns or questions . Unable to independently manage nutritional needs as evidence of abnormal labs, loss of appetite and fatigue after receiving Covid vaccination in March.   Please see past updates related to this goal by clicking on the "Past Updates" button in the selected goal         Patient verbalizes understanding of instructions provided today.   The care management team will reach out to the patient again over the next 60 to 90 days.   Noreene Larsson RN, MSN, [place] Mobile: [phone]

## 2019-08-15 NOTE — Chronic Care Management (AMB) (Signed)
Chronic Care Management   Follow Up Note   08/15/2019 Name: Summer Young MRN: [id number] DOB: 29-Oct-1950  Referred by: [name], FNP Reason for referral : Chronic Care Management (RNCM Initial: Chronic Disease Management and Care Coordination Needs)   Summer Young is a 69 y.o. year old female who is a primary care patient of [name], FNP. The CCM team was consulted for assistance with chronic disease management and care coordination needs.    Review of patient status, including review of consultants reports, relevant laboratory and other test results, and collaboration with appropriate care team members and the patient's provider was performed as part of comprehensive patient evaluation and provision of chronic care management services.    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  09/20/2018: As needed to prevent illness   atorvastatin (LIPITOR) 40 MG tablet Take 1 tablet (40 mg total) by mouth daily.    Biotin 1000 MCG tablet Take 4,000 mcg by mouth every other day.  01/19/2018: Takes every other day   blood glucose meter kit and supplies Dispense based on patient and insurance preference. Use up to four times daily as directed. (FOR ICD-10 E10.9, E11.9).    Cholecalciferol (VITAMIN D3) 50 MCG (2000 UT) capsule Take 1,000 Units by mouth daily.     diclofenac Sodium (VOLTAREN) 1 % GEL Apply 2 g topically 3 (three) times daily as needed.    dicyclomine  (BENTYL) 10 MG capsule Take 1 capsule (10 mg total) by mouth 4 (four) times daily -  before meals and at bedtime for 10 days.    insulin degludec (TRESIBA FLEXTOUCH) 100 UNIT/ML SOPN FlexTouch Pen Inject 0.26 mLs (26 Units total) into the skin daily. (Patient taking differently: Inject 20 Units into the skin daily. )    meloxicam (MOBIC) 15 MG tablet Take 1 tablet (15 mg total) by mouth daily as needed for pain.    metFORMIN (GLUCOPHAGE-XR) 500 MG 24 hr tablet TAKE 3 TABLETS BY MOUTH ONCE DAILY WITH BREAKFAST    OZEMPIC, 1 MG/DOSE, 2 MG/1.5ML SOPN INJECT 1 MG SUBCUTANEOUSLY ONCE A WEEK    polyethylene glycol powder (GLYCOLAX/MIRALAX) 17 GM/SCOOP powder Take 17 g by mouth 2 (two) times daily as needed.    spironolactone (ALDACTONE) 25 MG tablet Take 1 tablet by mouth once daily    vitamin B-12 (CYANOCOBALAMIN) 1000 MCG tablet Take 1,000 mcg by mouth daily.     vitamin E 400 UNIT capsule Take by mouth.    No facility-administered encounter medications on file as of 08/15/2019.     Objective:  Lab Results  Component Value Date   HGBA1C 7.8 (A) 04/08/2019    Goals Addressed              This Visit's Progress     RNCM: "I am doing much better now" (pt-stated)        CARE PLAN ENTRY (see longtitudinal plan of care for additional care plan information)  Current Barriers:   Chronic  Disease Management support, education, and care coordination needs related to HTN, HLD, and DMII  Clinical Goal(s) related to HTN, HLD, and DMII:  Over the next 120 days, patient will:   Work with the care management team to address educational, disease management, and care coordination needs   Begin or continue self health monitoring activities as directed today Measure and record cbg (blood glucose) 1 times daily and adhere to a heart healthy/ADA diet   Call provider office for new or worsened signs and symptoms Blood pressure findings outside established parameters and New or worsened symptom  related to HLD/HTN or other chronic conditions   Call care management team with questions or concerns  Verbalize basic understanding of patient centered plan of care established today  Interventions related to HTN, HLD, and DMII:   Evaluation of current treatment plans and patient's adherence to plan as established by provider  Assessed patient understanding of disease states.  The patient has a good understanding of chronic conditions. Says her  blood sugars are much better now and says this am her glucose was 100. Range is usually 90 to 120.  Is hopeful that her next hemoglobin A1C will be WNL.  Recently had to get another glucose meter, she has the one touch now.   Assessed patient's education and care coordination needs.  The patient saw the pcp on 08/11/2019 for some issues with abdominal discomfort and also DM meter. The patient was started on Bentyl and this is helping.  Provided disease specific education to patient.  Education on maintaining weight and monitoring nutritional intake. The patient realizes that she has to stay away from spicy foods. Is feeling much better this week.    Collaborated with appropriate clinical care team members regarding patient needs.  The patient is currently working with CCM team pharmacist and LCSW.   Patient Self Care Activities related to HTN, HLD, and DMII:   Patient is unable to independently self-manage chronic health conditions  Please see past updates related to this goal by clicking on the "Past Updates" button in the selected goal        RNCM: "I am eating much better now" (pt-stated)        CARE PLAN ENTRY (see longitudinal plan of care for additional care plan information)  Current Barriers:   Knowledge Deficits related to diet and nutrition during sick days events as evidence of elevated hemoglobin A1C, decreased appetite, and nausea after COVID vaccination.   Lacks caregiver support.   Nurse Case Manager Clinical Goal(s):    Over the next 90 days, patient will verbalize understanding of plan for maintaining nutritional status even during sick days   Over the next 90 days, patient will work with [facility] and pcp to address needs related to diet and nutrition for chronic disease management and wellness   Over the next 90 days, the patient will demonstrate ongoing self health care management ability as evidenced by maintaining weight, decrease in hemoglobin A1C, stable blood glucose readings, no loss of appetite, and no nausea.   Interventions:   Inter-disciplinary care team collaboration (see longitudinal plan of care)  Advised patient to call the pcp for changes in nutritional status and any further loss of appetite.   Provided education to patient re: watching fruits high in glucose. The patient states that she was eating a lot of oranges when she had a loss of appetite and she feels this is why her blood sugars were so high. The patient has been working on  this and her blood sugars have dropped to 90-120's.  Discussed plans with patient for ongoing care management follow up and provided patient with direct contact information for care management team  Advised patient, providing education and rationale, to check cbg daily and record, calling pcp for findings outside established parameters. The patient has a new meter and is taking her blood sugars daily. Praised for accomplishments.   Assessed the patients current dietary habits. The patient states she is feeling much better and is back to her normal dietary habits. Her blood glucose this am was 100.  The patient states she is hopeful her hemoglobin A1C will be WNL at the next provider visit.    Patient Self Care Activities:   Patient verbalizes understanding of plan to maintain nutritional status even when she is having sick day episodes  Self administers medications as prescribed  Attends all scheduled provider appointments  Calls provider office for new  concerns or questions  Unable to independently manage nutritional needs as evidence of abnormal labs, loss of appetite and fatigue after receiving Covid vaccination in March.   Please see past updates related to this goal by clicking on the "Past Updates" button in the selected goal          Plan:   The care management team will reach out to the patient again over the next 60 to 90 days.    Noreene Larsson RN, MSN, [place] [facility] Mobile: [phone]

## 2019-08-16 ENCOUNTER — Encounter: Payer: Self-pay | Admitting: Family Medicine

## 2019-08-16 ENCOUNTER — Ambulatory Visit
Admission: RE | Admit: 2019-08-16 | Discharge: 2019-08-16 | Disposition: A | Discharge status: Home / Self Care | Payer: PPO | Source: Ambulatory Visit | Attending: Family Medicine | Admitting: Family Medicine

## 2019-08-16 ENCOUNTER — Other Ambulatory Visit: Payer: Self-pay | Admitting: Family Medicine

## 2019-08-16 ENCOUNTER — Telehealth: Payer: Self-pay

## 2019-08-16 DIAGNOSIS — R1013 Epigastric pain: Secondary | ICD-10-CM | POA: Diagnosis not present

## 2019-08-16 DIAGNOSIS — M810 Age-related osteoporosis without current pathological fracture: Secondary | ICD-10-CM | POA: Insufficient documentation

## 2019-08-16 DIAGNOSIS — Z780 Asymptomatic menopausal state: Secondary | ICD-10-CM | POA: Diagnosis not present

## 2019-08-16 DIAGNOSIS — Z13820 Encounter for screening for osteoporosis: Secondary | ICD-10-CM | POA: Insufficient documentation

## 2019-08-16 MED ORDER — ALENDRONATE SODIUM 70 MG PO TABS: 70.0000 mg | ORAL_TABLET | ORAL | 1 refills | Status: DC

## 2019-08-16 NOTE — Chronic Care Management (AMB) (Signed)
Chronic Care Management   Follow Up Note   08/15/2019 Name: Summer Young MRN: [id number] DOB: 02/13/1951  Referred by: [name], FNP Reason for referral : Chronic Care Management (Patient Phone Call)   Summer Young is a 69 y.o. year old female who is a primary care patient of [name], FNP. The CCM team was consulted for assistance with chronic disease management and care coordination needs.    I reached out to [facility] by phone today.   Review of patient status, including review of consultants reports, relevant laboratory and other test results, and collaboration with appropriate care team members and the patient's provider was performed as part of comprehensive patient evaluation and provision of chronic care management services.     Outpatient Encounter Medications as of 08/15/2019  Medication Sig Note  . dicyclomine (BENTYL) 10 MG capsule Take 1 capsule (10 mg total) by mouth 4 (four) times daily -  before meals and at bedtime for 10 days.   . insulin degludec (TRESIBA FLEXTOUCH) 100 UNIT/ML SOPN FlexTouch Pen Inject 0.26 mLs (26 Units total) into the skin daily. (Patient taking differently: Inject 20 Units into the skin daily. )   . metFORMIN (GLUCOPHAGE-XR) 500 MG 24 hr tablet TAKE 3 TABLETS BY MOUTH ONCE DAILY WITH BREAKFAST   . OZEMPIC, 1 MG/DOSE, 2 MG/1.5ML SOPN INJECT 1 MG SUBCUTANEOUSLY ONCE A WEEK   . Ascorbic Acid (VITAMIN C) 1000 MG tablet Take 1,000 mg by mouth daily as needed.  09/20/2018: As needed to prevent illness  . atorvastatin (LIPITOR) 40 MG tablet Take 1 tablet (40 mg total) by mouth daily.   . Biotin 1000 MCG tablet Take 4,000 mcg by mouth every other day.  01/19/2018: Takes every other day  . blood glucose meter kit and supplies Dispense based on patient and insurance preference. Use up to four times daily as directed. (FOR ICD-10 E10.9, E11.9).   Marland Kitchen Cholecalciferol (VITAMIN D3) 50 MCG (2000 UT) capsule Take 1,000 Units by mouth daily.    .  diclofenac Sodium (VOLTAREN) 1 % GEL Apply 2 g topically 3 (three) times daily as needed.   . meloxicam (MOBIC) 15 MG tablet Take 1 tablet (15 mg total) by mouth daily as needed for pain.   . polyethylene glycol powder (GLYCOLAX/MIRALAX) 17 GM/SCOOP powder Take 17 g by mouth 2 (two) times daily as needed. (Patient not taking: Reported on 08/15/2019)   . spironolactone (ALDACTONE) 25 MG tablet Take 1 tablet by mouth once daily   . vitamin B-12 (CYANOCOBALAMIN) 1000 MCG tablet Take 1,000 mcg by mouth daily.    . vitamin E 400 UNIT capsule Take by mouth.    No facility-administered encounter medications on file as of 08/15/2019.    Goals Addressed            This Visit's Progress   . PharmD - Medication Management       CARE PLAN ENTRY (see longitudinal plan of care for additional care plan information)  Current Barriers:  . Chronic Disease Management support, education, and care coordination needs related to HTN, HLD, and DMII  . Financial Barriers  Pharmacist Clinical Goal(s):  Marland Kitchen Over the next 30 days, patient will work with CM Pharmacist to address needs related to medication assistance and diabetes management  Interventions: . Perform chart review . Follow up with patient regarding abdominal pain/GI symtpoms.  o Reports abdominal pain improved and now having regular stools for past couple of days o Reports taking dicyclomine 10 mg  four times daily as directed  . Follow up regarding blood sugar control and montioring o Reports currently taking:  Tresiba 20 units once daily  Metformin ER 500 mg - 3 tablets daily with breakfast  Ozempic 1 mg once weekly o Reports recent morning CBGs running: 100-128   o Denies recent s/s of low blood sugars o Reports continuing to generally eat regular well-balanced meals and limiting carbohydrate portion sizes o Confirms received refills of both [place] and Ozempic through [employer] patient assistance program . Coordination of care:   o Patient confirms that she is scheduled to complete DEXA Scan tomorrow o Reports she will go to office to complete H. Pylori breath test tomorrow as well . Patient denies further medication questions/concerns today.  Patient Self Care Activities:  . Self administers medications as prescribed . Making dietary changes to help control her blood sugar: o focusing on portion sizes o avoiding dietary indiscretions . Attends all scheduled provider appointments . Calls pharmacy for medication refills . Calls provider office for new concerns or questions . Checks blood sugars as directed o Keeps log in App on phone   Please see past updates related to this goal by clicking on the "Past Updates" button in the selected goal         Plan  The care management team will reach out to the patient again over the next 30 days.   [name], PharmD, [place] [employer] [phone]

## 2019-08-16 NOTE — Patient Instructions (Signed)
Thank you allowing the Chronic Care Management Team to be a part of your care! It was a pleasure speaking with you today!     CCM (Chronic Care Management) Team    Noreene Larsson RN, MSN, CCM Nurse Care Coordinator  [phone]   [name] PharmD  Clinical Pharmacist  [phone]   Eula Fried LCSW Clinical Social Worker [phone]  Visit Information  Goals Addressed            This Visit's Progress   . PharmD - Medication Management       CARE PLAN ENTRY (see longitudinal plan of care for additional care plan information)  Current Barriers:  . Chronic Disease Management support, education, and care coordination needs related to HTN, HLD, and DMII  . Financial Barriers  Pharmacist Clinical Goal(s):  Marland Kitchen Over the next 30 days, patient will work with CM Pharmacist to address needs related to medication assistance and diabetes management  Interventions: . Perform chart review . Follow up with patient regarding abdominal pain/GI symtpoms.  o Reports abdominal pain improved and now having regular stools for past couple of days o Reports taking dicyclomine 10 mg four times daily as directed  . Follow up regarding blood sugar control and montioring o Reports currently taking:  Tresiba 20 units once daily  Metformin ER 500 mg - 3 tablets daily with breakfast  Ozempic 1 mg once weekly o Reports recent morning CBGs running: 100-128   o Denies recent s/s of low blood sugars o Reports continuing to generally eat regular well-balanced meals and limiting carbohydrate portion sizes o Confirms received refills of both [place] and Ozempic through [employer] patient assistance program . Coordination of care:  o Patient confirms that she is scheduled to complete DEXA Scan tomorrow o Reports she will go to office to complete H. Pylori breath test tomorrow as well . Patient denies further medication questions/concerns today.  Patient Self Care Activities:  . Self  administers medications as prescribed . Making dietary changes to help control her blood sugar: o focusing on portion sizes o avoiding dietary indiscretions . Attends all scheduled provider appointments . Calls pharmacy for medication refills . Calls provider office for new concerns or questions . Checks blood sugars as directed o Keeps log in App on phone   Please see past updates related to this goal by clicking on the "Past Updates" button in the selected goal         Patient verbalizes understanding of instructions provided today.   The care management team will reach out to the patient again over the next 30 days.   [name], PharmD, [place] [employer] [phone]

## 2019-08-17 ENCOUNTER — Other Ambulatory Visit: Payer: Self-pay | Admitting: Family Medicine

## 2019-08-17 LAB — H. PYLORI BREATH TEST: H. pylori Breath Test: NOT DETECTED

## 2019-08-19 ENCOUNTER — Encounter: Payer: Self-pay | Admitting: Family Medicine

## 2019-08-22 ENCOUNTER — Telehealth: Payer: Self-pay

## 2019-08-23 ENCOUNTER — Ambulatory Visit: Payer: Self-pay

## 2019-08-23 NOTE — Chronic Care Management (AMB) (Signed)
  Care Management   Follow Up Note   08/23/2019 Name: Summer Young MRN: [id number] DOB: [birth year]  Referred by: [name], FNP Reason for referral : No chief complaint on file.   [name] is a 69 y.o. year old female who is a primary care patient of [name], FNP. The care management team was consulted for assistance with care management and care coordination needs.    Review of patient status, including review of consultants reports, relevant laboratory and other test results, and collaboration with appropriate care team members and the patient's provider was performed as part of comprehensive patient evaluation and provision of chronic care management services.    LCSW completed CCM outreach attempt today but was unable to reach patient successfully. A HIPPA compliant voice message was left encouraging patient to return call once available. LCSW rescheduled CCM SW appointment as well.  A HIPPA compliant phone message was left for the patient providing contact information and requesting a return call.   Eula Fried, BSW, MSW, [email or web address] Phone: [phone]

## 2019-08-25 ENCOUNTER — Ambulatory Visit (INDEPENDENT_AMBULATORY_CARE_PROVIDER_SITE_OTHER): Payer: PPO | Admitting: Licensed Clinical Social Worker

## 2019-08-25 DIAGNOSIS — R5383 Other fatigue: Secondary | ICD-10-CM

## 2019-08-25 DIAGNOSIS — E1165 Type 2 diabetes mellitus with hyperglycemia: Secondary | ICD-10-CM | POA: Diagnosis not present

## 2019-08-25 DIAGNOSIS — I10 Essential (primary) hypertension: Secondary | ICD-10-CM | POA: Diagnosis not present

## 2019-08-25 NOTE — Chronic Care Management (AMB) (Signed)
Chronic Care Management    Clinical Social Work Follow Up Note  08/25/2019 Name: Summer Young MRN: [id number] DOB: 05/07/1950  Summer Young is a 69 y.o. year old female who is a primary care patient of Summer Young, Summer Raider, FNP. The CCM team was consulted for assistance with Mental Health Counseling and Resources.   Review of patient status, including review of consultants reports, other relevant assessments, and collaboration with appropriate care team members and the patient's provider was performed as part of comprehensive patient evaluation and provision of chronic care management services.    SDOH (Social Determinants of Health) assessments performed: Yes    Outpatient Encounter Medications as of 08/25/2019  Medication Sig Note   alendronate (FOSAMAX) 70 MG tablet Take 1 tablet (70 mg total) by mouth once a week. Take with a full glass of water on an empty stomach.    Ascorbic Acid (VITAMIN C) 1000 MG tablet Take 1,000 mg by mouth daily as needed.  09/20/2018: As needed to prevent illness   atorvastatin (LIPITOR) 40 MG tablet Take 1 tablet (40 mg total) by mouth daily.    Biotin 1000 MCG tablet Take 4,000 mcg by mouth every other day.  01/19/2018: Takes every other day   blood glucose meter kit and supplies Dispense based on patient and insurance preference. Use up to four times daily as directed. (FOR ICD-10 E10.9, E11.9).    Cholecalciferol (VITAMIN D3) 50 MCG (2000 UT) capsule Take 1,000 Units by mouth daily.     diclofenac Sodium (VOLTAREN) 1 % GEL Apply 2 g topically 3 (three) times daily as needed.    dicyclomine (BENTYL) 10 MG capsule Take 1 capsule (10 mg total) by mouth 4 (four) times daily -  before meals and at bedtime for 10 days.    insulin degludec (TRESIBA FLEXTOUCH) 100 UNIT/ML SOPN FlexTouch Pen Inject 0.26 mLs (26 Units total) into the skin daily. (Patient taking differently: Inject 20 Units into the skin daily. )    meloxicam (MOBIC) 15 MG tablet Take 1 tablet (15 mg  total) by mouth daily as needed for pain.    metFORMIN (GLUCOPHAGE-XR) 500 MG 24 hr tablet TAKE 3 TABLETS BY MOUTH ONCE DAILY WITH BREAKFAST    OZEMPIC, 1 MG/DOSE, 2 MG/1.5ML SOPN INJECT 1 MG SUBCUTANEOUSLY ONCE A WEEK    polyethylene glycol powder (GLYCOLAX/MIRALAX) 17 GM/SCOOP powder Take 17 g by mouth 2 (two) times daily as needed. (Patient not taking: Reported on 08/15/2019)    spironolactone (ALDACTONE) 25 MG tablet Take 1 tablet by mouth once daily    vitamin B-12 (CYANOCOBALAMIN) 1000 MCG tablet Take 1,000 mcg by mouth daily.     vitamin E 400 UNIT capsule Take by mouth.    No facility-administered encounter medications on file as of 08/25/2019.     Goals Addressed      SW- "I battle anxiety" (pt-stated)        Current Barriers:   Acute Mental Health needs related to anxiety/stress  Limited social support  Mental Health Concerns   Family and relationship dysfunction  Social Isolation  Suicidal Ideation/Homicidal Ideation: No  Clinical Social Work Goal(s):   Over the next 120 days, patient will work with SW  bi-monthly  by telephone or in person to reduce or manage symptoms related to anxiety   Over the next 120 days, patient will work with SW to address concerns related to gaining mental health support and develop better coping skills to manage symptoms  Interventions:  Patient interviewed and appropriate  assessments performed: GAD 7 and PHQ 9. Scores have improved drastically since last outreach. Patient reports that she went on a vacation to the [place] and was able to spend time with family which increased her ability to cope with anxiety/stress/triggers more effectively.  Patient reports that her personal relationship with her spouse has improved which has alleviated stress for her as well.    Patient shares that she is effectively implementing healthy eating habits into her daily routine to increase her self-care. Positive reinforcement provided for  this achievement.   LCSW provided education on relaxation techniques such as meditation, deep breathing, massage, or yoga that can activate the body's relaxation response and ease symptoms of acute anxiety. LCSW ask that when pt is struggling with difficult emotions and stress that she start this relaxation response process.   Discussed plans with patient for ongoing care management follow up and provided patient with direct contact information for care management team  Advised patient to contact CCM program for any urgent case management needs  Assisted patient/caregiver with obtaining information about health plan benefits  A voluntary and extensive discussion about advanced care planning including explanation and discussion of advanced was undertaken with the patient. Explanation regarding healthcare proxy and living will was reviewed and packet with forms with explanation of how to fill them out was given.    Encouraged patient to consider long term follow up therapy/counseling  Brief CBTprovided during session   LCSW has sent a secure email with mental health resources during a previous outreach. LCSW provided brief mental health resource education on 08/25/19 but again patient denies wanting to pursue these resources at this time but is appreciative of the resource education.   Patient Self Care Activities:   Attends all scheduled provider appointments  Patient Coping Strengths:   Spirituality  Hopefulness  Patient Self Care Deficits:   Lack of socialization and strong support network  Please see past updates related to this goal by clicking on the "Past Updates" button in the selected goal       Follow Up Plan: SW will follow up with patient by phone over the next quarter  Eula Fried, [place], MSW, [email or web address] Phone: [phone]

## 2019-09-12 DIAGNOSIS — Z01818 Encounter for other preprocedural examination: Secondary | ICD-10-CM | POA: Diagnosis not present

## 2019-09-12 DIAGNOSIS — H26491 Other secondary cataract, right eye: Secondary | ICD-10-CM | POA: Diagnosis not present

## 2019-09-12 DIAGNOSIS — H25812 Combined forms of age-related cataract, left eye: Secondary | ICD-10-CM | POA: Diagnosis not present

## 2019-09-12 DIAGNOSIS — E119 Type 2 diabetes mellitus without complications: Secondary | ICD-10-CM | POA: Diagnosis not present

## 2019-09-14 ENCOUNTER — Ambulatory Visit: Payer: Self-pay | Admitting: Pharmacist

## 2019-09-14 ENCOUNTER — Telehealth: Payer: Self-pay

## 2019-09-14 DIAGNOSIS — E1165 Type 2 diabetes mellitus with hyperglycemia: Secondary | ICD-10-CM

## 2019-09-14 DIAGNOSIS — I10 Essential (primary) hypertension: Secondary | ICD-10-CM | POA: Diagnosis not present

## 2019-09-14 NOTE — Telephone Encounter (Signed)
Error

## 2019-09-14 NOTE — Chronic Care Management (AMB) (Signed)
Chronic Care Management   Follow Up Note   09/14/2019 Name: Cambelle Suchecki MRN: [id number] DOB: 1950/05/30  Referred by: [name], FNP Reason for referral : Chronic Care Management (Patient Phone Call)   Summer Young is a 69 y.o. year old female who is a primary care patient of [name], FNP. The CCM team was consulted for assistance with chronic disease management and care coordination needs.    I reached out to [facility] by phone today.   Review of patient status, including review of consultants reports, relevant laboratory and other test results, and collaboration with appropriate care team members and the patient's provider was performed as part of comprehensive patient evaluation and provision of chronic care management services.     Outpatient Encounter Medications as of 09/14/2019  Medication Sig Note   alendronate (FOSAMAX) 70 MG tablet Take 1 tablet (70 mg total) by mouth once a week. Take with a full glass of water on an empty stomach.    Ascorbic Acid (VITAMIN C) 1000 MG tablet Take 1,000 mg by mouth daily as needed.  09/20/2018: As needed to prevent illness   atorvastatin (LIPITOR) 40 MG tablet Take 1 tablet (40 mg total) by mouth daily.    Biotin 1000 MCG tablet Take 4,000 mcg by mouth every other day.  01/19/2018: Takes every other day   blood glucose meter kit and supplies Dispense based on patient and insurance preference. Use up to four times daily as directed. (FOR ICD-10 E10.9, E11.9).    Cholecalciferol (VITAMIN D3) 50 MCG (2000 UT) capsule Take 1,000 Units by mouth daily.     diclofenac Sodium (VOLTAREN) 1 % GEL Apply 2 g topically 3 (three) times daily as needed.    dicyclomine (BENTYL) 10 MG capsule Take 1 capsule (10 mg total) by mouth 4 (four) times daily -  before meals and at bedtime for 10 days.    insulin degludec (TRESIBA FLEXTOUCH) 100 UNIT/ML SOPN FlexTouch Pen Inject 0.26 mLs (26 Units total) into the skin daily. (Patient taking  differently: Inject 20 Units into the skin daily. )    meloxicam (MOBIC) 15 MG tablet Take 1 tablet (15 mg total) by mouth daily as needed for pain.    metFORMIN (GLUCOPHAGE-XR) 500 MG 24 hr tablet TAKE 3 TABLETS BY MOUTH ONCE DAILY WITH BREAKFAST    OZEMPIC, 1 MG/DOSE, 2 MG/1.5ML SOPN INJECT 1 MG SUBCUTANEOUSLY ONCE A WEEK    polyethylene glycol powder (GLYCOLAX/MIRALAX) 17 GM/SCOOP powder Take 17 g by mouth 2 (two) times daily as needed. (Patient not taking: Reported on 08/15/2019)    spironolactone (ALDACTONE) 25 MG tablet Take 1 tablet by mouth once daily    vitamin B-12 (CYANOCOBALAMIN) 1000 MCG tablet Take 1,000 mcg by mouth daily.     vitamin E 400 UNIT capsule Take by mouth.    No facility-administered encounter medications on file as of 09/14/2019.    Goals Addressed            This Visit's Progress    PharmD - Medication Management       CARE PLAN ENTRY (see longitudinal plan of care for additional care plan information)  Current Barriers:   Chronic Disease Management support, education, and care coordination needs related to HTN, HLD, and DMII   Financial Barriers  Pharmacist Clinical Goal(s):   Over the next 30 days, patient will work with CM Pharmacist to address needs related to medication assistance and diabetes management  Interventions:  Reports she is scheduled  to have cataract surgery on her left eye on 8/13  Reports her blood sugar has been okay. Reschedule appointment to review blood sugar results and medications as patient currently driving  Denies any medication questions/concerns today.  Patient Self Care Activities:   Self administers medications as prescribed  Making dietary changes to help control her blood sugar: o focusing on portion sizes o avoiding dietary indiscretions  Attends all scheduled provider appointments  Calls pharmacy for medication refills  Calls provider office for new concerns or questions  Checks blood sugars  as directed o Keeps log in App on phone   Please see past updates related to this goal by clicking on the "Past Updates" button in the selected goal         Plan  Telephone follow up appointment with care management team member scheduled for: 8/4 at 4:30 pm  [name], PharmD, [place] [phone]

## 2019-09-14 NOTE — Patient Instructions (Signed)
Thank you allowing the Chronic Care Management Team to be a part of your care! It was a pleasure speaking with you today!     CCM (Chronic Care Management) Team    Noreene Larsson RN, MSN, CCM Nurse Care Coordinator  [phone]   [name] PharmD  Clinical Pharmacist  [phone]   Eula Fried LCSW Clinical Social Worker [phone]  Visit Information  Goals Addressed            This Visit's Progress    PharmD - Medication Management       CARE PLAN ENTRY (see longitudinal plan of care for additional care plan information)  Current Barriers:   Chronic Disease Management support, education, and care coordination needs related to HTN, HLD, and DMII   Financial Barriers  Pharmacist Clinical Goal(s):   Over the next 30 days, patient will work with CM Pharmacist to address needs related to medication assistance and diabetes management  Interventions:  Reports she is scheduled to have cataract surgery on her left eye on 8/13  Reports her blood sugar has been okay. Reschedule appointment to review blood sugar results and medications as patient currently driving  Denies any medication questions/concerns today.  Patient Self Care Activities:   Self administers medications as prescribed  Making dietary changes to help control her blood sugar: o focusing on portion sizes o avoiding dietary indiscretions  Attends all scheduled provider appointments  Calls pharmacy for medication refills  Calls provider office for new concerns or questions  Checks blood sugars as directed o Keeps log in App on phone   Please see past updates related to this goal by clicking on the "Past Updates" button in the selected goal         Patient verbalizes understanding of instructions provided today.   Telephone follow up appointment with care management team member scheduled for: 8/4 at 4:30 pm  [name], PharmD, [place] [phone]

## 2019-09-19 ENCOUNTER — Other Ambulatory Visit: Payer: Self-pay

## 2019-09-19 ENCOUNTER — Encounter: Payer: Self-pay | Admitting: Family Medicine

## 2019-09-19 ENCOUNTER — Ambulatory Visit (INDEPENDENT_AMBULATORY_CARE_PROVIDER_SITE_OTHER): Payer: PPO | Admitting: Family Medicine

## 2019-09-19 VITALS — BP 124/74 | HR 81 | Temp 98.6°F | Ht 62.0 in | Wt 165.2 lb

## 2019-09-19 DIAGNOSIS — Z1231 Encounter for screening mammogram for malignant neoplasm of breast: Secondary | ICD-10-CM | POA: Diagnosis not present

## 2019-09-19 DIAGNOSIS — Z0289 Encounter for other administrative examinations: Secondary | ICD-10-CM | POA: Diagnosis not present

## 2019-09-19 DIAGNOSIS — I10 Essential (primary) hypertension: Secondary | ICD-10-CM | POA: Diagnosis not present

## 2019-09-19 DIAGNOSIS — R1084 Generalized abdominal pain: Secondary | ICD-10-CM | POA: Diagnosis not present

## 2019-09-19 DIAGNOSIS — E1165 Type 2 diabetes mellitus with hyperglycemia: Secondary | ICD-10-CM

## 2019-09-19 DIAGNOSIS — Z23 Encounter for immunization: Secondary | ICD-10-CM | POA: Diagnosis not present

## 2019-09-19 DIAGNOSIS — Z20822 Contact with and (suspected) exposure to covid-19: Secondary | ICD-10-CM | POA: Diagnosis not present

## 2019-09-19 LAB — POCT GLYCOSYLATED HEMOGLOBIN (HGB A1C): Hemoglobin A1C: 7.3 % — AB (ref 4.0–5.6)

## 2019-09-19 NOTE — Assessment & Plan Note (Signed)
Reviewed outstanding mammogram order.  Patient requesting contact information for mammogram location.  Reviewed importance of early detection for breast cancer.  Patient in agreement and will contact location to schedule.  Plan: 1. Complete mammogram

## 2019-09-19 NOTE — Assessment & Plan Note (Addendum)
ControlledDM with A1c 7.3% improved from 7.8% on 04/08/2019 and goal A1c < 7.0%. - Complications - hyperglycemia, hypoglycemia and cataracts.  Plan:  1. Continue current therapy: tresiba 20 units daily, metformin XR 500mg  3 tablets with breakfast daily, ozempic 1mg  subcutaneously weekly 2. Encourage improved lifestyle: -to increase protein intake with breakfast to prevent after breakfast low glucose readings - low carb/low glycemic diet reinforced prior education - Increase physical activity to 30 minutes most days of the week.  Explained that increased physical activity increases body's use of sugar for energy. 3. Check fasting am CBG and log these.  Bring log to next visit for review 4. Continue Statin 5. Up to date on foot exam and DM eye screening 6. Follow-up in 3 months

## 2019-09-19 NOTE — Assessment & Plan Note (Signed)
Controlled hypertension.  BP is at goal < 130/80.  Pt is working on lifestyle modifications.    Complications: X9BZ, hyperlipidemia, obesity  Plan: 1. Continue with lifestyle modifications 2. Obtain labs before next visit  3. Encouraged heart healthy diet and increasing exercise to 30 minutes most days of the week, going no more than 2 days in a row without exercise. 4. Check BP 1-2 x per week at home, keep log, and bring to clinic at next appointment. 5. Follow up 3 months.

## 2019-09-19 NOTE — Assessment & Plan Note (Signed)
Reports resolved and has managed constipation since last visit.  No new concerns.  Plan: 1. Continue current bowel regimen.

## 2019-09-19 NOTE — Patient Instructions (Signed)
Your medication refills have been sent to your pharmacy on file.  Continue to increase your protein with your breakfast to help decrease the morning low glucoses after breakfast.  For Mammogram screening for breast cancer   Call the [facility] below anytime to schedule your own appointment now that order has been placed.  [facility] [place],  [address] Phone: [phone]  [facility] [address] [place],  [address] Phone: [phone]  You can learn more information online about your diabetes at American Diabetes Association: http://www.diabetes.org/ - General self-care (diet, medications, blood sugar checks). - Diet recommendations - There are even recipes available for you to look at and try.  We will plan to see you back in 3 months for hypertension and diabetes follow up visit  You will receive a survey after today's visit either digitally by e-mail or paper by [place] mail. Your experiences and feedback matter to [place].  Please respond so we know how we are doing as we provide care for you.  Call us with any questions/concerns/needs.  It is my goal to be available to you for your health concerns.  Thanks for choosing me to be a partner in your healthcare needs!  Harlin Rain, FNP-C Family Nurse Practitioner [facility] Phone: [phone]

## 2019-09-19 NOTE — Assessment & Plan Note (Signed)
Discussed need for Pneumococcal 23 vaccination.  Patient requesting to defer until follow up visit in 3 months.  Plan: 1. Review needed vaccinations at next visit.

## 2019-09-19 NOTE — Progress Notes (Signed)
Subjective:    Patient ID: Summer Young, female    DOB: Apr 24, 1950, 69 y.o.   MRN: [id number]  Summer Young is a 69 y.o. female presenting on 09/19/2019 for Diabetes (pt reports some low blood sugar in the 70's. She started to sweaty, feeling lof faintiness. She took a glucose tablets and symptoms improved. )   HPI   Diabetes Pt presents today for follow up Type 2 Diabetes Mellitus.  He/she (caps): She ACTION; IS/IS NOT: is checking AM CBG at home with range of 82-134. -Current diabetic medications include: tresiba 20 units daily, metformin XR 500mg  (3 tablets with breakfast daily), and ozempic 1mg  subcutaneously weekly -ACTION; IS/IS NOT: is currently symptomatic -Actions; denies/reports/admits to: admits to diaphoresis after breakfast a few times with glucoses in the 70's -Denies polydipsia, polyphagia, polyuria, headaches, shakiness, chills, pain, numbness or tingling in extremities or changes in vision -Clinical course has been improving  -Reports no structured exercise routine -Diet is high in salt, high in fat, and high in carbohydrates  PREVENTION Eye exam current (within 1 year) Current Foot exam current (within 1 year) Current Lipid/ASCVD risk reduction - on statin: YES/NO: Yes  Kidney Protection (On ACE/ARB)? YES/NO: No   Hypertension - She is not checking BP at home or outside of clinic.    - Current medications: lifestyle modifications controlled, no medications - She is not currently symptomatic. - Pt denies headache, lightheadedness, dizziness, changes in vision, chest tightness/pressure, palpitations, leg swelling, sudden loss of speech or loss of consciousness. - She  reports no regular exercise routine. - Her diet is moderate in salt, moderate in fat, and moderate in carbohydrates.  Depression screen [facility] 2/9 06/06/2019 05/31/2019 12/23/2018  Decreased Interest 0 0 1  Down, Depressed, Hopeless 0 0 2  PHQ - 2 Score 0 0 3  Altered sleeping - - 2  Tired, decreased  energy - - 1  Change in appetite - - 1  Feeling bad or failure about yourself  - - 1  Trouble concentrating - - 0  Moving slowly or fidgety/restless - - 0  Suicidal thoughts - - 0  PHQ-9 Score - - 8  Difficult doing work/chores - - Somewhat difficult    Social History   Tobacco Use  . Smoking status: Never Smoker  . Smokeless tobacco: Never Used  Vaping Use  . Vaping Use: Never used  Substance Use Topics  . Alcohol use: Yes    Alcohol/week: 0.0 - 1.0 standard drinks    Comment: occasional  . Drug use: Never    Review of Systems  Constitutional: Negative.   HENT: Negative.   Eyes: Negative.   Respiratory: Negative.   Cardiovascular: Negative.   Gastrointestinal: Negative.   Endocrine: Negative.   Genitourinary: Negative.   Musculoskeletal: Negative.   Skin: Negative.   Allergic/Immunologic: Negative.   Neurological: Negative.   Hematological: Negative.   Psychiatric/Behavioral: Negative.    Per HPI unless specifically indicated above     Objective:    BP 124/74 (BP Location: Left Arm, Patient Position: Sitting, Cuff Size: Normal)   Pulse 81   Temp 98.6 F (37 C) (Oral)   Ht 5\' 2"  (1.575 m)   Wt 165 lb 3.2 oz (74.9 kg)   BMI 30.22 kg/m   Wt Readings from Last 3 Encounters:  09/19/19 165 lb 3.2 oz (74.9 kg)  08/11/19 165 lb (74.8 kg)  06/16/19 167 lb (75.8 kg)    Physical Exam Vitals reviewed.  Constitutional:  General: She is not in acute distress.    Appearance: Normal appearance. She is well-developed and well-groomed. She is not ill-appearing or toxic-appearing.  HENT:     Head: Normocephalic and atraumatic.     Nose:     Comments: [name] is in place, covering mouth and nose. Eyes:     General: Lids are normal. Vision grossly intact.        Right eye: No discharge.        Left eye: No discharge.     Extraocular Movements: Extraocular movements intact.     Conjunctiva/sclera: Conjunctivae normal.     Pupils: Pupils are equal, round, and  reactive to light.  Cardiovascular:     Rate and Rhythm: Normal rate and regular rhythm.     Pulses: Normal pulses.          Dorsalis pedis pulses are 2+ on the right side and 2+ on the left side.     Heart sounds: Normal heart sounds. No murmur heard.  No friction rub. No gallop.   Pulmonary:     Effort: Pulmonary effort is normal. No respiratory distress.     Breath sounds: Normal breath sounds.  Musculoskeletal:     Right lower leg: No edema.     Left lower leg: No edema.  Skin:    General: Skin is warm and dry.     Capillary Refill: Capillary refill takes less than 2 seconds.  Neurological:     General: No focal deficit present.     Mental Status: She is alert and oriented to person, place, and time.  Psychiatric:        Attention and Perception: Attention and perception normal.        Mood and Affect: Mood and affect normal.        Speech: Speech normal.        Behavior: Behavior normal. Behavior is cooperative.        Thought Content: Thought content normal.        Cognition and Memory: Cognition and memory normal.        Judgment: Judgment normal.    Results for orders placed or performed in visit on 09/19/19  POCT glycosylated hemoglobin (Hb A1C)  Result Value Ref Range   Hemoglobin A1C 7.3 (A) 4.0 - 5.6 %   HbA1c POC (<> result, manual entry)     HbA1c, POC (prediabetic range)     HbA1c, POC (controlled diabetic range)        Assessment & Plan:   Problem List Items Addressed This Visit      Cardiovascular and Mediastinum   Essential hypertension    Controlled hypertension.  BP is at goal < 130/80.  Pt is working on lifestyle modifications.    Complications: B5ZW, hyperlipidemia, obesity  Plan: 1. Continue with lifestyle modifications 2. Obtain labs before next visit  3. Encouraged heart healthy diet and increasing exercise to 30 minutes most days of the week, going no more than 2 days in a row without exercise. 4. Check BP 1-2 x per week at home, keep log,  and bring to clinic at next appointment. 5. Follow up 3 months.           Endocrine   Diabetes mellitus ([place]) - Primary    ControlledDM with A1c 7.3% improved from 7.8% on 04/08/2019 and goal A1c < 7.0%. - Complications - hyperglycemia, hypoglycemia and cataracts.  Plan:  1. Continue current therapy: tresiba 20 units daily, metformin XR 500mg  3  tablets with breakfast daily, ozempic 1mg  subcutaneously weekly 2. Encourage improved lifestyle: -to increase protein intake with breakfast to prevent after breakfast low glucose readings - low carb/low glycemic diet reinforced prior education - Increase physical activity to 30 minutes most days of the week.  Explained that increased physical activity increases body's use of sugar for energy. 3. Check fasting am CBG and log these.  Bring log to next visit for review 4. Continue Statin 5. Up to date on foot exam and DM eye screening 6. Follow-up in 3 months        Relevant Orders   POCT glycosylated hemoglobin (Hb A1C) (Completed)     Other   Abdominal pain    Reports resolved and has managed constipation since last visit.  No new concerns.  Plan: 1. Continue current bowel regimen.      Screening for breast cancer    Reviewed outstanding mammogram order.  Patient requesting contact information for mammogram location.  Reviewed importance of early detection for breast cancer.  Patient in agreement and will contact location to schedule.  Plan: 1. Complete mammogram      Relevant Orders   MM 3D SCREEN BREAST BILATERAL   Immunization due    Discussed need for Pneumococcal 23 vaccination.  Patient requesting to defer until follow up visit in 3 months.  Plan: 1. Review needed vaccinations at next visit.         No orders of the defined types were placed in this encounter.     Follow up plan: Return in about 3 months (around 12/20/2019) for HTN, DM A1C follow up.   Harlin Rain, [place] Family Nurse Practitioner [facility] 09/19/2019, 10:33 AM

## 2019-09-21 ENCOUNTER — Telehealth: Payer: Self-pay | Admitting: Pharmacist

## 2019-09-21 ENCOUNTER — Telehealth: Payer: Self-pay

## 2019-09-21 NOTE — Telephone Encounter (Signed)
°  Chronic Care Management   Outreach Note  09/21/2019 Name: Summer Young MRN: [id number] DOB: 1950-04-19  Referred by: [name], FNP Reason for referral : No chief complaint on file.  Was unable to reach patient via telephone today and left HIPAA compliant voicemail asking patient to return my call.  Receive call back from [name]. Patient reports she is doing well. Note patient seen by PCP on 8/2. Reschedule telephone appointment with patient as requested.   Follow Up Plan: Telephone visit scheduled with CM Pharmacist for: 10/6 at 4:30 pm  [name], PharmD, [place] [phone]

## 2019-09-30 DIAGNOSIS — H25812 Combined forms of age-related cataract, left eye: Secondary | ICD-10-CM | POA: Diagnosis not present

## 2019-09-30 DIAGNOSIS — H2512 Age-related nuclear cataract, left eye: Secondary | ICD-10-CM | POA: Diagnosis not present

## 2019-10-19 ENCOUNTER — Encounter: Payer: Self-pay | Admitting: Family Medicine

## 2019-10-19 ENCOUNTER — Telehealth: Payer: Self-pay

## 2019-10-19 NOTE — Telephone Encounter (Signed)
Called and notified the patient that her medication arrived and is available for pick up.

## 2019-10-20 ENCOUNTER — Telehealth: Payer: Self-pay | Admitting: General Practice

## 2019-10-20 ENCOUNTER — Ambulatory Visit: Payer: Self-pay | Admitting: General Practice

## 2019-10-20 DIAGNOSIS — M810 Age-related osteoporosis without current pathological fracture: Secondary | ICD-10-CM

## 2019-10-20 DIAGNOSIS — G8929 Other chronic pain: Secondary | ICD-10-CM

## 2019-10-20 DIAGNOSIS — E1165 Type 2 diabetes mellitus with hyperglycemia: Secondary | ICD-10-CM

## 2019-10-20 DIAGNOSIS — E782 Mixed hyperlipidemia: Secondary | ICD-10-CM

## 2019-10-20 DIAGNOSIS — I10 Essential (primary) hypertension: Secondary | ICD-10-CM

## 2019-10-20 NOTE — Patient Instructions (Addendum)
Visit Information  Goals Addressed              This Visit's Progress   .  RNCM: "I am doing much better now" (pt-stated)        CARE PLAN ENTRY (see longtitudinal plan of care for additional care plan information)  Current Barriers:  . Chronic Disease Management support, education, and care coordination needs related to HTN, HLD, and DMII  Clinical Goal(s) related to HTN, HLD, and DMII:  Over the next 120 days, patient will:  . Work with the care management team to address educational, disease management, and care coordination needs  . Begin or continue self health monitoring activities as directed today Measure and record cbg (blood glucose) 1 times daily and adhere to a heart healthy/ADA diet  . Call provider office for new or worsened signs and symptoms Blood pressure findings outside established parameters and New or worsened symptom related to HLD/HTN or other chronic conditions  . Call care management team with questions or concerns . Verbalize basic understanding of patient centered plan of care established today  Interventions related to HTN, HLD, and DMII:  . Evaluation of current treatment plans and patient's adherence to plan as established by provider.  The patient is doing well with current plan of care related to HTN, HLD, DMII.  Marland Kitchen Assessed patient understanding of disease states.  The patient has a good understanding of chronic conditions. Says her  blood sugars are much better now and says this am her glucose was 100. Range is usually 90 to 120.  Is hopeful that her next hemoglobin A1C will be WNL.  Recently had to get another glucose meter, she has the one touch now. 10-20-2019: The patient is pleased that her blood glucose readings are much better. Her range is 70 to 128. Yesterday morning her blood sugar was 98.  Her hemoglobin A1C was 7.3 on 09-19-2019, down from 7.8 in February of 2021.  Praised for accomplishments. The patient verbalized if her A1C is at goal at the next  MD appointment there may be adjustments in her medications regimen. She endorses compliance with her medications.   . Assessed patient's education and care coordination needs.  The patient saw the pcp on 08/11/2019 for some issues with abdominal discomfort and also DM meter. The patient was started on Bentyl and this is helping. 10-20-2019: Abdominal pain has resolved. Is having new increasing pain with back pain (see additional care plan).  The patient educated on hypoglycemia and making sure she is eating high protein meals so she will not have any hypoglycemic events. The patient denies any episodes of hypoglycemia but knows what to look for in the event of blood sugars dropping.  . Provided disease specific education to patient.  Education on maintaining weight and monitoring nutritional intake. The patient realizes that she has to stay away from spicy foods. Is feeling much better this week.  10-20-2019: States her appetite is good and she has not issues with nausea and vomiting. Endorses compliance with a heart healthy/ADA diet. The patient is working  hard and feels good other than the chronic back pain she is dealing with.  Nash Dimmer with appropriate clinical care team members regarding patient needs.  The patient is currently working with CCM team pharmacist and LCSW.   Patient Self Care Activities related to HTN, HLD, and DMII:  . Patient is unable to independently self-manage chronic health conditions  Please see past updates related to this goal by clicking  on the "Past Updates" button in the selected goal      .  COMPLETED: RNCM: "I am eating much better now" (pt-stated)        [place] (see longitudinal plan of care for additional care plan information)  Current Barriers:  Marland Kitchen Knowledge Deficits related to diet and nutrition during sick days events as evidence of elevated hemoglobin A1C, decreased appetite, and nausea after COVID vaccination.  Leodis [place] caregiver support.   Nurse  Case Manager Clinical Goal(s):  Marland Kitchen Over the next 90 days, patient will verbalize understanding of plan for maintaining nutritional status even during sick days  . Over the next 90 days, patient will work with [facility] and pcp to address needs related to diet and nutrition for chronic disease management and wellness  . Over the next 90 days, the patient will demonstrate ongoing self health care management ability as evidenced by maintaining weight, decrease in hemoglobin A1C, stable blood glucose readings, no loss of appetite, and no nausea.   Interventions:  . Inter-disciplinary care team collaboration (see longitudinal plan of care) . Advised patient to call the pcp for changes in nutritional status and any further loss of appetite.  . Provided education to patient re: watching fruits high in glucose. The patient states that she was eating a lot of oranges when she had a loss of appetite and she feels this is why her blood sugars were so high. The patient has been working on this and her blood sugars have dropped to 90-120's. 10-20-2019: The patient has stable blood sugars now. The range has been 70 to 128.  The patient states that yesterday morning her blood sugar was 98.  She had not taken it today. She has a good appetite and is feeling much better now. Will be closing this goal. See additional open care plan for DM management and care.  . Discussed plans with patient for ongoing care management follow up and provided patient with direct contact information for care management team . Advised patient, providing education and rationale, to check cbg daily and record, calling pcp for findings outside established parameters. The patient has a new meter and is taking her blood sugars daily. Praised for accomplishments.  . Assessed the patients current dietary habits. The patient states she is feeling much better and is back to her normal dietary habits. Her blood glucose this am was 100.  The patient states she  is hopeful her hemoglobin A1C will be WNL at the next provider visit.  10-20-2019: The patients hemoglobin A1C on 09-19-2019 was 7.3. This is down from 7.8 in February. It is expected to be lower at the next MD visit.   Patient Self Care Activities:  . Patient verbalizes understanding of plan to maintain nutritional status even when she is having sick day episodes . Self administers medications as prescribed . Attends all scheduled provider appointments . Calls provider office for new concerns or questions . Unable to independently manage nutritional needs as evidence of abnormal labs, loss of appetite and fatigue after receiving Covid vaccination in March.   Please see past updates related to this goal by clicking on the "Past Updates" button in the selected goal      .  RNCM: Pt-"i am having more back pain than usual" (pt-stated)        CARE PLAN ENTRY (see longitudinal plan of care for additional care plan information)  Current Barriers:  Marland Kitchen Knowledge Deficits related to unresolved back pain and best  treatment for chronic back pain and discomfort . Care Coordination needs related to education/support and treatment  in a patient with chronic back pain with increased exacerbation of pain and discomfort.  . Chronic Disease Management support and education needs related to Chronic back pain and discomfort  Nurse Case Manager Clinical Goal(s):  Marland Kitchen Over the next 120 days, patient will verbalize understanding of plan for treatment and support of chronic back pain and discomfort  . Over the next 120 days, patient will work with [facility] team and pcp to address needs related to increased back pain and best treatment options . Over the next 120 days, patient will demonstrate a decrease in back pain exacerbations as evidenced by evaluation and assessment by pcp and a plan of care to help reduce back pain and discomfort.  . Over the next 120 days, patient will attend all scheduled medical appointments:  patient to call the office to secure an appointment with pcp for follow up and evaluation . Over the next 120 days, patient will demonstrate improved health management independence as evidenced byreduced/resolved pain and discomfort and ability to perform ADLS/IADLS without difficulty . Over the next 120 days, patient will demonstrate understanding of rationale for each prescribed medication as evidenced by compliance  . Over the next 120 days, patient will work with CM team pharmacist to review current medication regimen and offer recommendations as warranted.   Interventions:  . Inter-disciplinary care team collaboration (see longitudinal plan of care) . Evaluation of current treatment plan related to back pain and discomfort and patient's adherence to plan as established by provider. . Advised patient to call the provider to get an appointment, write down questions to ask the provider at appointment, and to call for worsening sx/sx of pain or discomfort  . Provided education to patient re: talking to pcp about alternate therapies and if the use of a back brace would benefit due to the type of work she does. 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 She has been having to do extra. She realizes that her back hurts more when she is working extra. Education on pacing activity.  . Reviewed medications with patient and discussed compliance.  The patient is taking meloxicam at night and this helps a lot. She is also using the gel but can not tell that it really helps her. Is open to recommendations.  . Discussed plans with patient for ongoing care management follow up and provided patient with direct contact information for care management team . Provided patient with back pain and safety educational materials related to chronic back pain and discomfort . Reviewed scheduled/upcoming provider appointments including: the patient to call to  get an appointment to see pcp for evaluation and treatment options.  . Pharmacy referral for review of medications and recommendations on how to  help with back pain and discomfort by pharmacological means.   Patient Self Care Activities:  . Patient verbalizes understanding of plan to work with the pcp, RNCM, and CCM team to optimize plan of care for management of chronic back pain and discomfort.  . Performs ADL's independently . Performs IADL's independently . Calls provider office for new concerns or questions . Unable to independently manage chronic back pain as evidence of exacerbation  Initial goal documentation        Patient verbalizes understanding of instructions provided today.   Telephone follow up appointment with care management team  member scheduled for: 12-22-2019 at 0900 am  Noreene Larsson RN, MSN, [facility] [place]: [phone]   Back Exercises These exercises help to make your trunk and back strong. They also help to keep the lower back flexible. Doing these exercises can help to prevent back pain or lessen existing pain.  If you have back pain, try to do these exercises 2-3 times each day or as told by your doctor.  As you get better, do the exercises once each day. Repeat the exercises more often as told by your doctor.  To stop back pain from coming back, do the exercises once each day, or as told by your doctor. Exercises Single knee to chest Do these steps 3-5 times in a row for each leg: 1. Lie on your back on a firm bed or the floor with your legs stretched out. 2. Bring one knee to your chest. 3. Grab your knee or thigh with both hands and hold them it in place. 4. Pull on your knee until you feel a gentle stretch in your lower back or buttocks. 5. Keep doing the stretch for 10-30 seconds. 6. Slowly let go of your leg and straighten it. Pelvic tilt Do these steps  5-10 times in a row: 1. Lie on your back on a firm bed or the floor with your legs stretched out. 2. Bend your knees so they point up to the ceiling. Your feet should be flat on the floor. 3. Tighten your lower belly (abdomen) muscles to press your lower back against the floor. This will make your tailbone point up to the ceiling instead of pointing down to your feet or the floor. 4. Stay in this position for 5-10 seconds while you gently tighten your muscles and breathe evenly. Cat-cow Do these steps until your lower back bends more easily: 1. Get on your hands and knees on a firm surface. Keep your hands under your shoulders, and keep your knees under your hips. You may put padding under your knees. 2. Let your head hang down toward your chest. Tighten (contract) the muscles in your belly. Point your tailbone toward the floor so your lower back becomes rounded like the back of a cat. 3. Stay in this position for 5 seconds. 4. Slowly lift your head. Let the muscles of your belly relax. Point your tailbone up toward the ceiling so your back forms a sagging arch like the back of a cow. 5. Stay in this position for 5 seconds.  Press-ups Do these steps 5-10 times in a row: 1. Lie on your belly (face-down) on the floor. 2. Place your hands near your head, about shoulder-width apart. 3. While you keep your back relaxed and keep your hips on the floor, slowly straighten your arms to raise the top half of your body and lift your shoulders. Do not use your back muscles. You may change where you place your hands in order to make yourself more comfortable. 4. Stay in this position for 5 seconds. 5. Slowly return to lying flat on the floor.  Bridges Do these steps 10 times in a row: 1. Lie on your back on a firm surface. 2. Bend your knees so they point up to the ceiling. Your feet should be flat on the floor. Your arms should be flat at your sides, next to your body. 3. Tighten your butt muscles and  lift your butt off the floor until  your waist is almost as high as your knees. If you do not feel the muscles working in your butt and the back of your thighs, slide your feet 1-2 inches farther away from your butt. 4. Stay in this position for 3-5 seconds. 5. Slowly lower your butt to the floor, and let your butt muscles relax. If this exercise is too easy, try doing it with your arms crossed over your chest. Belly crunches Do these steps 5-10 times in a row: 1. Lie on your back on a firm bed or the floor with your legs stretched out. 2. Bend your knees so they point up to the ceiling. Your feet should be flat on the floor. 3. Cross your arms over your chest. 4. Tip your chin a little bit toward your chest but do not bend your neck. 5. Tighten your belly muscles and slowly raise your chest just enough to lift your shoulder blades a tiny bit off of the floor. Avoid raising your body higher than that, because it can put too much stress on your low back. 6. Slowly lower your chest and your head to the floor. Back lifts Do these steps 5-10 times in a row: 1. Lie on your belly (face-down) with your arms at your sides, and rest your forehead on the floor. 2. Tighten the muscles in your legs and your butt. 3. Slowly lift your chest off of the floor while you keep your hips on the floor. Keep the back of your head in line with the curve in your back. Look at the floor while you do this. 4. Stay in this position for 3-5 seconds. 5. Slowly lower your chest and your face to the floor. Contact a doctor if:  Your back pain gets a lot worse when you do an exercise.  Your back pain does not get better 2 hours after you exercise. If you have any of these problems, stop doing the exercises. Do not do them again unless your doctor says it is okay. Get help right away if:  You have sudden, very bad back pain. If this happens, stop doing the exercises. Do not do them again unless your doctor says it is  okay. This information is not intended to replace advice given to you by your health care provider. Make sure you discuss any questions you have with your health care provider. Document Revised: 10/29/2017 Document Reviewed: 10/29/2017 Elsevier Patient Education  [place].  Chronic Back Pain When back pain lasts longer than 3 months, it is called chronic back pain. Pain may get worse at certain times (flare-ups). There are things you can do at home to manage your pain. Follow these instructions at home: Activity      Avoid bending and other activities that make pain worse.  When standing: ? Keep your upper back and neck straight. ? Keep your shoulders pulled back. ? Avoid slouching.  When sitting: ? Keep your back straight. ? Relax your shoulders. Do not round your shoulders or pull them backward.  Do not sit or stand in one place for long periods of time.  Take short rest breaks during the day. Lying down or standing is usually better than sitting. Resting can help relieve pain.  When sitting or lying down for a long time, do some mild activity or stretching. This will help to prevent stiffness and pain.  Get regular exercise. Ask your doctor what activities are safe for you.  Do not lift anything that is  heavier than 10 lb (4.5 kg). To prevent injury when you lift things: ? Bend your knees. ? Keep the weight close to your body. ? Avoid twisting. Managing pain  If told, put ice on the painful area. Your doctor may tell you to use ice for 24-48 hours after a flare-up starts. ? Put ice in a plastic bag. ? Place a towel between your skin and the bag. ? Leave the ice on for 20 minutes, 2-3 times a day.  If told, put heat on the painful area as often as told by your doctor. Use the heat source that your doctor recommends, such as a moist heat pack or a heating pad. ? Place a towel between your skin and the heat source. ? Leave the heat on for 20-30  minutes. ? Remove the heat if your skin turns bright red. This is especially important if you are unable to feel pain, heat, or cold. You may have a greater risk of getting burned.  Soak in a warm bath. This can help relieve pain.  Take over-the-counter and prescription medicines only as told by your doctor. General instructions  Sleep on a firm mattress. Try lying on your side with your knees slightly bent. If you lie on your back, put a pillow under your knees.  Keep all follow-up visits as told by your doctor. This is important. Contact a doctor if:  You have pain that does not get better with rest or medicine. Get help right away if:  One or both of your arms or legs feel weak.  One or both of your arms or legs lose feeling (numbness).  You have trouble controlling when you poop (bowel movement) or pee (urinate).  You feel sick to your stomach (nauseous).  You throw up (vomit).  You have belly (abdominal) pain.  You have shortness of breath.  You pass out (faint). Summary  When back pain lasts longer than 3 months, it is called chronic back pain.  Pain may get worse at certain times (flare-ups).  Use ice and heat as told by your doctor. Your doctor may tell you to use ice after flare-ups. This information is not intended to replace advice given to you by your health care provider. Make sure you discuss any questions you have with your health care provider. Document Revised: 05/27/2018 Document Reviewed: 09/18/2016 Elsevier Patient Education  2020 [employer].

## 2019-10-20 NOTE — Chronic Care Management (AMB) (Signed)
Chronic Care Management   Follow Up Note   10/20/2019 Name: Summer Young MRN: [id number] DOB: [birth year]  Referred by: [name], FNP Reason for referral : Chronic Care Management (RNCM Follow up for Chronic Disease Management and Care Coordination Needs)   Summer Young is a 69 y.o. year old female who is a primary care patient of [name], FNP. The CCM team was consulted for assistance with chronic disease management and care coordination needs.    Review of patient status, including review of consultants reports, relevant laboratory and other test results, and collaboration with appropriate care team members and the patient's provider was performed as part of comprehensive patient evaluation and provision of chronic care management services.    SDOH (Social Determinants of Health) assessments performed: Yes See Care Plan activities for detailed interventions related to [facility])     Outpatient Encounter Medications as of 10/20/2019  Medication Sig Note   alendronate (FOSAMAX) 70 MG tablet Take 1 tablet (70 mg total) by mouth once a week. Take with a full glass of water on an empty stomach. (Patient not taking: Reported on 09/19/2019)    Ascorbic Acid (VITAMIN C) 1000 MG tablet Take 1,000 mg by mouth daily as needed.  09/20/2018: As needed to prevent illness   atorvastatin (LIPITOR) 40 MG tablet Take 1 tablet (40 mg total) by mouth daily.    Biotin 1000 MCG tablet Take 4,000 mcg by mouth every other day.  01/19/2018: Takes every other day   blood glucose meter kit and supplies Dispense based on patient and insurance preference. Use up to four times daily as directed. (FOR ICD-10 E10.9, E11.9).    Cholecalciferol (VITAMIN D3) 50 MCG (2000 UT) capsule Take 1,000 Units by mouth daily.     diclofenac Sodium (VOLTAREN) 1 % GEL Apply 2 g topically 3 (three) times daily as needed.    dicyclomine (BENTYL) 10 MG capsule Take 1 capsule (10 mg total) by mouth 4 (four) times daily -  before  meals and at bedtime for 10 days.    insulin degludec (TRESIBA FLEXTOUCH) 100 UNIT/ML SOPN FlexTouch Pen Inject 0.26 mLs (26 Units total) into the skin daily. (Patient taking differently: Inject 20 Units into the skin daily. )    meloxicam (MOBIC) 15 MG tablet Take 1 tablet (15 mg total) by mouth daily as needed for pain.    metFORMIN (GLUCOPHAGE-XR) 500 MG 24 hr tablet TAKE 3 TABLETS BY MOUTH ONCE DAILY WITH BREAKFAST    OZEMPIC, 1 MG/DOSE, 2 MG/1.5ML SOPN INJECT 1 MG SUBCUTANEOUSLY ONCE A WEEK    polyethylene glycol powder (GLYCOLAX/MIRALAX) 17 GM/SCOOP powder Take 17 g by mouth 2 (two) times daily as needed. (Patient not taking: Reported on 09/19/2019)    spironolactone (ALDACTONE) 25 MG tablet Take 1 tablet by mouth once daily    vitamin B-12 (CYANOCOBALAMIN) 1000 MCG tablet Take 1,000 mcg by mouth daily.     vitamin E 400 UNIT capsule Take by mouth.    No facility-administered encounter medications on file as of 10/20/2019.     Objective:  BP Readings from Last 3 Encounters:  09/19/19 124/74  08/11/19 129/62  06/16/19 118/71   Lab Results  Component Value Date   HGBA1C 7.3 (A) 09/19/2019    Goals Addressed              This Visit's Progress     RNCM: "I am doing much better now" (pt-stated)        CARE PLAN ENTRY (see  longtitudinal plan of care for additional care plan information)  Current Barriers:   Chronic Disease Management support, education, and care coordination needs related to HTN, HLD, and DMII  Clinical Goal(s) related to HTN, HLD, and DMII:  Over the next 120 days, patient will:   Work with the care management team to address educational, disease management, and care coordination needs   Begin or continue self health monitoring activities as directed today Measure and record cbg (blood glucose) 1 times daily and adhere to a heart healthy/ADA diet   Call provider office for new or worsened signs and symptoms Blood pressure findings outside  established parameters and New or worsened symptom related to HLD/HTN or other chronic conditions   Call care management team with questions or concerns  Verbalize basic understanding of patient centered plan of care established today  Interventions related to HTN, HLD, and DMII:   Evaluation of current treatment plans and patient's adherence to plan as established by provider.  The patient is doing well with current plan of care related to HTN, HLD, DMII.   Assessed patient understanding of disease states.  The patient has a good understanding of chronic conditions. Says her  blood sugars are much better now and says this am her glucose was 100. Range is usually 90 to 120.  Is hopeful that her next hemoglobin A1C will be WNL.  Recently had to get another glucose meter, she has the one touch now. 10-20-2019: The patient is pleased that her blood glucose readings are much better. Her range is 70 to 128. Yesterday morning her blood sugar was 98.  Her hemoglobin A1C was 7.3 on 09-19-2019, down from 7.8 in February of 2021.  Praised for accomplishments. The patient verbalized if her A1C is at goal at the next MD appointment there may be adjustments in her medications regimen. She endorses compliance with her medications.    Assessed patient's education and care coordination needs.  The patient saw the pcp on 08/11/2019 for some issues with abdominal discomfort and also DM meter. The patient was started on Bentyl and this is helping. 10-20-2019: Abdominal pain has resolved. Is having new increasing pain with back pain (see additional care plan).  The patient educated on hypoglycemia and making sure she is eating high protein meals so she will not have any hypoglycemic events. The patient denies any episodes of hypoglycemia but knows what to look for in the event of blood sugars dropping.   Provided disease specific education to patient.  Education on maintaining weight and monitoring nutritional intake. The  patient realizes that she has to stay away from spicy foods. Is feeling much better this week.  10-20-2019: States her appetite is good and she has not issues with nausea and vomiting. Endorses compliance with a heart healthy/ADA diet. The patient is working  hard and feels good other than the chronic back pain she is dealing with.   Collaborated with appropriate clinical care team members regarding patient needs.  The patient is currently working with CCM team pharmacist and LCSW.   Patient Self Care Activities related to HTN, HLD, and DMII:   Patient is unable to independently self-manage chronic health conditions  Please see past updates related to this goal by clicking on the "Past Updates" button in the selected goal        COMPLETED: RNCM: "I am eating much better now" (pt-stated)        CARE PLAN ENTRY (see longitudinal plan of care for additional care  plan information)  Current Barriers:   Knowledge Deficits related to diet and nutrition during sick days events as evidence of elevated hemoglobin A1C, decreased appetite, and nausea after COVID vaccination.   Lacks caregiver support.   Nurse Case Manager Clinical Goal(s):   Over the next 90 days, patient will verbalize understanding of plan for maintaining nutritional status even during sick days   Over the next 90 days, patient will work with [facility] and pcp to address needs related to diet and nutrition for chronic disease management and wellness   Over the next 90 days, the patient will demonstrate ongoing self health care management ability as evidenced by maintaining weight, decrease in hemoglobin A1C, stable blood glucose readings, no loss of appetite, and no nausea.   Interventions:   Inter-disciplinary care team collaboration (see longitudinal plan of care)  Advised patient to call the pcp for changes in nutritional status and any further loss of appetite.   Provided education to patient re: watching fruits high in  glucose. The patient states that she was eating a lot of oranges when she had a loss of appetite and she feels this is why her blood sugars were so high. The patient has been working on this and her blood sugars have dropped to 90-120's. 10-20-2019: The patient has stable blood sugars now. The range has been 70 to 128.  The patient states that yesterday morning her blood sugar was 98.  She had not taken it today. She has a good appetite and is feeling much better now. Will be closing this goal. See additional open care plan for DM management and care.   Discussed plans with patient for ongoing care management follow up and provided patient with direct contact information for care management team  Advised patient, providing education and rationale, to check cbg daily and record, calling pcp for findings outside established parameters. The patient has a new meter and is taking her blood sugars daily. Praised for accomplishments.   Assessed the patients current dietary habits. The patient states she is feeling much better and is back to her normal dietary habits. Her blood glucose this am was 100.  The patient states she is hopeful her hemoglobin A1C will be WNL at the next provider visit.  10-20-2019: The patients hemoglobin A1C on 09-19-2019 was 7.3. This is down from 7.8 in February. It is expected to be lower at the next MD visit.   Patient Self Care Activities:   Patient verbalizes understanding of plan to maintain nutritional status even when she is having sick day episodes  Self administers medications as prescribed  Attends all scheduled provider appointments  Calls provider office for new concerns or questions  Unable to independently manage nutritional needs as evidence of abnormal labs, loss of appetite and fatigue after receiving Covid vaccination in March.   Please see past updates related to this goal by clicking on the "Past Updates" button in the selected goal        RNCM: Pt-"i am  having more back pain than usual" (pt-stated)        CARE PLAN ENTRY (see longitudinal plan of care for additional care plan information)  Current Barriers:   Knowledge Deficits related to unresolved back pain and best treatment for chronic back pain and discomfort  Care Coordination needs related to education/support and treatment  in a patient with chronic back pain with increased exacerbation of pain and discomfort.   Chronic Disease Management support and education needs related to Chronic  back pain and discomfort  Nurse Case Manager Clinical Goal(s):   Over the next 120 days, patient will verbalize understanding of plan for treatment and support of chronic back pain and discomfort   Over the next 120 days, patient will work with [facility], CCM team and pcp to address needs related to increased back pain and best treatment options  Over the next 120 days, patient will demonstrate a decrease in back pain exacerbations as evidenced by evaluation and assessment by pcp and a plan of care to help reduce back pain and discomfort.   Over the next 120 days, patient will attend all scheduled medical appointments: patient to call the office to secure an appointment with pcp for follow up and evaluation  Over the next 120 days, patient will demonstrate improved health management independence as evidenced byreduced/resolved pain and discomfort and ability to perform ADLS/IADLS without difficulty  Over the next 120 days, patient will demonstrate understanding of rationale for each prescribed medication as evidenced by compliance   Over the next 120 days, patient will work with CM team pharmacist to review current medication regimen and offer recommendations as warranted.   Interventions:   Inter-disciplinary care team collaboration (see longitudinal plan of care)  Evaluation of current treatment plan related to back pain and discomfort and patient's adherence to plan as established by  provider.  Advised patient to call the provider to get an appointment, write down questions to ask the provider at appointment, and to call for worsening sx/sx of pain or discomfort   Provided education to patient re: talking to pcp about alternate therapies and if the use of a back brace would benefit due to the type of work she does. 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 She has been having to do extra. She realizes that her back hurts more when she is working extra. Education on pacing activity.   Reviewed medications with patient and discussed compliance.  The patient is taking meloxicam at night and this helps a lot. She is also using the gel but can not tell that it really helps her. Is open to recommendations.   Discussed plans with patient for ongoing care management follow up and provided patient with direct contact information for care management team  Provided patient with back pain and safety educational materials related to chronic back pain and discomfort  Reviewed scheduled/upcoming provider appointments including: the patient to call to get an appointment to see pcp for evaluation and treatment options.   Pharmacy referral for review of medications and recommendations on how to  help with back pain and discomfort by pharmacological means.   Patient Self Care Activities:   Patient verbalizes understanding of plan to work with the pcp, RNCM, and CCM team to optimize plan of care for management of chronic back pain and discomfort.   Performs ADL's independently  Performs IADL's independently  Calls provider office for new concerns or questions  Unable to independently manage chronic back pain as evidence of exacerbation  Initial goal documentation         Plan:   Telephone follow up appointment with care management team member scheduled for: 12-22-2019 at 0900 am   Noreene Larsson RN, MSN,  [place] Mobile: [phone]

## 2019-10-21 ENCOUNTER — Ambulatory Visit: Payer: Self-pay | Admitting: Pharmacist

## 2019-10-21 DIAGNOSIS — E1165 Type 2 diabetes mellitus with hyperglycemia: Secondary | ICD-10-CM

## 2019-10-21 NOTE — Patient Instructions (Signed)
Thank you allowing the Chronic Care Management Team to be a part of your care! It was a pleasure speaking with you today!     CCM (Chronic Care Management) Team    Noreene Larsson RN, MSN, CCM Nurse Care Coordinator  [phone]   [name] PharmD  Clinical Pharmacist  [phone]   Eula Fried LCSW Clinical Social Worker [phone]  Visit Information  Goals Addressed            This Visit's Progress   . PharmD - Medication Management       CARE PLAN ENTRY (see longitudinal plan of care for additional care plan information)  Current Barriers:  . Chronic Disease Management support, education, and care coordination needs related to HTN, HLD, and DMII  . Financial Barriers  Pharmacist Clinical Goal(s):  Marland Kitchen Over the next 30 days, patient will work with CM Pharmacist to address needs related to medication assistance and diabetes management  Interventions: . Receive a voicemail from Ms. Wack stating she just picked up supply of Ozempic from office as received from [employer] patient assistance program, but that refill of [name] was not there. . Place coordination of care call to [employer] patient assistance program. Speak with representative Lytle Michaels. o Lytle Michaels states that the last refill request from office for patient's [name] was not accepted because under "Product name", provider must indicate pen strength. Representative states program unable to take verbal confirmation for this field.  o States will fax new refill request form to provider today . Collaborate with PCP to request provider again complete refill request, specifying product strength, and fax back to program . Follow up with patient to update her on Tresiba refill. Ms. laporsche hoeger understanding and states that she has plenty of Tresiba remaining at this time.  Patient Self Care Activities:  . Self administers medications as prescribed . Making dietary changes to help control her blood  sugar: o focusing on portion sizes o avoiding dietary indiscretions . Attends all scheduled provider appointments . Calls pharmacy for medication refills . Calls provider office for new concerns or questions . Checks blood sugars as directed o Keeps log in App on phone   Please see past updates related to this goal by clicking on the "Past Updates" button in the selected goal         Patient verbalizes understanding of instructions provided today.   Telephone follow up appointment with care management team member scheduled for: 10/6 at 4:30 pm   [name], PharmD, [place] [phone]

## 2019-10-21 NOTE — Chronic Care Management (AMB) (Signed)
Chronic Care Management   Follow Up Note   10/21/2019 Name: Summer Young MRN: [id number] DOB: 03/24/1950  Referred by: [name], FNP Reason for referral : Chronic Care Management (Patient Phone Call) and Care Coordination ([employer])   Summer Young is a 69 y.o. year old female who is a primary care patient of [name], [place]. The CCM team was consulted for assistance with chronic disease management and care coordination needs.    Receive a voicemail from Summer Young requesting a call back. Reports she just picked up supply of Ozempic from office as received from [employer] patient assistance program, but that refill [name] was not there.  Place coordination of care call to [employer] patient assistance program.   I reached out to [employer] by phone today.   Review of patient status, including review of consultants reports, relevant laboratory and other test results, and collaboration with appropriate care team members and the patient's provider was performed as part of comprehensive patient evaluation and provision of chronic care management services.    SDOH (Social Determinants of Health) assessments performed: No See Care Plan activities for detailed interventions related to [facility])     Outpatient Encounter Medications as of 10/21/2019  Medication Sig Note  . alendronate (FOSAMAX) 70 MG tablet Take 1 tablet (70 mg total) by mouth once a week. Take with a full glass of water on an empty stomach. (Patient not taking: Reported on 09/19/2019)   . Ascorbic Acid (VITAMIN C) 1000 MG tablet Take 1,000 mg by mouth daily as needed.  09/20/2018: As needed to prevent illness  . atorvastatin (LIPITOR) 40 MG tablet Take 1 tablet (40 mg total) by mouth daily.   . Biotin 1000 MCG tablet Take 4,000 mcg by mouth every other day.  01/19/2018: Takes every other day  . blood glucose meter kit and supplies Dispense based on patient and insurance preference. Use up to four times daily as  directed. (FOR ICD-10 E10.9, E11.9).   Marland Kitchen Cholecalciferol (VITAMIN D3) 50 MCG (2000 UT) capsule Take 1,000 Units by mouth daily.    . diclofenac Sodium (VOLTAREN) 1 % GEL Apply 2 g topically 3 (three) times daily as needed.   . dicyclomine (BENTYL) 10 MG capsule Take 1 capsule (10 mg total) by mouth 4 (four) times daily -  before meals and at bedtime for 10 days.   . insulin degludec (TRESIBA FLEXTOUCH) 100 UNIT/ML SOPN FlexTouch Pen Inject 0.26 mLs (26 Units total) into the skin daily. (Patient taking differently: Inject 20 Units into the skin daily. )   . meloxicam (MOBIC) 15 MG tablet Take 1 tablet (15 mg total) by mouth daily as needed for pain.   . metFORMIN (GLUCOPHAGE-XR) 500 MG 24 hr tablet TAKE 3 TABLETS BY MOUTH ONCE DAILY WITH BREAKFAST   . OZEMPIC, 1 MG/DOSE, 2 MG/1.5ML SOPN INJECT 1 MG SUBCUTANEOUSLY ONCE A WEEK   . polyethylene glycol powder (GLYCOLAX/MIRALAX) 17 GM/SCOOP powder Take 17 g by mouth 2 (two) times daily as needed. (Patient not taking: Reported on 09/19/2019)   . spironolactone (ALDACTONE) 25 MG tablet Take 1 tablet by mouth once daily   . vitamin B-12 (CYANOCOBALAMIN) 1000 MCG tablet Take 1,000 mcg by mouth daily.    . vitamin E 400 UNIT capsule Take by mouth.    No facility-administered encounter medications on file as of 10/21/2019.    Goals Addressed            This Visit's Progress   .  PharmD - Medication Management       CARE PLAN ENTRY (see longitudinal plan of care for additional care plan information)  Current Barriers:  . Chronic Disease Management support, education, and care coordination needs related to HTN, HLD, and DMII  . Financial Barriers  Pharmacist Clinical Goal(s):  Marland Kitchen Over the next 30 days, patient will work with CM Pharmacist to address needs related to medication assistance and diabetes management  Interventions: . Receive a voicemail from Summer Young stating she just picked up supply of Ozempic from office as received from [employer]  patient assistance program, but that refill of [name] was not there. . Place coordination of care call to [employer] patient assistance program. Speak with representative Lytle Michaels. o Lytle Michaels states that the last refill request from office for patient's [name] was not accepted because under "Product name", provider must indicate pen strength. Representative states program unable to take verbal confirmation for this field.  o States will fax new refill request form to provider today . Collaborate with PCP to request provider again complete refill request, specifying product strength, and fax back to program . Follow up with patient to update her on Tresiba refill. Summer Young understanding and states that she has plenty of Tresiba remaining at this time.  Patient Self Care Activities:  . Self administers medications as prescribed . Making dietary changes to help control her blood sugar: o focusing on portion sizes o avoiding dietary indiscretions . Attends all scheduled provider appointments . Calls pharmacy for medication refills . Calls provider office for new concerns or questions . Checks blood sugars as directed o Keeps log in App on phone   Please see past updates related to this goal by clicking on the "Past Updates" button in the selected goal          Telephone follow up appointment with care management team member scheduled for: 10/6 at 4:30 pm  [name], PharmD, [place] [phone]

## 2019-10-25 ENCOUNTER — Telehealth: Payer: Self-pay

## 2019-10-25 ENCOUNTER — Telehealth: Payer: Self-pay | Admitting: Licensed Clinical Social Worker

## 2019-10-25 NOTE — Telephone Encounter (Signed)
Chronic Care Management    Clinical Social Work General Follow Up Note  10/25/2019 Name: Summer Young MRN: [id number] DOB: Oct 20, 1950  Summer Young is a 69 y.o. year old female who is a primary care patient of [name], Lupita Raider, FNP. The CCM team was consulted for assistance with Mental Health Counseling and Resources.   Review of patient status, including review of consultants reports, relevant laboratory and other test results, and collaboration with appropriate care team members and the patient's provider was performed as part of comprehensive patient evaluation and provision of chronic care management services.    LCSW completed CCM outreach attempt today but was unable to reach patient successfully. A HIPPA compliant voice message was left encouraging patient to return call once available. LCSW will ask Scheduling Care Guide to reschedule CCM SW appointment as well.   Outpatient Encounter Medications as of 10/25/2019  Medication Sig Note   alendronate (FOSAMAX) 70 MG tablet Take 1 tablet (70 mg total) by mouth once a week. Take with a full glass of water on an empty stomach. (Patient not taking: Reported on 09/19/2019)    Ascorbic Acid (VITAMIN C) 1000 MG tablet Take 1,000 mg by mouth daily as needed.  09/20/2018: As needed to prevent illness   atorvastatin (LIPITOR) 40 MG tablet Take 1 tablet (40 mg total) by mouth daily.    Biotin 1000 MCG tablet Take 4,000 mcg by mouth every other day.  01/19/2018: Takes every other day   blood glucose meter kit and supplies Dispense based on patient and insurance preference. Use up to four times daily as directed. (FOR ICD-10 E10.9, E11.9).    Cholecalciferol (VITAMIN D3) 50 MCG (2000 UT) capsule Take 1,000 Units by mouth daily.     diclofenac Sodium (VOLTAREN) 1 % GEL Apply 2 g topically 3 (three) times daily as needed.    dicyclomine (BENTYL) 10 MG capsule Take 1 capsule (10 mg total) by mouth 4 (four) times daily -  before meals and at bedtime for 10  days.    insulin degludec (TRESIBA FLEXTOUCH) 100 UNIT/ML SOPN FlexTouch Pen Inject 0.26 mLs (26 Units total) into the skin daily. (Patient taking differently: Inject 20 Units into the skin daily. )    meloxicam (MOBIC) 15 MG tablet Take 1 tablet (15 mg total) by mouth daily as needed for pain.    metFORMIN (GLUCOPHAGE-XR) 500 MG 24 hr tablet TAKE 3 TABLETS BY MOUTH ONCE DAILY WITH BREAKFAST    OZEMPIC, 1 MG/DOSE, 2 MG/1.5ML SOPN INJECT 1 MG SUBCUTANEOUSLY ONCE A WEEK    polyethylene glycol powder (GLYCOLAX/MIRALAX) 17 GM/SCOOP powder Take 17 g by mouth 2 (two) times daily as needed. (Patient not taking: Reported on 09/19/2019)    spironolactone (ALDACTONE) 25 MG tablet Take 1 tablet by mouth once daily    vitamin B-12 (CYANOCOBALAMIN) 1000 MCG tablet Take 1,000 mcg by mouth daily.     vitamin E 400 UNIT capsule Take by mouth.    No facility-administered encounter medications on file as of 10/25/2019.    Follow Up Plan: [facility] will reach out to patient to reschedule appointment.   Eula Fried, BSW, MSW, [name] Phone: [phone]

## 2019-10-26 ENCOUNTER — Ambulatory Visit: Payer: PPO | Admitting: Family Medicine

## 2019-10-28 ENCOUNTER — Ambulatory Visit (INDEPENDENT_AMBULATORY_CARE_PROVIDER_SITE_OTHER): Payer: PPO | Admitting: Family Medicine

## 2019-10-28 ENCOUNTER — Other Ambulatory Visit: Payer: Self-pay

## 2019-10-28 ENCOUNTER — Encounter: Payer: Self-pay | Admitting: Family Medicine

## 2019-10-28 VITALS — BP 120/70 | HR 75 | Temp 98.4°F | Resp 18 | Ht 62.0 in | Wt 164.0 lb

## 2019-10-28 DIAGNOSIS — M549 Dorsalgia, unspecified: Secondary | ICD-10-CM

## 2019-10-28 LAB — POCT URINALYSIS DIPSTICK
Bilirubin, UA: NEGATIVE
Blood, UA: NEGATIVE
Glucose, UA: NEGATIVE
Ketones, UA: NEGATIVE
Leukocytes, UA: NEGATIVE
Nitrite, UA: NEGATIVE
Protein, UA: NEGATIVE
Spec Grav, UA: 1.01 (ref 1.010–1.025)
Urobilinogen, UA: 0.2 E.U./dL
pH, UA: 5 (ref 5.0–8.0)

## 2019-10-28 NOTE — Patient Instructions (Addendum)
I have printed out a local list of providers that do massage therapy in [place], [place].  If you change your mind and would like to go to physical therapy, please let me know and I can send in a referral.  Can use topical over the counter medications such as Lidoderm patches or Salonpas patches.  If you change your mind and would like a low dose muscle relaxer, please contact the office, since we discussed this today and I can send in a prescription for this.  We will plan to see you back if your symptoms worsen or fail to improve  You will receive a survey after today's visit either digitally by e-mail or paper by USPS mail. Your experiences and feedback matter to [place].  Please respond so we know how we are doing as we provide care for you.  Call us with any questions/concerns/needs.  It is my goal to be available to you for your health concerns.  Thanks for choosing me to be a partner in your healthcare needs!  Harlin Rain, FNP-C Family Nurse Practitioner [facility] Phone: [phone]

## 2019-10-28 NOTE — Progress Notes (Signed)
Subjective:    Patient ID: Summer Young, female    DOB: 15-Dec-1950, 69 y.o.   MRN: [id number]  Summer Young is a 69 y.o. female presenting on 10/28/2019 for Back Pain (intermittent chronic back pain that worsen with movement, but improves when she is less activity. )   HPI  [name] presents to clinic for concerns of intermittent chronic lower back pain that is worsened with activity and improves when rested.  Reports typically her back feels ok on the weekends but once she returns to work on Mondays and is doing more physical work, she begins to flare her back up.  Has some symptom improvement with meloxicam and taking a hot shower.  Is interested in finding a local place for a massage.  Depression screen [facility] 2/9 06/06/2019 05/31/2019 12/23/2018  Decreased Interest 0 0 1  Down, Depressed, Hopeless 0 0 2  PHQ - 2 Score 0 0 3  Altered sleeping - - 2  Tired, decreased energy - - 1  Change in appetite - - 1  Feeling bad or failure about yourself  - - 1  Trouble concentrating - - 0  Moving slowly or fidgety/restless - - 0  Suicidal thoughts - - 0  PHQ-9 Score - - 8  Difficult doing work/chores - - Somewhat difficult    Social History   Tobacco Use  . Smoking status: Never Smoker  . Smokeless tobacco: Never Used  Vaping Use  . Vaping Use: Never used  Substance Use Topics  . Alcohol use: Yes    Alcohol/week: 0.0 - 1.0 standard drinks    Comment: occasional  . Drug use: Never    Review of Systems  Constitutional: Negative.   HENT: Negative.   Eyes: Negative.   Respiratory: Negative.   Cardiovascular: Negative.   Gastrointestinal: Negative.   Endocrine: Negative.   Genitourinary: Negative.   Musculoskeletal: Positive for back pain. Negative for arthralgias, gait problem, joint swelling, myalgias, neck pain and neck stiffness.  Skin: Negative.   Allergic/Immunologic: Negative.   Neurological: Negative.   Hematological: Negative.   Psychiatric/Behavioral: Negative.     Per HPI unless specifically indicated above     Objective:    BP 120/70 (BP Location: Left Arm, Patient Position: Sitting, Cuff Size: Normal)   Pulse 75   Temp 98.4 F (36.9 C) (Oral)   Resp 18   Ht 5\' 2"  (1.575 m)   Wt 164 lb (74.4 kg)   SpO2 100%   BMI 30.00 kg/m   Wt Readings from Last 3 Encounters:  10/28/19 164 lb (74.4 kg)  09/19/19 165 lb 3.2 oz (74.9 kg)  08/11/19 165 lb (74.8 kg)    Physical Exam Vitals reviewed.  Constitutional:      General: She is not in acute distress.    Appearance: Normal appearance. She is well-developed and well-groomed. She is obese. She is not ill-appearing or toxic-appearing.  HENT:     Head: Normocephalic and atraumatic.     Nose:     Comments: Summer Young is in place, covering mouth and nose. Eyes:     General: Lids are normal. Vision grossly intact.        Right eye: No discharge.        Left eye: No discharge.     Extraocular Movements: Extraocular movements intact.     Conjunctiva/sclera: Conjunctivae normal.     Pupils: Pupils are equal, round, and reactive to light.  Cardiovascular:     Rate and Rhythm: Normal rate and  regular rhythm.     Pulses: Normal pulses.          Dorsalis pedis pulses are 2+ on the right side and 2+ on the left side.     Heart sounds: Normal heart sounds. No murmur heard.  No friction rub. No gallop.   Pulmonary:     Effort: Pulmonary effort is normal. No respiratory distress.     Breath sounds: Normal breath sounds.  Musculoskeletal:     Cervical back: Normal.     Thoracic back: Normal.     Lumbar back: Tenderness present. No swelling or spasms. Normal range of motion. Negative right straight leg raise test and negative left straight leg raise test.     Right lower leg: No edema.     Left lower leg: No edema.  Skin:    General: Skin is warm and dry.     Capillary Refill: Capillary refill takes less than 2 seconds.  Neurological:     General: No focal deficit present.     Mental Status: She is  alert and oriented to person, place, and time.  Psychiatric:        Attention and Perception: Attention and perception normal.        Mood and Affect: Mood and affect normal.        Speech: Speech normal.        Behavior: Behavior normal. Behavior is cooperative.        Thought Content: Thought content normal.        Cognition and Memory: Cognition and memory normal.        Judgment: Judgment normal.    Results for orders placed or performed in visit on 10/28/19  POCT Urinalysis Dipstick  Result Value Ref Range   Color, UA Yellow    Clarity, UA clear    Glucose, UA Negative Negative   Bilirubin, UA negative    Ketones, UA negative    Spec Grav, UA 1.010 1.010 - 1.025   Blood, UA negative    pH, UA 5.0 5.0 - 8.0   Protein, UA Negative Negative   Urobilinogen, UA 0.2 0.2 or 1.0 E.U./dL   Nitrite, UA negative    Leukocytes, UA Negative Negative   Appearance     Odor        Assessment & Plan:   Problem List Items Addressed This Visit      Other   Acute back pain - Primary    Bilateral low back pain that has been present on/off for months.  Has been using meloxicam and warm showers to help alleviate her back pain.  Finds rest helps, but once she is doing more physical work, this exacerbates her low back pain.  Denies changes in bowel/bladder function, saddle anesthesia, foot drop, difficulty with ambulation.  Is interested in having a massage done to help with her symptoms.  Discussed physical therapy and/or muscle relaxers, declines at this time and if she decides would like to try either, will reach out and contact the office.  Plan: 1. Continue meloxicam and warm showers as needed 2. 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 Can have massage therapy to help with lower back discomfort 4. RTC, as needed      Relevant Orders   POCT Urinalysis Dipstick (Completed)      No orders of the defined types were placed in this encounter.  Follow up  plan: Return if symptoms worsen or fail to improve.  Summer Young  Summer Young, [place] Family Nurse Practitioner [facility] 10/28/2019, 12:08 PM

## 2019-10-28 NOTE — Assessment & Plan Note (Signed)
Bilateral low back pain that has been present on/off for months.  Has been using meloxicam and warm showers to help alleviate her back pain.  Finds rest helps, but once she is doing more physical work, this exacerbates her low back pain.  Denies changes in bowel/bladder function, saddle anesthesia, foot drop, difficulty with ambulation.  Is interested in having a massage done to help with her symptoms.  Discussed physical therapy and/or muscle relaxers, declines at this time and if she decides would like to try either, will reach out and contact the office.  Plan: 1. Continue meloxicam and warm showers as needed 2. 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 Can have massage therapy to help with lower back discomfort 4. RTC, as needed

## 2019-11-02 NOTE — Telephone Encounter (Signed)
Pt has been r/s  

## 2019-11-05 ENCOUNTER — Other Ambulatory Visit: Payer: Self-pay | Admitting: Family Medicine

## 2019-11-05 DIAGNOSIS — Z794 Long term (current) use of insulin: Secondary | ICD-10-CM

## 2019-11-05 NOTE — Telephone Encounter (Signed)
Requested Prescriptions  Pending Prescriptions Disp Refills  . metFORMIN (GLUCOPHAGE-XR) 500 MG 24 hr tablet [Pharmacy Med Name: metFORMIN HCl ER 500 MG Oral Tablet Extended Release 24 Hour] 270 tablet 1    Sig: TAKE 3 TABLETS BY MOUTH ONCE DAILY WITH BREAKFAST     Endocrinology:  Diabetes - Biguanides Failed - 11/05/2019  9:22 AM      Failed - eGFR in normal range and within 360 days    GFR, Est African American  Date Value Ref Range Status  06/10/2018 91 > OR = 60 mL/min/1.19m2 Final   GFR, Est Non African American  Date Value Ref Range Status  06/10/2018 79 > OR = 60 mL/min/1.57m2 Final         Passed - Cr in normal range and within 360 days    Creat  Date Value Ref Range Status  04/12/2019 0.82 0.50 - 0.99 mg/dL Final    Comment:    For patients >63 years of age, the reference limit for Creatinine is approximately 13% higher for people identified as African-American. .    Creatinine, Urine  Date Value Ref Range Status  04/12/2019 109 20 - 275 mg/dL Final         Passed - HBA1C is between 0 and 7.9 and within 180 days    Hemoglobin A1C  Date Value Ref Range Status  09/19/2019 7.3 (A) 4.0 - 5.6 % Final   Hgb A1c MFr Bld  Date Value Ref Range Status  06/10/2018 8.1 (H) <5.7 % of total Hgb Final    Comment:    For someone without known diabetes, a hemoglobin A1c value of 6.5% or greater indicates that they may have  diabetes and this should be confirmed with a follow-up  test. . For someone with known diabetes, a value <7% indicates  that their diabetes is well controlled and a value  greater than or equal to 7% indicates suboptimal  control. A1c targets should be individualized based on  duration of diabetes, age, comorbid conditions, and  other considerations. . Currently, no consensus exists regarding use of hemoglobin A1c for diagnosis of diabetes for children. Renella Cunas - Valid encounter within last 6 months    Recent Outpatient Visits           1 week ago Acute back pain, unspecified back location, unspecified back pain laterality   [facility], Lupita Raider, FNP   1 month ago Type 2 diabetes mellitus with hyperglycemia, without long-term current use of insulin [facility])   [facility], Lupita Raider, FNP   2 months ago Epigastric pain   [place], FNP   4 months ago Screening for osteoporosis   [place], FNP   6 months ago Fatigue, unspecified type   [facility], Lupita Raider, [place]

## 2019-11-23 ENCOUNTER — Ambulatory Visit: Payer: Self-pay | Admitting: Pharmacist

## 2019-11-23 DIAGNOSIS — E1165 Type 2 diabetes mellitus with hyperglycemia: Secondary | ICD-10-CM

## 2019-11-23 DIAGNOSIS — I10 Essential (primary) hypertension: Secondary | ICD-10-CM

## 2019-11-23 NOTE — Chronic Care Management (AMB) (Signed)
Chronic Care Management   Follow Up Note   11/23/2019 Name: Summer Young MRN: [id number] DOB: 18-Jul-1950  Referred by: [name], FNP Reason for referral : Chronic Care Management (Patient Phone Call)   Summer Young is a 69 y.o. year old female who is a primary care patient of [name], FNP. The CCM team was consulted for assistance with chronic disease management and care coordination needs.    I reached out to [facility] by phone today.   Review of patient status, including review of consultants reports, relevant laboratory and other test results, and collaboration with appropriate care team members and the patient's provider was performed as part of comprehensive patient evaluation and provision of chronic care management services.    SDOH (Social Determinants of Health) assessments performed: No See Care Plan activities for detailed interventions related to [facility])     Outpatient Encounter Medications as of 11/23/2019  Medication Sig Note   insulin degludec (TRESIBA FLEXTOUCH) 100 UNIT/ML SOPN FlexTouch Pen Inject 0.26 mLs (26 Units total) into the skin daily. (Patient taking differently: Inject 20 Units into the skin daily. )    meloxicam (MOBIC) 15 MG tablet Take 1 tablet (15 mg total) by mouth daily as needed for pain.    metFORMIN (GLUCOPHAGE-XR) 500 MG 24 hr tablet TAKE 3 TABLETS BY MOUTH ONCE DAILY WITH BREAKFAST    OZEMPIC, 1 MG/DOSE, 2 MG/1.5ML SOPN INJECT 1 MG SUBCUTANEOUSLY ONCE A WEEK    alendronate (FOSAMAX) 70 MG tablet Take 1 tablet (70 mg total) by mouth once a week. Take with a full glass of water on an empty stomach.    Ascorbic Acid (VITAMIN C) 1000 MG tablet Take 1,000 mg by mouth daily as needed.  09/20/2018: As needed to prevent illness   atorvastatin (LIPITOR) 40 MG tablet Take 1 tablet (40 mg total) by mouth daily.    Biotin 1000 MCG tablet Take 4,000 mcg by mouth every other day.  01/19/2018: Takes every other day   blood glucose meter kit  and supplies Dispense based on patient and insurance preference. Use up to four times daily as directed. (FOR ICD-10 E10.9, E11.9).    Cholecalciferol (VITAMIN D3) 50 MCG (2000 UT) capsule Take 1,000 Units by mouth daily.     diclofenac Sodium (VOLTAREN) 1 % GEL Apply 2 g topically 3 (three) times daily as needed.    polyethylene glycol powder (GLYCOLAX/MIRALAX) 17 GM/SCOOP powder Take 17 g by mouth 2 (two) times daily as needed.    spironolactone (ALDACTONE) 25 MG tablet Take 1 tablet by mouth once daily    vitamin B-12 (CYANOCOBALAMIN) 1000 MCG tablet Take 1,000 mcg by mouth daily.     vitamin E 400 UNIT capsule Take by mouth.    No facility-administered encounter medications on file as of 11/23/2019.    Goals Addressed            This Visit's Progress    PharmD - Medication Management       CARE PLAN ENTRY (see longitudinal plan of care for additional care plan information)  Current Barriers:   Chronic Disease Management support, education, and care coordination needs related to HTN, HLD, and DMII   Financial Barriers  Pharmacist Clinical Goal(s):   Over the next 30 days, patient will work with CM Pharmacist to address needs related to medication assistance and diabetes management  Interventions:  Follow up with patient regarding supply of Tresiba from [employer] patient assistance program o Patient confirms received [place]  refill from program  Follow up regarding back pain o Reports continuing to benefit from hot shower and using meloxicam Rx only as needed o Reports hoping to see improvement with strengthening - rejoined the gym ~2 weeks ago  Follow up regarding blood sugar control and montioring ? Reports currently taking: ? Tresiba 20 units once daily ? Metformin ER 500 mg - 3 tablets daily with breakfast ? Ozempic 1 mg once weekly ? Reports recent morning CBGs ranging 85-132  ? Denies recent s/s of low blood sugars ? Patient confirms aware of signs, how  to manage lows and carries glucose tablets with her ? Has reported continuing to generally eat regular well-balanced meals and limiting carbohydrate portion sizes  Encourage patient to obtain home upper arm BP monitor in order to check at home 1-2x/week and keep log of results  Remind patient to schedule next follow up visit with PCP  Patient Self Care Activities:   Self administers medications as prescribed  Making dietary changes to help control her blood sugar: o focusing on portion sizes o avoiding dietary indiscretions  Attends all scheduled provider appointments  Calls pharmacy for medication refills  Calls provider office for new concerns or questions  Checks blood sugars as directed o Keeps log in App on phone   Please see past updates related to this goal by clicking on the "Past Updates" button in the selected goal         Plan  Telephone follow up appointment with care management team member scheduled for: 12/20 at 4:30 pm  [name], PharmD, [place] [phone]

## 2019-11-23 NOTE — Patient Instructions (Signed)
Thank you allowing the Chronic Care Management Team to be a part of your care! It was a pleasure speaking with you today!     CCM (Chronic Care Management) Team    Noreene Larsson RN, MSN, CCM Nurse Care Coordinator  [phone]   [name] PharmD  Clinical Pharmacist  [phone]   Eula Fried LCSW Clinical Social Worker [phone]  Visit Information  Goals Addressed            This Visit's Progress   . PharmD - Medication Management       CARE PLAN ENTRY (see longitudinal plan of care for additional care plan information)  Current Barriers:  . Chronic Disease Management support, education, and care coordination needs related to HTN, HLD, and DMII  . Financial Barriers  Pharmacist Clinical Goal(s):  Marland Kitchen Over the next 30 days, patient will work with CM Pharmacist to address needs related to medication assistance and diabetes management  Interventions: . Follow up with patient regarding supply of Tresiba from [employer] patient assistance program o Patient confirms received [place] refill from program . Follow up regarding back pain o Reports continuing to benefit from hot shower and using meloxicam Rx only as needed o Reports hoping to see improvement with strengthening - rejoined the gym ~2 weeks ago . Follow up regarding blood sugar control and montioring ? Reports currently taking: ? Tresiba 20 units once daily ? Metformin ER 500 mg - 3 tablets daily with breakfast ? Ozempic 1 mg once weekly ? Reports recent morning CBGs ranging 85-132  ? Denies recent s/s of low blood sugars ? Patient confirms aware of signs, how to manage lows and carries glucose tablets with her ? Has reported continuing to generally eat regular well-balanced meals and limiting carbohydrate portion sizes . Encourage patient to obtain home upper arm BP monitor in order to check at home 1-2x/week and keep log of results . Remind patient to schedule next follow up visit with  PCP  Patient Self Care Activities:  . Self administers medications as prescribed . Making dietary changes to help control her blood sugar: o focusing on portion sizes o avoiding dietary indiscretions . Attends all scheduled provider appointments . Calls pharmacy for medication refills . Calls provider office for new concerns or questions . Checks blood sugars as directed o Keeps log in App on phone   Please see past updates related to this goal by clicking on the "Past Updates" button in the selected goal         Patient verbalizes understanding of instructions provided today.   Telephone follow up appointment with care management team member scheduled for: 12/20 at 4:30 pm  [name], PharmD, [place] [phone]

## 2019-11-29 ENCOUNTER — Ambulatory Visit (INDEPENDENT_AMBULATORY_CARE_PROVIDER_SITE_OTHER): Payer: PPO | Admitting: Licensed Clinical Social Worker

## 2019-11-29 DIAGNOSIS — F419 Anxiety disorder, unspecified: Secondary | ICD-10-CM

## 2019-11-29 DIAGNOSIS — E1165 Type 2 diabetes mellitus with hyperglycemia: Secondary | ICD-10-CM | POA: Diagnosis not present

## 2019-11-29 DIAGNOSIS — E782 Mixed hyperlipidemia: Secondary | ICD-10-CM | POA: Diagnosis not present

## 2019-11-29 DIAGNOSIS — I10 Essential (primary) hypertension: Secondary | ICD-10-CM | POA: Diagnosis not present

## 2019-11-29 NOTE — Chronic Care Management (AMB) (Signed)
Chronic Care Management    Clinical Social Work Follow Up Note  11/29/2019 Name: Summer Young MRN: [id number] DOB: 05/23/1950  Summer Young is a 69 y.o. year old female who is a primary care patient of [name], Lupita Raider, FNP. The CCM team was consulted for assistance with Mental Health Counseling and Resources.   Review of patient status, including review of consultants reports, other relevant assessments, and collaboration with appropriate care team members and the patient's provider was performed as part of comprehensive patient evaluation and provision of chronic care management services.    SDOH (Social Determinants of Health) assessments performed: Yes    Outpatient Encounter Medications as of 11/29/2019  Medication Sig Note  . alendronate (FOSAMAX) 70 MG tablet Take 1 tablet (70 mg total) by mouth once a week. Take with a full glass of water on an empty stomach.   . Ascorbic Acid (VITAMIN C) 1000 MG tablet Take 1,000 mg by mouth daily as needed.  09/20/2018: As needed to prevent illness  . atorvastatin (LIPITOR) 40 MG tablet Take 1 tablet (40 mg total) by mouth daily.   . Biotin 1000 MCG tablet Take 4,000 mcg by mouth every other day.  01/19/2018: Takes every other day  . blood glucose meter kit and supplies Dispense based on patient and insurance preference. Use up to four times daily as directed. (FOR ICD-10 E10.9, E11.9).   Marland Kitchen Cholecalciferol (VITAMIN D3) 50 MCG (2000 UT) capsule Take 1,000 Units by mouth daily.    . diclofenac Sodium (VOLTAREN) 1 % GEL Apply 2 g topically 3 (three) times daily as needed.   . insulin degludec (TRESIBA FLEXTOUCH) 100 UNIT/ML SOPN FlexTouch Pen Inject 0.26 mLs (26 Units total) into the skin daily. (Patient taking differently: Inject 20 Units into the skin daily. )   . meloxicam (MOBIC) 15 MG tablet Take 1 tablet (15 mg total) by mouth daily as needed for pain.   . metFORMIN (GLUCOPHAGE-XR) 500 MG 24 hr tablet TAKE 3 TABLETS BY MOUTH ONCE DAILY WITH  BREAKFAST   . OZEMPIC, 1 MG/DOSE, 2 MG/1.5ML SOPN INJECT 1 MG SUBCUTANEOUSLY ONCE A WEEK   . polyethylene glycol powder (GLYCOLAX/MIRALAX) 17 GM/SCOOP powder Take 17 g by mouth 2 (two) times daily as needed.   Marland Kitchen spironolactone (ALDACTONE) 25 MG tablet Take 1 tablet by mouth once daily   . vitamin B-12 (CYANOCOBALAMIN) 1000 MCG tablet Take 1,000 mcg by mouth daily.    . vitamin E 400 UNIT capsule Take by mouth.    No facility-administered encounter medications on file as of 11/29/2019.     Goals Addressed    .  SW- "I battle anxiety" (pt-stated)        Current Barriers:  . Acute Mental Health needs related to anxiety/stress . Limited social support . Mental Health Concerns  . Family and relationship dysfunction . Social Isolation . Suicidal Ideation/Homicidal Ideation: No  Clinical Social Work Goal(s):  Marland Kitchen Over the next 120 days, patient will work with SW  bi-monthly  by telephone or in person to reduce or manage symptoms related to anxiety  . Over the next 120 days, patient will work with SW to address concerns related to gaining mental health support and develop better coping skills to manage symptoms  Interventions: . Patient interviewed and appropriate assessments performed: GAD 7 and PHQ 9. Scores have improved drastically overt the past few months. Patient reported that she went on a vacation during the summer to the [place] and was able to spend  time with family which increased her ability to cope with anxiety/stress/triggers more effectively. . Patient wishes for CCM LCSW to update PCP that she is doing extremely well both mentally and physically. LCSW will sent in basket message with this update to PCP.  Marland Kitchen Patient reports that she has joined a [employer] nearby[employer]. Patient shares that she will go there several times per week to gain socialization, relaxation from massage chair and exercise. Positive reinforcement provided for this self-care implementation.   . Patient shares that she wishes to find a local church in order to gain additional positive support. She has been invited to several churches but they are too far of a drive. Patient wishes to disconnect from all negative influences in her life. LCSW provided education on healthy self-care and boundary implementation.  . Patient shares that she is effectively implementing healthy eating habits into her daily routine to increase her self-care. Positive reinforcement provided for this achievement.  Marland Kitchen LCSW provided education on relaxation techniques such as meditation, deep breathing, massage, or yoga that can activate the body's relaxation response and ease symptoms of acute anxiety. LCSW ask that when pt is struggling with difficult emotions and stress that she start this relaxation response process.  . Discussed plans with patient for ongoing care management follow up and provided patient with direct contact information for care management team . Advised patient to contact CCM program for any urgent case management needs . Assisted patient/caregiver with obtaining information about health plan benefits . A voluntary and extensive discussion about advanced care planning including explanation and discussion of advanced was undertaken with the patient. Explanation regarding healthcare proxy and living will was reviewed and packet with forms with explanation of how to fill them out was given.   . Encouraged patient to consider long term follow up therapy/counseling . Brief CBTprovided during session  . LCSW has sent a secure email with mental health resources during a previous outreach. LCSW provided brief mental health resource education during session but patient again denies wanting or needing to pursue these resources at this time but was appreciative of the resource education.   Patient Self Care Activities:  . Attends all scheduled provider appointments  Patient Coping Strengths:   . Spirituality . Hopefulness  Patient Self Care Deficits:  . Lack of socialization and strong support network  Please see past updates related to this goal by clicking on the "Past Updates" button in the selected goal      Depression screen [facility] 2/9 11/29/2019 06/06/2019 05/31/2019 12/23/2018 09/16/2018  Decreased Interest 0 0 0 1 0  Down, Depressed, Hopeless 0 0 0 2 0  PHQ - 2 Score 0 0 0 3 0  Altered sleeping - - - 2 -  Tired, decreased energy - - - 1 -  Change in appetite - - - 1 -  Feeling bad or failure about yourself  - - - 1 -  Trouble concentrating - - - 0 -  Moving slowly or fidgety/restless - - - 0 -  Suicidal thoughts - - - 0 -  PHQ-9 Score - - - 8 -  Difficult doing work/chores - - - Somewhat difficult -   10 LITTLE Things To Do When You're Feeling Too Down To Do Anything  Take a shower. Even if you plan to stay in all day long and not see a soul, take a shower. It takes the most effort to hop in to the shower but once you do, you'll feel  immediate results. It will wake you up and you'll be feeling much fresher (and cleaner too).  Brush and floss your teeth. Give your teeth a good brushing with a floss finish. It's a small task but it feels so good and you can check 'taking care of your health' off the list of things to do.  Do something small on your list. Most of [place] have some small thing we would like to get done (load of laundry, sew a button, email a friend). Doing one of these things will make you feel like you've accomplished something.  Drink water. Drinking water is easy right? It's also really beneficial for your health so keep a glass beside you all day and take sips often. It gives you energy and prevents you from boredom eating.  Do some floor exercises. The last thing you want to do is exercise but it might be just the thing you need the most. Keep it simple and do exercises that involve sitting or laying on the floor. Even the smallest of exercises  release chemicals in the brain that make you feel good. Yoga stretches or core exercises are going to make you feel good with minimal effort.  Make your bed. Making your bed takes a few minutes but it's productive and you'll feel relieved when it's done. An unmade bed is a huge visual reminder that you're having an unproductive day. Do it and consider it your housework for the day.  Put on some nice clothes. Take the sweatpants off even if you don't plan to go anywhere. Put on clothes that make you feel good. Take a look in the mirror so your brain recognizes the sweatpants have been replaced with clothes that make you look great. It's an instant confidence booster.  Wash the dishes. A pile of dirty dishes in the sink is a reflection of your mood. It's possible that if you wash up the dishes, your mood will follow suit. It's worth a try.  Cook a real meal. If you have the luxury to have a "do nothing" day, you have time to make a real meal for yourself. Make a meal that you love to eat. The process is good to get you out of the funk and the food will ensure you have more energy for tomorrow.  Write out your thoughts by hand. When you hand write, you stimulate your brain to focus on the moment that you're in so make yourself comfortable and write whatever comes into your mind. Put those thoughts out on paper so they stop spinning around in your head. Those thoughts might be the very thing holding you down.  Follow Up Plan: SW will follow up with patient by phone over the next quarter  Eula Fried, [place], MSW, [email or web address] Phone: [phone]

## 2019-12-21 ENCOUNTER — Other Ambulatory Visit: Payer: Self-pay | Admitting: Nurse Practitioner

## 2019-12-21 ENCOUNTER — Other Ambulatory Visit: Payer: Self-pay | Admitting: Family Medicine

## 2019-12-21 DIAGNOSIS — E1165 Type 2 diabetes mellitus with hyperglycemia: Secondary | ICD-10-CM

## 2019-12-21 DIAGNOSIS — E1169 Type 2 diabetes mellitus with other specified complication: Secondary | ICD-10-CM

## 2019-12-21 DIAGNOSIS — I10 Essential (primary) hypertension: Secondary | ICD-10-CM

## 2019-12-21 NOTE — Telephone Encounter (Signed)
Approved per protocol.  Requested Prescriptions  Pending Prescriptions Disp Refills  . spironolactone (ALDACTONE) 25 MG tablet [Pharmacy Med Name: Spironolactone 25 MG Oral Tablet] 90 tablet 0    Sig: Take 1 tablet by mouth once daily     Cardiovascular: Diuretics - Aldosterone Antagonist Passed - 12/21/2019  8:56 AM      Passed - Cr in normal range and within 360 days    Creat  Date Value Ref Range Status  04/12/2019 0.82 0.50 - 0.99 mg/dL Final    Comment:    For patients >42 years of age, the reference limit for Creatinine is approximately 13% higher for people identified as African-American. .    Creatinine, Urine  Date Value Ref Range Status  04/12/2019 109 20 - 275 mg/dL Final         Passed - K in normal range and within 360 days    Potassium  Date Value Ref Range Status  04/12/2019 4.8 3.5 - 5.3 mmol/L Final         Passed - Na in normal range and within 360 days    Sodium  Date Value Ref Range Status  04/12/2019 141 135 - 146 mmol/L Final         Passed - Last BP in normal range    BP Readings from Last 1 Encounters:  10/28/19 120/70         Passed - Valid encounter within last 6 months    Recent Outpatient Visits          1 month ago Acute back pain, unspecified back location, unspecified back pain laterality   [facility], Lupita Raider, FNP   3 months ago Type 2 diabetes mellitus with hyperglycemia, without long-term current use of insulin [facility])   [place], FNP   4 months ago Epigastric pain   [place], Lupita Raider, FNP   6 months ago Screening for osteoporosis   [place], FNP   7 months ago Fatigue, unspecified type   [facility], Lupita Raider, FNP      Future Appointments            In 4 weeks Malfi, Lupita Raider, [facility], [facility]

## 2019-12-21 NOTE — Telephone Encounter (Signed)
Requested Prescriptions  Pending Prescriptions Disp Refills  . atorvastatin (LIPITOR) 40 MG tablet [Pharmacy Med Name: Atorvastatin Calcium 40 MG Oral Tablet] 90 tablet 0    Sig: Take 1 tablet by mouth once daily     Cardiovascular:  Antilipid - Statins Failed - 12/21/2019  8:56 AM      Failed - HDL in normal range and within 360 days    HDL  Date Value Ref Range Status  04/12/2019 45 (L) > OR = 50 mg/dL Final         Passed - Total Cholesterol in normal range and within 360 days    Cholesterol  Date Value Ref Range Status  04/12/2019 104 <200 mg/dL Final         Passed - LDL in normal range and within 360 days    LDL Cholesterol (Calc)  Date Value Ref Range Status  04/12/2019 45 mg/dL (calc) Final    Comment:    Reference range: <100 . Desirable range <100 mg/dL for primary prevention;   <70 mg/dL for patients with CHD or diabetic patients  with > or = 2 CHD risk factors. Marland Kitchen LDL-C is now calculated using the Martin-Hopkins  calculation, which is a validated novel method providing  better accuracy than the Friedewald equation in the  estimation of LDL-C.  Cresenciano Genre et al. Annamaria Helling. 7353;299(24): 2061-2068  (http://education.[employer].com/faq/FAQ164)          Passed - Triglycerides in normal range and within 360 days    Triglycerides  Date Value Ref Range Status  04/12/2019 63 <150 mg/dL Final         Passed - Patient is not pregnant      Passed - Valid encounter within last 12 months    Recent Outpatient Visits          1 month ago Acute back pain, unspecified back location, unspecified back pain laterality   [facility], Lupita Raider, FNP   3 months ago Type 2 diabetes mellitus with hyperglycemia, without long-term current use of insulin [facility])   [facility], Lupita Raider, FNP   4 months ago Epigastric pain   [facility], Lupita Raider, FNP   6 months ago Screening for osteoporosis   [place], FNP   7 months ago Fatigue, unspecified type   [facility], Lupita Raider, FNP      Future Appointments            In 4 weeks Malfi, Lupita Raider, [facility], [facility]

## 2019-12-22 ENCOUNTER — Telehealth: Payer: Self-pay | Admitting: General Practice

## 2019-12-22 ENCOUNTER — Ambulatory Visit: Payer: Self-pay | Admitting: General Practice

## 2019-12-22 DIAGNOSIS — G8929 Other chronic pain: Secondary | ICD-10-CM

## 2019-12-22 DIAGNOSIS — E782 Mixed hyperlipidemia: Secondary | ICD-10-CM

## 2019-12-22 DIAGNOSIS — M545 Low back pain, unspecified: Secondary | ICD-10-CM

## 2019-12-22 DIAGNOSIS — E1165 Type 2 diabetes mellitus with hyperglycemia: Secondary | ICD-10-CM

## 2019-12-22 DIAGNOSIS — I10 Essential (primary) hypertension: Secondary | ICD-10-CM

## 2019-12-22 NOTE — Chronic Care Management (AMB) (Signed)
Chronic Care Management   Follow Up Note   12/22/2019 Name: Summer Young MRN: [id number] DOB: 06/10/1950  Referred by: [name], FNP Reason for referral : Chronic Care Management (RNCM Follow up for Chronic Disease Management and Care Coordination Needs )   Summer Young is a 69 y.o. year old female who is a primary care patient of [name], FNP. The CCM team was consulted for assistance with chronic disease management and care coordination needs.    Review of patient status, including review of consultants reports, relevant laboratory and other test results, and collaboration with appropriate care team members and the patient's provider was performed as part of comprehensive patient evaluation and provision of chronic care management services.    SDOH (Social Determinants of Health) assessments performed: Yes See Care Plan activities for detailed interventions related to [facility])     Outpatient Encounter Medications as of 12/22/2019  Medication Sig Note  . alendronate (FOSAMAX) 70 MG tablet Take 1 tablet (70 mg total) by mouth once a week. Take with a full glass of water on an empty stomach.   . Ascorbic Acid (VITAMIN C) 1000 MG tablet Take 1,000 mg by mouth daily as needed.  09/20/2018: As needed to prevent illness  . atorvastatin (LIPITOR) 40 MG tablet Take 1 tablet by mouth once daily   . Biotin 1000 MCG tablet Take 4,000 mcg by mouth every other day.  01/19/2018: Takes every other day  . blood glucose meter kit and supplies Dispense based on patient and insurance preference. Use up to four times daily as directed. (FOR ICD-10 E10.9, E11.9).   Marland Kitchen Cholecalciferol (VITAMIN D3) 50 MCG (2000 UT) capsule Take 1,000 Units by mouth daily.    . diclofenac Sodium (VOLTAREN) 1 % GEL Apply 2 g topically 3 (three) times daily as needed.   . insulin degludec (TRESIBA FLEXTOUCH) 100 UNIT/ML SOPN FlexTouch Pen Inject 0.26 mLs (26 Units total) into the skin daily. (Patient taking differently:  Inject 20 Units into the skin daily. )   . meloxicam (MOBIC) 15 MG tablet Take 1 tablet (15 mg total) by mouth daily as needed for pain.   . metFORMIN (GLUCOPHAGE-XR) 500 MG 24 hr tablet TAKE 3 TABLETS BY MOUTH ONCE DAILY WITH BREAKFAST   . OZEMPIC, 1 MG/DOSE, 2 MG/1.5ML SOPN INJECT 1 MG SUBCUTANEOUSLY ONCE A WEEK   . polyethylene glycol powder (GLYCOLAX/MIRALAX) 17 GM/SCOOP powder Take 17 g by mouth 2 (two) times daily as needed.   Marland Kitchen spironolactone (ALDACTONE) 25 MG tablet Take 1 tablet by mouth once daily   . vitamin B-12 (CYANOCOBALAMIN) 1000 MCG tablet Take 1,000 mcg by mouth daily.    . vitamin E 400 UNIT capsule Take by mouth.    No facility-administered encounter medications on file as of 12/22/2019.     Objective:   BP Readings from Last 3 Encounters:  10/28/19 120/70  09/19/19 124/74  08/11/19 129/62   Lab Results  Component Value Date   HGBA1C 7.3 (A) 09/19/2019   Goals Addressed              This Visit's Progress   .  RNCM: "I am doing much better now" (pt-stated)        CARE PLAN ENTRY (see longtitudinal plan of care for additional care plan information)  Current Barriers:  . Chronic Disease Management support, education, and care coordination needs related to HTN, HLD, and DMII  Clinical Goal(s) related to HTN, HLD, and DMII:  Over the next 120  days, patient will:  . Work with the care management team to address educational, disease management, and care coordination needs  . Begin or continue self health monitoring activities as directed today Measure and record cbg (blood glucose) 1 times daily and adhere to a heart healthy/ADA diet  . Call provider office for new or worsened signs and symptoms Blood pressure findings outside established parameters and New or worsened symptom related to HLD/HTN or other chronic conditions  . Call care management team with questions or concerns . Verbalize basic understanding of patient centered plan of care established  today  Interventions related to HTN, HLD, and DMII:  . Evaluation of current treatment plans and patient's adherence to plan as established by provider.  The patient is doing well with current plan of care related to HTN, HLD, DMII.  Marland Kitchen Assessed patient understanding of disease states.  The patient has a good understanding of chronic conditions. Says her  blood sugars are much better now and says this am her glucose was 100. Range is usually 90 to 120.  Is hopeful that her next hemoglobin A1C will be WNL.  Recently had to get another glucose meter, she has the one touch now. 10-20-2019: The patient is pleased that her blood glucose readings are much better. Her range is 70 to 128. Yesterday morning her blood sugar was 98.  Her hemoglobin A1C was 7.3 on 09-19-2019, down from 7.8 in February of 2021.  Praised for accomplishments. The patient verbalized if her A1C is at goal at the next MD appointment there may be adjustments in her medications regimen. She endorses compliance with her medications. 12-22-2019: The patient is doing well. Has only had a couple of highs and that was when she was having back pain more than usual. She has an appointment 01-18-2020 for evaluation of hemoglobin A1C and follow up.   . Assessed patient's education and care coordination needs.  The patient saw the pcp on 08/11/2019 for some issues with abdominal discomfort and also DM meter. The patient was started on Bentyl and this is helping. 10-20-2019: Abdominal pain has resolved. Is having new increasing pain with back pain (see additional care plan).  The patient educated on hypoglycemia and making sure she is eating high protein meals so she will not have any hypoglycemic events. The patient denies any episodes of hypoglycemia but knows what to look for in the event of blood sugars dropping.  . Provided disease specific education to patient.  Education on maintaining weight and monitoring nutritional intake. The patient realizes that she has  to stay away from spicy foods. Is feeling much better this week.  12-22-2019: States her appetite is good and she has not issues with nausea and vomiting. Endorses compliance with a heart healthy/ADA diet. The patient is working  hard and feels good other than the chronic back pain she is dealing with. She is excited about her upcoming vacation to [place] and then her and her husband will come back and spend Thanksgiving with family.  Nash Dimmer with appropriate clinical care team members regarding patient needs.  The patient is currently working with CCM team pharmacist and LCSW.   Patient Self Care Activities related to HTN, HLD, and DMII:  . Patient is unable to independently self-manage chronic health conditions  Please see past updates related to this goal by clicking on the "Past Updates" button in the selected goal      .  RNCM: Pt-"i am having more back pain  than usual" (pt-stated)        CARE PLAN ENTRY (see longitudinal plan of care for additional care plan information)  Current Barriers:  Marland Kitchen Knowledge Deficits related to unresolved back pain and best treatment for chronic back pain and discomfort . Care Coordination needs related to education/support and treatment  in a patient with chronic back pain with increased exacerbation of pain and discomfort.  . Chronic Disease Management support and education needs related to Chronic back pain and discomfort  Nurse Case Manager Clinical Goal(s):  Marland Kitchen Over the next 120 days, patient will verbalize understanding of plan for treatment and support of chronic back pain and discomfort  . Over the next 120 days, patient will work with [facility] [place] team and pcp to address needs related to increased back pain and best treatment options . Over the next 120 days, patient will demonstrate a decrease in back pain exacerbations as evidenced by evaluation and assessment by pcp and a plan of care to help reduce back pain and discomfort.  . Over  the next 120 days, patient will attend all scheduled medical appointments: patient to call the office to secure an appointment with pcp for follow up and evaluation . Over the next 120 days, patient will demonstrate improved health management independence as evidenced byreduced/resolved pain and discomfort and ability to perform ADLS/IADLS without difficulty . Over the next 120 days, patient will demonstrate understanding of rationale for each prescribed medication as evidenced by compliance  . Over the next 120 days, patient will work with CM team pharmacist to review current medication regimen and offer recommendations as warranted.   Interventions:  . Inter-disciplinary care team collaboration (see longitudinal plan of care) . Evaluation of current treatment plan related to back pain and discomfort and patient's adherence to plan as established by provider. 12-22-2019: The patient states that her back pain is much better. She had a couple of days that is was bothering her but for the most part it is much better.  She is thankful for this.  . Advised patient to call the provider to get an appointment, write down questions to ask the provider at appointment, and to call for worsening sx/sx of pain or discomfort.  12-22-2019: The patient knows to call for changes in her condition. Has a follow up appointment with pcp on 01-18-2020.  Marland Kitchen Provided education to patient re: talking to pcp about alternate therapies and if the use of a back brace would benefit due to the type of work she does. 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 She has been having to do extra. She realizes that her back hurts more when she is working extra. Education on pacing activity. 12-22-2019: The patient states she is still doing the same amount of activity but she is pacing her activity and doing well. She knows her limitations.  . Reviewed medications with patient and  discussed compliance.  The patient is taking meloxicam at night and this helps a lot. She is also using the gel but can not tell that it really helps her. Is open to recommendations.  . Discussed plans with patient for ongoing care management follow up and provided patient with direct contact information for care management team . Provided patient with back pain and safety educational materials related to chronic back pain and discomfort . Reviewed scheduled/upcoming provider appointments including: the patient to call to get an appointment to see  pcp for evaluation and treatment options. 12-22-2019: Next appointment with the pcp is 01-18-2020.  Marland Kitchen Pharmacy referral for review of medications and recommendations on how to  help with back pain and discomfort by pharmacological means.   Patient Self Care Activities:  . Patient verbalizes understanding of plan to work with the pcp, RNCM, and CCM team to optimize plan of care for management of chronic back pain and discomfort.  . Performs ADL's independently . Performs IADL's independently . Calls provider office for new concerns or questions . Unable to independently manage chronic back pain as evidence of exacerbation  Please see past updates related to this goal by clicking on the "Past Updates" button in the selected goal          Plan:   Telephone follow up appointment with care management team member scheduled for: 02-16-2020 at 0900 am   [place], MSN, [place] Mobile: [phone]

## 2019-12-22 NOTE — Patient Instructions (Signed)
Visit Information  Goals Addressed              This Visit's Progress   .  RNCM: "I am doing much better now" (pt-stated)        CARE PLAN ENTRY (see longtitudinal plan of care for additional care plan information)  Current Barriers:  . Chronic Disease Management support, education, and care coordination needs related to HTN, HLD, and DMII  Clinical Goal(s) related to HTN, HLD, and DMII:  Over the next 120 days, patient will:  . Work with the care management team to address educational, disease management, and care coordination needs  . Begin or continue self health monitoring activities as directed today Measure and record cbg (blood glucose) 1 times daily and adhere to a heart healthy/ADA diet  . Call provider office for new or worsened signs and symptoms Blood pressure findings outside established parameters and New or worsened symptom related to HLD/HTN or other chronic conditions  . Call care management team with questions or concerns . Verbalize basic understanding of patient centered plan of care established today  Interventions related to HTN, HLD, and DMII:  . Evaluation of current treatment plans and patient's adherence to plan as established by provider.  The patient is doing well with current plan of care related to HTN, HLD, DMII.  Marland Kitchen Assessed patient understanding of disease states.  The patient has a good understanding of chronic conditions. Says her  blood sugars are much better now and says this am her glucose was 100. Range is usually 90 to 120.  Is hopeful that her next hemoglobin A1C will be WNL.  Recently had to get another glucose meter, she has the one touch now. 10-20-2019: The patient is pleased that her blood glucose readings are much better. Her range is 70 to 128. Yesterday morning her blood sugar was 98.  Her hemoglobin A1C was 7.3 on 09-19-2019, down from 7.8 in February of 2021.  Praised for accomplishments. The patient verbalized if her A1C is at goal at the next  MD appointment there may be adjustments in her medications regimen. She endorses compliance with her medications. 12-22-2019: The patient is doing well. Has only had a couple of highs and that was when she was having back pain more than usual. She has an appointment 01-18-2020 for evaluation of hemoglobin A1C and follow up.   . Assessed patient's education and care coordination needs.  The patient saw the pcp on 08/11/2019 for some issues with abdominal discomfort and also DM meter. The patient was started on Bentyl and this is helping. 10-20-2019: Abdominal pain has resolved. Is having new increasing pain with back pain (see additional care plan).  The patient educated on hypoglycemia and making sure she is eating high protein meals so she will not have any hypoglycemic events. The patient denies any episodes of hypoglycemia but knows what to look for in the event of blood sugars dropping.  . Provided disease specific education to patient.  Education on maintaining weight and monitoring nutritional intake. The patient realizes that she has to stay away from spicy foods. Is feeling much better this week.  12-22-2019: States her appetite is good and she has not issues with nausea and vomiting. Endorses compliance with a heart healthy/ADA diet. The patient is working  hard and feels good other than the chronic back pain she is dealing with. She is excited about her upcoming vacation to [place] and then her and her husband will come back  and spend Thanksgiving with family.  Nash Dimmer with appropriate clinical care team members regarding patient needs.  The patient is currently working with CCM team pharmacist and LCSW.   Patient Self Care Activities related to HTN, HLD, and DMII:  . Patient is unable to independently self-manage chronic health conditions  Please see past updates related to this goal by clicking on the "Past Updates" button in the selected goal      .  RNCM: Pt-"i am having more  back pain than usual" (pt-stated)        [place] (see longitudinal plan of care for additional care plan information)  Current Barriers:  Marland Kitchen Knowledge Deficits related to unresolved back pain and best treatment for chronic back pain and discomfort . Care Coordination needs related to education/support and treatment  in a patient with chronic back pain with increased exacerbation of pain and discomfort.  . Chronic Disease Management support and education needs related to Chronic back pain and discomfort  Nurse Case Manager Clinical Goal(s):  Marland Kitchen Over the next 120 days, patient will verbalize understanding of plan for treatment and support of chronic back pain and discomfort  . Over the next 120 days, patient will work with [facility] [place] team and pcp to address needs related to increased back pain and best treatment options . Over the next 120 days, patient will demonstrate a decrease in back pain exacerbations as evidenced by evaluation and assessment by pcp and a plan of care to help reduce back pain and discomfort.  . Over the next 120 days, patient will attend all scheduled medical appointments: patient to call the office to secure an appointment with pcp for follow up and evaluation . Over the next 120 days, patient will demonstrate improved health management independence as evidenced byreduced/resolved pain and discomfort and ability to perform ADLS/IADLS without difficulty . Over the next 120 days, patient will demonstrate understanding of rationale for each prescribed medication as evidenced by compliance  . Over the next 120 days, patient will work with CM team pharmacist to review current medication regimen and offer recommendations as warranted.   Interventions:  . Inter-disciplinary care team collaboration (see longitudinal plan of care) . Evaluation of current treatment plan related to back pain and discomfort and patient's adherence to plan as established by provider. 12-22-2019: The  patient states that her back pain is much better. She had a couple of days that is was bothering her but for the most part it is much better.  She is thankful for this.  . Advised patient to call the provider to get an appointment, write down questions to ask the provider at appointment, and to call for worsening sx/sx of pain or discomfort.  12-22-2019: The patient knows to call for changes in her condition. Has a follow up appointment with pcp on 01-18-2020.  Marland Kitchen Provided education to patient re: talking to pcp about alternate therapies and if the use of a back brace would benefit due to the type of work she does. 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 She has been having to do extra. She realizes that her back hurts more when she is working extra. Education on pacing activity. 12-22-2019: The patient states she is still doing the same amount of activity but she is pacing her activity and doing well. She knows her limitations.  . Reviewed medications with patient and discussed compliance.  The  patient is taking meloxicam at night and this helps a lot. She is also using the gel but can not tell that it really helps her. Is open to recommendations.  . Discussed plans with patient for ongoing care management follow up and provided patient with direct contact information for care management team . Provided patient with back pain and safety educational materials related to chronic back pain and discomfort . Reviewed scheduled/upcoming provider appointments including: the patient to call to get an appointment to see pcp for evaluation and treatment options. 12-22-2019: Next appointment with the pcp is 01-18-2020.  Marland Kitchen Pharmacy referral for review of medications and recommendations on how to  help with back pain and discomfort by pharmacological means.   Patient Self Care Activities:  . Patient verbalizes understanding of plan to work with the pcp,  RNCM, and CCM team to optimize plan of care for management of chronic back pain and discomfort.  . Performs ADL's independently . Performs IADL's independently . Calls provider office for new concerns or questions . Unable to independently manage chronic back pain as evidence of exacerbation  Please see past updates related to this goal by clicking on the "Past Updates" button in the selected goal         Patient verbalizes understanding of instructions provided today.   Telephone follow up appointment with care management team member scheduled for: 02-16-2020 at 0900 am  Noreene Larsson RN, MSN, [place] Mobile: [phone]

## 2020-01-18 ENCOUNTER — Encounter: Payer: Self-pay | Admitting: Family Medicine

## 2020-01-18 ENCOUNTER — Ambulatory Visit (INDEPENDENT_AMBULATORY_CARE_PROVIDER_SITE_OTHER): Payer: PPO | Admitting: Family Medicine

## 2020-01-18 ENCOUNTER — Other Ambulatory Visit: Payer: Self-pay

## 2020-01-18 VITALS — BP 137/70 | HR 92 | Temp 99.6°F | Resp 17 | Ht 62.0 in | Wt 159.8 lb

## 2020-01-18 DIAGNOSIS — E1165 Type 2 diabetes mellitus with hyperglycemia: Secondary | ICD-10-CM | POA: Diagnosis not present

## 2020-01-18 DIAGNOSIS — R059 Cough, unspecified: Secondary | ICD-10-CM | POA: Diagnosis not present

## 2020-01-18 DIAGNOSIS — R509 Fever, unspecified: Secondary | ICD-10-CM | POA: Insufficient documentation

## 2020-01-18 LAB — POCT GLYCOSYLATED HEMOGLOBIN (HGB A1C): Hemoglobin A1C: 7.4 % — AB (ref 4.0–5.6)

## 2020-01-18 NOTE — Assessment & Plan Note (Signed)
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 - Complications - hyperglycemia and cataracts  Plan:  1. Continue current therapy: tresiba 20 units daily, metformin XR 500mg  - 3 tablets in AM WC, and ozempic 1mg  subcutaneously weekly 2. Encourage improved lifestyle: - low carb/low glycemic diet reinforced prior education - Increase physical activity to 30 minutes most days of the week.  Explained that increased physical activity increases body's use of sugar for energy. 3. Check fasting am CBG and log these.  Bring log to next visit for review 4. Continue Statin 5. Advised to schedule DM ophtho exam, send record. 6. Follow-up 3 months

## 2020-01-18 NOTE — Patient Instructions (Signed)
To continue medications as directed  We will contact once we receive the over-read results from the radiology department  We have sent your swab to the lab for COVID testing and will contact when we receive the results.  Should quarantine until you receive your results  Can continue acetaminophen and tussin for symptoms, according to packaging directions.  If you begin to have worsening shortness of breath, chest pain, fever over 104 that is not responsive to ibuprofen and/or acetaminophen, or impending sense of doom to [place]!  We will plan to see you back in 3 months for diabetes follow up visit  You will receive a survey after today's visit either digitally by e-mail or paper by [place] mail. Your experiences and feedback matter to [place].  Please respond so we know how we are doing as we provide care for you.  Call us with any questions/concerns/needs.  It is my goal to be available to you for your health concerns.  Thanks for choosing me to be a partner in your healthcare needs!  Harlin Rain, FNP-C Family Nurse Practitioner [facility] Phone: [phone]

## 2020-01-18 NOTE — Progress Notes (Signed)
Subjective:    Patient ID: Summer Young, female    DOB: 1950/02/22, 69 y.o.   MRN: [id number]  Summer Young is a 69 y.o. female presenting on 01/18/2020 for Diabetes and Fatigue (coughing, severe headache, bodyaches x 3 days. Pt currently taking tussin every 3hrs and Tylenol 500MG  QID. Pt recently just travel out of the state. She was also around someone who had a cold, but seems to think it stress related. )   HPI  Summer Young presents to clinic with concerns for fatigue, cough, fever, severe headache and body aches x 3 days.  Unsure highest fever, in clinic 99.21F with acetaminophen taken within the last 2 hours.  Reports she has recently traveled out of state and has been exposed to sick contacts.  Has been taking tussin every 3 hours and tylenol 500mg  every 6 hours as needed for symptoms.  Has recently had a COVID test in the [place] on 24/58/0998, which was negative, prior to her symptom onset.  Diabetes Pt presents today for follow up Type 2 Diabetes Mellitus.  He/she (caps): She ACTION; IS/IS NOT: is checking AM CBG at home. -Current diabetic medications include: tresiba 20 units daily, metformin XR 500mg  3 tablets with breakfast, and ozempic 1mg  subcutaneously once per week -ACTION; IS/IS NOT: is not currently symptomatic -Actions; denies/reports/admits to: denies polydipsia, polyphagia, polyuria, diaphoresis, shakiness, chills, pain, numbness or tingling in extremities or changes in vision -Clinical course has been stable -Reports no exercise routine -Diet is high in salt, high in fat, and high in carbohydrates  PREVENTION Eye exam current (within 1 year) Due, encouraged Foot exam current (within 1 year) Up to date Lipid/ASCVD risk reduction - on statin: YES/NO: Yes  Kidney Protection (On ACE/ARB)? YES/NO: No   Depression screen [facility] 2/9 11/29/2019 06/06/2019 05/31/2019  Decreased Interest 0 0 0  Down, Depressed, Hopeless 0 0 0  PHQ - 2 Score 0 0 0  Altered sleeping - -  -  Tired, decreased energy - - -  Change in appetite - - -  Feeling bad or failure about yourself  - - -  Trouble concentrating - - -  Moving slowly or fidgety/restless - - -  Suicidal thoughts - - -  PHQ-9 Score - - -  Difficult doing work/chores - - -    Social History   Tobacco Use  . Smoking status: Never Smoker  . Smokeless tobacco: Never Used  Vaping Use  . Vaping Use: Never used  Substance Use Topics  . Alcohol use: Yes    Alcohol/week: 0.0 - 1.0 standard drinks    Comment: occasional  . Drug use: Never    Review of Systems  Constitutional: Positive for chills, fatigue and fever. Negative for activity change, appetite change, diaphoresis and unexpected weight change.       Body aches  HENT: Negative.   Eyes: Negative.   Respiratory: Positive for cough. Negative for apnea, choking, chest tightness, shortness of breath, wheezing and stridor.   Cardiovascular: Negative.   Gastrointestinal: Negative.   Endocrine: Negative.   Genitourinary: Negative.   Musculoskeletal: Negative.   Skin: Negative.   Allergic/Immunologic: Negative.   Neurological: Positive for headaches. Negative for dizziness, tremors, seizures, syncope, facial asymmetry, speech difficulty, weakness, light-headedness and numbness.  Hematological: Negative.   Psychiatric/Behavioral: Negative.    Per HPI unless specifically indicated above     Objective:    BP 137/70 (BP Location: Right Arm, Patient Position: Sitting, Cuff Size: Normal)   Pulse 92  Temp 99.6 F (37.6 C) (Oral)   Resp 17   Ht 5\' 2"  (1.575 m)   Wt 159 lb 12.8 oz (72.5 kg)   SpO2 100%   BMI 29.23 kg/m   Wt Readings from Last 3 Encounters:  01/18/20 159 lb 12.8 oz (72.5 kg)  10/28/19 164 lb (74.4 kg)  09/19/19 165 lb 3.2 oz (74.9 kg)    Physical Exam Vitals and nursing note reviewed.  Constitutional:      General: She is not in acute distress.    Appearance: Normal appearance. She is well-developed, well-groomed and  overweight. She is ill-appearing. She is not toxic-appearing.  HENT:     Head: Normocephalic and atraumatic.     Nose:     Comments: [name] is in place, covering mouth and nose. Eyes:     General: Lids are normal. Vision grossly intact.        Right eye: No discharge.        Left eye: No discharge.     Extraocular Movements: Extraocular movements intact.     Conjunctiva/sclera: Conjunctivae normal.     Pupils: Pupils are equal, round, and reactive to light.  Cardiovascular:     Rate and Rhythm: Normal rate and regular rhythm.     Pulses: Normal pulses.     Heart sounds: Normal heart sounds. No murmur heard.  No friction rub. No gallop.   Pulmonary:     Effort: Pulmonary effort is normal. No respiratory distress.     Breath sounds: Normal breath sounds.  Lymphadenopathy:     Cervical: No cervical adenopathy.  Skin:    General: Skin is warm and dry.     Capillary Refill: Capillary refill takes less than 2 seconds.  Neurological:     General: No focal deficit present.     Mental Status: She is alert and oriented to person, place, and time.     Cranial Nerves: No cranial nerve deficit.     Coordination: Coordination is intact.     Gait: Gait is intact.  Psychiatric:        Attention and Perception: Attention and perception normal.        Mood and Affect: Mood and affect normal.        Speech: Speech normal.        Behavior: Behavior normal. Behavior is cooperative.        Thought Content: Thought content normal.        Cognition and Memory: Cognition and memory normal.        Judgment: Judgment normal.    Results for orders placed or performed in visit on 01/18/20  POCT glycosylated hemoglobin (Hb A1C)  Result Value Ref Range   Hemoglobin A1C 7.4 (A) 4.0 - 5.6 %   HbA1c POC (<> result, manual entry)     HbA1c, POC (prediabetic range)     HbA1c, POC (controlled diabetic range)        Assessment & Plan:   Problem List Items Addressed This Visit      Endocrine    Diabetes mellitus ([place]) - Primary    ControlledDM with A1c 7.4% Worsening control from 7.3% on 09/19/2019 and goal A1c < 7.0%. - Complications - hyperglycemia and cataracts  Plan:  1. Continue current therapy: tresiba 20 units daily, metformin XR 500mg  - 3 tablets in AM WC, and ozempic 1mg  subcutaneously weekly 2. Encourage improved lifestyle: - low carb/low glycemic diet reinforced prior education - Increase physical activity to 30 minutes most days  of the week.  Explained that increased physical activity increases body's use of sugar for energy. 3. Check fasting am CBG and log these.  Bring log to next visit for review 4. Continue Statin 5. Advised to schedule DM ophtho exam, send record. 6. Follow-up 3 months      Relevant Orders   POCT glycosylated hemoglobin (Hb A1C) (Completed)     Other   Cough    See fever AP      Relevant Orders   Novel Coronavirus, NAA ([facility])   Fever    Likely viral infection based on symptoms, recent exposure to sick contacts, and travel to both [place] and [place].  Declined influenza swab.  Will have COVID swab taken in clinic today.  Can continue acetaminophen and tussin as needed for symptom management.  Encouraged quarantine and to continue to increase fluid intake.      Relevant Orders   Novel Coronavirus, NAA ([facility])      No orders of the defined types were placed in this encounter.  Follow up plan: Return in about 3 months (around 04/17/2020) for T2DM F/U A1C.   Harlin Rain, [facility] Nurse Practitioner [facility] 01/18/2020, 12:56 PM

## 2020-01-18 NOTE — Assessment & Plan Note (Signed)
See fever AP

## 2020-01-18 NOTE — Assessment & Plan Note (Addendum)
Likely viral infection based on symptoms, recent exposure to sick contacts, and travel to both [place] and [place].  Declined influenza swab.  Will have COVID swab taken in clinic today.  Can continue acetaminophen and tussin as needed for symptom management.  Encouraged quarantine and to continue to increase fluid intake.

## 2020-01-19 ENCOUNTER — Encounter: Payer: Self-pay | Admitting: Family Medicine

## 2020-01-20 LAB — SPECIMEN STATUS REPORT

## 2020-01-20 LAB — SARS-COV-2, NAA 2 DAY TAT

## 2020-01-20 LAB — NOVEL CORONAVIRUS, NAA: SARS-CoV-2, NAA: NOT DETECTED

## 2020-01-23 ENCOUNTER — Ambulatory Visit: Payer: PPO | Admitting: Pharmacist

## 2020-01-23 DIAGNOSIS — E1165 Type 2 diabetes mellitus with hyperglycemia: Secondary | ICD-10-CM

## 2020-01-23 NOTE — Chronic Care Management (AMB) (Signed)
Chronic Care Management   Pharmacy Note  01/23/2020 Name: Summer Young MRN: [id number] DOB: Jul 22, 1950   Subjective:  Summer Young is a 69 y.o. year old female who is a primary care patient of [name], Lupita Raider, FNP. The CCM team was consulted for assistance with chronic disease management and care coordination needs.    Engaged with patient by telephone for follow up visit in response to provider referral for pharmacy case management and/or care coordination services.   SDOH (Social Determinants of Health) assessments and interventions performed:    Objective:  Lab Results  Component Value Date   CREATININE 0.82 04/12/2019   CREATININE 0.79 06/10/2018   CREATININE 0.87 03/03/2018    Lab Results  Component Value Date   HGBA1C 7.4 (A) 01/18/2020       Component Value Date/Time   CHOL 104 04/12/2019 0818   TRIG 63 04/12/2019 0818   HDL 45 (L) 04/12/2019 0818   CHOLHDL 2.3 04/12/2019 0818   LDLCALC 45 04/12/2019 0818    Clinical ASCVD: No  The ASCVD Risk score (Goff DC Jr., et al., 2013) failed to calculate for the following reasons:   The valid total cholesterol range is 130 to 320 mg/dL     BP Readings from Last 3 Encounters:  01/18/20 137/70  10/28/19 120/70  09/19/19 124/74    Assessment/Interventions: Review of patient past medical history, allergies, medications, health status, including review of consultants reports, laboratory and other test data, was performed as part of comprehensive evaluation and provision of chronic care management services.   Allergies  Allergen Reactions  . Aspirin Hives    Other reaction(s): Other (See Comments) Other Reaction: Not Assessed   . Insulin Detemir Hives and Rash    Errythema, edema, heat at site of injection did not improve after 2 weeks of use.    . Shellfish Allergy Anaphylaxis  . Dapagliflozin Other (See Comments)    Yeast infections Yeast infection Farxiga  . Influenza Vaccines Other (See Comments)     Forxiga  . Other     Other reaction(s): Other (See Comments) Uncoded Allergy. Allergen: Shellfish, Other Reaction: Not Assessed    Flu vaccine given 1998 caused anaphylaxis    Medications Reviewed Today    Reviewed by Vella Raring, [place] (Pharmacist) on 01/23/20 at 1056  Med List Status: <None>  Medication Order Taking? Sig Documenting Provider Last Dose Status Informant  alendronate (FOSAMAX) 70 MG tablet [id number]  Take 1 tablet (70 mg total) by mouth once a week. Take with a full glass of water on an empty stomach. [name], FNP  Active   Ascorbic Acid (VITAMIN C) 1000 MG tablet [id number]  Take 1,000 mg by mouth daily as needed.  [provider]  Active            Med Note Winfield Cunas, [facility]   Mon Sep 20, 2018  4:05 PM) As needed to prevent illness  atorvastatin (LIPITOR) 40 MG tablet [id number] Yes Take 1 tablet by mouth once daily [name], FNP Taking Active   Biotin 1000 MCG tablet [id number]  Take 4,000 mcg by mouth every other day.  [provider]  Active Self           Med Note [facility] Ellison   Tue Jan 19, 2018 11:31 AM) [name] every other day  blood glucose meter kit and supplies [id number]  Dispense based on patient and insurance preference. Use up to four times daily as directed. (FOR ICD-10 E10.9,  E11.9). [name], FNP  Active   Cholecalciferol (VITAMIN D3) 50 MCG (2000 UT) capsule [id number]  Take 1,000 Units by mouth daily.  [provider]  Active   diclofenac Sodium (VOLTAREN) 1 % GEL [id number]  Apply 2 g topically 3 (three) times daily as needed. Karamalegos, Devonne Doughty, DO  Active   insulin degludec (TRESIBA FLEXTOUCH) 100 UNIT/ML SOPN FlexTouch Pen [id number] Yes Inject 0.26 mLs (26 Units total) into the skin daily.  Patient taking differently: Inject 20 Units into the skin daily.    [name], NP Taking Active   meloxicam (MOBIC) 15 MG tablet [id number]  Take 1 tablet (15 mg total) by mouth daily as needed for  pain. Olin Hauser, DO  Active   metFORMIN (GLUCOPHAGE-XR) 500 MG 24 hr tablet [id number] Yes TAKE 3 TABLETS BY MOUTH ONCE DAILY WITH BREAKFAST Malfi, Lupita Raider, FNP Taking Active   OZEMPIC, 1 MG/DOSE, 2 MG/1.5ML SOPN [id number] Yes INJECT 1 MG SUBCUTANEOUSLY ONCE A WEEK Malfi, Lupita Raider, FNP Taking Active   polyethylene glycol powder (GLYCOLAX/MIRALAX) 17 GM/SCOOP powder [id number]  Take 17 g by mouth 2 (two) times daily as needed. [name], FNP  Active   spironolactone (ALDACTONE) 25 MG tablet [id number]  Take 1 tablet by mouth once daily Malfi, Lupita Raider, FNP  Active   vitamin B-12 (CYANOCOBALAMIN) 1000 MCG tablet [id number]  Take 1,000 mcg by mouth daily.  [provider]  Active   vitamin E 400 UNIT capsule [id number]  Take by mouth. [provider]  Active           Patient Active Problem List   Diagnosis Date Noted  . Cough 01/18/2020  . Fever 01/18/2020  . Acute back pain 10/28/2019  . Immunization due 09/19/2019  . Osteoporosis 08/16/2019  . Screening for osteoporosis 06/16/2019  . Screening for breast cancer 06/16/2019  . Fatigue 05/03/2019  . 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 Allergic rhinitis 02/26/2018  . Hyperlipidemia 02/26/2018  . Diabetes mellitus ([place]) 02/26/2018  . Abdominal pain 02/26/2018  . Cataract cortical, senile 01/18/2018  . Seasonal allergic rhinitis due to pollen 07/24/2016  . Chronic bilateral low back pain without sciatica 11/22/2014  . Chronic pain of both knees 11/22/2014  . Essential hypertension 11/22/2014  . Dyslipidemia associated with type 2 diabetes mellitus ([place]) 11/22/2014  . Type 2 diabetes mellitus with hyperglycemia ([place]) 01/19/2013    Medication Assistance: [name] and Ozempic obtained through [employer] medication assistance program.  Renewal date pending.   Patient Care Plan: General Pharmacy (Adult)    Problem Identified: Disease Progression       Long-Range Goal: Disease Progression Prevented or Minimized   Start Date: 01/23/2020  Expected End Date: 04/22/2020  This Visit's Progress: On track  Priority: High  Note:   Current Barriers:  . Unable to independently afford treatment regimen o Patient approved for [place] and Ozempic medication assistance from [employer] through 01/17/2020 o Need for re-enrollment in medication assistance program for 2022 calendar year  Pharmacist Clinical Goal(s):  Marland Kitchen Over the next 90 days, patient will achieve control of T2DM as evidenced by A1C <7.0%. through collaboration with PharmD and provider.  . Over the next 90 days, patient will maintain control of LDL as evidenced by LDL <70 mg/dL through collaboration with PharmD and provider.  . Over the next 90 days, patient will verbalize ability to afford treatment regimen. through collaboration with PharmD and provider.  Interventions: . Inter-disciplinary  care team collaboration (see longitudinal plan of care) . Comprehensive medication review performed; medication list updated in electronic medical record . Perform chart review. Note patient seen by PCP on 12/1 for T2DM follow up as well as fatigue/cough/fever o Note COVID-19 test negative . Follow up regarding fatigue/cough/fever. Patient confirms she is resting and staying hydrated. o Patient to follow up with office for worsening or not improving symptoms . Provide empathic listening as patient describes feeling stressed with work and home life.  o States misses going to church in person, but not comfortable going right now because of COVID-19 virus prevention.  o Reports that she is feeling a little depressed/anxious. Denies SI/HI - Denies interest in therapy/counseling at this time.  o Note patient working with CCM Education officer, museum o Will collaborate with CCM Social Worker Diabetes: . Current treatment: o Tresiba 20 units once daily o Metformin ER 500 mg - 3 tablets daily with  breakfast o Ozempic 1 mg once weekly . Current glucose readings: fasting glucose ranging: 76-140 o Denies hypoglycemic symptoms o Patient confirms aware of signs, how to manage lows and carries glucose tablets with her . Counsel on importance of regular and well-balanced meals, while limiting carbohydrate portion sizes . Encourage patient to continue to monitor home blood sugar, keep log and bring with her to appointment.  . Patient confirms has sufficient supply of [place] and Ozempic to last through end of calendar year . Will collaborate with [place] for assistance with re-enrollment in [employer] patient assistance program for 2022 calendar year . Patient to call clinic for any new/worsening medical concerns  Hyperlipidemia: . Controlled; current treatment: atorvastatin 40 mg daily  . Have counseled patient on importance of medication adherence . Have discussed dietary management  Patient Goals/Self-Care Activities . Over the next 90 days, patient will:  o Take medications as prescribed o Check blood glucose daily, document, and provide at future appointments - Keeps log in App on phone o Collaborate with provider on medication access solutions o Attends all scheduled provider appointments o Calls pharmacy for medication refills o Calls provider office for new concerns or questions Follow Up Plan: Telephone follow up appointment with care management team member scheduled for: 1/3 at 8:45 am    [name], PharmD, [place] [phone]

## 2020-01-23 NOTE — Patient Instructions (Signed)
Thank you allowing the Chronic Care Management Team to be a part of your care! It was a pleasure speaking with you today!     CCM (Chronic Care Management) Team    Noreene Larsson RN, MSN, CCM Nurse Care Coordinator  [phone]   [name] PharmD  Clinical Pharmacist  [phone]   Eula Fried LCSW Clinical Social Worker [phone]  Visit Information  Patient Care Plan: General Pharmacy (Adult)         Problem Identified: Disease Progression     Long-Range Goal: Disease Progression Prevented or Minimized   Start Date: 01/23/2020  Expected End Date: 04/22/2020  This Visit's Progress: On track  Priority: High  Note:   Current Barriers:   Unable to independently afford treatment regimen ? Patient approved for [place] and Ozempic medication assistance from [employer] through 01/17/2020 ? Need for re-enrollment in medication assistance program for 2022 calendar year  Pharmacist Clinical Goal(s):   Over the next 90 days, patient will achieve control of T2DM as evidenced by A1C <7.0%. through collaboration with PharmD and provider.   Over the next 90 days, patient will maintain control of LDL as evidenced by LDL <70 mg/dL through collaboration with PharmD and provider.   Over the next 90 days, patient will verbalize ability to afford treatment regimen. through collaboration with PharmD and provider.  Interventions:  Inter-disciplinary care team collaboration (see longitudinal plan of care)  Comprehensive medication review performed; medication list updated in electronic medical record  Perform chart review. Note patient seen by PCP on 12/1 for T2DM follow up as well as fatigue/cough/fever ? Note COVID-19 test negative  Follow up regarding fatigue/cough/fever. Patient confirms she is resting and staying hydrated. ? Patient to follow up with office for worsening or not improving symptoms  Provide empathic listening as patient describes feeling stressed  with work and home life.  ? States misses going to church in person, but not comfortable going right now because of COVID-19 virus prevention.  ? Reports that she is feeling a little depressed/anxious. Denies SI/HI  Denies interest in therapy/counseling at this time.  ? Note patient working with CCM Social Worker ? Will collaborate with CCM Social Worker Diabetes:  Current treatment: ? [name] 20 units once daily ? Metformin ER 500 mg - 3 tablets daily with breakfast ? Ozempic 1 mg once weekly  Current glucose readings: fasting glucose ranging: 76-140 ? Denies hypoglycemic symptoms ? Patient confirms aware of signs, how to manage lows and carries glucose tablets with her  Counsel on importance of regular and well-balanced meals, while limiting carbohydrate portion sizes  Encourage patient to continue to monitor home blood sugar, keep log and bring with her to appointment.   Patient confirms has sufficient supply of Tresiba and Ozempic to last through end of calendar year  Will collaborate with [facility] CPh[facility] for assistance with re-enrollment in [employer] patient assistance program for 2022 calendar year  Patient to call clinic for any new/worsening medical concerns  Hyperlipidemia:  Controlled; current treatment: atorvastatin 40 mg daily   Have counseled patient on importance of medication adherence  Have discussed dietary management  Patient Goals/Self-Care Activities  Over the next 90 days, patient will:  ? Take medications as prescribed ? Check blood glucose daily, document, and provide at future appointments  Keeps log in App on phone ? Collaborate with provider on medication access solutions ? Attends all scheduled provider appointments ? Calls pharmacy for medication refills ? Calls provider office for new concerns  or questions Follow Up Plan: Telephone follow up appointment with care management team member scheduled for: 1/3 at 8:45 am    The patient verbalized  understanding of instructions, educational materials, and care plan provided today and declined offer to receive copy of patient instructions, educational materials, and care plan.    [name], PharmD, [place] [employer] [phone]

## 2020-01-24 ENCOUNTER — Ambulatory Visit: Payer: Self-pay | Admitting: Licensed Clinical Social Worker

## 2020-01-24 DIAGNOSIS — F4322 Adjustment disorder with anxiety: Secondary | ICD-10-CM

## 2020-01-24 DIAGNOSIS — E782 Mixed hyperlipidemia: Secondary | ICD-10-CM

## 2020-01-24 DIAGNOSIS — F419 Anxiety disorder, unspecified: Secondary | ICD-10-CM

## 2020-01-24 DIAGNOSIS — I10 Essential (primary) hypertension: Secondary | ICD-10-CM

## 2020-01-24 DIAGNOSIS — E1165 Type 2 diabetes mellitus with hyperglycemia: Secondary | ICD-10-CM

## 2020-01-24 NOTE — Chronic Care Management (AMB) (Signed)
Chronic Care Management    Clinical Social Work Follow Up Note  01/24/2020 Name: Summer Young MRN: [id number] DOB: 07-07-1950  Summer Young is a 69 y.o. year old female who is a primary care patient of [name], Lupita Raider, FNP. The CCM team was consulted for assistance with Mental Health Counseling and Resources.   Review of patient status, including review of consultants reports, other relevant assessments, and collaboration with appropriate care team members and the patient's provider was performed as part of comprehensive patient evaluation and provision of chronic care management services.    SDOH (Social Determinants of Health) assessments performed: Yes    Outpatient Encounter Medications as of 01/24/2020  Medication Sig Note  . alendronate (FOSAMAX) 70 MG tablet Take 1 tablet (70 mg total) by mouth once a week. Take with a full glass of water on an empty stomach.   . Ascorbic Acid (VITAMIN C) 1000 MG tablet Take 1,000 mg by mouth daily as needed.  09/20/2018: As needed to prevent illness  . atorvastatin (LIPITOR) 40 MG tablet Take 1 tablet by mouth once daily   . Biotin 1000 MCG tablet Take 4,000 mcg by mouth every other day.  01/19/2018: Takes every other day  . blood glucose meter kit and supplies Dispense based on patient and insurance preference. Use up to four times daily as directed. (FOR ICD-10 E10.9, E11.9).   Marland Kitchen Cholecalciferol (VITAMIN D3) 50 MCG (2000 UT) capsule Take 1,000 Units by mouth daily.    . diclofenac Sodium (VOLTAREN) 1 % GEL Apply 2 g topically 3 (three) times daily as needed.   . insulin degludec (TRESIBA FLEXTOUCH) 100 UNIT/ML SOPN FlexTouch Pen Inject 0.26 mLs (26 Units total) into the skin daily. (Patient taking differently: Inject 20 Units into the skin daily. )   . meloxicam (MOBIC) 15 MG tablet Take 1 tablet (15 mg total) by mouth daily as needed for pain.   . metFORMIN (GLUCOPHAGE-XR) 500 MG 24 hr tablet TAKE 3 TABLETS BY MOUTH ONCE DAILY WITH BREAKFAST   .  OZEMPIC, 1 MG/DOSE, 2 MG/1.5ML SOPN INJECT 1 MG SUBCUTANEOUSLY ONCE A WEEK   . polyethylene glycol powder (GLYCOLAX/MIRALAX) 17 GM/SCOOP powder Take 17 g by mouth 2 (two) times daily as needed.   Marland Kitchen spironolactone (ALDACTONE) 25 MG tablet Take 1 tablet by mouth once daily   . vitamin B-12 (CYANOCOBALAMIN) 1000 MCG tablet Take 1,000 mcg by mouth daily.    . vitamin E 400 UNIT capsule Take by mouth.    No facility-administered encounter medications on file as of 01/24/2020.     Goals Addressed            This Visit's Progress   . Track and Manage My Symptoms-Depression       Timeframe:  Long-Range Goal Priority:  High Start Date:    01/24/20                         Expected End Date:   04/24/19                    Follow Up Date -within 90 days   - avoid negative self-talk - develop a personal safety plan - develop a plan to deal with triggers like holidays, anniversaries - exercise at least 2 to 3 times per week - have a plan for how to handle bad days - journal feelings and what helps to feel better or worse - spend time or talk with others  at least 2 to 3 times per week - spend time or talk with others every day - watch for early signs of feeling worse - write in journal every day    Why is this important?    Keeping track of your progress will help your treatment team find the right mix of medicine and therapy for you.   Write in your journal every day.   Day-to-day changes in depression symptoms are normal. It may be more helpful to check your progress at the end of each week instead of every day.     Current Barriers:  . Acute Mental Health needs related to anxiety/stress . Limited social support . Mental Health Concerns  . Family and relationship dysfunction . Social Isolation . Suicidal Ideation/Homicidal Ideation: No  Clinical Social Work Goal(s):  Marland Kitchen Over the next 120 days, patient will work with SW bi-monthly by telephone or in person to reduce or manage symptoms  related to anxiety  . Over the next 120 days, patient will work with SW to address concerns related to gaining mental health support and develop better coping skills to manage symptoms  Interventions: . Patient interviewed and appropriate assessments performed: GAD 7 and PHQ 9. Scores have improved drastically overt the past few months. Patient reported that she went on a vacation during the summer to the [place] and was able to spend time with family which increased her ability to cope with anxiety/stress/triggers more effectively.  . 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 Patient shares that she will go there several times per week to gain socialization, relaxation from massage chair and exercise. Positive reinforcement provided for this self-care implementation.  . Patient shares that she wishes to find a local church in order to gain additional positive support. She has been invited to several churches but they are too far of a drive. Patient wishes to disconnect from all negative influences in her life. LCSW provided education on healthy self-care and boundary implementation.  . Patient shares that she is effectively implementing healthy eating habits into her daily routine to increase her self-care. Positive reinforcement provided for this achievement.  Marland Kitchen LCSW provided education on relaxation techniques such as meditation, deep breathing, massage, or yoga that can activate the body's relaxation response and ease symptoms of acute anxiety. LCSW ask that when pt is struggling with difficult emotions and stress that she start this relaxation response process.  . Discussed plans with patient for ongoing care management follow up and provided patient with direct contact information for care management team . Advised patient to contact CCM program for any urgent case management needs . Assisted patient/caregiver with obtaining information about health plan  benefits . A voluntary and extensive discussion about advanced care planning including explanation and discussion of advanced was undertaken with the patient. Explanation regarding healthcare proxy and living will was reviewed and packet with forms with explanation of how to fill them out was given.   . Encouraged patient to consider long term follow up therapy/counseling . Brief CBTprovided during session  . LCSW has sent a secure email with mental health resources during a previous outreach. LCSW provided brief mental health resource education during session but patient again denies wanting or needing to pursue these resources at this time but was appreciative of the resource education.   Patient Self Care Activities:  . Attends all scheduled provider appointments  Patient Coping Strengths:  . Spirituality . Hopefulness  Patient Self Care Deficits:  .  Lack of socialization and strong support network  Please see past updates related to this goal by clicking on the "Past Updates" button in the selected goal    CCM Pharmacist informed CCM LCSW that patient was having a difficult time with her depression. Patient was appreciative of phone and coping skill education provided. Patient LCSW discussed coping skills for anxiety. SW used empathetic and active and reflective listening, validated patient's feelings/concerns, and provided emotional support. LCSW provided self-care examples to help patient manage their multiple health conditions and improve her mood.   Patient shares that she is no longer attending the church she recently found because they were not following the mask mandate. Patient shares that she has a found a small support group that offers ministry. She plans to start attending this service soon.  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  Patient shares that she was recently prescribed Ambien to  treat her insomnia. LCSW provided education on healthy sleep hygiene and what that looks like. LCSW encouraged patient to implement a night time routine into her schedule that works best for her and that she is able to maintain. Advised patient to implement deep breathing/grounding/meditation/self-care exercises into her nightly routine to combat racing thoughts at night. LCSW encouraged patient to wake up at the same time each day, make her sleeping environment comfortable, exercise when able, to limit naps and to not eat or drink anything right before bed.     Patient Care Plan: General Social Work (Adult)    Problem Identified: Depression Identification (Depression)     Goal: Depressive Symptoms Identified   Start Date: 01/24/2020  This Visit's Progress: On track  Priority: High  Note:   Evidence-based guidance:   Identify risk for depression by reviewing presenting symptoms and risk factors.   Review use of medications that contribute to depression such as steroid, narcotic, sedative, antihypertensive, beta blocker, cytoxic agent.   Review related metabolic processes, including infection, anemia, thyroid dysfunction, kidney failure, heart failure, alcohol or substance use.   Perform depression screening using standardized tools to obtain baseline intensity of depressive symptoms.   Perform or refer for a full diagnostic interview when positive screening results are noted; use DSM-5 criteria to determine appropriate diagnosis (e.g., major depression, persistent depressive disorder, unspecified depressive    disorder).   Notes:    Task: Identify Depressive Symptoms and Facilitate Treatment   Note:   Care Management Activities:    - anxiety screen reviewed - counseling by pharmacist provided - depression screen reviewed - DSM-5 clinical interview performed - medication list reviewed - mental health treatment arranged - participation in psychiatric services encouraged - substance  use assessed - substance use risk screen reviewed    Notes:     Follow Up Plan: SW will follow up with patient by phone over the next quarter  Eula Fried, [place], MSW, [email or web address] Phone: [phone]

## 2020-02-02 ENCOUNTER — Telehealth: Payer: Self-pay

## 2020-02-06 ENCOUNTER — Telehealth: Payer: Self-pay

## 2020-02-16 ENCOUNTER — Ambulatory Visit: Payer: Self-pay | Admitting: General Practice

## 2020-02-16 ENCOUNTER — Telehealth: Payer: Self-pay | Admitting: General Practice

## 2020-02-16 DIAGNOSIS — I10 Essential (primary) hypertension: Secondary | ICD-10-CM

## 2020-02-16 DIAGNOSIS — M549 Dorsalgia, unspecified: Secondary | ICD-10-CM

## 2020-02-16 DIAGNOSIS — G8929 Other chronic pain: Secondary | ICD-10-CM

## 2020-02-16 DIAGNOSIS — E782 Mixed hyperlipidemia: Secondary | ICD-10-CM

## 2020-02-16 DIAGNOSIS — E1165 Type 2 diabetes mellitus with hyperglycemia: Secondary | ICD-10-CM

## 2020-02-16 DIAGNOSIS — M47816 Spondylosis without myelopathy or radiculopathy, lumbar region: Secondary | ICD-10-CM

## 2020-02-16 NOTE — Chronic Care Management (AMB) (Signed)
Chronic Care Management   Follow Up Note   02/16/2020 Name: Summer Young MRN: [id number] DOB: 1950-10-06  Referred by: [name], FNP Reason for referral : Chronic Care Management (RNCM Chronic Disease Management and Care Coordination Needs)   Summer Young is a 69 y.o. year old female who is a primary care patient of [name], FNP. The CCM team was consulted for assistance with chronic disease management and care coordination needs.    Review of patient status, including review of consultants reports, relevant laboratory and other test results, and collaboration with appropriate care team members and the patient's provider was performed as part of comprehensive patient evaluation and provision of chronic care management services.    SDOH (Social Determinants of Health) assessments performed: Yes See Care Plan activities for detailed interventions related to [facility])     Outpatient Encounter Medications as of 02/16/2020  Medication Sig Note  . alendronate (FOSAMAX) 70 MG tablet Take 1 tablet (70 mg total) by mouth once a week. Take with a full glass of water on an empty stomach.   . Ascorbic Acid (VITAMIN C) 1000 MG tablet Take 1,000 mg by mouth daily as needed.  09/20/2018: As needed to prevent illness  . atorvastatin (LIPITOR) 40 MG tablet Take 1 tablet by mouth once daily   . Biotin 1000 MCG tablet Take 4,000 mcg by mouth every other day.  01/19/2018: Takes every other day  . blood glucose meter kit and supplies Dispense based on patient and insurance preference. Use up to four times daily as directed. (FOR ICD-10 E10.9, E11.9).   Marland Kitchen Cholecalciferol (VITAMIN D3) 50 MCG (2000 UT) capsule Take 1,000 Units by mouth daily.    . diclofenac Sodium (VOLTAREN) 1 % GEL Apply 2 g topically 3 (three) times daily as needed.   . insulin degludec (TRESIBA FLEXTOUCH) 100 UNIT/ML SOPN FlexTouch Pen Inject 0.26 mLs (26 Units total) into the skin daily. (Patient taking differently: Inject 20 Units  into the skin daily. )   . meloxicam (MOBIC) 15 MG tablet Take 1 tablet (15 mg total) by mouth daily as needed for pain.   . metFORMIN (GLUCOPHAGE-XR) 500 MG 24 hr tablet TAKE 3 TABLETS BY MOUTH ONCE DAILY WITH BREAKFAST   . OZEMPIC, 1 MG/DOSE, 2 MG/1.5ML SOPN INJECT 1 MG SUBCUTANEOUSLY ONCE A WEEK   . polyethylene glycol powder (GLYCOLAX/MIRALAX) 17 GM/SCOOP powder Take 17 g by mouth 2 (two) times daily as needed.   Marland Kitchen spironolactone (ALDACTONE) 25 MG tablet Take 1 tablet by mouth once daily   . vitamin B-12 (CYANOCOBALAMIN) 1000 MCG tablet Take 1,000 mcg by mouth daily.    . vitamin E 400 UNIT capsule Take by mouth.    No facility-administered encounter medications on file as of 02/16/2020.     Objective:   Goals Addressed              This Visit's Progress   .  COMPLETED: RNCM: "I am doing much better now" (pt-stated)        CARE PLAN ENTRY (see longtitudinal plan of care for additional care plan information)  Current Barriers: Completed- starting in new care plan for ELS system . Chronic Disease Management support, education, and care coordination needs related to HTN, HLD, and DMII  Clinical Goal(s) related to HTN, HLD, and DMII:  Over the next 120 days, patient will:  . Work with the care management team to address educational, disease management, and care coordination needs  . Begin or continue self  health monitoring activities as directed today Measure and record cbg (blood glucose) 1 times daily and adhere to a heart healthy/ADA diet  . Call provider office for new or worsened signs and symptoms Blood pressure findings outside established parameters and New or worsened symptom related to HLD/HTN or other chronic conditions  . Call care management team with questions or concerns . Verbalize basic understanding of patient centered plan of care established today  Interventions related to HTN, HLD, and DMII:  . Evaluation of current treatment plans and patient's adherence to  plan as established by provider.  The patient is doing well with current plan of care related to HTN, HLD, DMII.  Marland Kitchen Assessed patient understanding of disease states.  The patient has a good understanding of chronic conditions. Says her  blood sugars are much better now and says this am her glucose was 100. Range is usually 90 to 120.  Is hopeful that her next hemoglobin A1C will be WNL.  Recently had to get another glucose meter, she has the one touch now. 10-20-2019: The patient is pleased that her blood glucose readings are much better. Her range is 70 to 128. Yesterday morning her blood sugar was 98.  Her hemoglobin A1C was 7.3 on 09-19-2019, down from 7.8 in February of 2021.  Praised for accomplishments. The patient verbalized if her A1C is at goal at the next MD appointment there may be adjustments in her medications regimen. She endorses compliance with her medications. 12-22-2019: The patient is doing well. Has only had a couple of highs and that was when she was having back pain more than usual. She has an appointment 01-18-2020 for evaluation of hemoglobin A1C and follow up.   . Assessed patient's education and care coordination needs.  The patient saw the pcp on 08/11/2019 for some issues with abdominal discomfort and also DM meter. The patient was started on Bentyl and this is helping. 10-20-2019: Abdominal pain has resolved. Is having new increasing pain with back pain (see additional care plan).  The patient educated on hypoglycemia and making sure she is eating high protein meals so she will not have any hypoglycemic events. The patient denies any episodes of hypoglycemia but knows what to look for in the event of blood sugars dropping.  . Provided disease specific education to patient.  Education on maintaining weight and monitoring nutritional intake. The patient realizes that she has to stay away from spicy foods. Is feeling much better this week.  12-22-2019: States her appetite is good and she has not  issues with nausea and vomiting. Endorses compliance with a heart healthy/ADA diet. The patient is working  hard and feels good other than the chronic back pain she is dealing with. She is excited about her upcoming vacation to [place] and then her and her husband will come back and spend Thanksgiving with family.  Nash Dimmer with appropriate clinical care team members regarding patient needs.  The patient is currently working with CCM team pharmacist and LCSW.   Patient Self Care Activities related to HTN, HLD, and DMII:  . Patient is unable to independently self-manage chronic health conditions  Please see past updates related to this goal by clicking on the "Past Updates" button in the selected goal      .  COMPLETED: RNCM: Pt-"i am having more back pain than usual" (pt-stated)        CARE PLAN ENTRY (see longitudinal plan of care for additional care plan information)  Current  Barriers: New clinical goal opened. Closing this one.  . Knowledge Deficits related to unresolved back pain and best treatment for chronic back pain and discomfort . Care Coordination needs related to education/support and treatment  in a patient with chronic back pain with increased exacerbation of pain and discomfort.  . Chronic Disease Management support and education needs related to Chronic back pain and discomfort  Nurse Case Manager Clinical Goal(s):  Marland Kitchen Over the next 120 days, patient will verbalize understanding of plan for treatment and support of chronic back pain and discomfort  . Over the next 120 days, patient will work with [facility] [place] team and pcp to address needs related to increased back pain and best treatment options . Over the next 120 days, patient will demonstrate a decrease in back pain exacerbations as evidenced by evaluation and assessment by pcp and a plan of care to help reduce back pain and discomfort.  . Over the next 120 days, patient will attend all scheduled medical  appointments: patient to call the office to secure an appointment with pcp for follow up and evaluation . Over the next 120 days, patient will demonstrate improved health management independence as evidenced byreduced/resolved pain and discomfort and ability to perform ADLS/IADLS without difficulty . Over the next 120 days, patient will demonstrate understanding of rationale for each prescribed medication as evidenced by compliance  . Over the next 120 days, patient will work with CM team pharmacist to review current medication regimen and offer recommendations as warranted.   Interventions:  . Inter-disciplinary care team collaboration (see longitudinal plan of care) . Evaluation of current treatment plan related to back pain and discomfort and patient's adherence to plan as established by provider. 12-22-2019: The patient states that her back pain is much better. She had a couple of days that is was bothering her but for the most part it is much better.  She is thankful for this.  . Advised patient to call the provider to get an appointment, write down questions to ask the provider at appointment, and to call for worsening sx/sx of pain or discomfort.  12-22-2019: The patient knows to call for changes in her condition. Has a follow up appointment with pcp on 01-18-2020.  Marland Kitchen Provided education to patient re: talking to pcp about alternate therapies and if the use of a back brace would benefit due to the type of work she does. 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 She has been having to do extra. She realizes that her back hurts more when she is working extra. Education on pacing activity. 12-22-2019: The patient states she is still doing the same amount of activity but she is pacing her activity and doing well. She knows her limitations.  . Reviewed medications with patient and discussed compliance.  The patient is taking meloxicam at  night and this helps a lot. She is also using the gel but can not tell that it really helps her. Is open to recommendations.  . Discussed plans with patient for ongoing care management follow up and provided patient with direct contact information for care management team . Provided patient with back pain and safety educational materials related to chronic back pain and discomfort . Reviewed scheduled/upcoming provider appointments including: the patient to call to get an appointment to see pcp for evaluation and treatment options. 12-22-2019: Next appointment with the pcp is 01-18-2020.  Marland Kitchen Pharmacy referral  for review of medications and recommendations on how to  help with back pain and discomfort by pharmacological means.   Patient Self Care Activities:  . Patient verbalizes understanding of plan to work with the pcp, RNCM, and CCM team to optimize plan of care for management of chronic back pain and discomfort.  . Performs ADL's independently . Performs IADL's independently . Calls provider office for new concerns or questions . Unable to independently manage chronic back pain as evidence of exacerbation  Please see past updates related to this goal by clicking on the "Past Updates" button in the selected goal               Patient Care Plan: RNCM: Diabetes Type 2 (Adult)    Problem Identified: RNCM: Management of DM   Priority: High    Goal: RNCM: Management of DM   Priority: Medium  Note:   Objective:  Lab Results  Component Value Date   HGBA1C 7.4 (A) 01/18/2020 .   Lab Results  Component Value Date   CREATININE 0.82 04/12/2019   CREATININE 0.79 06/10/2018   CREATININE 0.87 03/03/2018 .   Marland Kitchen No results found for: EGFR Current Barriers:  Marland Kitchen Knowledge Deficits related to basic Diabetes pathophysiology and self care/management . Knowledge Deficits related to medications used for management of diabetes . Knowledge Deficits related to self administration of injectable diabetes  medications (Ozempic) . Limited Social Support . Unable to independently manage DM as evidence of hemoglobin A1C or 7.4 on 01-18-2020 . Lacks social connections . Does not contact provider office for questions/concerns Case Manager Clinical Goal(s):  Marland Kitchen Over the next 120 days, patient will demonstrate improved adherence to prescribed treatment plan for diabetes self care/management as evidenced by:  . daily monitoring and recording of CBG  . adherence to ADA/ carb modified diet . exercise 4/5 days/week . adherence to prescribed medication regimen Interventions:  . Provided education to patient about basic DM disease process . Reviewed medications with patient and discussed importance of medication adherence . Discussed plans with patient for ongoing care management follow up and provided patient with direct contact information for care management team . Provided patient with written educational materials related to hypo and hyperglycemia and importance of correct treatment . Advised patient, providing education and rationale, to check cbg BID and record, calling pcp for findings outside established parameters.  The patient states 5 days ago her blood sugars were 175-185 but they have come back down to 96 range. She is mindful of blood sugar readings.  Wants to stay on track  . Review of patient status, including review of consultants reports, relevant laboratory and other test results, and medications completed. . - barriers to adherence to treatment plan identified . - blood glucose monitoring encouraged- fasting <130 and post prandial <180 . - blood glucose readings reviewed- range over the last 2 weeks have been 96 to 185 . - mutual A1C goal set or reviewed- goal of 7.0 or less . - resources required to improve adherence to care identified . - self-awareness of signs/symptoms of hypo or hyperglycemia encouraged . - use of blood glucose monitoring log promoted Patient Goals/Self-Care  Activities . Over the next 120 days, patient will:  - UNABLE to independently manage DM Adheres to prescribed ADA/carb modified Follow Up Plan: Telephone follow up appointment with care management team member scheduled for:   04-12-2020 at 0945 am   Task: RNCM: Alleviate Barriers to Glycemic Management   Note:   Care Management  Activities:    - barriers to adherence to treatment plan identified - blood glucose monitoring encouraged - blood glucose readings reviewed - mutual A1C goal set or reviewed - resources required to improve adherence to care identified - self-awareness of signs/symptoms of hypo or hyperglycemia encouraged - use of blood glucose monitoring log promoted       Patient Care Plan: RNCM: Chronic Back  Pain (Adult)    Problem Identified: RNCM: Pain Management Plan (Chronic Pain)   Priority: High    Goal: RNCM: Pain Management Plan Developed for Chroni Back pain   Priority: High  Note:   Current Barriers:  Marland Kitchen Knowledge Deficits related to management of Chronic back pain and osteoarthritis . Care Coordination needs related to effective management of pain in a patient with chronic back pain and discomfort . Chronic Disease Management support and education needs related to Chronic pain and discomfort  . Lacks caregiver support.  . Unable to independently manage pain and discomfort of chronic back pain . Does not adhere to provider recommendations re: years ago the patient admits she did not complete PT on her back from a work injury and feels this significantly impacts her ability to have relief now.  . Does not contact provider office for questions/concerns  Nurse Case Manager Clinical Goal(s):  Marland Kitchen Over the next 120 days, patient will verbalize understanding of plan for management of Chronic pain and discomfort  . Over the next 120 days, patient will work with pcp and CCM to address needs related to ongoing chronic pain and discomfort . Over the next 120 days,  patient will demonstrate a decrease in pain exacerbations as evidenced by compliance with the plan of care and exploring options that will benefit the patient and help with pain relief  . Over the next 120 days, patient will demonstrate improved adherence to prescribed treatment plan for chronic pain and discomfort  as evidenced bydecreased pain level  . Over the next 120 days, patient will demonstrate improved health management independence as evidenced byable to perform ADLS/IADLS  Interventions:  . 1:1 collaboration with Malfi, Lupita Raider, FNP regarding development and update of comprehensive plan of care as evidenced by provider attestation and co-signature . Inter-disciplinary care team collaboration (see longitudinal plan of care) . Evaluation of current treatment plan related to chronic pain and discomfort and patient's adherence to plan as established by provider. . Advised patient to call the provider for changes in level or intensity of pain . Provided education to patient re: alternative pain relief methods and pacing activites . Reviewed medications with patient and discussed compliance . Discussed plans with patient for ongoing care management follow up and provided patient with direct contact information for care management team . Provided patient with pain relief educational materials related to pain and discomfort  of chronic back pain.  . - mutually acceptable comfort goal set . - pain assessed . - pain management plan developed . - pain treatment goals reviewed . - patient response to treatment assessed . - careful application of heat or ice encouraged . - complementary therapy use encouraged . - deep breathing, relaxation and mindfulness use promoted . - effectiveness of pharmacologic therapy monitored . - motivation and barriers to change assessed and addressed . - mutually acceptable comfort goal set . - participation in physical therapy encouraged . - anxiety related to  change in medications acknowledged . - balance, gait and fall risk reviewed . - quality of sleep assessed . - safety plan developed . -  sleep hygiene techniques encouraged . - strategies to improve or maintain safety promoted  Patient Goals/Self-Care Activities Over the next 120 days, patient will:  - Patient will self administer medications as prescribed Patient will attend all scheduled provider appointments Patient will call provider office for new concerns or questions Patient will work with BSW to address care coordination needs and will continue to work with the clinical team to address health care and disease management related needs.    Follow Up Plan: Telephone follow up appointment with care management team member scheduled for: 04-12-2020 at 0945 am      Task: RNCM: Partner to Develop Chronic Pain Management Plan   Note:   Care Management Activities:    - mutually acceptable comfort goal set - pain assessed - pain management plan developed - pain treatment goals reviewed - patient response to treatment assessed      Problem Identified: RNCM: Chronic Pain Management (Chronic Pain)     Goal: RNCM: Chronic Pain Managed   Note:   Evidence-based guidance:   Address common beliefs about pain, such as pain is to be endured, a normal part of aging or that it is not "real"; feelings of resignation that nothing can be done and that complaining will be a sign of weakness.   Assess pain level, treatment efficacy and patient response at regular intervals using a consistent pain scale.   Assess pain using self-report (most reliable), family/caregiver report, validated pain scale; consider impact on quality of life.   Determine if pain is associated with mobility or at rest, location, intensity, frequency, duration, recurrence, pattern and description (e.g., cramping, burning, aching), triggers and relieving factors.    Anticipate referral to pain education program, pain  management support or community resources for specific diagnoses (e.g., cancer, fibromyalgia, multiple sclerosis).   Explore fears associated with anticipated or imagined pain; encourage acceptance-based approaches.   Encourage exposure to experiences previously avoided due to fear of pain.   Anticipate referral to pain management specialist, physical therapist, addiction specialist (if history of substance use), psychotherapist; advocate for consultation with pharmacist.   Initiate nonpharmacologic measures, such as cognitive behavior therapy, mindfulness, guided imagery, massage, distraction, relaxation, chiropractic manipulation, dietary supplements or acupuncture.   Provide multimodal treatment interventions, such as physical activity, therapeutic exercise, yoga, TENS (transcutaneous electrical nerve stimulation) and manual therapy.   Train in functional activity modifications, such as body mechanics, posture, ergonomics, energy conservation and activity pacing.   Encourage use of local anesthetic or analgesic therapy as an adjunct for pain control (e.g., lidocaine patch, capsaicin cream, topical nonsteroidal anti-inflammatory drugs).   Prepare patient for use of pharmacologic therapy in a stepped approach that may include acetaminophen, nonsteroidal anti-inflammatory drugs, opioid, antiepileptic, antidepressant or nonbenzodiazepine muscle relaxant.  Review efficacy, tolerability, adherence and manage medication-induced side effects.   Notes:    Task: RNCM: Alleviate Barriers to Chronic Pain Management   Note:   Care Management Activities:    - careful application of heat or ice encouraged - complementary therapy use encouraged - deep breathing, relaxation and mindfulness use promoted - effectiveness of pharmacologic therapy monitored - motivation and barriers to change assessed and addressed - mutually acceptable comfort goal set - pain assessed - pain treatment goals reviewed -  participation in physical therapy encouraged       Problem Identified: RNCM: Harm or Injury (Chronic Pain)   Priority: High    Goal: RNCM Harm or Injury Prevented   Note:   Evidence-based guidance:   Address  risk for personal injury, such as falls, motor vehicle accidents, self-harm due to medication side effects, compromised mobility or diminished perception, reasoning, decision-making and judgment.   Engage family in providing a safe home environment and closely monitoring changes in the patient's level of sedation and emotional state, such as negativity, hopelessness and suicidal ideation.   Explore suicidal tendencies compassionately, yet directly, by asking about suicidal ideation, attempt history and family history.   Maintain frequent, structured and supportive contact, such as by phone, office or home visit; collaborate closely with behavioral health specialists or psychiatry.   Make immediate arrangements for evaluation at emergency department, community mental health agency or other psychiatric service when patient expresses positive suicidal ideation with a plan and access to lethal means.   Promote fall risk prevention by making home and environmental adjustments; provide clear instructions regarding ability to drive.   Assess for opioid-induced constipation; provide anticipatory guidance regarding prevention when beginning opioid use by using stool softener or laxative, as well as increasing fluids and dietary fiber.   When opioid-induced constipation is noted, optimize lifestyle interventions and anticipate the use of opioid antagonist as well as tapering, discontinuation or change of opioid.   Encourage frequent oral hygiene (brushing and rinsing), the use of xylitol-containing gum as well as sugar-free and decaffeinated beverages when dry mouth is reported.   Assess for signs and symptoms of opioid endocrinopathy, such as sexual dysfunction, decreased libido,  osteoporosis, osteopenia or infertility.   When endocrinopathy is present, anticipate changing opioid medication, tapering or discontinuation of opioid or initiation of hormone supplementation.   Assess for signs/symptoms of sleep-disordered breathing.   Anticipate referral for sleep study and potential tapering or discontinuation of opioid and initiation of noninvasive positive pressure breathing or adaptive servo ventilation device.   Evaluate risk of opioid misuse prior to or early in treatment with opioids.   Provide anticipatory guidance regarding use and misuse of opioid medication, including not crushing pills, dissolving in juice or mixing with applesauce; consider change to crush-resistant opioid.   Complete periodic screening for opioid misuse and/or signs of substance tolerance (increased dose to reach desired effect, decreased effect with same dose).   Monitor drug-taking behaviors, such as hoarding medication, independently increasing dose, using for other than analgesia and seeing multiple physicians for prescriptions; review state prescription drug monitoring database.   Consider written agreement if patient has used opioids for more than 30 days or episodically over 1 year; required use of nonpharmacologic therapy, urine testing, pill counting, banning sharing or selling of opioids.   When tapering or stopping opioids, frame the discussion in terms of safety and efficacy; reaffirm commitment to patient's health and new treatment plan; respond to fear with empathy; maintain consistent message and approach.   Notes:    Task: RNCM: Identify and Reduce Risks for Harm or Injury   Note:   Care Management Activities:    - anxiety related to change in medications acknowledged - balance, gait and fall risk reviewed - quality of sleep assessed - safety plan developed - sleep hygiene techniques encouraged - strategies to improve or maintain safety promoted       Patient Care  Plan: RNCM: Management of Hypertension and HLD (Adult)    Problem Identified: RNCM: Hypertension and HLD Management  (Hypertension)   Priority: Medium  Note:   The patient has HTN and HLD   Goal: RNCM: Management of HTN and HLD   Expected End Date: 06/15/2020  Priority: Medium  Note:   Objective:  .  Last practice recorded BP readings:  BP Readings from Last 3 Encounters:  01/18/20 137/70  10/28/19 120/70  09/19/19 124/74 .   Marland Kitchen Most recent eGFR/CrCl: No results found for: EGFR  No components found for: CRCL Current Barriers:  Marland Kitchen Knowledge Deficits related to basic understanding of hypertension and HLD pathophysiology and self care management . Knowledge Deficits related to understanding of medications prescribed for management of hypertension and HLD . Unable to independently manage chronic conditions of HTN and HLD . Does not contact provider office for questions/concerns Case Manager Clinical Goal(s):  Marland Kitchen Over the next 120 days, patient will verbalize understanding of plan for hypertension management . Over the next 120 days, patient will attend all scheduled medical appointments: saw pcp on 01-18-2020. Knows to call for changes in condition . Over the next 120 days, patient will demonstrate improved adherence to prescribed treatment plan for hypertension as evidenced by taking all medications as prescribed, monitoring and recording blood pressure as directed, adhering to low sodium/DASH diet . Over the next 120 days, patient will demonstrate improved health management independence as evidenced by checking blood pressure as directed and notifying PCP if SBP>160 or DBP > 90, taking all medications as prescribe, and adhering to a low sodium diet as discussed. Interventions:  . Evaluation of current treatment plan related to hypertension self management and patient's adherence to plan as established by provider. . Provided education to patient re: stroke prevention, s/s of heart attack and  stroke, DASH diet, complications of uncontrolled blood pressure . Reviewed medications with patient and discussed importance of compliance . Discussed plans with patient for ongoing care management follow up and provided patient with direct contact information for care management team . Advised patient, providing education and rationale, to monitor blood pressure daily and record, calling PCP for findings outside established parameters.  . Review of recent sinus/cold congestion. Took COVID test and the test was negative . - blood pressure trends reviewed . - depression screen reviewed . - home or ambulatory blood pressure monitoring encouraged . - healthy diet promoted . - healthy family lifestyle promoted . - medication side effects managed . - pain assessed and managed . - patient response to treatment assessed . - quality of sleep assessed . - reduction of dietary sodium encouraged . - reduction in sedentary activities encouraged . - response to pharmacologic therapy monitored . - sleep hygiene techniques encouraged Patient Goals/Self-Care Activities . Over the next 120 days, patient will:  - Calls provider office for new concerns, questions, or BP outside discussed parameters Checks BP and records as discussed Follows a low sodium diet/DASH diet Follow Up Plan: Telephone follow up appointment with care management team member scheduled for:  04-12-2020 at 0945 am   Task: RNCM: Identify and Monitor Blood Pressure Elevation   Note:   Care Management Activities:    - blood pressure trends reviewed - depression screen reviewed - home or ambulatory blood pressure monitoring encouraged      Problem Identified: RNCM: Disease Progression (Hypertension)   Priority: Medium  Note:   See goal for HTN   Goal: RNCM: Disease Progression Prevented or Minimized   Priority: Medium  Note:    Notes: The patient has good control of HTN. Last bp in the office was 137/70   Task: RNCM:  Alleviate Barriers to Hypertension Treatment   Note:   Care Management Activities:    - healthy diet promoted - healthy family lifestyle promoted - medication side effects managed - pain assessed  and managed - patient response to treatment assessed - quality of sleep assessed - reduction of dietary sodium encouraged - reduction in sedentary activities encouraged - response to pharmacologic therapy monitored - sleep hygiene techniques encouraged        Plan:   Telephone follow up appointment with care management team member scheduled for: 04-12-2020 at [place] am   Tyrone, MSN, [facility] Mobile: [phone]

## 2020-02-16 NOTE — Patient Instructions (Addendum)
Visit Information      Patient Care Plan: RNCM: Diabetes Type 2 (Adult)    Problem Identified: RNCM: Management of DM   Priority: High    Goal: RNCM: Management of DM   Priority: Medium  Note:   Objective:  Lab Results  Component Value Date   HGBA1C 7.4 (A) 01/18/2020 .   Lab Results  Component Value Date   CREATININE 0.82 04/12/2019   CREATININE 0.79 06/10/2018   CREATININE 0.87 03/03/2018 .   Marland Kitchen No results found for: EGFR Current Barriers:  Marland Kitchen Knowledge Deficits related to basic Diabetes pathophysiology and self care/management . Knowledge Deficits related to medications used for management of diabetes . Knowledge Deficits related to self administration of injectable diabetes medications (Ozempic) . Limited Social Support . Unable to independently manage DM as evidence of hemoglobin A1C or 7.4 on 01-18-2020 . Lacks social connections . Does not contact provider office for questions/concerns Case Manager Clinical Goal(s):  Marland Kitchen Over the next 120 days, patient will demonstrate improved adherence to prescribed treatment plan for diabetes self care/management as evidenced by:  . daily monitoring and recording of CBG  . adherence to ADA/ carb modified diet . exercise 4/5 days/week . adherence to prescribed medication regimen Interventions:  . Provided education to patient about basic DM disease process . Reviewed medications with patient and discussed importance of medication adherence . Discussed plans with patient for ongoing care management follow up and provided patient with direct contact information for care management team . Provided patient with written educational materials related to hypo and hyperglycemia and importance of correct treatment . Advised patient, providing education and rationale, to check cbg BID and record, calling pcp for findings outside established parameters.  The patient states 5 days ago her blood sugars were 175-185 but they have come back  down to 96 range. She is mindful of blood sugar readings.  Wants to stay on track  . Review of patient status, including review of consultants reports, relevant laboratory and other test results, and medications completed. . - barriers to adherence to treatment plan identified . - blood glucose monitoring encouraged- fasting <130 and post prandial <180 . - blood glucose readings reviewed- range over the last 2 weeks have been 96 to 185 . - mutual A1C goal set or reviewed- goal of 7.0 or less . - resources required to improve adherence to care identified . - self-awareness of signs/symptoms of hypo or hyperglycemia encouraged . - use of blood glucose monitoring log promoted Patient Goals/Self-Care Activities . Over the next 120 days, patient will:  - UNABLE to independently manage DM Adheres to prescribed ADA/carb modified Follow Up Plan: Telephone follow up appointment with care management team member scheduled for:   04-12-2020 at 0945 am   Task: RNCM: Alleviate Barriers to Glycemic Management   Note:   Care Management Activities:    - barriers to adherence to treatment plan identified - blood glucose monitoring encouraged - blood glucose readings reviewed - mutual A1C goal set or reviewed - resources required to improve adherence to care identified - self-awareness of signs/symptoms of hypo or hyperglycemia encouraged - use of blood glucose monitoring log promoted       Patient Care Plan: RNCM: Chronic Back  Pain (Adult)    Problem Identified: RNCM: Pain Management Plan (Chronic Pain)   Priority: High    Goal: RNCM: Pain Management Plan Developed for Chroni Back pain   Priority: High  Note:   Current Barriers:  .  Knowledge Deficits related to management of Chronic back pain and osteoarthritis . Care Coordination needs related to effective management of pain in a patient with chronic back pain and discomfort . Chronic Disease Management support and education needs related to  Chronic pain and discomfort  . Lacks caregiver support.  . Unable to independently manage pain and discomfort of chronic back pain . Does not adhere to provider recommendations re: years ago the patient admits she did not complete PT on her back from a work injury and feels this significantly impacts her ability to have relief now.  . Does not contact provider office for questions/concerns  Nurse Case Manager Clinical Goal(s):  Marland Kitchen Over the next 120 days, patient will verbalize understanding of plan for management of Chronic pain and discomfort  . Over the next 120 days, patient will work with pcp and CCM to address needs related to ongoing chronic pain and discomfort . Over the next 120 days, patient will demonstrate a decrease in pain exacerbations as evidenced by compliance with the plan of care and exploring options that will benefit the patient and help with pain relief  . Over the next 120 days, patient will demonstrate improved adherence to prescribed treatment plan for chronic pain and discomfort  as evidenced bydecreased pain level  . Over the next 120 days, patient will demonstrate improved health management independence as evidenced byable to perform ADLS/IADLS  Interventions:  . 1:1 collaboration with Malfi, Lupita Raider, FNP regarding development and update of comprehensive plan of care as evidenced by provider attestation and co-signature . Inter-disciplinary care team collaboration (see longitudinal plan of care) . Evaluation of current treatment plan related to chronic pain and discomfort and patient's adherence to plan as established by provider. . Advised patient to call the provider for changes in level or intensity of pain . Provided education to patient re: alternative pain relief methods and pacing activites . Reviewed medications with patient and discussed compliance . Discussed plans with patient for ongoing care management follow up and provided patient with direct contact  information for care management team . Provided patient with pain relief educational materials related to pain and discomfort  of chronic back pain.  . - mutually acceptable comfort goal set . - pain assessed . - pain management plan developed . - pain treatment goals reviewed . - patient response to treatment assessed . - careful application of heat or ice encouraged . - complementary therapy use encouraged . - deep breathing, relaxation and mindfulness use promoted . - effectiveness of pharmacologic therapy monitored . - motivation and barriers to change assessed and addressed . - mutually acceptable comfort goal set . - participation in physical therapy encouraged . - anxiety related to change in medications acknowledged . - balance, gait and fall risk reviewed . - quality of sleep assessed . - safety plan developed . - sleep hygiene techniques encouraged . - strategies to improve or maintain safety promoted  Patient Goals/Self-Care Activities Over the next 120 days, patient will:  - Patient will self administer medications as prescribed Patient will attend all scheduled provider appointments Patient will call provider office for new concerns or questions Patient will work with BSW to address care coordination needs and will continue to work with the clinical team to address health care and disease management related needs.    Follow Up Plan: Telephone follow up appointment with care management team member scheduled for: 04-12-2020 at 0945 am      Task: RNCM: Partner to  Develop Chronic Pain Management Plan   Note:   Care Management Activities:    - mutually acceptable comfort goal set - pain assessed - pain management plan developed - pain treatment goals reviewed - patient response to treatment assessed      Problem Identified: RNCM: Chronic Pain Management (Chronic Pain)     Goal: RNCM: Chronic Pain Managed   Note:   Evidence-based guidance:   Address common  beliefs about pain, such as pain is to be endured, a normal part of aging or that it is not "real"; feelings of resignation that nothing can be done and that complaining will be a sign of weakness.   Assess pain level, treatment efficacy and patient response at regular intervals using a consistent pain scale.   Assess pain using self-report (most reliable), family/caregiver report, validated pain scale; consider impact on quality of life.   Determine if pain is associated with mobility or at rest, location, intensity, frequency, duration, recurrence, pattern and description (e.g., cramping, burning, aching), triggers and relieving factors.    Anticipate referral to pain education program, pain management support or community resources for specific diagnoses (e.g., cancer, fibromyalgia, multiple sclerosis).   Explore fears associated with anticipated or imagined pain; encourage acceptance-based approaches.   Encourage exposure to experiences previously avoided due to fear of pain.   Anticipate referral to pain management specialist, physical therapist, addiction specialist (if history of substance use), psychotherapist; advocate for consultation with pharmacist.   Initiate nonpharmacologic measures, such as cognitive behavior therapy, mindfulness, guided imagery, massage, distraction, relaxation, chiropractic manipulation, dietary supplements or acupuncture.   Provide multimodal treatment interventions, such as physical activity, therapeutic exercise, yoga, TENS (transcutaneous electrical nerve stimulation) and manual therapy.   Train in functional activity modifications, such as body mechanics, posture, ergonomics, energy conservation and activity pacing.   Encourage use of local anesthetic or analgesic therapy as an adjunct for pain control (e.g., lidocaine patch, capsaicin cream, topical nonsteroidal anti-inflammatory drugs).   Prepare patient for use of pharmacologic therapy in a stepped  approach that may include acetaminophen, nonsteroidal anti-inflammatory drugs, opioid, antiepileptic, antidepressant or nonbenzodiazepine muscle relaxant.  Review efficacy, tolerability, adherence and manage medication-induced side effects.   Notes:    Task: RNCM: Alleviate Barriers to Chronic Pain Management   Note:   Care Management Activities:    - careful application of heat or ice encouraged - complementary therapy use encouraged - deep breathing, relaxation and mindfulness use promoted - effectiveness of pharmacologic therapy monitored - motivation and barriers to change assessed and addressed - mutually acceptable comfort goal set - pain assessed - pain treatment goals reviewed - participation in physical therapy encouraged       Problem Identified: RNCM: Harm or Injury (Chronic Pain)   Priority: High    Goal: RNCM Harm or Injury Prevented   Note:   Evidence-based guidance:   Address risk for personal injury, such as falls, motor vehicle accidents, self-harm due to medication side effects, compromised mobility or diminished perception, reasoning, decision-making and judgment.   Engage family in providing a safe home environment and closely monitoring changes in the patient's level of sedation and emotional state, such as negativity, hopelessness and suicidal ideation.   Explore suicidal tendencies compassionately, yet directly, by asking about suicidal ideation, attempt history and family history.   Maintain frequent, structured and supportive contact, such as by phone, office or home visit; collaborate closely with behavioral health specialists or psychiatry.   Make immediate arrangements for evaluation at emergency department, community  mental health agency or other psychiatric service when patient expresses positive suicidal ideation with a plan and access to lethal means.   Promote fall risk prevention by making home and environmental adjustments; provide clear  instructions regarding ability to drive.   Assess for opioid-induced constipation; provide anticipatory guidance regarding prevention when beginning opioid use by using stool softener or laxative, as well as increasing fluids and dietary fiber.   When opioid-induced constipation is noted, optimize lifestyle interventions and anticipate the use of opioid antagonist as well as tapering, discontinuation or change of opioid.   Encourage frequent oral hygiene (brushing and rinsing), the use of xylitol-containing gum as well as sugar-free and decaffeinated beverages when dry mouth is reported.   Assess for signs and symptoms of opioid endocrinopathy, such as sexual dysfunction, decreased libido, osteoporosis, osteopenia or infertility.   When endocrinopathy is present, anticipate changing opioid medication, tapering or discontinuation of opioid or initiation of hormone supplementation.   Assess for signs/symptoms of sleep-disordered breathing.   Anticipate referral for sleep study and potential tapering or discontinuation of opioid and initiation of noninvasive positive pressure breathing or adaptive servo ventilation device.   Evaluate risk of opioid misuse prior to or early in treatment with opioids.   Provide anticipatory guidance regarding use and misuse of opioid medication, including not crushing pills, dissolving in juice or mixing with applesauce; consider change to crush-resistant opioid.   Complete periodic screening for opioid misuse and/or signs of substance tolerance (increased dose to reach desired effect, decreased effect with same dose).   Monitor drug-taking behaviors, such as hoarding medication, independently increasing dose, using for other than analgesia and seeing multiple physicians for prescriptions; review state prescription drug monitoring database.   Consider written agreement if patient has used opioids for more than 30 days or episodically over 1 year; required use of  nonpharmacologic therapy, urine testing, pill counting, banning sharing or selling of opioids.   When tapering or stopping opioids, frame the discussion in terms of safety and efficacy; reaffirm commitment to patient's health and new treatment plan; respond to fear with empathy; maintain consistent message and approach.   Notes:    Task: RNCM: Identify and Reduce Risks for Harm or Injury   Note:   Care Management Activities:    - anxiety related to change in medications acknowledged - balance, gait and fall risk reviewed - quality of sleep assessed - safety plan developed - sleep hygiene techniques encouraged - strategies to improve or maintain safety promoted       Patient Care Plan: RNCM: Management of Hypertension and HLD (Adult)    Problem Identified: RNCM: Hypertension and HLD Management  (Hypertension)   Priority: Medium  Note:   The patient has HTN and HLD   Goal: RNCM: Management of HTN and HLD   Expected End Date: 06/15/2020  Priority: Medium  Note:   Objective:  . Last practice recorded BP readings:  BP Readings from Last 3 Encounters:  01/18/20 137/70  10/28/19 120/70  09/19/19 124/74 .   Marland Kitchen Most recent eGFR/CrCl: No results found for: EGFR  No components found for: CRCL Current Barriers:  Marland Kitchen Knowledge Deficits related to basic understanding of hypertension and HLD pathophysiology and self care management . Knowledge Deficits related to understanding of medications prescribed for management of hypertension and HLD . Unable to independently manage chronic conditions of HTN and HLD . Does not contact provider office for questions/concerns Case Manager Clinical Goal(s):  Marland Kitchen Over the next 120 days, patient will verbalize  understanding of plan for hypertension management . Over the next 120 days, patient will attend all scheduled medical appointments: saw pcp on 01-18-2020. Knows to call for changes in condition . Over the next 120 days, patient will demonstrate  improved adherence to prescribed treatment plan for hypertension as evidenced by taking all medications as prescribed, monitoring and recording blood pressure as directed, adhering to low sodium/DASH diet . Over the next 120 days, patient will demonstrate improved health management independence as evidenced by checking blood pressure as directed and notifying PCP if SBP>160 or DBP > 90, taking all medications as prescribe, and adhering to a low sodium diet as discussed. Interventions:  . Evaluation of current treatment plan related to hypertension self management and patient's adherence to plan as established by provider. . Provided education to patient re: stroke prevention, s/s of heart attack and stroke, DASH diet, complications of uncontrolled blood pressure . Reviewed medications with patient and discussed importance of compliance . Discussed plans with patient for ongoing care management follow up and provided patient with direct contact information for care management team . Advised patient, providing education and rationale, to monitor blood pressure daily and record, calling PCP for findings outside established parameters.  . Review of recent sinus/cold congestion. Took COVID test and the test was negative . - blood pressure trends reviewed . - depression screen reviewed . - home or ambulatory blood pressure monitoring encouraged . - healthy diet promoted . - healthy family lifestyle promoted . - medication side effects managed . - pain assessed and managed . - patient response to treatment assessed . - quality of sleep assessed . - reduction of dietary sodium encouraged . - reduction in sedentary activities encouraged . - response to pharmacologic therapy monitored . - sleep hygiene techniques encouraged Patient Goals/Self-Care Activities . Over the next 120 days, patient will:  - Calls provider office for new concerns, questions, or BP outside discussed parameters Checks BP and  records as discussed Follows a low sodium diet/DASH diet Follow Up Plan: Telephone follow up appointment with care management team member scheduled for:  04-12-2020 at 0945 am   Task: RNCM: Identify and Monitor Blood Pressure Elevation   Note:   Care Management Activities:    - blood pressure trends reviewed - depression screen reviewed - home or ambulatory blood pressure monitoring encouraged      Problem Identified: RNCM: Disease Progression (Hypertension)   Priority: Medium  Note:   See goal for HTN   Goal: RNCM: Disease Progression Prevented or Minimized   Priority: Medium  Note:    Notes: The patient has good control of HTN. Last bp in the office was 137/70   Task: RNCM: Alleviate Barriers to Hypertension Treatment   Note:   Care Management Activities:    - healthy diet promoted - healthy family lifestyle promoted - medication side effects managed - pain assessed and managed - patient response to treatment assessed - quality of sleep assessed - reduction of dietary sodium encouraged - reduction in sedentary activities encouraged - response to pharmacologic therapy monitored - sleep hygiene techniques encouraged               The patient verbalized understanding of instructions, educational materials, and care plan provided today and declined offer to receive copy of patient instructions, educational materials, and care plan.   Telephone follow up appointment with care management team member scheduled for: 04-12-2020 at [place] am  [place], MSN, [place]  [facility] Mobile: [phone]

## 2020-02-20 ENCOUNTER — Ambulatory Visit: Payer: Self-pay | Admitting: Pharmacist

## 2020-02-20 DIAGNOSIS — E1165 Type 2 diabetes mellitus with hyperglycemia: Secondary | ICD-10-CM

## 2020-02-20 NOTE — Patient Instructions (Signed)
Thank you allowing the Chronic Care Management Team to be a part of your care! It was a pleasure speaking with you today!     CCM (Chronic Care Management) Team    Alto [place] RN, MSN, CCM Nurse Care Coordinator  [phone]   Duanne Moron PharmD  Clinical Pharmacist  [phone]   Dickie La LCSW Clinical Social Worker [phone]  Visit Information   Patient Care Plan: General Pharmacy (Adult)    Problem Identified: Disease Progression     Long-Range Goal: Disease Progression Prevented or Minimized   Start Date: 01/23/2020  Expected End Date: 04/22/2020  Recent Progress: On track  Priority: High  Note:   Current Barriers:  . Unable to independently afford treatment regimen o Patient approved for [place] and Ozempic medication assistance from [employer] through 01/17/2020 o Need for re-enrollment in medication assistance program for 2022 calendar year  Pharmacist Clinical Goal(s):  Marland Kitchen Over the next 90 days, patient will achieve control of T2DM as evidenced by A1C <7.0%. through collaboration with PharmD and provider.  . Over the next 90 days, patient will maintain control of LDL as evidenced by LDL <70 mg/dL through collaboration with PharmD and provider.  . Over the next 90 days, patient will verbalize ability to afford treatment regimen. through collaboration with PharmD and provider.  Interventions: . Inter-disciplinary care team collaboration (see longitudinal plan of care)  Diabetes: . Current treatment: o Tresiba 20 units once daily o Metformin ER 500 mg - 3 tablets daily with breakfast o Ozempic 1 mg once weekly . Current glucose readings: fasting glucose ranging: 93-114 o Denies hypoglycemic symptoms o Note patient carries glucose tablets with her . Discussed importance of regular and well-balanced meals, while limiting carbohydrate portion sizes o Reports has done well with limiting her carbohydrate portion sizes over the holidays . Patient  confirms has >1.5 months supply of Tresiba and Ozempic remaining from assistance program . Will continue to collaborate with [facility] for assistance with re-enrollment in [employer] patient assistance program for 2022 calendar year o Note [facility] mailed application to program on 12/16 . Patient to call clinic for any new/worsening medical concerns  Hyperlipidemia: . Controlled; current treatment: atorvastatin 40 mg daily  . Have counseled patient on importance of medication adherence . Have discussed dietary management  Patient Goals/Self-Care Activities . Over the next 90 days, patient will:  o Take medications as prescribed o Check blood glucose daily, document, and provide at future appointments - Keeps log in App on phone o Collaborate with provider on medication access solutions o Attends all scheduled provider appointments o Calls pharmacy for medication refills o Calls provider office for new concerns or questions Follow Up Plan: Telephone follow up appointment with care management team member scheduled for: 2/23 at 8:45 am     The patient verbalized understanding of instructions, educational materials, and care plan provided today and declined offer to receive copy of patient instructions, educational materials, and care plan.    Duanne Moron, PharmD, [facility] Clinical Pharmacist [facility] [employer] [phone]

## 2020-02-20 NOTE — Chronic Care Management (AMB) (Signed)
Chronic Care Management   Pharmacy Note  02/20/2020 Name: Summer Young MRN: [id number] DOB: 12/18/1950  Subjective:  Summer Young is a 70 y.o. year old female who is a primary care patient of [name], Lupita Raider, FNP. The CCM team was consulted for assistance with chronic disease management and care coordination needs.    Engaged with patient by telephone for follow up visit in response to provider referral for pharmacy case management and/or care coordination services.   Consent to Services:  Patient was given information about Chronic Care Management services, agreed to services, and gave verbal consent prior to initiation of services on 09/09/2018. Please see initial visit note for detailed documentation.   Objective:  Lab Results  Component Value Date   CREATININE 0.82 04/12/2019   CREATININE 0.79 06/10/2018   CREATININE 0.87 03/03/2018    Lab Results  Component Value Date   HGBA1C 7.4 (A) 01/18/2020       Component Value Date/Time   CHOL 104 04/12/2019 0818   TRIG 63 04/12/2019 0818   HDL 45 (L) 04/12/2019 0818   CHOLHDL 2.3 04/12/2019 0818   LDLCALC 45 04/12/2019 0818     BP Readings from Last 3 Encounters:  01/18/20 137/70  10/28/19 120/70  09/19/19 124/74    Assessment/Interventions: Review of patient past medical history, allergies, medications, health status, including review of consultants reports, laboratory and other test data, was performed as part of comprehensive evaluation and provision of chronic care management services.   SDOH (Social Determinants of Health) assessments and interventions performed:    CCM Care Plan  Allergies  Allergen Reactions  . Aspirin Hives    Other reaction(s): Other (See Comments) Other Reaction: Not Assessed   . Insulin Detemir Hives and Rash    Errythema, edema, heat at site of injection did not improve after 2 weeks of use.    . Shellfish Allergy Anaphylaxis  . Dapagliflozin Other (See Comments)    Yeast  infections Yeast infection Farxiga  . Influenza Vaccines Other (See Comments)    Forxiga  . Other     Other reaction(s): Other (See Comments) Uncoded Allergy. Allergen: Shellfish, Other Reaction: Not Assessed    Flu vaccine given 1998 caused anaphylaxis    Medications Reviewed Today    Reviewed by Vella Raring, [place] (Pharmacist) on 01/23/20 at 1056  Med List Status: <None>  Medication Order Taking? Sig Documenting Provider Last Dose Status Informant  alendronate (FOSAMAX) 70 MG tablet [id number]  Take 1 tablet (70 mg total) by mouth once a week. Take with a full glass of water on an empty stomach. [name], FNP  Active   Ascorbic Acid (VITAMIN C) 1000 MG tablet [id number]  Take 1,000 mg by mouth daily as needed.  [provider]  Active            Med Note Winfield Cunas, [facility] A   Mon Sep 20, 2018  4:05 PM) As needed to prevent illness  atorvastatin (LIPITOR) 40 MG tablet [id number] Yes Take 1 tablet by mouth once daily [name], FNP Taking Active   Biotin 1000 MCG tablet [id number]  Take 4,000 mcg by mouth every other day.  [provider]  Active Self           Med Note Bar[facility] Ellison   Tue Jan 19, 2018 11:31 AM) [name] every other day  blood glucose meter kit and supplies [id number]  Dispense based on patient and insurance preference. Use up to four times daily as  directed. (FOR ICD-10 E10.9, E11.9). [name], FNP  Active   Cholecalciferol (VITAMIN D3) 50 MCG (2000 UT) capsule [id number]  Take 1,000 Units by mouth daily.  [provider]  Active   diclofenac Sodium (VOLTAREN) 1 % GEL [id number]  Apply 2 g topically 3 (three) times daily as needed. Karamalegos, Devonne Doughty, DO  Active   insulin degludec (TRESIBA FLEXTOUCH) 100 UNIT/ML SOPN FlexTouch Pen [id number] Yes Inject 0.26 mLs (26 Units total) into the skin daily.  Patient taking differently: Inject 20 Units into the skin daily.    [name], NP Taking Active   meloxicam  (MOBIC) 15 MG tablet [id number]  Take 1 tablet (15 mg total) by mouth daily as needed for pain. Olin Hauser, DO  Active   metFORMIN (GLUCOPHAGE-XR) 500 MG 24 hr tablet [id number] Yes TAKE 3 TABLETS BY MOUTH ONCE DAILY WITH BREAKFAST Malfi, Lupita Raider, FNP Taking Active   OZEMPIC, 1 MG/DOSE, 2 MG/1.5ML SOPN [id number] Yes INJECT 1 MG SUBCUTANEOUSLY ONCE A WEEK Malfi, Lupita Raider, FNP Taking Active   polyethylene glycol powder (GLYCOLAX/MIRALAX) 17 GM/SCOOP powder [id number]  Take 17 g by mouth 2 (two) times daily as needed. [name], FNP  Active   spironolactone (ALDACTONE) 25 MG tablet [id number]  Take 1 tablet by mouth once daily Malfi, Lupita Raider, FNP  Active   vitamin B-12 (CYANOCOBALAMIN) 1000 MCG tablet [id number]  Take 1,000 mcg by mouth daily.  [provider]  Active   vitamin E 400 UNIT capsule [id number]  Take by mouth. [provider]  Active           Patient Active Problem List   Diagnosis Date Noted  . Cough 01/18/2020  . Fever 01/18/2020  . Acute back pain 10/28/2019  . Immunization due 09/19/2019  . Osteoporosis 08/16/2019  . Screening for osteoporosis 06/16/2019  . Screening for breast cancer 06/16/2019  . Fatigue 05/03/2019  . 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 Allergic rhinitis 02/26/2018  . Hyperlipidemia 02/26/2018  . Diabetes mellitus ([place]) 02/26/2018  . Abdominal pain 02/26/2018  . Cataract cortical, senile 01/18/2018  . Seasonal allergic rhinitis due to pollen 07/24/2016  . Chronic bilateral low back pain without sciatica 11/22/2014  . Chronic pain of both knees 11/22/2014  . Essential hypertension 11/22/2014  . Dyslipidemia associated with type 2 diabetes mellitus ([place]) 11/22/2014  . Type 2 diabetes mellitus with hyperglycemia ([place]) 01/19/2013    Conditions to be addressed/monitored: HLD and DMII  Patient Care Plan: General Pharmacy (Adult)    Problem Identified: Disease Progression      Long-Range Goal: Disease Progression Prevented or Minimized   Start Date: 01/23/2020  Expected End Date: 04/22/2020  Recent Progress: On track  Priority: High  Note:   Current Barriers:  . Unable to independently afford treatment regimen o Patient approved for [place] and Ozempic medication assistance from [employer] through 01/17/2020 o Need for re-enrollment in medication assistance program for 2022 calendar year  Pharmacist Clinical Goal(s):  Marland Kitchen Over the next 90 days, patient will achieve control of T2DM as evidenced by A1C <7.0%. through collaboration with PharmD and provider.  . Over the next 90 days, patient will maintain control of LDL as evidenced by LDL <70 mg/dL through collaboration with PharmD and provider.  . Over the next 90 days, patient will verbalize ability to afford treatment regimen. through collaboration with PharmD and provider.  Interventions: . Inter-disciplinary care team collaboration (see longitudinal plan of care)  Diabetes: . Current treatment: o Tresiba 20 units once daily o Metformin ER 500 mg - 3 tablets daily with breakfast o Ozempic 1 mg once weekly . Current glucose readings: fasting glucose ranging: 93-114 o Denies hypoglycemic symptoms o Note patient carries glucose tablets with her . Discussed importance of regular and well-balanced meals, while limiting carbohydrate portion sizes o Reports has done well with limiting her carbohydrate portion sizes over the holidays . Patient confirms has >1.5 months supply of Tresiba and Ozempic remaining from assistance program . Will continue to collaborate with [facility] CPhT for assistance with re-enrollment in [employer] patient assistance program for 2022 calendar year o Note [facility] CPh[facility] mailed application to program on 12/16 . Patient to call clinic for any new/worsening medical concerns  Hyperlipidemia: . Controlled; current treatment: atorvastatin 40 mg daily  . Have counseled patient on importance of  medication adherence . Have discussed dietary management  Patient Goals/Self-Care Activities . Over the next 90 days, patient will:  o Take medications as prescribed o Check blood glucose daily, document, and provide at future appointments - Keeps log in App on phone o Collaborate with provider on medication access solutions o Attends all scheduled provider appointments o Calls pharmacy for medication refills o Calls provider office for new concerns or questions Follow Up Plan: Telephone follow up appointment with care management team member scheduled for: 2/23 at 8:45 am     [name], PharmD, [place] [phone]

## 2020-02-23 ENCOUNTER — Telehealth: Payer: Self-pay

## 2020-02-27 ENCOUNTER — Encounter: Payer: Self-pay | Admitting: Family Medicine

## 2020-03-07 ENCOUNTER — Ambulatory Visit: Payer: PPO | Admitting: Pharmacist

## 2020-03-07 DIAGNOSIS — E1165 Type 2 diabetes mellitus with hyperglycemia: Secondary | ICD-10-CM

## 2020-03-07 NOTE — Chronic Care Management (AMB) (Signed)
Chronic Care Management Pharmacy Note  03/07/2020 Name:  Summer Young MRN:  [id number] DOB:  09/22/1950  Subjective: Summer Young is an 70 y.o. year old female who is a primary patient of Malfi, Lupita Raider, FNP.  The CCM team was consulted for assistance with disease management and care coordination needs.    Engaged with patient by telephone for follow up visit in response to provider referral for pharmacy case management and/or care coordination services.   Consent to Services:  The patient was given information about Chronic Care Management services, agreed to services, and gave verbal consent prior to initiation of services.  Please see initial visit note for detailed documentation.   Objective:  Lab Results  Component Value Date   CREATININE 0.82 04/12/2019   CREATININE 0.79 06/10/2018   CREATININE 0.87 03/03/2018    Lab Results  Component Value Date   HGBA1C 7.4 (A) 01/18/2020    Assessment: Review of patient past medical history, allergies, medications, health status, including review of consultants reports, laboratory and other test data, was performed as part of comprehensive evaluation and provision of chronic care management services.   SDOH:  (Social Determinants of Health) assessments and interventions performed:    CCM Care Plan  Allergies  Allergen Reactions  . Aspirin Hives    Other reaction(s): Other (See Comments) Other Reaction: Not Assessed   . Insulin Detemir Hives and Rash    Errythema, edema, heat at site of injection did not improve after 2 weeks of use.    . Shellfish Allergy Anaphylaxis  . Dapagliflozin Other (See Comments)    Yeast infections Yeast infection Farxiga  . Influenza Vaccines Other (See Comments)    Forxiga  . Other     Other reaction(s): Other (See Comments) Uncoded Allergy. Allergen: Shellfish, Other Reaction: Not Assessed    Flu vaccine given 1998 caused anaphylaxis    Medications Reviewed Today    Reviewed by Vella Raring, [place] (Pharmacist) on 01/23/20 at 1056  Med List Status: <None>  Medication Order Taking? Sig Documenting Provider Last Dose Status Informant  alendronate (FOSAMAX) 70 MG tablet [id number]  Take 1 tablet (70 mg total) by mouth once a week. Take with a full glass of water on an empty stomach. [name], FNP  Active   Ascorbic Acid (VITAMIN C) 1000 MG tablet [id number]  Take 1,000 mg by mouth daily as needed.  [provider]  Active            Med Note Winfield Cunas, [facility] A   Mon Sep 20, 2018  4:05 PM) As needed to prevent illness  atorvastatin (LIPITOR) 40 MG tablet [id number] Yes Take 1 tablet by mouth once daily [name], FNP Taking Active   Biotin 1000 MCG tablet [id number]  Take 4,000 mcg by mouth every other day.  [provider]  Active Self           Med Note Barrington Ellison   Tue Jan 19, 2018 11:31 AM) [name] every other day  blood glucose meter kit and supplies [id number]  Dispense based on patient and insurance preference. Use up to four times daily as directed. (FOR ICD-10 E10.9, E11.9). [name], FNP  Active   Cholecalciferol (VITAMIN D3) 50 MCG (2000 UT) capsule [id number]  Take 1,000 Units by mouth daily.  [provider]  Active   diclofenac Sodium (VOLTAREN) 1 % GEL [id number]  Apply 2 g topically 3 (three) times daily as needed. [place] Ranger, Devonne Doughty,  DO  Active   insulin degludec (TRESIBA FLEXTOUCH) 100 UNIT/ML SOPN FlexTouch Pen [id number] Yes Inject 0.26 mLs (26 Units total) into the skin daily.  Patient taking differently: Inject 20 Units into the skin daily.    [name], NP Taking Active   meloxicam (MOBIC) 15 MG tablet [id number]  Take 1 tablet (15 mg total) by mouth daily as needed for pain. Olin Hauser, DO  Active   metFORMIN (GLUCOPHAGE-XR) 500 MG 24 hr tablet [id number] Yes TAKE 3 TABLETS BY MOUTH ONCE DAILY WITH BREAKFAST Malfi, Lupita Raider, FNP Taking Active   OZEMPIC, 1 MG/DOSE, 2 MG/1.5ML SOPN  [id number] Yes INJECT 1 MG SUBCUTANEOUSLY ONCE A WEEK Malfi, Lupita Raider, FNP Taking Active   polyethylene glycol powder (GLYCOLAX/MIRALAX) 17 GM/SCOOP powder [id number]  Take 17 g by mouth 2 (two) times daily as needed. [name], FNP  Active   spironolactone (ALDACTONE) 25 MG tablet [id number]  Take 1 tablet by mouth once daily Malfi, Lupita Raider, FNP  Active   vitamin B-12 (CYANOCOBALAMIN) 1000 MCG tablet [id number]  Take 1,000 mcg by mouth daily.  [provider]  Active   vitamin E 400 UNIT capsule [id number]  Take by mouth. [provider]  Active           Patient Active Problem List   Diagnosis Date Noted  . Cough 01/18/2020  . Fever 01/18/2020  . Acute back pain 10/28/2019  . Immunization due 09/19/2019  . Osteoporosis 08/16/2019  . Screening for osteoporosis 06/16/2019  . Screening for breast cancer 06/16/2019  . Fatigue 05/03/2019  . 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 Allergic rhinitis 02/26/2018  . Hyperlipidemia 02/26/2018  . Diabetes mellitus ([place]) 02/26/2018  . Abdominal pain 02/26/2018  . Cataract cortical, senile 01/18/2018  . Seasonal allergic rhinitis due to pollen 07/24/2016  . Chronic bilateral low back pain without sciatica 11/22/2014  . Chronic pain of both knees 11/22/2014  . Essential hypertension 11/22/2014  . Dyslipidemia associated with type 2 diabetes mellitus ([place]) 11/22/2014  . Type 2 diabetes mellitus with hyperglycemia ([place]) 01/19/2013    Conditions to be addressed/monitored: HTN, HLD and DMII  Care Plan : General Pharmacy (Adult)  Updates made by Vella Raring, [place] since 03/07/2020 12:00 AM    Problem: Disease Progression     Long-Range Goal: Disease Progression Prevented or Minimized   Start Date: 01/23/2020  Expected End Date: 04/22/2020  Recent Progress: On track  Priority: High  Note:   Current Barriers:  . Unable to independently afford treatment regimen o Patient  approved for [place] and Ozempic medication assistance from [employer] through 01/17/2020 o In process of applying for re-enrollment in medication assistance program for 2022 calendar year  Pharmacist Clinical Goal(s):  Marland Kitchen Over the next 90 days, patient will achieve control of T2DM as evidenced by A1C <7.0%. through collaboration with PharmD and provider.  . Over the next 90 days, patient will maintain control of LDL as evidenced by LDL <70 mg/dL through collaboration with PharmD and provider.  . Over the next 90 days, patient will verbalize ability to afford treatment regimen. through collaboration with PharmD and provider.  Interventions: . Inter-disciplinary care team collaboration (see longitudinal plan of care) . Encourage patient to follow up with PCP for evaluation of knee weakness  o Reports she has been having knee weakness for a while and had a fall yesterday that she attributes to this weakness.  o Reports her knee is now  swollen after the fall, but denies any other injuries.  o Denies need to be seen by provider urgently, but states will call to schedule appointment o Will send message to PCP  Medication Assistance . Receive message from [place]. Financial documentation submitted by patient for 2022 Novo Nordisk patient assistance application was from 9914. Patient needs to provide more up to date financial document for application. . Follow up with patient today. Ms. Condon reports she will call her accountant Johnsie Cancel with [place]) to have her 1040 tax form from 2020 faxed to [place] message to [place]  Patient Goals/Self-Care Activities . Over the next 90 days, patient will:  o Take medications as prescribed o Check blood glucose daily, document, and provide at future appointments - Keeps log in App on phone o Collaborate with provider on medication access solutions o Attends all scheduled provider appointments o Calls pharmacy for medication  refills o Calls provider office for new concerns or questions Follow Up Plan: Telephone follow up appointment with care management team member scheduled for: 2/23 at 8:45 am    Follow Up:  Patient agrees to Care Plan and Follow-up.   [name], PharmD, [place] [phone]

## 2020-03-07 NOTE — Patient Instructions (Signed)
Visit Information  Patient Care Plan: General Pharmacy (Adult)    Problem Identified: Disease Progression     Long-Range Goal: Disease Progression Prevented or Minimized   Start Date: 01/23/2020  Expected End Date: 04/22/2020  Recent Progress: On track  Priority: High  Note:   Current Barriers:  . Unable to independently afford treatment regimen o Patient approved for [place] and Ozempic medication assistance from [employer] through 01/17/2020 o In process of applying for re-enrollment in medication assistance program for 2022 calendar year  Pharmacist Clinical Goal(s):  Marland Kitchen Over the next 90 days, patient will achieve control of T2DM as evidenced by A1C <7.0%. through collaboration with PharmD and provider.  . Over the next 90 days, patient will maintain control of LDL as evidenced by LDL <70 mg/dL through collaboration with PharmD and provider.  . Over the next 90 days, patient will verbalize ability to afford treatment regimen. through collaboration with PharmD and provider.  Interventions: . Inter-disciplinary care team collaboration (see longitudinal plan of care) . Encourage patient to follow up with PCP for evaluation of knee weakness  o Reports she has been having knee weakness for a while and had a fall yesterday that she attributes to this weakness.  o Reports her knee is now swollen after the fall, but denies any other injuries.  o Denies need to be seen by provider urgently, but states will call to schedule appointment o Will send message to PCP  Medication Assistance . Receive message from [place]. Financial documentation submitted by patient for 2022 Novo Nordisk patient assistance application was from 1610. Patient needs to provide more up to date financial document for application. . Follow up with patient today. Ms. Hartwell reports she will call her accountant Johnsie Cancel with [place]) to have her 1040 tax form from 2020 faxed to [place] message  to [place]  Patient Goals/Self-Care Activities . Over the next 90 days, patient will:  o Take medications as prescribed o Check blood glucose daily, document, and provide at future appointments - Keeps log in App on phone o Collaborate with provider on medication access solutions o Attends all scheduled provider appointments o Calls pharmacy for medication refills o Calls provider office for new concerns or questions Follow Up Plan: Telephone follow up appointment with care management team member scheduled for: 2/23 at 8:45 am     The patient verbalized understanding of instructions, educational materials, and care plan provided today and declined offer to receive copy of patient instructions, educational materials, and care plan.   [name], PharmD, [place] [phone]

## 2020-03-09 ENCOUNTER — Ambulatory Visit: Payer: Self-pay | Admitting: Pharmacist

## 2020-03-09 DIAGNOSIS — E1165 Type 2 diabetes mellitus with hyperglycemia: Secondary | ICD-10-CM

## 2020-03-09 NOTE — Chronic Care Management (AMB) (Signed)
Chronic Care Management Pharmacy Note  03/09/2020 Name:  Summer Young MRN:  [id number] DOB:  18-Nov-1950  Subjective: Summer Young is an 70 y.o. year old female who is a primary patient of Malfi, Lupita Raider, FNP.  The CCM team was consulted for assistance with disease management and care coordination needs.    Receive a message from Ms. [place] regarding her tax document for her patient assistance application.  Engaged with patient by telephone for follow up visit in response to provider referral for pharmacy case management and/or care coordination services.   Consent to Services:  The patient was given information about Chronic Care Management services, agreed to services, and gave verbal consent prior to initiation of services.  Please see initial visit note for detailed documentation.   SDOH:  (Social Determinants of Health) assessments and interventions performed: None   CCM Care Plan  Allergies  Allergen Reactions   Aspirin Hives    Other reaction(s): Other (See Comments) Other Reaction: Not Assessed    Insulin Detemir Hives and Rash    Errythema, edema, heat at site of injection did not improve after 2 weeks of use.     Shellfish Allergy Anaphylaxis   Dapagliflozin Other (See Comments)    Yeast infections Yeast infection Farxiga   Influenza Vaccines Other (See Comments)    Forxiga   Other     Other reaction(s): Other (See Comments) Uncoded Allergy. Allergen: Shellfish, Other Reaction: Not Assessed    Flu vaccine given 1998 caused anaphylaxis    Medications Reviewed Today    Reviewed by Vella Raring, [place] (Pharmacist) on 01/23/20 at 1056  Med List Status: <None>  Medication Order Taking? Sig Documenting Provider Last Dose Status Informant  alendronate (FOSAMAX) 70 MG tablet [id number]  Take 1 tablet (70 mg total) by mouth once a week. Take with a full glass of water on an empty stomach. [name], FNP  Active   Ascorbic Acid (VITAMIN C) 1000 MG  tablet [id number]  Take 1,000 mg by mouth daily as needed.  [provider]  Active            Med Note [place], [facility]   Mon Sep 20, 2018  4:05 PM) As needed to prevent illness  atorvastatin (LIPITOR) 40 MG tablet [id number] Yes Take 1 tablet by mouth once daily [name], FNP Taking Active   Biotin 1000 MCG tablet [id number]  Take 4,000 mcg by mouth every other day.  [provider]  Active Self           Med Note [facility]   Tue Jan 19, 2018 11:31 AM) [name] every other day  blood glucose meter kit and supplies [id number]  Dispense based on patient and insurance preference. Use up to four times daily as directed. (FOR ICD-10 E10.9, E11.9). [name], FNP  Active   Cholecalciferol (VITAMIN D3) 50 MCG (2000 UT) capsule [id number]  Take 1,000 Units by mouth daily.  [provider]  Active   diclofenac Sodium (VOLTAREN) 1 % GEL [id number]  Apply 2 g topically 3 (three) times daily as needed. Karamalegos, Devonne Doughty, DO  Active   insulin degludec (TRESIBA FLEXTOUCH) 100 UNIT/ML SOPN FlexTouch Pen [id number] Yes Inject 0.26 mLs (26 Units total) into the skin daily.  Patient taking differently: Inject 20 Units into the skin daily.    [name], NP Taking Active   meloxicam (MOBIC) 15 MG tablet [id number]  Take 1 tablet (15 mg total) by  mouth daily as needed for pain. Olin Hauser, DO  Active   metFORMIN (GLUCOPHAGE-XR) 500 MG 24 hr tablet [id number] Yes TAKE 3 TABLETS BY MOUTH ONCE DAILY WITH BREAKFAST Malfi, Lupita Raider, FNP Taking Active   OZEMPIC, 1 MG/DOSE, 2 MG/1.5ML SOPN [id number] Yes INJECT 1 MG SUBCUTANEOUSLY ONCE A WEEK Malfi, Lupita Raider, FNP Taking Active   polyethylene glycol powder (GLYCOLAX/MIRALAX) 17 GM/SCOOP powder [id number]  Take 17 g by mouth 2 (two) times daily as needed. [name], FNP  Active   spironolactone (ALDACTONE) 25 MG tablet [id number]  Take 1 tablet by mouth once daily Malfi, Lupita Raider, FNP  Active   vitamin  B-12 (CYANOCOBALAMIN) 1000 MCG tablet [id number]  Take 1,000 mcg by mouth daily.  [provider]  Active   vitamin E 400 UNIT capsule [id number]  Take by mouth. 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/24/2019   Allergic rhinitis 02/26/2018   Hyperlipidemia 02/26/2018   Diabetes mellitus ([place]) 02/26/2018   Abdominal pain 02/26/2018   Cataract cortical, senile 01/18/2018   Seasonal allergic rhinitis due to pollen 07/24/2016   Chronic bilateral low back pain without sciatica 11/22/2014   Chronic pain of both knees 11/22/2014   Essential hypertension 11/22/2014   Dyslipidemia associated with type 2 diabetes mellitus ([place]) 11/22/2014   Type 2 diabetes mellitus with hyperglycemia ([place]) 01/19/2013    Conditions to be addressed/monitored: HLD and DMII  Care Plan : General Pharmacy (Adult)  Updates made by Vella Raring, [place] since 03/09/2020 12:00 AM    Problem: Disease Progression     Long-Range Goal: Disease Progression Prevented or Minimized   Start Date: 01/23/2020  Expected End Date: 04/22/2020  Recent Progress: On track  Priority: High  Note:   Current Barriers:   Unable to independently afford treatment regimen o Patient approved for [place] and Ozempic medication assistance from [employer] through 01/17/2020 o In process of applying for re-enrollment in medication assistance program for 2022 calendar year  Pharmacist Clinical Goal(s):   Over the next 90 days, patient will achieve control of T2DM as evidenced by A1C <7.0%. through collaboration with PharmD and provider.   Over the next 90  days, patient will maintain control of LDL as evidenced by LDL <70 mg/dL through collaboration with PharmD and provider.   Over the next 90 days, patient will verbalize ability to afford treatment regimen. through collaboration with PharmD and provider.  Interventions:  Inter-disciplinary care team collaboration (see longitudinal plan of care)  Medication Assistance  Receive a message from Ms. [place] regarding her tax document for her patient assistance application. Reports that neither she or her accountant have access to a fax machine to send financial document o Provide patient with contact info for secure email document to [place] confirms receipt of document via email  Will continue to collaborate with re-enrollment for patient to receive [place] and Ozempic from [employer] patient assistance program for 2022 calendar year  Patient Goals/Self-Care Activities  Over the next 90 days, patient will:  o Take medications as prescribed o Check blood glucose daily, document,  and provide at future appointments - Keeps log in App on phone o Collaborate with provider on medication access solutions o Attends all scheduled provider appointments o Calls pharmacy for medication refills o Calls provider office for new concerns or questions Follow Up Plan: Telephone follow up appointment with care management team member scheduled for: 2/23 at 8:45 am   Long-Range Goal: Patient Assistance   Start Date: 03/09/2020  Expected End Date: 05/08/2020  This Visit's Progress: On track  Priority: High  Note:   Work with provider and provide documents for re-enrollment in medication assistance program for [place] and Ozempic from [employer] for  for 2022 calendar year    Follow Up:  Patient agrees to Care Plan and Follow-up.  [name], PharmD, [place] [phone]

## 2020-03-09 NOTE — Patient Instructions (Addendum)
  Visit Information   The patient verbalized understanding of instructions, educational materials, and care plan provided today and declined offer to receive copy of patient instructions, educational materials, and care plan.   Telephone follow up appointment with care management team member scheduled for: 04/11/2020 at 8:45 AM  [name], PharmD, [place] [phone]

## 2020-03-27 ENCOUNTER — Ambulatory Visit: Payer: Self-pay | Admitting: Licensed Clinical Social Worker

## 2020-03-27 NOTE — Chronic Care Management (AMB) (Signed)
  Care Management   Follow Up Note   03/27/2020 Name: Summer Young MRN: [id number] DOB: 08-17-1950   Referred by: [name], FNP Reason for referral : [name] is a 70 y.o. year old female who is a primary care patient of [name], FNP. The care management team was consulted for assistance with care management and care coordination needs.    Review of patient status, including review of consultants reports, relevant laboratory and other test results, and collaboration with appropriate care team members and the patient's provider was performed as part of comprehensive patient evaluation and provision of chronic care management services.    CCM LCSW completed successful outreach to patient on 03/27/20. Patient questioned if appointment could rescheduled for a later date as she is unable to talk over the phone today. CCM LCSW successfully rescheduled appointment within 14 days.  Eula Fried, BSW, MSW, [email or web address] Phone: [phone]

## 2020-03-28 ENCOUNTER — Ambulatory Visit (INDEPENDENT_AMBULATORY_CARE_PROVIDER_SITE_OTHER): Payer: PPO | Admitting: Licensed Clinical Social Worker

## 2020-03-28 DIAGNOSIS — F419 Anxiety disorder, unspecified: Secondary | ICD-10-CM

## 2020-03-28 DIAGNOSIS — R5383 Other fatigue: Secondary | ICD-10-CM

## 2020-03-28 DIAGNOSIS — E1165 Type 2 diabetes mellitus with hyperglycemia: Secondary | ICD-10-CM

## 2020-03-28 DIAGNOSIS — E782 Mixed hyperlipidemia: Secondary | ICD-10-CM

## 2020-03-28 DIAGNOSIS — I10 Essential (primary) hypertension: Secondary | ICD-10-CM

## 2020-03-28 NOTE — Chronic Care Management (AMB) (Signed)
Chronic Care Management    Clinical Social Work Note  03/28/2020 Name: Channing Savich MRN: [id number] DOB: 1950/09/21  Christiona Siddique is a 70 y.o. year old female who is a primary care patient of [name], Lupita Raider, FNP. The CCM team was consulted to assist the patient with chronic disease management and/or care coordination needs related to: Mental Health Counseling and Resources.   Engaged with patient by telephone for follow up visit in response to provider referral for social work chronic care management and care coordination services.   Consent to Services:  The patient was given the following information about Chronic Care Management services today, agreed to services, and gave verbal consent: 1. CCM service includes personalized support from designated clinical staff supervised by the primary care provider, including individualized plan of care and coordination with other care providers 2. 24/7 contact phone numbers for assistance for urgent and routine care needs. 3. Service will only be billed when office clinical staff spend 20 minutes or more in a month to coordinate care. 4. Only one practitioner may furnish and bill the service in a calendar month. 5.The patient may stop CCM services at any time (effective at the end of the month) by phone call to the office staff. 6. The patient will be responsible for cost sharing (co-pay) of up to 20% of the service fee (after annual deductible is met). Patient agreed to services and consent obtained.  Patient agreed to services and consent obtained.   Assessment: Review of patient past medical history, allergies, medications, and health status, including review of relevant consultants reports was performed today as part of a comprehensive evaluation and provision of chronic care management and care coordination services.     SDOH (Social Determinants of Health) assessments and interventions performed:    Advanced Directives Status: See Care Plan for  related entries.  CCM Care Plan  Allergies  Allergen Reactions  . Aspirin Hives    Other reaction(s): Other (See Comments) Other Reaction: Not Assessed   . Insulin Detemir Hives and Rash    Errythema, edema, heat at site of injection did not improve after 2 weeks of use.    . Shellfish Allergy Anaphylaxis  . Dapagliflozin Other (See Comments)    Yeast infections Yeast infection Farxiga  . Influenza Vaccines Other (See Comments)    Forxiga  . Other     Other reaction(s): Other (See Comments) Uncoded Allergy. Allergen: Shellfish, Other Reaction: Not Assessed    Flu vaccine given 1998 caused anaphylaxis    Outpatient Encounter Medications as of 03/28/2020  Medication Sig Note  . alendronate (FOSAMAX) 70 MG tablet Take 1 tablet (70 mg total) by mouth once a week. Take with a full glass of water on an empty stomach.   . Ascorbic Acid (VITAMIN C) 1000 MG tablet Take 1,000 mg by mouth daily as needed.  09/20/2018: As needed to prevent illness  . atorvastatin (LIPITOR) 40 MG tablet Take 1 tablet by mouth once daily   . Biotin 1000 MCG tablet Take 4,000 mcg by mouth every other day.  01/19/2018: Takes every other day  . blood glucose meter kit and supplies Dispense based on patient and insurance preference. Use up to four times daily as directed. (FOR ICD-10 E10.9, E11.9).   Marland Kitchen Cholecalciferol (VITAMIN D3) 50 MCG (2000 UT) capsule Take 1,000 Units by mouth daily.    . diclofenac Sodium (VOLTAREN) 1 % GEL Apply 2 g topically 3 (three) times daily as needed.   . insulin degludec (  TRESIBA FLEXTOUCH) 100 UNIT/ML SOPN FlexTouch Pen Inject 0.26 mLs (26 Units total) into the skin daily. (Patient taking differently: Inject 20 Units into the skin daily.)   . meloxicam (MOBIC) 15 MG tablet Take 1 tablet (15 mg total) by mouth daily as needed for pain.   . metFORMIN (GLUCOPHAGE-XR) 500 MG 24 hr tablet TAKE 3 TABLETS BY MOUTH ONCE DAILY WITH BREAKFAST   . OZEMPIC, 1 MG/DOSE, 2 MG/1.5ML SOPN INJECT 1 MG  SUBCUTANEOUSLY ONCE A WEEK   . polyethylene glycol powder (GLYCOLAX/MIRALAX) 17 GM/SCOOP powder Take 17 g by mouth 2 (two) times daily as needed.   Marland Kitchen spironolactone (ALDACTONE) 25 MG tablet Take 1 tablet by mouth once daily   . vitamin B-12 (CYANOCOBALAMIN) 1000 MCG tablet Take 1,000 mcg by mouth daily.    . vitamin E 400 UNIT capsule Take by mouth.    No facility-administered encounter medications on file as of 03/28/2020.    Patient Active Problem List   Diagnosis Date Noted  . Cough 01/18/2020  . Fever 01/18/2020  . Acute back pain 10/28/2019  . Immunization due 09/19/2019  . Osteoporosis 08/16/2019  . Screening for osteoporosis 06/16/2019  . Screening for breast cancer 06/16/2019  . Fatigue 05/03/2019  . 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 Allergic rhinitis 02/26/2018  . Hyperlipidemia 02/26/2018  . Diabetes mellitus ([place]) 02/26/2018  . Abdominal pain 02/26/2018  . Cataract cortical, senile 01/18/2018  . Seasonal allergic rhinitis due to pollen 07/24/2016  . Chronic bilateral low back pain without sciatica 11/22/2014  . Chronic pain of both knees 11/22/2014  . Essential hypertension 11/22/2014  . Dyslipidemia associated with type 2 diabetes mellitus ([place]) 11/22/2014  . Type 2 diabetes mellitus with hyperglycemia ([place]) 01/19/2013    Conditions to be addressed/monitored: Anxiety and Depression; Mental Health Concerns   Care Plan : General Social Work (Adult)  Updates made by Greg Cutter, LCSW since 03/28/2020 12:00 AM    Problem: Depression Identification (Depression)     Long-Range Goal: Depressive Symptoms Identified   Start Date: 01/24/2020  Recent Progress: On track  Priority: Medium  Note:   Evidence-based guidance:   Identify risk for depression by reviewing presenting symptoms and risk factors.   Review use of medications that contribute to depression such as steroid, narcotic, sedative, antihypertensive, beta  blocker, cytoxic agent.   Review related metabolic processes, including infection, anemia, thyroid dysfunction, kidney failure, heart failure, alcohol or substance use.   Perform depression screening using standardized tools to obtain baseline intensity of depressive symptoms.   Perform or refer for a full diagnostic interview when positive screening results are noted; use DSM-5 criteria to determine appropriate diagnosis (e.g., major depression, persistent depressive disorder, unspecified depressive    disorder).   Notes:   Timeframe:  Long-Range Goal Priority:  Medium  Start Date:    03/28/20                      Expected End Date:   06/25/20               Follow Up Date -within 60 days of 03/28/20   - avoid negative self-talk - develop a personal safety plan - develop a plan to deal with triggers like holidays, anniversaries - exercise at least 2 to 3 times per week - have a plan for how to handle bad days - journal feelings and what helps to feel better or worse - spend time or talk  with others at least 2 to 3 times per week - spend time or talk with others every day - watch for early signs of feeling worse - write in journal every day    Why is this important?    Keeping track of your progress will help your treatment team find the right mix of medicine and therapy for you.   Write in your journal every day.   Day-to-day changes in depression symptoms are normal. It may be more helpful to check your progress at the end of each week instead of every day.     Current Barriers:  . Acute Mental Health needs related to anxiety/stress . Limited social support . Mental Health Concerns  . Family and relationship dysfunction . Social Isolation . Suicidal Ideation/Homicidal Ideation: No  Clinical Social Work Goal(s):  Marland Kitchen Over the next 120 days, patient will work with SW bi-monthly by telephone or in person to reduce or manage symptoms related to anxiety  . Over the next 120 days,  patient will work with SW to address concerns related to gaining mental health support and develop better coping skills to manage symptoms  Interventions: . Patient interviewed and appropriate assessments performed: GAD 7 and PHQ 9. Scores have improved drastically overt the past few months. Patient reported that she went on a vacation during the summer to the [place] and was able to spend time with family which increased her ability to cope with anxiety/stress/triggers more effectively.  . 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 Patient shares that she will go there several times per week to gain socialization, relaxation from massage chair and exercise. Positive reinforcement provided for this self-care implementation.  . Patient shares that she wishes to find a local church in order to gain additional positive support. She has been invited to several churches but they are too far of a drive. Patient wishes to disconnect from all negative influences in her life. LCSW provided education on healthy self-care and boundary implementation.  . Patient shares that she is effectively implementing healthy eating habits into her daily routine to increase her self-care. Positive reinforcement provided for this achievement.  Marland Kitchen LCSW provided education on relaxation techniques such as meditation, deep breathing, massage, or yoga that can activate the body's relaxation response and ease symptoms of acute anxiety. LCSW ask that when pt is struggling with difficult emotions and stress that she start this relaxation response process.  . Discussed plans with patient for ongoing care management follow up and provided patient with direct contact information for care management team . Advised patient to contact CCM program for any urgent case management needs . Assisted patient/caregiver with obtaining information about health plan benefits . A voluntary and extensive discussion about  advanced care planning including explanation and discussion of advanced was undertaken with the patient. Explanation regarding healthcare proxy and living will was reviewed and packet with forms with explanation of how to fill them out was given.   . Encouraged patient to consider long term follow up therapy/counseling . Brief CBTprovided during session  . LCSW has sent a secure email with mental health resources during a previous outreach. LCSW provided brief mental health resource education during session but patient again denies wanting or needing to pursue these resources at this time but was appreciative of the resource education.  . Patient reports that she is currently at work in [facility]. 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 She reports that she is trying to build back up her clients and hours.  . Patient reports that she has been actively talking to her support network which includes both of her sisters.  Marland Kitchen CCM Pharmacist informed CCM LCSW that patient was having a difficult time with her depression. Patient was appreciative of phone and coping skill education provided. Patient LCSW discussed coping skills for anxiety. SW used empathetic and active and reflective listening, validated patient's feelings/concerns, and provided emotional support. LCSW provided self-care examples to help patient manage their multiple health conditions and improve her mood.  . Patient shares that she is no longer attending the church she recently found because they were not following the mask mandate. Patient shares that she has a found a small support group that offers ministry. She plans to start attending this service soon. . Patient shares that she was recently prescribed Ambien to treat her insomnia. Patient reports that she is actively working on her sleep routine. LCSW provided education on healthy sleep hygiene and what  that looks like. LCSW encouraged patient to implement a night time routine into her schedule that works best for her and that she is able to maintain. Advised patient to implement deep breathing/grounding/meditation/self-care exercises into her nightly routine to combat racing thoughts at night. LCSW encouraged patient to wake up at the same time each day, make her sleeping environment comfortable, exercise when able, to limit naps and to not eat or drink anything right before bed.   Patient Self Care Activities:  . Attends all scheduled provider appointments  Patient Coping Strengths:  . Spirituality . Hopefulness  Patient Self Care Deficits:  . Lack of socialization and strong support network  Please see past updates related to this goal by clicking on the "Past Updates" button in the selected goal        Follow Up Plan: SW will follow up with patient by phone over the next 60 days      Eula Fried, [employer], MSW, [email or web address] Phone: [phone]

## 2020-04-10 ENCOUNTER — Other Ambulatory Visit: Payer: Self-pay

## 2020-04-10 DIAGNOSIS — E1165 Type 2 diabetes mellitus with hyperglycemia: Secondary | ICD-10-CM

## 2020-04-10 MED ORDER — OZEMPIC (1 MG/DOSE) 2 MG/1.5ML ~~LOC~~ SOPN: PEN_INJECTOR | SUBCUTANEOUS | 0 refills | Status: DC

## 2020-04-11 ENCOUNTER — Ambulatory Visit: Payer: Self-pay | Admitting: Pharmacist

## 2020-04-11 ENCOUNTER — Other Ambulatory Visit: Payer: Self-pay | Admitting: Family Medicine

## 2020-04-11 DIAGNOSIS — I10 Essential (primary) hypertension: Secondary | ICD-10-CM | POA: Diagnosis not present

## 2020-04-11 DIAGNOSIS — E1165 Type 2 diabetes mellitus with hyperglycemia: Secondary | ICD-10-CM | POA: Diagnosis not present

## 2020-04-11 DIAGNOSIS — E782 Mixed hyperlipidemia: Secondary | ICD-10-CM | POA: Diagnosis not present

## 2020-04-11 DIAGNOSIS — M810 Age-related osteoporosis without current pathological fracture: Secondary | ICD-10-CM

## 2020-04-11 MED ORDER — ALENDRONATE SODIUM 70 MG PO TABS: 70.0000 mg | ORAL_TABLET | ORAL | 1 refills | Status: DC

## 2020-04-11 NOTE — Patient Instructions (Signed)
Visit Information  PATIENT GOALS: Goals Addressed            This Visit's Progress   . Pharmacy Goals       Our goal A1c is less than 7%. This corresponds with fasting sugars less than 130 and 2 hour after meal sugars less than 180. Please check your blood sugars and keep log of results  Our goal bad cholesterol, or LDL, is less than 70 . This is why it is important to continue taking your atorvastatin  Please check your home blood pressure, keep a log of the results and bring this with you to your medical appointments.  Feel free to call me with any questions or concerns. I look forward to our next call!   [name], PharmD, [place] [phone]        The patient verbalized understanding of instructions, educational materials, and care plan provided today and declined offer to receive copy of patient instructions, educational materials, and care plan.   Telephone follow up appointment with care management team member scheduled for:  06/06/2020 at 8:30 AM  [name], PharmD, [place] [phone]

## 2020-04-11 NOTE — Chronic Care Management (AMB) (Signed)
Chronic Care Management Pharmacy Note  04/11/2020 Name:  Summer Young MRN:  [id number] DOB:  25-Oct-1950  Subjective: Summer Young is an 70 y.o. year old female who is a primary patient of Malfi, Lupita Raider, FNP.  The CCM team was consulted for assistance with disease management and care coordination needs.    Engaged with patient by telephone for follow up visit in response to provider referral for pharmacy case management and/or care coordination services.   Consent to Services:  The patient was given information about Chronic Care Management services, agreed to services, and gave verbal consent prior to initiation of services.  Please see initial visit note for detailed documentation.   Objective:  Lab Results  Component Value Date   CREATININE 0.82 04/12/2019   CREATININE 0.79 06/10/2018   CREATININE 0.87 03/03/2018    Lab Results  Component Value Date   HGBA1C 7.4 (A) 01/18/2020       Component Value Date/Time   CHOL 104 04/12/2019 0818   TRIG 63 04/12/2019 0818   HDL 45 (L) 04/12/2019 0818   CHOLHDL 2.3 04/12/2019 0818   LDLCALC 45 04/12/2019 0818     BP Readings from Last 3 Encounters:  01/18/20 137/70  10/28/19 120/70  09/19/19 124/74    Assessment: Review of patient past medical history, allergies, medications, health status, including review of consultants reports, laboratory and other test data, was performed as part of comprehensive evaluation and provision of chronic care management services.   SDOH:  (Social Determinants of Health) assessments and interventions performed: none   CCM Care Plan  Allergies  Allergen Reactions  . Aspirin Hives    Other reaction(s): Other (See Comments) Other Reaction: Not Assessed   . Insulin Detemir Hives and Rash    Errythema, edema, heat at site of injection did not improve after 2 weeks of use.    . Shellfish Allergy Anaphylaxis  . Dapagliflozin Other (See Comments)    Yeast infections Yeast  infection Farxiga  . Influenza Vaccines Other (See Comments)    Forxiga  . Other     Other reaction(s): Other (See Comments) Uncoded Allergy. Allergen: Shellfish, Other Reaction: Not Assessed    Flu vaccine given 1998 caused anaphylaxis    Medications Reviewed Today    Reviewed by Vella Raring, [facility] (Pharmacist) on 04/11/20 at 1342  Med List Status: <None>  Medication Order Taking? Sig Documenting Provider Last Dose Status Informant  alendronate (FOSAMAX) 70 MG tablet [id number] No Take 1 tablet (70 mg total) by mouth once a week. Take with a full glass of water on an empty stomach. [name], FNP Taking Active   Ascorbic Acid (VITAMIN C) 1000 MG tablet [id number] No Take 1,000 mg by mouth daily as needed.  [provider] Taking Active            Med Note Winfield Cunas, [facility]   Mon Sep 20, 2018  4:05 PM) As needed to prevent illness  atorvastatin (LIPITOR) 40 MG tablet [id number] No Take 1 tablet by mouth once daily [name], FNP Taking Active   Biotin 1000 MCG tablet [id number] No Take 4,000 mcg by mouth every other day.  [provider] Taking Active Self           Med Note Broadus John, Trude Mcburney   Tue Jan 19, 2018 11:31 AM) [name] every other day  blood glucose meter kit and supplies [id number] No Dispense based on patient and insurance preference. Use up to four times daily  as directed. (FOR ICD-10 E10.9, E11.9). [name], FNP Taking Active   Cholecalciferol (VITAMIN D3) 50 MCG (2000 UT) capsule [id number] No Take 1,000 Units by mouth daily.  [provider] Taking Active   diclofenac Sodium (VOLTAREN) 1 % GEL [id number] No Apply 2 g topically 3 (three) times daily as needed. Olin Hauser, DO Taking Active   insulin degludec (TRESIBA FLEXTOUCH) 100 UNIT/ML SOPN FlexTouch Pen [id number] No Inject 0.26 mLs (26 Units total) into the skin daily.  Patient taking differently: Inject 20 Units into the skin daily.   [name], NP  Taking Active   meloxicam (MOBIC) 15 MG tablet [id number] No Take 1 tablet (15 mg total) by mouth daily as needed for pain. Olin Hauser, DO Taking Active   metFORMIN (GLUCOPHAGE-XR) 500 MG 24 hr tablet [id number] No TAKE 3 TABLETS BY MOUTH ONCE DAILY WITH BREAKFAST Malfi, Lupita Raider, FNP Taking Active   polyethylene glycol powder (GLYCOLAX/MIRALAX) 17 GM/SCOOP powder [id number] No Take 17 g by mouth 2 (two) times daily as needed. Malfi, Lupita Raider, FNP Taking Active   Semaglutide, 1 MG/DOSE, (OZEMPIC, 1 MG/DOSE,) 2 MG/1.5ML SOPN [id number]  INJECT 1 MG SUBCUTANEOUSLY ONCE A WEEK Karamalegos, Devonne Doughty, DO  Active   spironolactone (ALDACTONE) 25 MG tablet [id number] No Take 1 tablet by mouth once daily Malfi, Lupita Raider, FNP Taking Active   vitamin B-12 (CYANOCOBALAMIN) 1000 MCG tablet [id number] No Take 1,000 mcg by mouth daily.  [provider] Taking Active   vitamin E 400 UNIT capsule [id number] No Take by mouth. [provider] Taking Active           Patient Active Problem List   Diagnosis Date Noted  . Cough 01/18/2020  . Fever 01/18/2020  . Acute back pain 10/28/2019  . Immunization due 09/19/2019  . Osteoporosis 08/16/2019  . Screening for osteoporosis 06/16/2019  . Screening for breast cancer 06/16/2019  . Fatigue 05/03/2019  . 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 Allergic rhinitis 02/26/2018  . Hyperlipidemia 02/26/2018  . Diabetes mellitus ([place]) 02/26/2018  . Abdominal pain 02/26/2018  . Cataract cortical, senile 01/18/2018  . Seasonal allergic rhinitis due to pollen 07/24/2016  . Chronic bilateral low back pain without sciatica 11/22/2014  . Chronic pain of both knees 11/22/2014  . Essential hypertension 11/22/2014  . Dyslipidemia associated with type 2 diabetes mellitus ([place]) 11/22/2014  . Type 2 diabetes mellitus with hyperglycemia ([place]) 01/19/2013    Conditions to be addressed/monitored: HLD and  DMII  Care Plan : General Pharmacy (Adult)  Updates made by Vella Raring, [place] since 04/11/2020 12:00 AM    Problem: Disease Progression     Long-Range Goal: Disease Progression Prevented or Minimized   Start Date: 01/23/2020  Expected End Date: 04/22/2020  Recent Progress: On track  Priority: High  Note:   Current Barriers:  . Unable to independently afford treatment regimen o Patient APPROVED for [name] and Ozempic medication assistance from [employer] for 2022 calendar year  Pharmacist Clinical Goal(s):  Marland Kitchen Over the next 90 days, patient will achieve control of T2DM as evidenced by A1C <7.0%. through collaboration with PharmD and provider.  . Over the next 90 days, patient will maintain control of LDL as evidenced by LDL <70 mg/dL through collaboration with PharmD and provider.  . Over the next 90 days, patient will verbalize ability to afford treatment regimen. through collaboration with PharmD and provider.  Interventions: . Inter-disciplinary care team collaboration (see  longitudinal plan of care) . 1:1 collaboration with Olin Hauser, DO regarding development and update of comprehensive plan of care as evidenced by provider attestation and co-signature . Patient requests a new Rx for alendronate be sent to her local pharmacy (out of refills) o Will collaborate with Dr. Parks Ranger  Medication Assistance . Receive voicemail from patient - reports she has not yet received a call about her patient assistance medications coming into office and her supply of Ozempic is running low. . Follow up with [facility] CPhT and office staff. CMA Raquel Sarna confirms supplies of both patient's [place] and Ozempic both received by office and ready for patient to pick up. o Patient confirms she will come by office to pick up . [facility] CPhT reports per [employer] assistance program, patient's next shipment of shipment of [name] and Ozempic will release on 06/03/20 with delivery to the provider's  office in 15-20 business days from that date as patient is set up on auto shipment  Diabetes: . Current treatment: o Tresiba 20 units once daily o Metformin ER 500 mg - 3 tablets daily with breakfast o Ozempic 1 mg once weekly . Current glucose readings: fasting glucose ranging: 107-168 o Attributes higher readings to eating more oranges recently and having more pain in lower back o Denies hypoglycemic symptoms o Note patient carries glucose tablets with her . Discuss importance of regular and well-balanced meals, while limiting carbohydrate portion sizes o Reports will work on limiting oranges and instead eating lower glycemic index fruits, such as apples, with peanut butter for snacks instead   Lower Back Pain: . Reports continues to have lower back pain. Reports planning to discuss further with Dr. Parks Ranger at upcoming visit on 3/14 and denies need for sooner appointment . 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 From review of chart note patient has been seen by PCP for lower back pain on 9/10. PCP discussed treatment options with patient and offered referral to physical therapy if patient interested. . Reports some improvement with taking acetaminophen 500 mg once daily in evening as needed . Reports she is not taking meloxicam as does not feel like it helps the pain . Patient reports will try massages as offered through her gym and working on building up core strength working with [employer] . Encourage patient to try OTC lidocaine patches . Patient denies interest in referral to physical therapy as offered by PCP at this time due to cost (review copayment from health plan website)  Hyperlipidemia:  Controlled; current treatment: atorvastatin 40 mg daily   Have counseled patient on importance of medication adherence  Have discussed dietary management     Patient Goals/Self-Care Activities . Over the next 90 days, patient will:  o Take medications as prescribed o Check blood glucose daily, document, and provide at future appointments - Keeps log in App on phone o Collaborate with provider on medication access solutions o Attends all scheduled provider appointments o Calls pharmacy for medication refills o Calls provider office for new concerns or questions Follow Up Plan: Telephone follow up appointment with care management team member scheduled for: 06/06/2020 at 8:30 AM     Follow Up:  Patient agrees to Care Plan and Follow-up.  [name], PharmD, [place] [phone]

## 2020-04-12 ENCOUNTER — Telehealth: Payer: Self-pay | Admitting: General Practice

## 2020-04-12 ENCOUNTER — Ambulatory Visit: Payer: Self-pay | Admitting: General Practice

## 2020-04-12 DIAGNOSIS — E782 Mixed hyperlipidemia: Secondary | ICD-10-CM | POA: Diagnosis not present

## 2020-04-12 DIAGNOSIS — I10 Essential (primary) hypertension: Secondary | ICD-10-CM | POA: Diagnosis not present

## 2020-04-12 DIAGNOSIS — G8929 Other chronic pain: Secondary | ICD-10-CM

## 2020-04-12 DIAGNOSIS — E1165 Type 2 diabetes mellitus with hyperglycemia: Secondary | ICD-10-CM | POA: Diagnosis not present

## 2020-04-12 NOTE — Patient Instructions (Signed)
Visit Information  PATIENT GOALS: Patient Care Plan: General Pharmacy (Adult)    Problem Identified: Disease Progression     Long-Range Goal: Disease Progression Prevented or Minimized   Start Date: 01/23/2020  Expected End Date: 04/22/2020  Recent Progress: On track  Priority: High  Note:   Current Barriers:  . Unable to independently afford treatment regimen o Patient APPROVED for [place] and Ozempic medication assistance from [employer] for 2022 calendar year  Pharmacist Clinical Goal(s):  Marland Kitchen Over the next 90 days, patient will achieve control of T2DM as evidenced by A1C <7.0%. through collaboration with PharmD and provider.  . Over the next 90 days, patient will maintain control of LDL as evidenced by LDL <70 mg/dL through collaboration with PharmD and provider.  . Over the next 90 days, patient will verbalize ability to afford treatment regimen. through collaboration with PharmD and provider.  Interventions: . Inter-disciplinary care team collaboration (see longitudinal plan of care) . 1:1 collaboration with Smitty Cords, DO regarding development and update of comprehensive plan of care as evidenced by provider attestation and co-signature . Patient requests a new Rx for alendronate be sent to her local pharmacy (out of refills) o Will collaborate with Dr. Althea Charon  Medication Assistance . Receive voicemail from patient - reports she has not yet received a call about her patient assistance medications coming into office and her supply of Ozempic is running low. . Follow up with [facility] CPhT and office staff. CMA Irving Burton confirms supplies of both patient's [place] and Ozempic both received by office and ready for patient to pick up. o Patient confirms she will come by office to pick up . [facility] CPhT reports per [employer] assistance program, patient's next shipment of shipment of [place] and Ozempic will release on 06/03/20 with delivery to the provider's office in 15-20  business days from that date as patient is set up on auto shipment  Diabetes: . Current treatment: o Tresiba 20 units once daily o Metformin ER 500 mg - 3 tablets daily with breakfast o Ozempic 1 mg once weekly . Current glucose readings: fasting glucose ranging: 107-168 o Attributes higher readings to eating more oranges recently and having more pain in lower back o Denies hypoglycemic symptoms o Note patient carries glucose tablets with her . Discuss importance of regular and well-balanced meals, while limiting carbohydrate portion sizes o Reports will work on limiting oranges and instead eating lower glycemic index fruits, such as apples, with peanut butter for snacks instead   Lower Back Pain: . Reports continues to have lower back pain. Reports planning to discuss further with Dr. Althea Charon at upcoming visit on 3/14 and denies need for sooner appointment . 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 From review of chart note patient has been seen by PCP for lower back pain on 9/10. PCP discussed treatment options with patient and offered referral to physical therapy if patient interested. . Reports some improvement with taking acetaminophen 500 mg once daily in evening as needed . Reports she is not taking meloxicam as does not feel like it helps the pain . Patient reports will try massages as offered through her gym and working on building up core strength working with Investment banker, operational . Encourage patient to try OTC lidocaine patches . Patient denies interest in referral to physical therapy as offered by PCP at this time due to  cost (review copayment from health plan website)  Hyperlipidemia:  Controlled; current treatment: atorvastatin 40 mg daily   Have counseled patient on importance of medication adherence  Have discussed dietary management    Patient  Goals/Self-Care Activities . Over the next 90 days, patient will:  o Take medications as prescribed o Check blood glucose daily, document, and provide at future appointments - Keeps log in App on phone o Collaborate with provider on medication access solutions o Attends all scheduled provider appointments o Calls pharmacy for medication refills o Calls provider office for new concerns or questions Follow Up Plan: Telephone follow up appointment with care management team member scheduled for: 06/06/2020 at 8:30 AM   Task: Alleviate Barriers to Chronic Kidney Disease Treatment  Long-Range Goal: Patient Assistance Completed 04/11/2020  Start Date: 03/09/2020  Expected End Date: 05/08/2020  Recent Progress: On track  Priority: High  Note:   Work with provider and provide documents for re-enrollment in medication assistance program for [place] and Ozempic from [employer] for  for 2022 calendar year   Patient Care Plan: General Social Work (Adult)    Problem Identified: Depression Identification (Depression)     Long-Range Goal: Depressive Symptoms Identified   Start Date: 01/24/2020  Recent Progress: On track  Priority: Medium  Note:   Evidence-based guidance:   Identify risk for depression by reviewing presenting symptoms and risk factors.   Review use of medications that contribute to depression such as steroid, narcotic, sedative, antihypertensive, beta blocker, cytoxic agent.   Review related metabolic processes, including infection, anemia, thyroid dysfunction, kidney failure, heart failure, alcohol or substance use.   Perform depression screening using standardized tools to obtain baseline intensity of depressive symptoms.   Perform or refer for a full diagnostic interview when positive screening results are noted; use DSM-5 criteria to determine appropriate diagnosis (e.g., major depression, persistent depressive disorder, unspecified depressive    disorder).   Notes:    Timeframe:  Long-Range Goal Priority:  Medium  Start Date:    03/28/20                      Expected End Date:   06/25/20               Follow Up Date -within 60 days of 03/28/20   - avoid negative self-talk - develop a personal safety plan - develop a plan to deal with triggers like holidays, anniversaries - exercise at least 2 to 3 times per week - have a plan for how to handle bad days - journal feelings and what helps to feel better or worse - spend time or talk with others at least 2 to 3 times per week - spend time or talk with others every day - watch for early signs of feeling worse - write in journal every day    Why is this important?    Keeping track of your progress will help your treatment team find the right mix of medicine and therapy for you.   Write in your journal every day.   Day-to-day changes in depression symptoms are normal. It may be more helpful to check your progress at the end of each week instead of every day.     Current Barriers:  . Acute Mental Health needs related to anxiety/stress . Limited social support . Mental Health Concerns  . Family and relationship dysfunction . Social Isolation . Suicidal Ideation/Homicidal Ideation: No  Clinical Social Work Goal(s):  Marland Kitchen Over  the next 120 days, patient will work with SW bi-monthly by telephone or in person to reduce or manage symptoms related to anxiety  . Over the next 120 days, patient will work with SW to address concerns related to gaining mental health support and develop better coping skills to manage symptoms  Interventions: . Patient interviewed and appropriate assessments performed: GAD 7 and PHQ 9. Scores have improved drastically overt the past few months. Patient reported that she went on a vacation during the summer to the [place] and was able to spend time with family which increased her ability to cope with anxiety/stress/triggers more effectively.  . Patient reports that she has  joined a Tax adviser nearby[employer]. Patient shares that she will go there several times per week to gain socialization, relaxation from massage chair and exercise. Positive reinforcement provided for this self-care implementation.  . Patient shares that she wishes to find a local church in order to gain additional positive support. She has been invited to several churches but they are too far of a drive. Patient wishes to disconnect from all negative influences in her life. LCSW provided education on healthy self-care and boundary implementation.  . Patient shares that she is effectively implementing healthy eating habits into her daily routine to increase her self-care. Positive reinforcement provided for this achievement.  Marland Kitchen LCSW provided education on relaxation techniques such as meditation, deep breathing, massage, or yoga that can activate the body's relaxation response and ease symptoms of acute anxiety. LCSW ask that when pt is struggling with difficult emotions and stress that she start this relaxation response process.  . Discussed plans with patient for ongoing care management follow up and provided patient with direct contact information for care management team . Advised patient to contact CCM program for any urgent case management needs . Assisted patient/caregiver with obtaining information about health plan benefits . A voluntary and extensive discussion about advanced care planning including explanation and discussion of advanced was undertaken with the patient. Explanation regarding healthcare proxy and living will was reviewed and packet with forms with explanation of how to fill them out was given.   . Encouraged patient to consider long term follow up therapy/counseling . Brief CBTprovided during session  . LCSW has sent a secure email with mental health resources during a previous outreach. LCSW provided brief mental health resource education during session but patient again  denies wanting or needing to pursue these resources at this time but was appreciative of the resource education.  . Patient reports that she is currently at work in [facility]. 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 She reports that she is trying to build back up her clients and hours.  . Patient reports that she has been actively talking to her support network which includes both of her sisters.  Marland Kitchen CCM Pharmacist informed CCM LCSW that patient was having a difficult time with her depression. Patient was appreciative of phone and coping skill education provided. Patient LCSW discussed coping skills for anxiety. SW used empathetic and active and reflective listening, validated patient's feelings/concerns, and provided emotional support. LCSW provided self-care examples to help patient manage their multiple health conditions and improve her mood.  . Patient shares that she is no longer attending the church she recently found because they were not following the mask mandate. Patient shares that she has a found a small support group that offers ministry.  She plans to start attending this service soon. . Patient shares that she was recently prescribed Ambien to treat her insomnia. Patient reports that she is actively working on her sleep routine. LCSW provided education on healthy sleep hygiene and what that looks like. LCSW encouraged patient to implement a night time routine into her schedule that works best for her and that she is able to maintain. Advised patient to implement deep breathing/grounding/meditation/self-care exercises into her nightly routine to combat racing thoughts at night. LCSW encouraged patient to wake up at the same time each day, make her sleeping environment comfortable, exercise when able, to limit naps and to not eat or drink anything right before bed.   Patient Self Care Activities:  . Attends  all scheduled provider appointments  Patient Coping Strengths:  . Spirituality . Hopefulness  Patient Self Care Deficits:  . Lack of socialization and strong support network  Please see past updates related to this goal by clicking on the "Past Updates" button in the selected goal     Task: Identify Depressive Symptoms and Facilitate Treatment   Note:   Care Management Activities:    - anxiety screen reviewed - counseling by pharmacist provided - depression screen reviewed - DSM-5 clinical interview performed - medication list reviewed - mental health treatment arranged - participation in psychiatric services encouraged - substance use assessed - substance use risk screen reviewed    Notes:    Patient Care Plan: RNCM: Diabetes Type 2 (Adult)    Problem Identified: RNCM: Management of DM   Priority: High    Goal: RNCM: Management of DM   Priority: Medium  Note:   Objective:  Lab Results  Component Value Date   HGBA1C 7.4 (A) 01/18/2020 .   Lab Results  Component Value Date   CREATININE 0.82 04/12/2019   CREATININE 0.79 06/10/2018   CREATININE 0.87 03/03/2018 .   Marland Kitchen No results found for: EGFR Current Barriers:  Marland Kitchen Knowledge Deficits related to basic Diabetes pathophysiology and self care/management . Knowledge Deficits related to medications used for management of diabetes . Knowledge Deficits related to self administration of injectable diabetes medications (Ozempic) . Limited Social Support . Unable to independently manage DM as evidence of hemoglobin A1C or 7.4 on 01-18-2020 . Lacks social connections . Does not contact provider office for questions/concerns Case Manager Clinical Goal(s):  Marland Kitchen Over the next 120 days, patient will demonstrate improved adherence to prescribed treatment plan for diabetes self care/management as evidenced by:  . daily monitoring and recording of CBG  . adherence to ADA/ carb modified diet . exercise 4/5 days/week . adherence to  prescribed medication regimen Interventions:  . Provided education to patient about basic DM disease process . Reviewed medications with patient and discussed importance of medication adherence. 04-12-2020- the patient is taking medications as ordered. No issues at this time.  . Discussed plans with patient for ongoing care management follow up and provided patient with direct contact information for care management team . Provided patient with written educational materials related to hypo and hyperglycemia and importance of correct treatment. 04-12-2020: The patient denies any issue with lows at this time. Range has been 107 to 168. The patient is hopeful her new hemoglobin A1C will be less than 7.0 . Advised patient, providing education and rationale, to check cbg BID and record, calling pcp for findings outside established parameters.  The patient states 5 days ago her blood sugars were 175-185 but they have come back down to 96 range.  She is mindful of blood sugar readings.  Wants to stay on track. 04-12-2020: The patient states that he blood sugar was 107 this am. The highest it has been is 168 recently. She is monitoring her dietary intake and is working on getting back to the gym. . Review of patient status, including review of consultants reports, relevant laboratory and other test results, and medications completed. . - barriers to adherence to treatment plan identified . - blood glucose monitoring encouraged- fasting <130 and post prandial <180. 04-12-2020: Review . - blood glucose readings reviewed- range over the last 2 weeks have been 107-168 . - mutual A1C goal set or reviewed- goal of 7.0 or less- is hopeful at 04-30-2020 appointment for <7.0 . - resources required to improve adherence to care identified. 04-12-2020: Will anticipate seeing the patient face to face on 04-30-2020 and will provide educational information to the patient about DM . - self-awareness of signs/symptoms of hypo or  hyperglycemia encouraged . - use of blood glucose monitoring log promoted Patient Goals/Self-Care Activities . Over the next 120 days, patient will:  - UNABLE to independently manage DM Adheres to prescribed ADA/carb modified Follow Up Plan: Face to Face appointment with care management team member scheduled for:    04-30-2020 at 10:30 am    Task: RNCM: Alleviate Barriers to Glycemic Management   Note:   Care Management Activities:    - barriers to adherence to treatment plan identified - blood glucose monitoring encouraged - blood glucose readings reviewed - mutual A1C goal set or reviewed - resources required to improve adherence to care identified - self-awareness of signs/symptoms of hypo or hyperglycemia encouraged - use of blood glucose monitoring log promoted       Patient Care Plan: RNCM: Chronic Back  Pain (Adult)    Problem Identified: RNCM: Pain Management Plan (Chronic Pain)   Priority: High    Goal: RNCM: Pain Management Plan Developed for Chroni Back pain   Priority: High  Note:   Current Barriers:  Marland Kitchen Knowledge Deficits related to management of Chronic back pain and osteoarthritis . Care Coordination needs related to effective management of pain in a patient with chronic back pain and discomfort . Chronic Disease Management support and education needs related to Chronic pain and discomfort  . Lacks caregiver support.  . Unable to independently manage pain and discomfort of chronic back pain . Does not adhere to provider recommendations re: years ago the patient admits she did not complete PT on her back from a work injury and feels this significantly impacts her ability to have relief now.  . Does not contact provider office for questions/concerns  Nurse Case Manager Clinical Goal(s):  Marland Kitchen Over the next 120 days, patient will verbalize understanding of plan for management of Chronic pain and discomfort  . Over the next 120 days, patient will work with pcp and CCM  to address needs related to ongoing chronic pain and discomfort . Over the next 120 days, patient will demonstrate a decrease in pain exacerbations as evidenced by compliance with the plan of care and exploring options that will benefit the patient and help with pain relief  . Over the next 120 days, patient will demonstrate improved adherence to prescribed treatment plan for chronic pain and discomfort  as evidenced bydecreased pain level  . Over the next 120 days, patient will demonstrate improved health management independence as evidenced byable to perform ADLS/IADLS  Interventions:  . 1:1 collaboration with [name], Lupita Raider, FNP regarding  development and update of comprehensive plan of care as evidenced by provider attestation and co-signature . Inter-disciplinary care team collaboration (see longitudinal plan of care) . Evaluation of current treatment plan related to chronic pain and discomfort and patient's adherence to plan as established by provider. 04-12-2020: The patient is not working as much now and this is helping with the back pain and discomfort . Advised patient to call the provider for changes in level or intensity of pain . Provided education to patient re: alternative pain relief methods and pacing activities. 04-12-2020: Review of activities and the patient states going to the gym was helpful but she has not been in a couple of months. Education provided on the benefits of working out and strengthening back muscles. The patient will work on making it a priority to go back to the gym.  . Reviewed medications with patient and discussed compliance. 04-12-2020: The patient is compliant with medications  . Discussed plans with patient for ongoing care management follow up and provided patient with direct contact information for care management team . Provided patient with pain relief educational materials related to pain and discomfort of chronic back pain.  . - mutually acceptable comfort  goal set . - pain assessed . - pain management plan developed . - pain treatment goals reviewed . - patient response to treatment assessed . - careful application of heat or ice encouraged . - complementary therapy use encouraged . - deep breathing, relaxation and mindfulness use promoted . - effectiveness of pharmacologic therapy monitored . - motivation and barriers to change assessed and addressed . - mutually acceptable comfort goal set . - participation in physical therapy encouraged . - anxiety related to change in medications acknowledged . - balance, gait and fall risk reviewed . - quality of sleep assessed . - safety plan developed . - sleep hygiene techniques encouraged- patient endorses sleeping about 6 hours a night and feeling rested when waking  . - strategies to improve or maintain safety promoted  Patient Goals/Self-Care Activities Over the next 120 days, patient will:  - Patient will self administer medications as prescribed Patient will attend all scheduled provider appointments Patient will call provider office for new concerns or questions Patient will work with BSW to address care coordination needs and will continue to work with the clinical team to address health care and disease management related needs.    Follow Up Plan: Face to Face appointment with care management team member scheduled for:  04-30-2020 at 10:40 am      Task: RNCM: Partner to Develop Chronic Pain Management Plan   Note:   Care Management Activities:    - mutually acceptable comfort goal set - pain assessed - pain management plan developed - pain treatment goals reviewed - patient response to treatment assessed      Problem Identified: RNCM: Chronic Pain Management (Chronic Pain)     Problem Identified: RNCM: Harm or Injury (Chronic Pain)   Priority: High    Patient Care Plan: RNCM: Management of Hypertension and HLD (Adult)    Problem Identified: RNCM: Hypertension and HLD  Management  (Hypertension)   Priority: Medium  Note:   The patient has HTN and HLD   Goal: RNCM: Management of HTN and HLD   Expected End Date: 06/15/2020  Priority: Medium  Note:   Objective:  . Last practice recorded BP readings:  BP Readings from Last 3 Encounters:  01/18/20 137/70  10/28/19 120/70  09/19/19 124/74 .   Marland Kitchen  Lab Results .  Component . Value . Date .   Marland Kitchen CHOL . 104 . 04/12/2019 .   Marland Kitchen HDL . 45 (L) . 04/12/2019 .   Marland Kitchen [place] . 45 . 04/12/2019 .   Marland Kitchen TRIG . 63 . 04/12/2019 .   Marland Kitchen CHOLHDL . 2.3 . 04/12/2019 .   Takes Lipitor 40 mg QD  . Most recent eGFR/CrCl: No results found for: EGFR  No components found for: CRCL Current Barriers:  Marland Kitchen Knowledge Deficits related to basic understanding of hypertension and HLD pathophysiology and self care management . Knowledge Deficits related to understanding of medications prescribed for management of hypertension and HLD . Unable to independently manage chronic conditions of HTN and HLD . Does not contact provider office for questions/concerns Case Manager Clinical Goal(s):  Marland Kitchen Over the next 120 days, patient will verbalize understanding of plan for hypertension management . Over the next 120 days, patient will attend all scheduled medical appointments: saw pcp on 01-18-2020. Knows to call for changes in condition . Over the next 120 days, patient will demonstrate improved adherence to prescribed treatment plan for hypertension as evidenced by taking all medications as prescribed, monitoring and recording blood pressure as directed, adhering to low sodium/DASH diet . Over the next 120 days, patient will demonstrate improved health management independence as evidenced by checking blood pressure as directed and notifying PCP if SBP>160 or DBP > 90, taking all medications as prescribe, and adhering to a low sodium diet as discussed. Interventions:  . Evaluation of current treatment plan related to hypertension self management and patient's  adherence to plan as established by provider. 04-12-2020: The patient is compliant with heart healthy diet, is working on going back to the gym to help with improving chronic conditions.  . Provided education to patient re: stroke prevention, s/s of heart attack and stroke, DASH diet, complications of uncontrolled blood pressure . Reviewed medications with patient and discussed importance of compliance . Discussed plans with patient for ongoing care management follow up and provided patient with direct contact information for care management team . Advised patient, providing education and rationale, to monitor blood pressure daily and record, calling PCP for findings outside established parameters.  . Review of recent sinus/cold congestion. Took COVID test and the test was negative . - blood pressure trends reviewed . - depression screen reviewed . - home or ambulatory blood pressure monitoring encouraged . - healthy diet promoted . - healthy family lifestyle promoted . - medication side effects managed . - pain assessed and managed . - patient response to treatment assessed . - quality of sleep assessed . - reduction of dietary sodium encouraged . - reduction in sedentary activities encouraged . - response to pharmacologic therapy monitored . - sleep hygiene techniques encouraged Patient Goals/Self-Care Activities . Over the next 120 days, patient will:  - Calls provider office for new concerns, questions, or BP outside discussed parameters Checks BP and records as discussed Follows a low sodium diet/DASH diet Follow Up Plan: Face to Face appointment with care management team member scheduled for:   04-30-2020 at 10:40 am   Task: RNCM: Identify and Monitor Blood Pressure Elevation   Note:   Care Management Activities:    - blood pressure trends reviewed - depression screen reviewed - home or ambulatory blood pressure monitoring encouraged      Problem Identified: RNCM: Disease  Progression (Hypertension)   Priority: Medium  Note:   See goal for HTN     Patient  verbalizes understanding of instructions provided today and agrees to view in [place].   Face to Face appointment with care management team member scheduled for: 04-30-2020 at 10:40 am [place], MSN, [place] Mobile: [phone]

## 2020-04-12 NOTE — Chronic Care Management (AMB) (Signed)
Chronic Care Management   CCM RN Visit Note  04/12/2020 Name: Summer Young MRN: [id number] DOB: Aug 10, 1950  Subjective: Summer Young is a 70 y.o. year old female who is a primary care patient of [name], Lupita Raider, FNP. The care management team was consulted for assistance with disease management and care coordination needs.    Engaged with patient by telephone for follow up visit in response to provider referral for case management and/or care coordination services.   Consent to Services:  The patient was given information about Chronic Care Management services, agreed to services, and gave verbal consent prior to initiation of services.  Please see initial visit note for detailed documentation.   Patient agreed to services and verbal consent obtained.   Assessment: Review of patient past medical history, allergies, medications, health status, including review of consultants reports, laboratory and other test data, was performed as part of comprehensive evaluation and provision of chronic care management services.   SDOH (Social Determinants of Health) assessments and interventions performed:    CCM Care Plan  Allergies  Allergen Reactions  . Aspirin Hives    Other reaction(s): Other (See Comments) Other Reaction: Not Assessed   . Insulin Detemir Hives and Rash    Errythema, edema, heat at site of injection did not improve after 2 weeks of use.    . Shellfish Allergy Anaphylaxis  . Dapagliflozin Other (See Comments)    Yeast infections Yeast infection Farxiga  . Influenza Vaccines Other (See Comments)    Forxiga  . Other     Other reaction(s): Other (See Comments) Uncoded Allergy. Allergen: Shellfish, Other Reaction: Not Assessed    Flu vaccine given 1998 caused anaphylaxis    Outpatient Encounter Medications as of 04/12/2020  Medication Sig Note  . acetaminophen (TYLENOL) 500 MG tablet Take 500 mg by mouth at bedtime as needed.   Marland Kitchen alendronate (FOSAMAX) 70 MG tablet Take 1  tablet (70 mg total) by mouth once a week. Take with a full glass of water on an empty stomach.   . Ascorbic Acid (VITAMIN C) 1000 MG tablet Take 1,000 mg by mouth daily as needed.  09/20/2018: As needed to prevent illness  . atorvastatin (LIPITOR) 40 MG tablet Take 1 tablet by mouth once daily   . Biotin 1000 MCG tablet Take 4,000 mcg by mouth every other day.  01/19/2018: Takes every other day  . blood glucose meter kit and supplies Dispense based on patient and insurance preference. Use up to four times daily as directed. (FOR ICD-10 E10.9, E11.9).   Marland Kitchen Cholecalciferol (VITAMIN D3) 50 MCG (2000 UT) capsule Take 1,000 Units by mouth daily.    . diclofenac Sodium (VOLTAREN) 1 % GEL Apply 2 g topically 3 (three) times daily as needed.   . insulin degludec (TRESIBA FLEXTOUCH) 100 UNIT/ML SOPN FlexTouch Pen Inject 0.26 mLs (26 Units total) into the skin daily. (Patient taking differently: Inject 20 Units into the skin daily.)   . meloxicam (MOBIC) 15 MG tablet Take 1 tablet (15 mg total) by mouth daily as needed for pain.   . metFORMIN (GLUCOPHAGE-XR) 500 MG 24 hr tablet TAKE 3 TABLETS BY MOUTH ONCE DAILY WITH BREAKFAST   . polyethylene glycol powder (GLYCOLAX/MIRALAX) 17 GM/SCOOP powder Take 17 g by mouth 2 (two) times daily as needed.   . Semaglutide, 1 MG/DOSE, (OZEMPIC, 1 MG/DOSE,) 2 MG/1.5ML SOPN INJECT 1 MG SUBCUTANEOUSLY ONCE A WEEK   . spironolactone (ALDACTONE) 25 MG tablet Take 1 tablet by mouth once daily   .  vitamin B-12 (CYANOCOBALAMIN) 1000 MCG tablet Take 1,000 mcg by mouth daily.    . vitamin E 400 UNIT capsule Take by mouth.    No facility-administered encounter medications on file as of 04/12/2020.    Patient Active Problem List   Diagnosis Date Noted  . Cough 01/18/2020  . Fever 01/18/2020  . Acute back pain 10/28/2019  . Immunization due 09/19/2019  . Osteoporosis 08/16/2019  . Screening for osteoporosis 06/16/2019  . Screening for breast cancer 06/16/2019  . Fatigue  05/03/2019  . 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 Allergic rhinitis 02/26/2018  . Hyperlipidemia 02/26/2018  . Diabetes mellitus ([place]) 02/26/2018  . Abdominal pain 02/26/2018  . Cataract cortical, senile 01/18/2018  . Seasonal allergic rhinitis due to pollen 07/24/2016  . Chronic bilateral low back pain without sciatica 11/22/2014  . Chronic pain of both knees 11/22/2014  . Essential hypertension 11/22/2014  . Dyslipidemia associated with type 2 diabetes mellitus ([place]) 11/22/2014  . Type 2 diabetes mellitus with hyperglycemia ([place]) 01/19/2013    Conditions to be addressed/monitored:HTN, HLD, DMII and Chronic Back pain  Care Plan : RNCM: Diabetes Type 2 (Adult)  Updates made by [name] since 04/12/2020 12:00 AM    Problem: RNCM: Management of DM   Priority: High    Goal: RNCM: Management of DM   Priority: Medium  Note:   Objective:  Lab Results  Component Value Date   HGBA1C 7.4 (A) 01/18/2020 .   Lab Results  Component Value Date   CREATININE 0.82 04/12/2019   CREATININE 0.79 06/10/2018   CREATININE 0.87 03/03/2018 .   Marland Kitchen No results found for: EGFR Current Barriers:  Marland Kitchen Knowledge Deficits related to basic Diabetes pathophysiology and self care/management . Knowledge Deficits related to medications used for management of diabetes . Knowledge Deficits related to self administration of injectable diabetes medications (Ozempic) . Limited Social Support . Unable to independently manage DM as evidence of hemoglobin A1C or 7.4 on 01-18-2020 . Lacks social connections . Does not contact provider office for questions/concerns Case Manager Clinical Goal(s):  Marland Kitchen Over the next 120 days, patient will demonstrate improved adherence to prescribed treatment plan for diabetes self care/management as evidenced by:  . daily monitoring and recording of CBG  . adherence to ADA/ carb modified diet . exercise 4/5 days/week . adherence to  prescribed medication regimen Interventions:  . Provided education to patient about basic DM disease process . Reviewed medications with patient and discussed importance of medication adherence. 04-12-2020- the patient is taking medications as ordered. No issues at this time.  . Discussed plans with patient for ongoing care management follow up and provided patient with direct contact information for care management team . Provided patient with written educational materials related to hypo and hyperglycemia and importance of correct treatment. 04-12-2020: The patient denies any issue with lows at this time. Range has been 107 to 168. The patient is hopeful her new hemoglobin A1C will be less than 7.0 . Advised patient, providing education and rationale, to check cbg BID and record, calling pcp for findings outside established parameters.  The patient states 5 days ago her blood sugars were 175-185 but they have come back down to 96 range. She is mindful of blood sugar readings.  Wants to stay on track. 04-12-2020: The patient states that he blood sugar was 107 this am. The highest it has been is 168 recently. She is monitoring her dietary intake and is working on getting  back to the gym. . Review of patient status, including review of consultants reports, relevant laboratory and other test results, and medications completed. . - barriers to adherence to treatment plan identified . - blood glucose monitoring encouraged- fasting <130 and post prandial <180. 04-12-2020: Review . - blood glucose readings reviewed- range over the last 2 weeks have been 107-168 . - mutual A1C goal set or reviewed- goal of 7.0 or less- is hopeful at 04-30-2020 appointment for <7.0 . - resources required to improve adherence to care identified. 04-12-2020: Will anticipate seeing the patient face to face on 04-30-2020 and will provide educational information to the patient about DM . - self-awareness of signs/symptoms of hypo or  hyperglycemia encouraged . - use of blood glucose monitoring log promoted Patient Goals/Self-Care Activities . Over the next 120 days, patient will:  - UNABLE to independently manage DM Adheres to prescribed ADA/carb modified Follow Up Plan: Face to Face appointment with care management team member scheduled for:    04-30-2020 at 10:30 am    Care Plan : RNCM: Chronic Back  Pain (Adult)  Updates made by [name] since 04/12/2020 12:00 AM    Problem: RNCM: Pain Management Plan (Chronic Pain)   Priority: High    Goal: RNCM: Pain Management Plan Developed for Chroni Back pain   Priority: High  Note:   Current Barriers:  Marland Kitchen Knowledge Deficits related to management of Chronic back pain and osteoarthritis . Care Coordination needs related to effective management of pain in a patient with chronic back pain and discomfort . Chronic Disease Management support and education needs related to Chronic pain and discomfort  . Lacks caregiver support.  . Unable to independently manage pain and discomfort of chronic back pain . Does not adhere to provider recommendations re: years ago the patient admits she did not complete PT on her back from a work injury and feels this significantly impacts her ability to have relief now.  . Does not contact provider office for questions/concerns  Nurse Case Manager Clinical Goal(s):  Marland Kitchen Over the next 120 days, patient will verbalize understanding of plan for management of Chronic pain and discomfort  . Over the next 120 days, patient will work with pcp and CCM to address needs related to ongoing chronic pain and discomfort . Over the next 120 days, patient will demonstrate a decrease in pain exacerbations as evidenced by compliance with the plan of care and exploring options that will benefit the patient and help with pain relief  . Over the next 120 days, patient will demonstrate improved adherence to prescribed treatment plan for chronic pain and discomfort  as  evidenced bydecreased pain level  . Over the next 120 days, patient will demonstrate improved health management independence as evidenced byable to perform ADLS/IADLS  Interventions:  . 1:1 collaboration with [place], Lupita Raider, FNP regarding development and update of comprehensive plan of care as evidenced by provider attestation and co-signature . Inter-disciplinary care team collaboration (see longitudinal plan of care) . Evaluation of current treatment plan related to chronic pain and discomfort and patient's adherence to plan as established by provider. 04-12-2020: The patient is not working as much now and this is helping with the back pain and discomfort . Advised patient to call the provider for changes in level or intensity of pain . Provided education to patient re: alternative pain relief methods and pacing activities. 04-12-2020: Review of activities and the patient states going to the gym was helpful but she  has not been in a couple of months. Education provided on the benefits of working out and strengthening back muscles. The patient will work on making it a priority to go back to the gym.  . Reviewed medications with patient and discussed compliance. 04-12-2020: The patient is compliant with medications  . Discussed plans with patient for ongoing care management follow up and provided patient with direct contact information for care management team . Provided patient with pain relief educational materials related to pain and discomfort of chronic back pain.  . - mutually acceptable comfort goal set . - pain assessed . - pain management plan developed . - pain treatment goals reviewed . - patient response to treatment assessed . - careful application of heat or ice encouraged . - complementary therapy use encouraged . - deep breathing, relaxation and mindfulness use promoted . - effectiveness of pharmacologic therapy monitored . - motivation and barriers to change assessed and  addressed . - mutually acceptable comfort goal set . - participation in physical therapy encouraged . - anxiety related to change in medications acknowledged . - balance, gait and fall risk reviewed . - quality of sleep assessed . - safety plan developed . - sleep hygiene techniques encouraged- patient endorses sleeping about 6 hours a night and feeling rested when waking  . - strategies to improve or maintain safety promoted  Patient Goals/Self-Care Activities Over the next 120 days, patient will:  - Patient will self administer medications as prescribed Patient will attend all scheduled provider appointments Patient will call provider office for new concerns or questions Patient will work with BSW to address care coordination needs and will continue to work with the clinical team to address health care and disease management related needs.    Follow Up Plan: Face to Face appointment with care management team member scheduled for:  04-30-2020 at 10:40 am      Care Plan : RNCM: Management of Hypertension and HLD (Adult)  Updates made by [name] since 04/12/2020 12:00 AM    Problem: RNCM: Hypertension and HLD Management  (Hypertension)   Priority: Medium  Note:   The patient has HTN and HLD   Goal: RNCM: Management of HTN and HLD   Expected End Date: 06/15/2020  Priority: Medium  Note:   Objective:  . Last practice recorded BP readings:  BP Readings from Last 3 Encounters:  01/18/20 137/70  10/28/19 120/70  09/19/19 124/74 .   Marland Kitchen Lab Results .  Component . Value . Date .   Marland Kitchen CHOL . 104 . 04/12/2019 .   Marland Kitchen HDL . 45 (L) . 04/12/2019 .   Marland Kitchen [place] . 45 . 04/12/2019 .   Marland Kitchen TRIG . 63 . 04/12/2019 .   Marland Kitchen CHOLHDL . 2.3 . 04/12/2019 .   Takes Lipitor 40 mg QD  . Most recent eGFR/CrCl: No results found for: EGFR  No components found for: CRCL Current Barriers:  Marland Kitchen Knowledge Deficits related to basic understanding of hypertension and HLD pathophysiology and self care  management . Knowledge Deficits related to understanding of medications prescribed for management of hypertension and HLD . Unable to independently manage chronic conditions of HTN and HLD . Does not contact provider office for questions/concerns Case Manager Clinical Goal(s):  Marland Kitchen Over the next 120 days, patient will verbalize understanding of plan for hypertension management . Over the next 120 days, patient will attend all scheduled medical appointments: saw pcp on 01-18-2020. Knows to call for changes in condition .  Over the next 120 days, patient will demonstrate improved adherence to prescribed treatment plan for hypertension as evidenced by taking all medications as prescribed, monitoring and recording blood pressure as directed, adhering to low sodium/DASH diet . Over the next 120 days, patient will demonstrate improved health management independence as evidenced by checking blood pressure as directed and notifying PCP if SBP>160 or DBP > 90, taking all medications as prescribe, and adhering to a low sodium diet as discussed. Interventions:  . Evaluation of current treatment plan related to hypertension self management and patient's adherence to plan as established by provider. 04-12-2020: The patient is compliant with heart healthy diet, is working on going back to the gym to help with improving chronic conditions.  . Provided education to patient re: stroke prevention, s/s of heart attack and stroke, DASH diet, complications of uncontrolled blood pressure . Reviewed medications with patient and discussed importance of compliance . Discussed plans with patient for ongoing care management follow up and provided patient with direct contact information for care management team . Advised patient, providing education and rationale, to monitor blood pressure daily and record, calling PCP for findings outside established parameters.  . Review of recent sinus/cold congestion. Took COVID test and the  test was negative . - blood pressure trends reviewed . - depression screen reviewed . - home or ambulatory blood pressure monitoring encouraged . - healthy diet promoted . - healthy family lifestyle promoted . - medication side effects managed . - pain assessed and managed . - patient response to treatment assessed . - quality of sleep assessed . - reduction of dietary sodium encouraged . - reduction in sedentary activities encouraged . - response to pharmacologic therapy monitored . - sleep hygiene techniques encouraged Patient Goals/Self-Care Activities . Over the next 120 days, patient will:  - Calls provider office for new concerns, questions, or BP outside discussed parameters Checks BP and records as discussed Follows a low sodium diet/DASH diet Follow Up Plan: Face to Face appointment with care management team member scheduled for:   04-30-2020 at 10:40 am     Plan:Face to Face appointment with care management team member scheduled for: 04-30-2020 at 10:40 am  [place], MSN, [place] Mobile: [phone]

## 2020-04-13 ENCOUNTER — Other Ambulatory Visit: Payer: Self-pay

## 2020-04-13 DIAGNOSIS — E1165 Type 2 diabetes mellitus with hyperglycemia: Secondary | ICD-10-CM

## 2020-04-13 DIAGNOSIS — E785 Hyperlipidemia, unspecified: Secondary | ICD-10-CM

## 2020-04-13 DIAGNOSIS — E1169 Type 2 diabetes mellitus with other specified complication: Secondary | ICD-10-CM

## 2020-04-13 DIAGNOSIS — I10 Essential (primary) hypertension: Secondary | ICD-10-CM

## 2020-04-13 MED ORDER — SPIRONOLACTONE 25 MG PO TABS: 25.0000 mg | ORAL_TABLET | Freq: Every day | ORAL | 0 refills | Status: DC

## 2020-04-13 MED ORDER — ATORVASTATIN CALCIUM 40 MG PO TABS: 40.0000 mg | ORAL_TABLET | Freq: Every day | ORAL | 0 refills | Status: DC

## 2020-04-30 ENCOUNTER — Ambulatory Visit (INDEPENDENT_AMBULATORY_CARE_PROVIDER_SITE_OTHER): Payer: PPO | Admitting: Family Medicine

## 2020-04-30 ENCOUNTER — Telehealth: Payer: Self-pay

## 2020-04-30 ENCOUNTER — Other Ambulatory Visit: Payer: Self-pay

## 2020-04-30 ENCOUNTER — Encounter: Payer: Self-pay | Admitting: Family Medicine

## 2020-04-30 VITALS — BP 136/79 | HR 86 | Ht 61.0 in | Wt 162.2 lb

## 2020-04-30 DIAGNOSIS — M25562 Pain in left knee: Secondary | ICD-10-CM

## 2020-04-30 DIAGNOSIS — M545 Low back pain, unspecified: Secondary | ICD-10-CM | POA: Diagnosis not present

## 2020-04-30 DIAGNOSIS — M170 Bilateral primary osteoarthritis of knee: Secondary | ICD-10-CM

## 2020-04-30 DIAGNOSIS — E1165 Type 2 diabetes mellitus with hyperglycemia: Secondary | ICD-10-CM

## 2020-04-30 DIAGNOSIS — G8929 Other chronic pain: Secondary | ICD-10-CM

## 2020-04-30 DIAGNOSIS — M47816 Spondylosis without myelopathy or radiculopathy, lumbar region: Secondary | ICD-10-CM

## 2020-04-30 DIAGNOSIS — M25561 Pain in right knee: Secondary | ICD-10-CM

## 2020-04-30 LAB — POCT GLYCOSYLATED HEMOGLOBIN (HGB A1C): Hemoglobin A1C: 7.2 % — AB (ref 4.0–5.6)

## 2020-04-30 NOTE — Telephone Encounter (Signed)
-----   Message from [name] sent at 04/30/2020  8:40 AM EDT ----- Regarding: was going to see in office today- can you tell her I will follow up Hello All, [name] is still with [place]. We are having some spurts of good days and bad days. I am a little leary to leave him right now. I was going to see Summer Young in the office today. Please let her know I am sorry and I will follow up with her again soon. I spoke to her a couple weeks ago. It was mainly so she could put a face with a name.  Thanks for being my awesome teammates!

## 2020-04-30 NOTE — Patient Instructions (Addendum)
Thank you for coming to the office today.  Recent Labs    09/19/19 0810 01/18/20 0845 04/30/20 1049  HGBA1C 7.3* 7.4* 7.2*   Keep up the great job!  Reduce Metformin by 1 pill per day. Now take Metformin XR 500mg  x 2 = 1000mg  daily  Keep other medicines.  Send a message with picture / info for the Cream / which one you are using and if you need a new rx and also Diabetic Testing Strips - with info.  X-rays for Knees both ordered. Stay tuned when we can get you in for that. Hopefully soon, can do walk in first come first serve.  May consider the injection based on X-ray, otherwise stick with topical Diclofenac gel up to 4 times daily as needed.  Please schedule a Follow-up Appointment to: Return in about 3 months (around 07/31/2020) for 3 month follow-up with new provider DM A1c, Knee pain arthritis.  If you have any other questions or concerns, please feel free to call the office or send a message through [place]. You may also schedule an earlier appointment if necessary.  Additionally, you may be receiving a survey about your experience at our office within a few days to 1 week by e-mail or mail. We value your feedback.  Nobie Putnam, DO [place]

## 2020-04-30 NOTE — Telephone Encounter (Signed)
I called the patient and left a message on her voicemail that we need to cancel her appt for today.

## 2020-04-30 NOTE — Progress Notes (Signed)
Subjective:    Patient ID: Summer Young, female    DOB: 1951/01/24, 70 y.o.   MRN: [id number]  Summer Young is a 70 y.o. female presenting on 04/30/2020 for Back Pain, Knee Pain, and Diabetes   HPI   CHRONIC DM, Type 2: Reports no concerns. Due for A1c today, prior range 7.4 CBGs: January avg 110 (low 75, high 150), Feb avg 125 (low 98-high 158) - due to back pain elevated, March avg 107 (low 86, high 129). Checks CBG x1 in AM Meds: Ozempic 1mg  weekly inj, Metformin XR 1500mg  daily in AM (x3 of the 500mg ), also Tresiba 20 units daily. Reports  good compliance. Tolerating well w/o side-effects Currently not on ACEi / ARB will need future urine microalbumin Lifestyle: - Diet (improving)  - Exercise (limited) Denies hypoglycemia, polyuria, visual changes, numbness or tingling.  Intertrigo, below breasts bilateral She has tried antifungal Nystatin powder OTC, she used same powder for foot and it has helped, but if not using the symptoms. She also has a cream wanted to know if she can get more. Admits red irritated inflamed rash with some burning. Temporary improvement now returned. She will contact us with name of cream.  Osteoarthritis multiple joints Bilateral Knee with OA/DJD and Pain Chronic Low Back Pain  Improved w/ weight loss Previously had L knee ACL repair 1994 She has seen Orthopedic in past, for L knee She has not had cortisone injections Previously on NSAIDs oral, now she takes Diclofenac topical for joint pain. Asking about updating Knee X-rays. Prior Lumbar X-ray 02/2019  Past Surgical History:  Procedure Laterality Date  . ANKLE SURGERY Left 2009  . DILATION AND CURETTAGE, DIAGNOSTIC / THERAPEUTIC  1998   miscarriage  . KNEE SURGERY Left 1994   ACL    Depression screen [facility] 2/9 11/29/2019 06/06/2019 05/31/2019  Decreased Interest 0 0 0  Down, Depressed, Hopeless 0 0 0  PHQ - 2 Score 0 0 0  Altered sleeping - - -  Tired, decreased energy - - -  Change in  appetite - - -  Feeling bad or failure about yourself  - - -  Trouble concentrating - - -  Moving slowly or fidgety/restless - - -  Suicidal thoughts - - -  PHQ-9 Score - - -  Difficult doing work/chores - - -    Social History   Tobacco Use  . Smoking status: Never Smoker  . Smokeless tobacco: Never Used  Vaping Use  . Vaping Use: Never used  Substance Use Topics  . Alcohol use: Yes    Alcohol/week: 0.0 - 1.0 standard drinks    Comment: occasional  . Drug use: Never    Review of Systems Per HPI unless specifically indicated above     Objective:    BP 136/79   Pulse 86   Ht 5\' 1"  (1.549 m)   Wt 162 lb 3.2 oz (73.6 kg)   SpO2 100%   BMI 30.65 kg/m   Wt Readings from Last 3 Encounters:  04/30/20 162 lb 3.2 oz (73.6 kg)  01/18/20 159 lb 12.8 oz (72.5 kg)  10/28/19 164 lb (74.4 kg)    Physical Exam Vitals and nursing note reviewed.  Constitutional:      General: She is not in acute distress.    Appearance: She is well-developed. She is not diaphoretic.     Comments: Well-appearing, comfortable, cooperative  HENT:     Head: Normocephalic and atraumatic.  Eyes:     General:  Right eye: No discharge.        Left eye: No discharge.     Conjunctiva/sclera: Conjunctivae normal.  Cardiovascular:     Rate and Rhythm: Normal rate.  Pulmonary:     Effort: Pulmonary effort is normal.  Skin:    General: Skin is warm and dry.     Findings: No erythema or rash.  Neurological:     Mental Status: She is alert and oriented to person, place, and time.  Psychiatric:        Behavior: Behavior normal.     Comments: Well groomed, good eye contact, normal speech and thoughts      I have personally reviewed the radiology report from 08/16/19.  CLINICAL DATA:  Chronic bilateral low back pain, without sciatica.  EXAM: LUMBAR SPINE - COMPLETE 4+ VIEW  COMPARISON:  Report from lumbar spine radiographs 07/10/2015 (images unavailable).  FINDINGS: Five lumbar  vertebrae. Alignment is preserved. Lumbar vertebral body height is maintained. No definite pars interarticularis defect identified on oblique radiographs. Mild-to-moderate disc degeneration, greatest at L2-L3, L3-L4 and L5-S1. Multilevel degenerative endplate spurring, greatest at L5-S1. 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  IMPRESSION: No compression deformity.  Lumbar spondylosis as described.   Electronically Signed   By: Kellie Simmering DO   On: 02/24/2019 09:08   Results for orders placed or performed in visit on 04/30/20  POCT HgB A1C  Result Value Ref Range   Hemoglobin A1C 7.2 (A) 4.0 - 5.6 %      Assessment & Plan:   Problem List Items Addressed This Visit    Spondylosis of lumbar region without myelopathy or radiculopathy   Diabetes mellitus ([place]) - Primary   Relevant Orders   POCT HgB A1C (Completed)   Chronic bilateral low back pain without sciatica    Other Visit Diagnoses    Primary osteoarthritis of both knees       Relevant Orders   DG Knee Complete 4 Views Left   DG Knee Complete 4 Views Right   Bilateral chronic knee pain       Relevant Orders   DG Knee Complete 4 Views Left   DG Knee Complete 4 Views Right     #DM2 A1c controlled at 7.2, slightly improved May adjust medication now with reduce Metformin from XR 500mg  x 3 down to x 2 pills = 1000mg  daily Continue Ozempic 1mg  weekly Continue Tresiba 20u daily Future needs urine microalbumin  #Chronic Low Back Pain / Knee pain #OA/DJD multiple sites including back lumbar spine and bilateral knees Last X_ray lumbar 2021, reviewed. Due for knee x-rays none on file, she will return to office, orders in for bilateral knee x-rays. No x-ray in building today. For now continue topical Diclofenac and conservative therapy, we discussed consider cortisone injections for knee, she is hesitant but will consider, goal to avoid surgery Future may return to Orthopedic.  #Intertrigo, breast skin  folds Episodic recurrent problem. Previously temporary relief with antifungal nystatin powder, but she has used cream needs re order she will call with information.  No orders of the defined types were placed in this encounter.     Follow up plan: Return in about 3 months (around 07/31/2020) for 3 month follow-up with new provider DM A1c, Knee pain arthritis.   Nobie Putnam, [facility] 04/30/2020, 10:44 AM

## 2020-05-08 ENCOUNTER — Other Ambulatory Visit: Payer: Self-pay

## 2020-05-08 ENCOUNTER — Ambulatory Visit
Admission: RE | Admit: 2020-05-08 | Discharge: 2020-05-08 | Disposition: A | Discharge status: Home / Self Care | Payer: PPO | Source: Ambulatory Visit | Attending: Family Medicine | Admitting: Family Medicine

## 2020-05-08 ENCOUNTER — Other Ambulatory Visit: Payer: Self-pay | Admitting: Family Medicine

## 2020-05-08 ENCOUNTER — Ambulatory Visit (INDEPENDENT_AMBULATORY_CARE_PROVIDER_SITE_OTHER): Payer: PPO

## 2020-05-08 ENCOUNTER — Ambulatory Visit
Admission: RE | Admit: 2020-05-08 | Discharge: 2020-05-08 | Disposition: A | Discharge status: Home / Self Care | Payer: PPO | Attending: Family Medicine | Admitting: Family Medicine

## 2020-05-08 VITALS — Ht 62.0 in | Wt 161.0 lb

## 2020-05-08 DIAGNOSIS — M1711 Unilateral primary osteoarthritis, right knee: Secondary | ICD-10-CM | POA: Diagnosis not present

## 2020-05-08 DIAGNOSIS — M25562 Pain in left knee: Secondary | ICD-10-CM | POA: Diagnosis not present

## 2020-05-08 DIAGNOSIS — G8929 Other chronic pain: Secondary | ICD-10-CM

## 2020-05-08 DIAGNOSIS — Z0000 Encounter for general adult medical examination without abnormal findings: Secondary | ICD-10-CM | POA: Diagnosis not present

## 2020-05-08 DIAGNOSIS — M25561 Pain in right knee: Secondary | ICD-10-CM | POA: Diagnosis not present

## 2020-05-08 DIAGNOSIS — M11262 Other chondrocalcinosis, left knee: Secondary | ICD-10-CM | POA: Diagnosis not present

## 2020-05-08 DIAGNOSIS — M11261 Other chondrocalcinosis, right knee: Secondary | ICD-10-CM | POA: Diagnosis not present

## 2020-05-08 DIAGNOSIS — M1712 Unilateral primary osteoarthritis, left knee: Secondary | ICD-10-CM | POA: Diagnosis not present

## 2020-05-08 DIAGNOSIS — M170 Bilateral primary osteoarthritis of knee: Secondary | ICD-10-CM

## 2020-05-08 DIAGNOSIS — R21 Rash and other nonspecific skin eruption: Secondary | ICD-10-CM

## 2020-05-08 DIAGNOSIS — E1165 Type 2 diabetes mellitus with hyperglycemia: Secondary | ICD-10-CM

## 2020-05-08 MED ORDER — AMERIDERM PERISHIELD 3.8 % EX OINT: TOPICAL_OINTMENT | CUTANEOUS | 5 refills | Status: DC

## 2020-05-08 MED ORDER — ONETOUCH ULTRA VI STRP
ORAL_STRIP | 12 refills | Status: AC
Start: 2020-05-08 — End: ?

## 2020-05-08 NOTE — Progress Notes (Signed)
I connected with Summer Young today by telephone and verified that I am speaking with the correct person using two identifiers. Location patient: home Location provider: work Persons participating in the virtual visit: [place], Breach LPN.   I discussed the limitations, risks, security and privacy concerns of performing an evaluation and management service by telephone and the availability of in person appointments. I also discussed with the patient that there may be a patient responsible charge related to this service. The patient expressed understanding and verbally consented to this telephonic visit.    Interactive audio and video telecommunications were attempted between this provider and patient, however failed, due to patient having technical difficulties OR patient did not have access to video capability.  We continued and completed visit with audio only.     Vital signs may be patient reported or missing.  Subjective:   Summer Young is a 70 y.o. female who presents for Medicare Annual (Subsequent) preventive examination.  Review of Systems     Cardiac Risk Factors include: advanced age (>28men, >16 women);diabetes mellitus;dyslipidemia     Objective:    Today's Vitals   05/08/20 1356 05/08/20 1357  Weight: 161 lb (73 kg)   Height: $Remove'5\' 2"'eEoltGG$  (1.575 m)   PainSc:  6    Body mass index is 29.45 kg/m.  Advanced Directives 05/08/2020 09/16/2018 02/26/2018 01/29/2018 01/19/2018  Does Patient Have a Medical Advance Directive? No No No No No  Does patient want to make changes to medical advance directive? - No - Patient declined - - -  Would patient like information on creating a medical advance directive? - - No - Patient declined Yes (MAU/Ambulatory/Procedural Areas - Information given) Yes (MAU/Ambulatory/Procedural Areas - Information given)    Current Medications (verified) Outpatient Encounter Medications as of 05/08/2020  Medication Sig  . acetaminophen (TYLENOL)  500 MG tablet Take 500 mg by mouth at bedtime as needed.  Marland Kitchen alendronate (FOSAMAX) 70 MG tablet Take 1 tablet (70 mg total) by mouth once a week. Take with a full glass of water on an empty stomach.  . Ascorbic Acid (VITAMIN C) 1000 MG tablet Take 1,000 mg by mouth daily as needed.   Marland Kitchen atorvastatin (LIPITOR) 40 MG tablet Take 1 tablet (40 mg total) by mouth daily.  . Biotin 1000 MCG tablet Take 4,000 mcg by mouth every other day.   . blood glucose meter kit and supplies Dispense based on patient and insurance preference. Use up to four times daily as directed. (FOR ICD-10 E10.9, E11.9).  Marland Kitchen Cholecalciferol (VITAMIN D3) 50 MCG (2000 UT) capsule Take 1,000 Units by mouth daily.   . diclofenac Sodium (VOLTAREN) 1 % GEL Apply 2 g topically 3 (three) times daily as needed.  . insulin degludec (TRESIBA FLEXTOUCH) 100 UNIT/ML SOPN FlexTouch Pen Inject 0.26 mLs (26 Units total) into the skin daily. (Patient taking differently: Inject 20 Units into the skin daily.)  . metFORMIN (GLUCOPHAGE-XR) 500 MG 24 hr tablet TAKE 3 TABLETS BY MOUTH ONCE DAILY WITH BREAKFAST (Patient taking differently: Patient is taking 2 tablets daily)  . Semaglutide, 1 MG/DOSE, (OZEMPIC, 1 MG/DOSE,) 2 MG/1.5ML SOPN INJECT 1 MG SUBCUTANEOUSLY ONCE A WEEK  . spironolactone (ALDACTONE) 25 MG tablet Take 1 tablet (25 mg total) by mouth daily.  . vitamin B-12 (CYANOCOBALAMIN) 1000 MCG tablet Take 1,000 mcg by mouth daily.   . polyethylene glycol powder (GLYCOLAX/MIRALAX) 17 GM/SCOOP powder Take 17 g by mouth 2 (two) times daily as needed. (Patient not taking: Reported on  05/08/2020)  . vitamin E 400 UNIT capsule Take by mouth. (Patient not taking: Reported on 05/08/2020)   No facility-administered encounter medications on file as of 05/08/2020.    Allergies (verified) Aspirin, Insulin detemir, Shellfish allergy, Dapagliflozin, Influenza vaccines, and Other   History: Past Medical History:  Diagnosis Date  . Allergy   . Chronic pain of  both knees   . Diabetes mellitus without complication ([place])   . Hyperlipidemia   . Hypertension    Past Surgical History:  Procedure Laterality Date  . ANKLE SURGERY Left 2009  . DILATION AND CURETTAGE, DIAGNOSTIC / THERAPEUTIC  1998   miscarriage  . KNEE SURGERY Left 1994   ACL   Family History  Problem Relation Age of Onset  . Diabetes Mother   . Diabetes Father   . Stroke Father   . Diabetes Sister   . Heart attack Other   . Diabetes Sister   . Diabetes Sister   . Diabetes Sister    Social History   Socioeconomic History  . Marital status: Married    Spouse name: Not on file  . Number of children: Not on file  . Years of education: Not on file  . Highest education level: High school graduate  Occupational History  . Occupation: housekeeper    Comment: self-employed  Tobacco Use  . Smoking status: Never Smoker  . Smokeless tobacco: Never Used  Vaping Use  . Vaping Use: Never used  Substance and Sexual Activity  . Alcohol use: Yes    Alcohol/week: 0.0 - 1.0 standard drinks    Comment: occasional  . Drug use: Never  . Sexual activity: Yes    Birth control/protection: None  Other Topics Concern  . Not on file  Social History Narrative  . Not on file   Social Determinants of Health   Financial Resource Strain: Low Risk   . Difficulty of Paying Living Expenses: Not hard at all  Food Insecurity: No Food Insecurity  . Worried About Charity fundraiser in the Last Year: Never true  . Ran Out of Food in the Last Year: Never true  Transportation Needs: No Transportation Needs  . Lack of Transportation (Medical): No  . Lack of Transportation (Non-Medical): No  Physical Activity: Inactive  . Days of Exercise per Week: 0 days  . Minutes of Exercise per Session: 0 min  Stress: No Stress Concern Present  . Feeling of Stress : Not at all  Social Connections: Moderately Isolated  . Frequency of Communication with Friends and Family: More than three times a week   . Frequency of Social Gatherings with Friends and Family: More than three times a week  . Attends Religious Services: Never  . Active Member of Clubs or Organizations: No  . Attends Archivist Meetings: Never  . Marital Status: Married    Tobacco Counseling Counseling given: Not Answered   Clinical Intake:  Pre-visit preparation completed: Yes  Pain : 0-10 Pain Score: 6  Pain Type: Chronic pain Pain Location: Knee Pain Orientation: Left,Right Pain Descriptors / Indicators: Aching Pain Onset: More than a month ago Pain Frequency: Constant     Nutritional Status: BMI 25 -29 Overweight Nutritional Risks: None Diabetes: Yes  How often do you need to have someone help you when you read instructions, pamphlets, or other written materials from your doctor or pharmacy?: 1 - Never What is the last grade level you completed in school?: 12th grade  Diabetic? Yes Nutrition Risk Assessment:  Has the patient had any N/V/D within the last 2 months?  No  Does the patient have any non-healing wounds?  No  Has the patient had any unintentional weight loss or weight gain?  No   Diabetes:  Is the patient diabetic?  Yes  If diabetic, was a CBG obtained today?  No  Did the patient bring in their glucometer from home?  No  How often do you monitor your CBG's? Every other day.   Financial Strains and Diabetes Management:  Are you having any financial strains with the device, your supplies or your medication? No .  Does the patient want to be seen by Chronic Care Management for management of their diabetes?  No  Would the patient like to be referred to a Nutritionist or for Diabetic Management?  No   Diabetic Exams:  Diabetic Eye Exam: Overdue for diabetic eye exam. Pt has been advised about the importance in completing this exam. Patient advised to call and schedule an eye exam. Diabetic Foot Exam: Overdue, Pt has been advised about the importance in completing this exam.  Pt is scheduled for diabetic foot exam on next appointment.   Interpreter Needed?: No  Information entered by :: NAllen LPN   Activities of Daily Living In your present state of health, do you have any difficulty performing the following activities: 05/08/2020 09/19/2019  Hearing? N N  Vision? N Y  Difficulty concentrating or making decisions? Y N  Walking or climbing stairs? Y N  Dressing or bathing? N N  Doing errands, shopping? N N  Preparing Food and eating ? N -  Using the Toilet? N -  In the past six months, have you accidently leaked urine? Y -  Comment wears pads -  Do you have problems with loss of bowel control? N -  Managing your Medications? N -  Managing your Finances? N -  Housekeeping or managing your Housekeeping? N -  Some recent data might be hidden    Patient Care Team: Malfi, Lupita Raider, FNP as PCP - General (Family Medicine) [place], Virl Diamond, [place] as Pharmacist Greg Cutter, LCSW as [place] [facility] (Licensed Clinical Social Worker) [name], RN as Case Manager (General Practice)  Indicate any recent Medical Services you may have received from other than [facility] providers in the past year (date may be approximate).     Assessment:   This is a routine wellness examination for [place].  Hearing/Vision screen No exam data present  Dietary issues and exercise activities discussed: Current Exercise Habits: The patient does not participate in regular exercise at present  Goals    . Patient Stated     05/08/2020, get A1C below 7    . Pharmacy Goals     Our goal A1c is less than 7%. This corresponds with fasting sugars less than 130 and 2 hour after meal sugars less than 180. Please check your blood sugars and keep log of results  Our goal bad cholesterol, or LDL, is less than 70 . This is why it is important to continue taking your atorvastatin  Please check your home blood pressure, keep a log of the results and bring this  with you to your medical appointments.  Feel free to call me with any questions or concerns. I look forward to our next call!   [name], PharmD, [facility] [place] [phone]     . Track and Manage My Symptoms-Depression  Timeframe:  Long-Range Goal Priority:  High Start Date:    01/24/20                         Expected End Date:   04/24/19                    Follow Up Date -within 90 days   - avoid negative self-talk - develop a personal safety plan - develop a plan to deal with triggers like holidays, anniversaries - exercise at least 2 to 3 times per week - have a plan for how to handle bad days - journal feelings and what helps to feel better or worse - spend time or talk with others at least 2 to 3 times per week - spend time or talk with others every day - watch for early signs of feeling worse - write in journal every day    Why is this important?    Keeping track of your progress will help your treatment team find the right mix of medicine and therapy for you.   Write in your journal every day.   Day-to-day changes in depression symptoms are normal. It may be more helpful to check your progress at the end of each week instead of every day.     Current Barriers:  . Acute Mental Health needs related to anxiety/stress . Limited social support . Mental Health Concerns  . Family and relationship dysfunction . Social Isolation . Suicidal Ideation/Homicidal Ideation: No  Clinical Social Work Goal(s):  Marland Kitchen Over the next 120 days, patient will work with SW bi-monthly by telephone or in person to reduce or manage symptoms related to anxiety  . Over the next 120 days, patient will work with SW to address concerns related to gaining mental health support and develop better coping skills to manage symptoms  Interventions: . Patient interviewed and appropriate assessments performed: GAD 7 and PHQ 9. Scores have  improved drastically overt the past few months. Patient reported that she went on a vacation during the summer to the [place] and was able to spend time with family which increased her ability to cope with anxiety/stress/triggers more effectively.  . 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 Patient shares that she will go there several times per week to gain socialization, relaxation from massage chair and exercise. Positive reinforcement provided for this self-care implementation.  . Patient shares that she wishes to find a local church in order to gain additional positive support. She has been invited to several churches but they are too far of a drive. Patient wishes to disconnect from all negative influences in her life. LCSW provided education on healthy self-care and boundary implementation.  . Patient shares that she is effectively implementing healthy eating habits into her daily routine to increase her self-care. Positive reinforcement provided for this achievement.  Marland Kitchen LCSW provided education on relaxation techniques such as meditation, deep breathing, massage, or yoga that can activate the body's relaxation response and ease symptoms of acute anxiety. LCSW ask that when pt is struggling with difficult emotions and stress that she start this relaxation response process.  . Discussed plans with patient for ongoing care management follow up and provided patient with direct contact information for care management team . Advised patient to contact CCM program for any urgent case management needs . Assisted patient/caregiver with obtaining information about health plan benefits . A voluntary and extensive discussion  about advanced care planning including explanation and discussion of advanced was undertaken with the patient. Explanation regarding healthcare proxy and living will was reviewed and packet with forms with explanation of how to fill them out was given.    . Encouraged patient to consider long term follow up therapy/counseling . Brief CBTprovided during session  . LCSW has sent a secure email with mental health resources during a previous outreach. LCSW provided brief mental health resource education during session but patient again denies wanting or needing to pursue these resources at this time but was appreciative of the resource education.  . Patient reports that she is currently at work in [employer]. 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 She reports that she is trying to build back up her clients and hours.  . Patient reports that she has been actively talking to her support network which includes both of her sisters.  Marland Kitchen CCM Pharmacist informed CCM LCSW that patient was having a difficult time with her depression. Patient was appreciative of phone and coping skill education provided. Patient LCSW discussed coping skills for anxiety. SW used empathetic and active and reflective listening, validated patient's feelings/concerns, and provided emotional support. LCSW provided self-care examples to help patient manage their multiple health conditions and improve her mood.  . Patient shares that she is no longer attending the church she recently found because they were not following the mask mandate. Patient shares that she has a found a small support group that offers ministry. She plans to start attending this service soon. . Patient shares that she was recently prescribed Ambien to treat her insomnia. Patient reports that she is actively working on her sleep routine. LCSW provided education on healthy sleep hygiene and what that looks like. LCSW encouraged patient to implement a night time routine into her schedule that works best for her and that she is able to maintain. Advised patient to implement deep breathing/grounding/meditation/self-care exercises into her  nightly routine to combat racing thoughts at night. LCSW encouraged patient to wake up at the same time each day, make her sleeping environment comfortable, exercise when able, to limit naps and to not eat or drink anything right before bed.   Patient Self Care Activities:  . Attends all scheduled provider appointments  Patient Coping Strengths:  . Spirituality . Hopefulness  Patient Self Care Deficits:  . Lack of socialization and strong support network  Please see past updates related to this goal by clicking on the "Past Updates" button in the selected goal      . Weight (lb) < 160 lb (72.6 kg)      Depression Screen PHQ 2/9 Scores 05/08/2020 11/29/2019 06/06/2019 05/31/2019 12/23/2018 09/16/2018 02/26/2018  PHQ - 2 Score 0 0 0 0 3 0 0  PHQ- 9 Score - - - - 8 - -    Fall Risk Fall Risk  05/08/2020 09/19/2019 09/16/2018 05/05/2018 04/15/2018  Falls in the past year? 0 0 0 0 0  Comment - - - - -  Number falls in past yr: - 0 0 - -  Injury with Fall? - 0 - - -  Risk for fall due to : Medication side effect No Fall Risks - - -  Follow up Falls evaluation completed;Education provided;Falls prevention discussed Falls evaluation completed - Falls evaluation completed;Falls prevention discussed;Education provided -    FALL RISK PREVENTION PERTAINING TO THE HOME:  Any stairs in  or around the home? Yes  If so, are there any without handrails? No  Home free of loose throw rugs in walkways, pet beds, electrical cords, etc? Yes  Adequate lighting in your home to reduce risk of falls? Yes   ASSISTIVE DEVICES UTILIZED TO PREVENT FALLS:  Life alert? No  Use of a cane, walker or w/c? No  Grab bars in the bathroom? Yes  Shower chair or bench in shower? Yes  Elevated toilet seat or a handicapped toilet? Yes   TIMED UP AND GO:  Was the test performed? No .  Cognitive Function:     6CIT Screen 05/08/2020 09/16/2018  What Year? 0 points 0 points  What month? 0 points 0 points  What time? 0  points 0 points  Count back from 20 0 points 0 points  Months in reverse 0 points 0 points  Repeat phrase 4 points 2 points  Total Score 4 2    Immunizations Immunization History  Administered Date(s) Administered  . PFIZER(Purple Top)SARS-COV-2 Vaccination 04/21/2019, 05/12/2019, 12/17/2019  . Pneumococcal Conjugate-13 11/18/2017  . Pneumococcal Polysaccharide-23 09/20/2008  . Tdap 04/23/2007, 07/16/2017  . Zoster 11/12/2015    TDAP status: Up to date  Flu Vaccine status: Declined, Education has been provided regarding the importance of this vaccine but patient still declined. Advised may receive this vaccine at local pharmacy or Health Dept. Aware to provide a copy of the vaccination record if obtained from local pharmacy or Health Dept. Verbalized acceptance and understanding.  Pneumococcal vaccine status: Up to date  Covid-19 vaccine status: Completed vaccines  Qualifies for Shingles Vaccine? Yes   Zostavax completed Yes   Shingrix Completed?: No.    Education has been provided regarding the importance of this vaccine. Patient has been advised to call insurance company to determine out of pocket expense if they have not yet received this vaccine. Advised may also receive vaccine at local pharmacy or Health Dept. Verbalized acceptance and understanding.  Screening Tests Health Maintenance  Topic Date Due  . MAMMOGRAM  Never done  . PNA vac Low Risk Adult (2 of 2 - PPSV23) 11/19/2018  . OPHTHALMOLOGY EXAM  12/27/2019  . FOOT EXAM  04/07/2020  . URINE MICROALBUMIN  04/11/2020  . HEMOGLOBIN A1C  10/31/2020  . COLONOSCOPY (Pts 45-70yrs Insurance coverage will need to be confirmed)  03/06/2023  . TETANUS/TDAP  07/17/2027  . DEXA SCAN  Completed  . COVID-19 Vaccine  Completed  . Hepatitis C Screening  Completed  . HPV VACCINES  Aged Out    Health Maintenance  Health Maintenance Due  Topic Date Due  . MAMMOGRAM  Never done  . PNA vac Low Risk Adult (2 of 2 - PPSV23)  11/19/2018  . OPHTHALMOLOGY EXAM  12/27/2019  . FOOT EXAM  04/07/2020  . URINE MICROALBUMIN  04/11/2020    Colorectal cancer screening: Type of screening: Colonoscopy. Completed 03/05/2018. Repeat every 5 years  Mammogram status: decline  Bone Density status: Completed 08/16/2019.   Lung Cancer Screening: (Low Dose CT Chest recommended if Age 49-80 years, 30 pack-year currently smoking OR have quit w/in 15years.) does not qualify.   Lung Cancer Screening Referral: no  Additional Screening:  Hepatitis C Screening: does qualify; Completed 03/03/2018  Vision Screening: Recommended annual ophthalmology exams for early detection of glaucoma and other disorders of the eye. Is the patient up to date with their annual eye exam?  No  Who is the provider or what is the name of the office in which  the patient attends annual eye exams? [facility] If pt is not established with a provider, would they like to be referred to a provider to establish care? No .   Dental Screening: Recommended annual dental exams for proper oral hygiene  Community Resource Referral / Chronic Care Management: CRR required this visit?  No   CCM required this visit?  No      Plan:     I have personally reviewed and noted the following in the patient's chart:   . Medical and social history . Use of alcohol, tobacco or illicit drugs  . Current medications and supplements . Functional ability and status . Nutritional status . Physical activity . Advanced directives . List of other physicians . Hospitalizations, surgeries, and ER visits in previous 12 months . Vitals . Screenings to include cognitive, depression, and falls . Referrals and appointments  In addition, I have reviewed and discussed with patient certain preventive protocols, quality metrics, and best practice recommendations. A written personalized care plan for preventive services as well as general preventive health recommendations were  provided to patient.     Kellie Simmering, LPN   [id number]   Nurse Notes:

## 2020-05-08 NOTE — Patient Instructions (Signed)
Summer Young , Thank you for taking time to come for your Medicare Wellness Visit. I appreciate your ongoing commitment to your health goals. Please review the following plan we discussed and let me know if I can assist you in the future.   Screening recommendations/referrals: Colonoscopy: completed 03/05/2018, due 03/06/2023 Mammogram: decline Bone Density: completed 08/16/2019 Recommended yearly ophthalmology/optometry visit for glaucoma screening and checkup Recommended yearly dental visit for hygiene and checkup  Vaccinations: Influenza vaccine: allergy Pneumococcal vaccine: completed 11/18/2017 Tdap vaccine: completed 07/16/2017, due 07/17/2027 Shingles vaccine: discussed   Covid-19:  12/17/2019, 05/12/2019, 04/21/2019  Advanced directives: Advance directive discussed with you today.   Conditions/risks identified: none  Next appointment: Follow up in one year for your annual wellness visit    Preventive Care 65 Years and Older, Female Preventive care refers to lifestyle choices and visits with your health care provider that can promote health and wellness. What does preventive care include?  A yearly physical exam. This is also called an annual well check.  Dental exams once or twice a year.  Routine eye exams. Ask your health care provider how often you should have your eyes checked.  Personal lifestyle choices, including:  Daily care of your teeth and gums.  Regular physical activity.  Eating a healthy diet.  Avoiding tobacco and drug use.  Limiting alcohol use.  Practicing safe sex.  Taking low-dose aspirin every day.  Taking vitamin and mineral supplements as recommended by your health care provider. What happens during an annual well check? The services and screenings done by your health care provider during your annual well check will depend on your age, overall health, lifestyle risk factors, and family history of disease. Counseling  Your health care provider  may ask you questions about your:  Alcohol use.  Tobacco use.  Drug use.  Emotional well-being.  Home and relationship well-being.  Sexual activity.  Eating habits.  History of falls.  Memory and ability to understand (cognition).  Work and work Statistician.  Reproductive health. Screening  You may have the following tests or measurements:  Height, weight, and BMI.  Blood pressure.  Lipid and cholesterol levels. These may be checked every 5 years, or more frequently if you are over 70 years old.  Skin check.  Lung cancer screening. You may have this screening every year starting at age 54 if you have a 30-pack-year history of smoking and currently smoke or have quit within the past 15 years.  Fecal occult blood test (FOBT) of the stool. You may have this test every year starting at age 68.  Flexible sigmoidoscopy or colonoscopy. You may have a sigmoidoscopy every 5 years or a colonoscopy every 10 years starting at age 76.  Hepatitis C blood test.  Hepatitis B blood test.  Sexually transmitted disease (STD) testing.  Diabetes screening. This is done by checking your blood sugar (glucose) after you have not eaten for a while (fasting). You may have this done every 1-3 years.  Bone density scan. This is done to screen for osteoporosis. You may have this done starting at age 4.  Mammogram. This may be done every 1-2 years. Talk to your health care provider about how often you should have regular mammograms. Talk with your health care provider about your test results, treatment options, and if necessary, the need for more tests. Vaccines  Your health care provider may recommend certain vaccines, such as:  Influenza vaccine. This is recommended every year.  Tetanus, diphtheria, and acellular pertussis (  Tdap, Td) vaccine. You may need a Td booster every 10 years.  Zoster vaccine. You may need this after age 52.  Pneumococcal 13-valent conjugate (PCV13) vaccine.  One dose is recommended after age 23.  Pneumococcal polysaccharide (PPSV23) vaccine. One dose is recommended after age 18. Talk to your health care provider about which screenings and vaccines you need and how often you need them. This information is not intended to replace advice given to you by your health care provider. Make sure you discuss any questions you have with your health care provider. Document Released: 03/02/2015 Document Revised: 10/24/2015 Document Reviewed: 12/05/2014 Elsevier Interactive Patient Education  2017 [place] Prevention in the Home Falls can cause injuries. They can happen to people of all ages. There are many things you can do to make your home safe and to help prevent falls. What can I do on the outside of my home?  Regularly fix the edges of walkways and driveways and fix any cracks.  Remove anything that might make you trip as you walk through a door, such as a raised step or threshold.  Trim any bushes or trees on the path to your home.  Use bright outdoor lighting.  Clear any walking paths of anything that might make someone trip, such as rocks or tools.  Regularly check to see if handrails are loose or broken. Make sure that both sides of any steps have handrails.  Any raised decks and porches should have guardrails on the edges.  Have any leaves, snow, or ice cleared regularly.  Use sand or salt on walking paths during winter.  Clean up any spills in your garage right away. This includes oil or grease spills. What can I do in the bathroom?  Use night lights.  Install grab bars by the toilet and in the tub and shower. Do not use towel bars as grab bars.  Use non-skid mats or decals in the tub or shower.  If you need to sit down in the shower, use a plastic, non-slip stool.  Keep the floor dry. Clean up any water that spills on the floor as soon as it happens.  Remove soap buildup in the tub or shower regularly.  Attach bath  mats securely with double-sided non-slip rug tape.  Do not have throw rugs and other things on the floor that can make you trip. What can I do in the bedroom?  Use night lights.  Make sure that you have a light by your bed that is easy to reach.  Do not use any sheets or blankets that are too big for your bed. They should not hang down onto the floor.  Have a firm chair that has side arms. You can use this for support while you get dressed.  Do not have throw rugs and other things on the floor that can make you trip. What can I do in the kitchen?  Clean up any spills right away.  Avoid walking on wet floors.  Keep items that you use a lot in easy-to-reach places.  If you need to reach something above you, use a strong step stool that has a grab bar.  Keep electrical cords out of the way.  Do not use floor polish or wax that makes floors slippery. If you must use wax, use non-skid floor wax.  Do not have throw rugs and other things on the floor that can make you trip. What can I do with my stairs?  Do  not leave any items on the stairs.  Make sure that there are handrails on both sides of the stairs and use them. Fix handrails that are broken or loose. Make sure that handrails are as long as the stairways.  Check any carpeting to make sure that it is firmly attached to the stairs. Fix any carpet that is loose or worn.  Avoid having throw rugs at the top or bottom of the stairs. If you do have throw rugs, attach them to the floor with carpet tape.  Make sure that you have a light switch at the top of the stairs and the bottom of the stairs. If you do not have them, ask someone to add them for you. What else can I do to help prevent falls?  Wear shoes that:  Do not have high heels.  Have rubber bottoms.  Are comfortable and fit you well.  Are closed at the toe. Do not wear sandals.  If you use a stepladder:  Make sure that it is fully opened. Do not climb a closed  stepladder.  Make sure that both sides of the stepladder are locked into place.  Ask someone to hold it for you, if possible.  Clearly mark and make sure that you can see:  Any grab bars or handrails.  First and last steps.  Where the edge of each step is.  Use tools that help you move around (mobility aids) if they are needed. These include:  Canes.  Walkers.  Scooters.  Crutches.  Turn on the lights when you go into a dark area. Replace any light bulbs as soon as they burn out.  Set up your furniture so you have a clear path. Avoid moving your furniture around.  If any of your floors are uneven, fix them.  If there are any pets around you, be aware of where they are.  Review your medicines with your doctor. Some medicines can make you feel dizzy. This can increase your chance of falling. Ask your doctor what other things that you can do to help prevent falls. This information is not intended to replace advice given to you by your health care provider. Make sure you discuss any questions you have with your health care provider. Document Released: 11/30/2008 Document Revised: 07/12/2015 Document Reviewed: 03/10/2014 Elsevier Interactive Patient Education  2017 [employer].

## 2020-05-09 ENCOUNTER — Encounter: Payer: Self-pay | Admitting: Family Medicine

## 2020-05-09 DIAGNOSIS — M1120 Other chondrocalcinosis, unspecified site: Secondary | ICD-10-CM | POA: Insufficient documentation

## 2020-05-09 DIAGNOSIS — M170 Bilateral primary osteoarthritis of knee: Secondary | ICD-10-CM | POA: Insufficient documentation

## 2020-05-16 ENCOUNTER — Ambulatory Visit (INDEPENDENT_AMBULATORY_CARE_PROVIDER_SITE_OTHER): Payer: PPO | Admitting: Licensed Clinical Social Worker

## 2020-05-16 ENCOUNTER — Encounter: Payer: Self-pay | Admitting: Family Medicine

## 2020-05-16 ENCOUNTER — Ambulatory Visit (INDEPENDENT_AMBULATORY_CARE_PROVIDER_SITE_OTHER): Payer: PPO | Admitting: Family Medicine

## 2020-05-16 ENCOUNTER — Other Ambulatory Visit: Payer: Self-pay

## 2020-05-16 VITALS — BP 131/75 | HR 93 | Ht 62.0 in | Wt 162.8 lb

## 2020-05-16 DIAGNOSIS — E1169 Type 2 diabetes mellitus with other specified complication: Secondary | ICD-10-CM | POA: Diagnosis not present

## 2020-05-16 DIAGNOSIS — N950 Postmenopausal bleeding: Secondary | ICD-10-CM | POA: Insufficient documentation

## 2020-05-16 DIAGNOSIS — R5383 Other fatigue: Secondary | ICD-10-CM

## 2020-05-16 DIAGNOSIS — F419 Anxiety disorder, unspecified: Secondary | ICD-10-CM

## 2020-05-16 DIAGNOSIS — I10 Essential (primary) hypertension: Secondary | ICD-10-CM | POA: Diagnosis not present

## 2020-05-16 DIAGNOSIS — E1165 Type 2 diabetes mellitus with hyperglycemia: Secondary | ICD-10-CM | POA: Diagnosis not present

## 2020-05-16 DIAGNOSIS — E785 Hyperlipidemia, unspecified: Secondary | ICD-10-CM

## 2020-05-16 LAB — POCT URINALYSIS DIPSTICK
Bilirubin, UA: NEGATIVE
Blood, UA: NEGATIVE
Glucose, UA: NEGATIVE
Ketones, UA: NEGATIVE
Leukocytes, UA: NEGATIVE
Nitrite, UA: NEGATIVE
Protein, UA: POSITIVE — AB
Spec Grav, UA: 1.01 (ref 1.010–1.025)
Urobilinogen, UA: 0.2 E.U./dL
pH, UA: 7 (ref 5.0–8.0)

## 2020-05-16 NOTE — Chronic Care Management (AMB) (Signed)
Chronic Care Management    Clinical Social Work Note  05/16/2020 Name: Summer Young MRN: [id number] DOB: 14-Jan-1951  Prabhleen Montemayor is a 70 y.o. year old female who is a primary care patient of [name], Lupita Raider, FNP. The CCM team was consulted to assist the patient with chronic disease management and/or care coordination needs related to: Mental Health Counseling and Resources.   Engaged with patient by telephone for follow up visit in response to provider referral for social work chronic care management and care coordination services.   Consent to Services:  The patient was given the following information about Chronic Care Management services today, agreed to services, and gave verbal consent: 1. CCM service includes personalized support from designated clinical staff supervised by the primary care provider, including individualized plan of care and coordination with other care providers 2. 24/7 contact phone numbers for assistance for urgent and routine care needs. 3. Service will only be billed when office clinical staff spend 20 minutes or more in a month to coordinate care. 4. Only one practitioner may furnish and bill the service in a calendar month. 5.The patient may stop CCM services at any time (effective at the end of the month) by phone call to the office staff. 6. The patient will be responsible for cost sharing (co-pay) of up to 20% of the service fee (after annual deductible is met). Patient agreed to services and consent obtained.  Patient agreed to services and consent obtained.   Assessment: Review of patient past medical history, allergies, medications, and health status, including review of relevant consultants reports was performed today as part of a comprehensive evaluation and provision of chronic care management and care coordination services.     SDOH (Social Determinants of Health) assessments and interventions performed:    Advanced Directives Status: See Care Plan for  related entries.  CCM Care Plan  Allergies  Allergen Reactions  . Aspirin Hives    Other reaction(s): Other (See Comments) Other Reaction: Not Assessed   . Insulin Detemir Hives and Rash    Errythema, edema, heat at site of injection did not improve after 2 weeks of use.    . Shellfish Allergy Anaphylaxis  . Dapagliflozin Other (See Comments)    Yeast infections Yeast infection Farxiga  . Influenza Vaccines Other (See Comments)    Forxiga  . Other     Other reaction(s): Other (See Comments) Uncoded Allergy. Allergen: Shellfish, Other Reaction: Not Assessed    Flu vaccine given 1998 caused anaphylaxis    Outpatient Encounter Medications as of 05/16/2020  Medication Sig Note  . acetaminophen (TYLENOL) 500 MG tablet Take 500 mg by mouth at bedtime as needed.   Marland Kitchen alendronate (FOSAMAX) 70 MG tablet Take 1 tablet (70 mg total) by mouth once a week. Take with a full glass of water on an empty stomach.   . Ascorbic Acid (VITAMIN C) 1000 MG tablet Take 1,000 mg by mouth daily as needed.  09/20/2018: As needed to prevent illness  . atorvastatin (LIPITOR) 40 MG tablet Take 1 tablet (40 mg total) by mouth daily.   . Biotin 1000 MCG tablet Take 4,000 mcg by mouth every other day.  01/19/2018: Takes every other day  . blood glucose meter kit and supplies Dispense based on patient and insurance preference. Use up to four times daily as directed. (FOR ICD-10 E10.9, E11.9).   Marland Kitchen Cholecalciferol (VITAMIN D3) 50 MCG (2000 UT) capsule Take 1,000 Units by mouth daily.    . diclofenac Sodium (  VOLTAREN) 1 % GEL Apply 2 g topically 3 (three) times daily as needed.   . insulin degludec (TRESIBA FLEXTOUCH) 100 UNIT/ML SOPN FlexTouch Pen Inject 0.26 mLs (26 Units total) into the skin daily. (Patient taking differently: Inject 20 Units into the skin daily.)   . metFORMIN (GLUCOPHAGE-XR) 500 MG 24 hr tablet TAKE 3 TABLETS BY MOUTH ONCE DAILY WITH BREAKFAST (Patient taking differently: Patient is taking 2 tablets  daily)   . ONETOUCH ULTRA test strip Check blood sugar 1 x daily as directed   . polyethylene glycol powder (GLYCOLAX/MIRALAX) 17 GM/SCOOP powder Take 17 g by mouth 2 (two) times daily as needed. (Patient not taking: Reported on 05/08/2020)   . Semaglutide, 1 MG/DOSE, (OZEMPIC, 1 MG/DOSE,) 2 MG/1.5ML SOPN INJECT 1 MG SUBCUTANEOUSLY ONCE A WEEK   . Skin Protectants, Misc. (AMERIDERM PERISHIELD) 3.8 % OINT Apply to skin under breast as needed   . spironolactone (ALDACTONE) 25 MG tablet Take 1 tablet (25 mg total) by mouth daily.   . vitamin B-12 (CYANOCOBALAMIN) 1000 MCG tablet Take 1,000 mcg by mouth daily.    . vitamin E 400 UNIT capsule Take by mouth. (Patient not taking: Reported on 05/08/2020)    No facility-administered encounter medications on file as of 05/16/2020.    Patient Active Problem List   Diagnosis Date Noted  . Osteoarthritis of knees, bilateral 05/09/2020  . Calcium pyrophosphate deposition disease (CPPD) 05/09/2020  . Cough 01/18/2020  . Fever 01/18/2020  . Acute back pain 10/28/2019  . Immunization due 09/19/2019  . Osteoporosis 08/16/2019  . Screening for osteoporosis 06/16/2019  . Screening for breast cancer 06/16/2019  . Fatigue 05/03/2019  . 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 Allergic rhinitis 02/26/2018  . Hyperlipidemia 02/26/2018  . Diabetes mellitus ([place]) 02/26/2018  . Abdominal pain 02/26/2018  . Cataract cortical, senile 01/18/2018  . Seasonal allergic rhinitis due to pollen 07/24/2016  . Chronic bilateral low back pain without sciatica 11/22/2014  . Chronic pain of both knees 11/22/2014  . Essential hypertension 11/22/2014  . Dyslipidemia associated with type 2 diabetes mellitus ([place]) 11/22/2014  . Type 2 diabetes mellitus with hyperglycemia ([place]) 01/19/2013    Conditions to be addressed/monitored: Anxiety and Depression; Mental Health Concerns   Care Plan : General Social Work (Adult)  Updates made by  Greg Cutter, LCSW since 05/16/2020 12:00 AM    Problem: Depression Identification (Depression)     Long-Range Goal: Depressive Symptoms Identified   Start Date: 01/24/2020  Recent Progress: On track  Priority: Medium  Note:   Timeframe:  Long-Range Goal Priority:  Medium  Start Date:    03/28/20                      Expected End Date:   06/25/20               Follow Up Date -06/13/20   - avoid negative self-talk - develop a personal safety plan - develop a plan to deal with triggers like holidays, anniversaries - exercise at least 2 to 3 times per week - have a plan for how to handle bad days - journal feelings and what helps to feel better or worse - spend time or talk with others at least 2 to 3 times per week - spend time or talk with others every day - watch for early signs of feeling worse - write in journal every day    Why is this important?  Keeping track of your progress will help your treatment team find the right mix of medicine and therapy for you.   Write in your journal every day.   Day-to-day changes in depression symptoms are normal. It may be more helpful to check your progress at the end of each week instead of every day.     Current Barriers:  . Acute Mental Health needs related to anxiety/stress . Limited social support . Mental Health Concerns  . Family and relationship dysfunction . Social Isolation . Suicidal Ideation/Homicidal Ideation: No  Clinical Social Work Goal(s):  Marland Kitchen Over the next 120 days, patient will work with SW bi-monthly by telephone or in person to reduce or manage symptoms related to anxiety  . Over the next 120 days, patient will work with SW to address concerns related to gaining mental health support and develop better coping skills to manage symptoms  Interventions: . Patient interviewed and appropriate assessments performed: GAD 7 and PHQ 9. Scores have improved drastically overt the past few months. Patient reports that she  is not experiencing any signs of anxiety or depression at this time. She shares that she has several trips planned for this summer which she believes has contributed to this. Patient will go to [place] on 06/23/20 for her step daughter's wedding and then a few weeks later patient will visit her country [place] and spend time with family which will increase her ability to cope with anxiety/stress/triggers more effectively.  . Patient was informed that current CCM LCSW will be leaving position next month and her next CCM Social Work follow up visit will be with another LCSW. Patient was appreciative of support provided and receptive to news . Patient reports that she is on way to see PCP for office visit appointment for vaginal discomfort. She reports that she is experiencing chronic bleeding and cramps. Patient also is struggling with acute pain in her knees and back. Pain management coping skill education provided.  . 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 Patient shares that she will go there several times per week to gain socialization, relaxation from massage chair and exercise. Positive reinforcement provided for this self-care implementation.  . Patient shares that she wishes to find a local church in order to gain additional positive support. She has been invited to several churches but they are too far of a drive. Patient wishes to disconnect from all negative influences in her life. LCSW provided education on healthy self-care and boundary implementation. Patient shares that she is no longer attending the church she recently found because they were not following the mask mandate. Patient shares that she has a found a small support group that offers ministry. She plans to start attending this service soon. . Patient shares that she is effectively implementing healthy eating habits into her daily routine to increase her self-care. Positive reinforcement provided for  this achievement.  Marland Kitchen LCSW provided education on relaxation techniques such as meditation, deep breathing, massage, or yoga that can activate the body's relaxation response and ease symptoms of acute anxiety. LCSW ask that when pt is struggling with difficult emotions and stress that she start this relaxation response process.  . Discussed plans with patient for ongoing care management follow up and provided patient with direct contact information for care management team . Advised patient to contact CCM program for any urgent case management needs . Assisted patient/caregiver with obtaining information about health plan benefits . A voluntary and extensive discussion about  advanced care planning including explanation and discussion of advanced was undertaken with the patient. Explanation regarding healthcare proxy and living will was reviewed and packet with forms with explanation of how to fill them out was given.   . Encouraged patient to consider long term follow up therapy/counseling . Brief CBTprovided during session  . LCSW has sent a secure email with mental health resources during a previous outreach. LCSW provided brief mental health resource education during session but patient again denies wanting or needing to pursue these resources at this time but was appreciative of the resource education.  . 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 She reports that she is trying to build back up her clients and hours.  . Patient reports that she has been actively talking to her support network which includes both of her sisters.  . Patient LCSW discussed coping skills for anxiety. SW used empathetic and active and reflective listening, validated patient's feelings/concerns, and provided emotional support. LCSW provided self-care examples to help patient manage their multiple health conditions and improve her mood.   . Patient shares that she was recently prescribed Ambien to treat her insomnia. Patient reports that she is actively working on her sleep routine. LCSW provided education on healthy sleep hygiene and what that looks like. LCSW encouraged patient to implement a night time routine into her schedule that works best for her and that she is able to maintain. Advised patient to implement deep breathing/grounding/meditation/self-care exercises into her nightly routine to combat racing thoughts at night. LCSW encouraged patient to wake up at the same time each day, make her sleeping environment comfortable, exercise when able, to limit naps and to not eat or drink anything right before bed.   Patient Self Care Activities:  . Attends all scheduled provider appointments  Patient Coping Strengths:  . Spirituality . Hopefulness  Patient Self Care Deficits:  . Lack of socialization and strong support network  Please see past updates related to this goal by clicking on the "Past Updates" button in the selected goal        Follow Up Plan: SW will follow up with patient by phone over the next quarter      Eula Fried, [place], MSW, [email or web address] Phone: [phone]

## 2020-05-16 NOTE — Progress Notes (Signed)
Subjective:    Patient ID: Summer Young, female    DOB: [birth year], 70 y.o.   MRN: [id number]  Summer Young is a 70 y.o. female presenting on 05/16/2020 for Vaginal Bleeding   HPI   Postmenopausal Vaginal Bleeding Reports about 3 weeks ago first onset x 3 episodes with vaginal bleeding with some fresh blood pinkish color. Admits some pelvic cramping, like a menstrual cramp discomfort without pain History of prior miscarriage x 4 - back in 1998 Denies any urinary frequency, dysuria    Past Surgical History:  Procedure Laterality Date  . ANKLE SURGERY Left 2009  . DILATION AND CURETTAGE, DIAGNOSTIC / THERAPEUTIC  1998   miscarriage  . KNEE SURGERY Left 1994   ACL     Depression screen [facility] 2/9 05/16/2020 05/08/2020 11/29/2019  Decreased Interest 0 0 0  Down, Depressed, Hopeless 0 0 0  PHQ - 2 Score 0 0 0  Altered sleeping - - -  Tired, decreased energy - - -  Change in appetite - - -  Feeling bad or failure about yourself  - - -  Trouble concentrating - - -  Moving slowly or fidgety/restless - - -  Suicidal thoughts - - -  PHQ-9 Score - - -  Difficult doing work/chores - - -    Social History   Tobacco Use  . Smoking status: Never Smoker  . Smokeless tobacco: Never Used  Vaping Use  . Vaping Use: Never used  Substance Use Topics  . Alcohol use: Yes    Alcohol/week: 0.0 - 1.0 standard drinks    Comment: occasional  . Drug use: Never    Review of Systems Per HPI unless specifically indicated above     Objective:    BP 131/75   Pulse 93   Ht 5\' 2"  (1.575 m)   Wt 162 lb 12.8 oz (73.8 kg)   SpO2 100%   BMI 29.78 kg/m   Wt Readings from Last 3 Encounters:  05/16/20 162 lb 12.8 oz (73.8 kg)  05/08/20 161 lb (73 kg)  04/30/20 162 lb 3.2 oz (73.6 kg)    Physical Exam Vitals and nursing note reviewed.  Constitutional:      General: She is not in acute distress.    Appearance: She is well-developed. She is not diaphoretic.     Comments: Well-appearing,  comfortable, cooperative  HENT:     Head: Normocephalic and atraumatic.  Eyes:     General:        Right eye: No discharge.        Left eye: No discharge.     Conjunctiva/sclera: Conjunctivae normal.  Cardiovascular:     Rate and Rhythm: Normal rate.  Pulmonary:     Effort: Pulmonary effort is normal.  Abdominal:     General: Bowel sounds are normal. There is no distension.     Palpations: Abdomen is soft. There is no mass.     Tenderness: There is no abdominal tenderness.  Skin:    General: Skin is warm and dry.     Findings: No erythema or rash.  Neurological:     Mental Status: She is alert and oriented to person, place, and time.  Psychiatric:        Behavior: Behavior normal.     Comments: Well groomed, good eye contact, normal speech and thoughts    Results for orders placed or performed in visit on 05/16/20  POCT Urinalysis Dipstick  Result Value Ref Range   Color, UA  Yellow    Clarity, UA Clear    Glucose, UA Negative Negative   Bilirubin, UA Negative    Ketones, UA Negative    Spec Grav, UA 1.010 1.010 - 1.025   Blood, UA Negative    pH, UA 7.0 5.0 - 8.0   Protein, UA Positive (A) Negative   Urobilinogen, UA 0.2 0.2 or 1.0 E.U./dL   Nitrite, UA Negative    Leukocytes, UA Negative Negative   Appearance     Odor        Assessment & Plan:   Problem List Items Addressed This Visit    Postmenopausal bleeding - Primary   Relevant Orders   POCT Urinalysis Dipstick (Completed)   Ambulatory referral to Obstetrics / Gynecology     UA dipstick reviewed, only positive for protein, no blood on urine dipstick. No sign of UTI No culture needed.   postmenopausal vaginal bleeding x 3 episodes in past 3 weeks, new problem, some associated pelvic cramping, no urinary problem, no other systemic symptoms. No prior history of this. She went through menopause early 2001, she has history of miscarriages x4 in past D&C in 1998. No other GYN surgeries. Will need pelvic eval  and ultrasound for postmenopausal bleeding patient to rule out possible malignancy or identify other source of bleed - requesting to see GYN Specialist.   - referral is ordered.  Orders Placed This Encounter  Procedures  . Ambulatory referral to Obstetrics / Gynecology    Referral Priority:   Routine    Referral Type:   Consultation    Referral Reason:   Specialty Services Required    Requested Specialty:   Obstetrics and Gynecology    Number of Visits Requested:   1  . POCT Urinalysis Dipstick      No orders of the defined types were placed in this encounter.     Follow up plan: Return if symptoms worsen or fail to improve, for vaginal bleeding.    Nobie Putnam, [facility] 05/16/2020, 10:55 AM

## 2020-05-16 NOTE — Patient Instructions (Addendum)
Thank you for coming to the office today.  Stay tuned for apt with GYN specialist for the bleeding. Can do pelvic exam and ultrasound evaluation.  [facility] [address], [place], [place] [address] Hours: Nena Pol[place] Main: [place]   Address: [address], [place], [place], [place], [place] [address] Hours: 8AM-5PM Phone: [phone]  They will call you.   Please schedule a Follow-up Appointment to: Return if symptoms worsen or fail to improve, for vaginal bleeding.  If you have any other questions or concerns, please feel free to call the office or send a message through [place]. You may also schedule an earlier appointment if necessary.  Additionally, you may be receiving a survey about your experience at our office within a few days to 1 week by e-mail or mail. We value your feedback.  Nobie Putnam, DO [place]

## 2020-05-16 NOTE — Patient Instructions (Signed)
Licensed Clinical Social Worker Visit Information  Goals we discussed today:  Goals Addressed            This Visit's Progress   . Track and Manage My Symptoms-Depression       Timeframe:  Long-Range Goal Priority:  Medium  Start Date:    03/28/20                      Expected End Date:   06/25/20               Follow Up Date -06/13/20   - avoid negative self-talk - develop a personal safety plan - develop a plan to deal with triggers like holidays, anniversaries - exercise at least 2 to 3 times per week - have a plan for how to handle bad days - journal feelings and what helps to feel better or worse - spend time or talk with others at least 2 to 3 times per week - spend time or talk with others every day - watch for early signs of feeling worse - write in journal every day    Why is this important?    Keeping track of your progress will help your treatment team find the right mix of medicine and therapy for you.   Write in your journal every day.   Day-to-day changes in depression symptoms are normal. It may be more helpful to check your progress at the end of each week instead of every day.     Current Barriers:  . Acute Mental Health needs related to anxiety/stress . Limited social support . Mental Health Concerns  . Family and relationship dysfunction . Social Isolation . Suicidal Ideation/Homicidal Ideation: No  Clinical Social Work Goal(s):  Marland Kitchen Over the next 120 days, patient will work with SW bi-monthly by telephone or in person to reduce or manage symptoms related to anxiety  . Over the next 120 days, patient will work with SW to address concerns related to gaining mental health support and develop better coping skills to manage symptoms  Interventions: . Patient interviewed and appropriate assessments performed: GAD 7 and PHQ 9. Scores have improved drastically overt the past few months. Patient reports that she is not experiencing any signs of anxiety or  depression at this time. She shares that she has several trips planned for this summer which she believes has contributed to this. Patient will go to [place] on 06/23/20 for her step daughter's wedding and then a few weeks later patient will visit her country [place] and spend time with family which will increase her ability to cope with anxiety/stress/triggers more effectively.  . Patient was informed that current CCM LCSW will be leaving position next month and her next CCM Social Work follow up visit will be with another LCSW. Patient was appreciative of support provided and receptive to news . Patient reports that she is on way to see PCP for office visit appointment for vaginal discomfort. She reports that she is experiencing chronic bleeding and cramps. Patient also is struggling with acute pain in her knees and back. Pain management coping skill education provided.  . 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 Patient shares that she will go there several times per week to gain socialization, relaxation from massage chair and exercise. Positive reinforcement provided for this self-care implementation.  . Patient shares that she wishes to find a local church in order to gain additional positive support. She has  been invited to several churches but they are too far of a drive. Patient wishes to disconnect from all negative influences in her life. LCSW provided education on healthy self-care and boundary implementation. Patient shares that she is no longer attending the church she recently found because they were not following the mask mandate. Patient shares that she has a found a small support group that offers ministry. She plans to start attending this service soon. . Patient shares that she is effectively implementing healthy eating habits into her daily routine to increase her self-care. Positive reinforcement provided for this achievement.  Marland Kitchen LCSW provided education  on relaxation techniques such as meditation, deep breathing, massage, or yoga that can activate the body's relaxation response and ease symptoms of acute anxiety. LCSW ask that when pt is struggling with difficult emotions and stress that she start this relaxation response process.  . Discussed plans with patient for ongoing care management follow up and provided patient with direct contact information for care management team . Advised patient to contact CCM program for any urgent case management needs . Assisted patient/caregiver with obtaining information about health plan benefits . A voluntary and extensive discussion about advanced care planning including explanation and discussion of advanced was undertaken with the patient. Explanation regarding healthcare proxy and living will was reviewed and packet with forms with explanation of how to fill them out was given.   . Encouraged patient to consider long term follow up therapy/counseling . Brief CBTprovided during session  . LCSW has sent a secure email with mental health resources during a previous outreach. LCSW provided brief mental health resource education during session but patient again denies wanting or needing to pursue these resources at this time but was appreciative of the resource education.  . 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 She reports that she is trying to build back up her clients and hours.  . Patient reports that she has been actively talking to her support network which includes both of her sisters.  . Patient LCSW discussed coping skills for anxiety. SW used empathetic and active and reflective listening, validated patient's feelings/concerns, and provided emotional support. LCSW provided self-care examples to help patient manage their multiple health conditions and improve her mood.  . Patient shares that she was recently prescribed  Ambien to treat her insomnia. Patient reports that she is actively working on her sleep routine. LCSW provided education on healthy sleep hygiene and what that looks like. LCSW encouraged patient to implement a night time routine into her schedule that works best for her and that she is able to maintain. Advised patient to implement deep breathing/grounding/meditation/self-care exercises into her nightly routine to combat racing thoughts at night. LCSW encouraged patient to wake up at the same time each day, make her sleeping environment comfortable, exercise when able, to limit naps and to not eat or drink anything right before bed.   Patient Self Care Activities:  . Attends all scheduled provider appointments  Patient Coping Strengths:  . Spirituality . Hopefulness  Patient Self Care Deficits:  . Lack of socialization and strong support network  Please see past updates related to this goal by clicking on the "Past Updates" button in the selected goal        Eula Fried, [place], MSW, [email or web address] Phone: [phone]

## 2020-05-17 ENCOUNTER — Telehealth: Payer: Self-pay

## 2020-05-17 NOTE — Telephone Encounter (Signed)
Summer Young medical referring for Postmenopausal bleeding. Called and left voicemail for patient to call back to be scheduled.

## 2020-05-25 ENCOUNTER — Other Ambulatory Visit: Payer: Self-pay

## 2020-05-25 DIAGNOSIS — E1165 Type 2 diabetes mellitus with hyperglycemia: Secondary | ICD-10-CM

## 2020-05-25 MED ORDER — METFORMIN HCL ER 500 MG PO TB24: ORAL_TABLET | ORAL | 1 refills | Status: DC

## 2020-05-31 ENCOUNTER — Telehealth: Payer: Self-pay

## 2020-06-01 ENCOUNTER — Other Ambulatory Visit: Payer: Self-pay

## 2020-06-01 DIAGNOSIS — E1165 Type 2 diabetes mellitus with hyperglycemia: Secondary | ICD-10-CM

## 2020-06-01 DIAGNOSIS — Z794 Long term (current) use of insulin: Secondary | ICD-10-CM

## 2020-06-01 MED ORDER — METFORMIN HCL ER 500 MG PO TB24: ORAL_TABLET | ORAL | 1 refills | Status: DC

## 2020-06-05 ENCOUNTER — Other Ambulatory Visit: Payer: Self-pay

## 2020-06-05 ENCOUNTER — Ambulatory Visit: Payer: PPO | Admitting: Obstetrics and Gynecology

## 2020-06-05 ENCOUNTER — Encounter: Payer: Self-pay | Admitting: Obstetrics and Gynecology

## 2020-06-05 VITALS — BP 122/70 | Ht 62.0 in | Wt 163.4 lb

## 2020-06-05 DIAGNOSIS — R875 Abnormal microbiological findings in specimens from female genital organs: Secondary | ICD-10-CM | POA: Diagnosis not present

## 2020-06-05 DIAGNOSIS — Z1231 Encounter for screening mammogram for malignant neoplasm of breast: Secondary | ICD-10-CM

## 2020-06-05 DIAGNOSIS — N950 Postmenopausal bleeding: Secondary | ICD-10-CM | POA: Diagnosis not present

## 2020-06-05 DIAGNOSIS — N930 Postcoital and contact bleeding: Secondary | ICD-10-CM

## 2020-06-05 NOTE — Progress Notes (Signed)
Patient ID: Summer Young, female   DOB: [birth year], 70 y.o.   MRN: [id number]  Reason for Consult: Gynecologic Exam   Referred by Saralyn Pilar *  Subjective:     HPI:  Summer Young is a 70 y.o. female. She was referred for postmenopausal bleeding. She reports that the bleeding started last month. It was small in amount, not enough to fill a bad. There was pink to red discharge when wiping after intercourse. She has not been having pain with intercourse.   Gynecological History  No LMP recorded. Patient is postmenopausal. Menarche: 16 Menopause: 47  She denies passage of large clots She denies sensations of gushing or flooding of blood. She denies accidents where she bleeds through her clothing. She denies that she changes a saturated pad or tampon more frequently than every hour.   History of fibroids, polyps, or ovarian cysts? : no  History of PCOS? no Hstory of Endometriosis? no History of abnormal pap smears? no Have you had any sexually transmitted infections in the past? no  Last Pap: Results were: 2020 NIL HPV negative   She identifies as a female. She is sexually active with men.   She denies dyspareunia. She has postcoital bleeding.  She currently uses none for contraception.    Past Medical History:  Diagnosis Date  . Allergy   . Chronic pain of both knees   . Diabetes mellitus without complication (HCC)   . Hyperlipidemia   . Hypertension    Family History  Problem Relation Age of Onset  . Diabetes Mother   . Diabetes Father   . Stroke Father   . Diabetes Sister   . Heart attack Other   . Diabetes Sister   . Diabetes Sister   . Diabetes Sister    Past Surgical History:  Procedure Laterality Date  . ANKLE SURGERY Left 2009  . DILATION AND CURETTAGE, DIAGNOSTIC / THERAPEUTIC  1998   miscarriage  . KNEE SURGERY Left 1994   ACL    Short Social History:  Social History   Tobacco Use  . Smoking status: Never Smoker  . Smokeless  tobacco: Never Used  Substance Use Topics  . Alcohol use: Yes    Alcohol/week: 0.0 - 1.0 standard drinks    Comment: occasional    Allergies  Allergen Reactions  . Aspirin Hives    Other reaction(s): Other (See Comments) Other Reaction: Not Assessed   . Insulin Detemir Hives and Rash    Errythema, edema, heat at site of injection did not improve after 2 weeks of use.    . Shellfish Allergy Anaphylaxis  . Dapagliflozin Other (See Comments)    Yeast infections Yeast infection Farxiga  . Influenza Vaccines Other (See Comments)    Forxiga  . Other     Other reaction(s): Other (See Comments) Uncoded Allergy. Allergen: Shellfish, Other Reaction: Not Assessed    Flu vaccine given 1998 caused anaphylaxis    Current Outpatient Medications  Medication Sig Dispense Refill  . atorvastatin (LIPITOR) 40 MG tablet Take 1 tablet (40 mg total) by mouth daily. 90 tablet 0  . blood glucose meter kit and supplies Dispense based on patient and insurance preference. Use up to four times daily as directed. (FOR ICD-10 E10.9, E11.9). 1 each 0  . Cholecalciferol (VITAMIN D3) 50 MCG (2000 UT) capsule Take 1,000 Units by mouth daily.     . metFORMIN (GLUCOPHAGE-XR) 500 MG 24 hr tablet TAKE 3 TABLETS BY MOUTH ONCE DAILY WITH BREAKFAST 270 tablet  1  . ONETOUCH ULTRA test strip Check blood sugar 1 x daily as directed 100 each 12  . polyethylene glycol powder (GLYCOLAX/MIRALAX) 17 GM/SCOOP powder Take 17 g by mouth 2 (two) times daily as needed. 3350 g 1  . Semaglutide, 1 MG/DOSE, (OZEMPIC, 1 MG/DOSE,) 2 MG/1.5ML SOPN INJECT 1 MG SUBCUTANEOUSLY ONCE A WEEK 4 mL 0  . Skin Protectants, Misc. (AMERIDERM PERISHIELD) 3.8 % OINT Apply to skin under breast as needed 100 g 5  . spironolactone (ALDACTONE) 25 MG tablet Take 1 tablet (25 mg total) by mouth daily. 90 tablet 0  . vitamin B-12 (CYANOCOBALAMIN) 1000 MCG tablet Take 1,000 mcg by mouth daily.     . vitamin E 400 UNIT capsule Take by mouth.    Marland Kitchen  acetaminophen (TYLENOL) 500 MG tablet Take 500 mg by mouth at bedtime as needed.    Marland Kitchen alendronate (FOSAMAX) 70 MG tablet Take 1 tablet (70 mg total) by mouth once a week. Take with a full glass of water on an empty stomach. 12 tablet 1  . Ascorbic Acid (VITAMIN C) 1000 MG tablet Take 1,000 mg by mouth daily as needed.     . Biotin 1000 MCG tablet Take 4,000 mcg by mouth every other day.     . diclofenac Sodium (VOLTAREN) 1 % GEL Apply 2 g topically 3 (three) times daily as needed. 100 g 2  . insulin degludec (TRESIBA FLEXTOUCH) 100 UNIT/ML SOPN FlexTouch Pen Inject 0.26 mLs (26 Units total) into the skin daily. (Patient taking differently: Inject 20 Units into the skin daily.) 3 pen 5   No current facility-administered medications for this visit.    Review of Systems  Constitutional: Negative for chills, fatigue, fever and unexpected weight change.  HENT: Negative for trouble swallowing.  Eyes: Negative for loss of vision.  Respiratory: Negative for cough, shortness of breath and wheezing.  Cardiovascular: Negative for chest pain, leg swelling, palpitations and syncope.  GI: Negative for abdominal pain, blood in stool, diarrhea, nausea and vomiting.  GU: Negative for difficulty urinating, dysuria, frequency and hematuria.  Musculoskeletal: Negative for back pain, leg pain and joint pain.  Skin: Negative for rash.  Neurological: Negative for dizziness, headaches, light-headedness, numbness and seizures.  Psychiatric: Negative for behavioral problem, confusion, depressed mood and sleep disturbance.        Objective:  Objective   Vitals:   06/05/20 1055  BP: 122/70  Weight: 163 lb 6.4 oz (74.1 kg)  Height: $Remove'5\' 2"'rkUOmCm$  (1.575 m)   Body mass index is 29.89 kg/m.  Physical Exam Vitals and nursing note reviewed. Exam conducted with a chaperone present.  Constitutional:      Appearance: Normal appearance. She is well-developed.  HENT:     Head: Normocephalic and atraumatic.  Eyes:      Extraocular Movements: Extraocular movements intact.     Pupils: Pupils are equal, round, and reactive to light.  Cardiovascular:     Rate and Rhythm: Normal rate and regular rhythm.  Pulmonary:     Effort: Pulmonary effort is normal. No respiratory distress.     Breath sounds: Normal breath sounds.  Abdominal:     General: Abdomen is flat.     Palpations: Abdomen is soft.  Genitourinary:    Comments: External: Normal appearing vulva. No lesions noted.  Speculum examination: Normal appearing cervix. No blood in the vaginal vault. NO discharge.   Bimanual examination: Uterus midline, non-tender, normal in size, shape and contour.  No CMT. Slight right adnexal enlargement.  No adnexal tenderness. Pelvis not fixed.  Breast exam: exam not performed Musculoskeletal:        General: No signs of injury.  Skin:    General: Skin is warm and dry.  Neurological:     Mental Status: She is alert and oriented to person, place, and time.  Psychiatric:        Behavior: Behavior normal.        Thought Content: Thought content normal.        Judgment: Judgment normal.     Assessment/Plan:     70 yo with postmenopausal bleeding We discussed that postmenopausal bleeding can occur from atrophy, infection, polyps, fibroids, uterine or cervical cancer, or dysplasia. Will check for pelvic infection given symptoms of itching. Pap smear is up to date and her cervix was normal in appearance. Will follow up after pelvic US. If her endometrium in thickened, more than 4 mm we will plan endometrial sampling. [place] availability is limited currently by office and hospital sonographer availability.    More than 30 minutes were spent face to face with the patient in the room, reviewing the medical record, labs and images, and coordinating care for the patient. The plan of management was discussed in detail and counseling was provided.   Adrian Prows MD [facility], [facility] 06/05/2020 11:45 AM

## 2020-06-06 ENCOUNTER — Ambulatory Visit (INDEPENDENT_AMBULATORY_CARE_PROVIDER_SITE_OTHER): Payer: PPO | Admitting: Pharmacist

## 2020-06-06 ENCOUNTER — Other Ambulatory Visit: Payer: Self-pay | Admitting: Family Medicine

## 2020-06-06 DIAGNOSIS — E1165 Type 2 diabetes mellitus with hyperglycemia: Secondary | ICD-10-CM | POA: Diagnosis not present

## 2020-06-06 DIAGNOSIS — E782 Mixed hyperlipidemia: Secondary | ICD-10-CM | POA: Diagnosis not present

## 2020-06-06 MED ORDER — METFORMIN HCL ER 500 MG PO TB24
ORAL_TABLET | ORAL | 3 refills | Status: DC
Start: 2020-06-06 — End: 2021-02-04

## 2020-06-06 NOTE — Patient Instructions (Signed)
Visit Information  PATIENT GOALS: Goals Addressed            This Visit's Progress   . Pharmacy Goals       Our goal A1c is less than 7%. This corresponds with fasting sugars less than 130 and 2 hour after meal sugars less than 180. Please check your blood sugars and keep log of results  Our goal bad cholesterol, or LDL, is less than 70 . This is why it is important to continue taking your atorvastatin  Please check your home blood pressure, keep a log of the results and bring this with you to your medical appointments.  Feel free to call me with any questions or concerns. I look forward to our next call!    [name], PharmD, [place] [phone]        The patient verbalized understanding of instructions, educational materials, and care plan provided today and declined offer to receive copy of patient instructions, educational materials, and care plan.   Telephone follow up appointment with care management team member scheduled for:  08/06/2020 at 8:30 AM  [name], PharmD, [place], [place] [phone]

## 2020-06-06 NOTE — Chronic Care Management (AMB) (Signed)
Chronic Care Management Pharmacy Note  06/06/2020 Name:  Summer Young MRN:  [id number] DOB:  01-24-1951  Subjective: Summer Young is an 71 y.o. year old female who is a primary patient of Malfi, Lupita Raider, FNP.  The CCM team was consulted for assistance with disease management and care coordination needs.    Engaged with patient by telephone for follow up visit in response to provider referral for pharmacy case management and/or care coordination services.   Consent to Services:  The patient was given information about Chronic Care Management services, agreed to services, and gave verbal consent prior to initiation of services.  Please see initial visit note for detailed documentation.   Patient Care Team: [name], FNP as PCP - General (Family Medicine) [place], Virl Diamond, RPH-CPP as Pharmacist Greg Cutter, LCSW as [place] [facility] (Licensed Clinical Social Worker) [name], RN as Case Manager (General Practice)  Recent office visits: Office Visit with Dr. Nobie Putnam on 3/14 for knee/back pain and T2DM follow up Office Visit with Dr. Nobie Putnam on 3/30 for postmenopausal vaginal bleeding  Recent consult visits: Office Visit with [facility] on 4/19  Hospital visits: None in previous 6 months  Objective:  Lab Results  Component Value Date   CREATININE 0.82 04/12/2019   CREATININE 0.79 06/10/2018   CREATININE 0.87 03/03/2018    Lab Results  Component Value Date   HGBA1C 7.2 (A) 04/30/2020   Last diabetic Eye exam:  Lab Results  Component Value Date/Time   HMDIABEYEEXA No Retinopathy 12/27/2018 12:00 AM    Last diabetic Foot exam: No results found for: HMDIABFOOTEX      Component Value Date/Time   CHOL 104 04/12/2019 0818   TRIG 63 04/12/2019 0818   HDL 45 (L) 04/12/2019 0818   CHOLHDL 2.3 04/12/2019 0818   LDLCALC 45 04/12/2019 0818    Hepatic Function Latest Ref Rng & Units 04/12/2019  06/10/2018 03/03/2018  Total Protein 6.1 - 8.1 g/dL 7.2 6.7 6.5  AST 10 - 35 U/L $Remo'18 18 18  'GnKLr$ ALT 6 - 29 U/L $Remo'17 20 19  'ibabT$ Total Bilirubin 0.2 - 1.2 mg/dL 0.5 0.5 0.5    Social History   Tobacco Use  Smoking Status Never Smoker  Smokeless Tobacco Never Used   BP Readings from Last 3 Encounters:  06/05/20 122/70  05/16/20 131/75  04/30/20 136/79   Pulse Readings from Last 3 Encounters:  05/16/20 93  04/30/20 86  01/18/20 92   Wt Readings from Last 3 Encounters:  06/05/20 163 lb 6.4 oz (74.1 kg)  05/16/20 162 lb 12.8 oz (73.8 kg)  05/08/20 161 lb (73 kg)    Assessment: Review of patient past medical history, allergies, medications, health status, including review of consultants reports, laboratory and other test data, was performed as part of comprehensive evaluation and provision of chronic care management services.   SDOH:  (Social Determinants of Health) assessments and interventions performed:  SDOH Interventions   Flowsheet Row Most Recent Value  SDOH Interventions   SDOH Interventions for the Following Domains Physical Activity  Physical Activity Interventions Other (Comments)  [Encourage patient - plans to restart going to gym to increase exercise and work on core strength]      CCM Care Plan  Allergies  Allergen Reactions  . Aspirin Hives    Other reaction(s): Other (See Comments) Other Reaction: Not Assessed   . Insulin Detemir Hives and Rash    Errythema, edema, heat at site of  injection did not improve after 2 weeks of use.    . Shellfish Allergy Anaphylaxis  . Dapagliflozin Other (See Comments)    Yeast infections Yeast infection Farxiga  . Influenza Vaccines Other (See Comments)    Forxiga  . Other     Other reaction(s): Other (See Comments) Uncoded Allergy. Allergen: Shellfish, Other Reaction: Not Assessed    Flu vaccine given 1998 caused anaphylaxis    Medications Reviewed Today    Reviewed by Vella Raring, RPH-CPP (Pharmacist) on 06/06/20 at  [place] List Status: <None>  Medication Order Taking? Sig Documenting Provider Last Dose Status Informant  acetaminophen (TYLENOL) 500 MG tablet [id number] Yes Take 500 mg by mouth at bedtime as needed. [provider] Taking Active   alendronate (FOSAMAX) 70 MG tablet [id number] Yes Take 1 tablet (70 mg total) by mouth once a week. Take with a full glass of water on an empty stomach. Olin Hauser, DO Taking Active   Ascorbic Acid (VITAMIN C) 1000 MG tablet [id number] Yes Take 1,000 mg by mouth daily as needed.  [provider] Taking Active            Med Note Winfield Cunas, [facility]   Mon Sep 20, 2018  4:05 PM) As needed to prevent illness  atorvastatin (LIPITOR) 40 MG tablet [id number] Yes Take 1 tablet (40 mg total) by mouth daily. Olin Hauser, DO Taking Active   Biotin 1000 MCG tablet [id number] Yes Take 4,000 mcg by mouth every other day.  [provider] Taking Active Self           Med Note Broadus John, Trude Mcburney   Tue Jan 19, 2018 11:31 AM) [name] every other day  blood glucose meter kit and supplies [id number]  Dispense based on patient and insurance preference. Use up to four times daily as directed. (FOR ICD-10 E10.9, E11.9). [name], FNP  Active   Cholecalciferol (VITAMIN D3) 50 MCG (2000 UT) capsule [id number] Yes Take 1,000 Units by mouth daily.  [provider] Taking Active   diclofenac Sodium (VOLTAREN) 1 % GEL [id number] No Apply 2 g topically 3 (three) times daily as needed.  Patient not taking: Reported on 06/06/2020   Olin Hauser, DO Not Taking Active   insulin degludec (TRESIBA FLEXTOUCH) 100 UNIT/ML SOPN FlexTouch Pen [id number] Yes Inject 0.26 mLs (26 Units total) into the skin daily.  Patient taking differently: Inject 20 Units into the skin daily.   [name], NP Taking Active   metFORMIN (GLUCOPHAGE-XR) 500 MG 24 hr tablet [id number] Yes TAKE 3 TABLETS BY MOUTH ONCE DAILY WITH BREAKFAST  Patient  taking differently: TAKE 2 TABLETS BY MOUTH ONCE DAILY WITH BREAKFAST   Karamalegos, Devonne Doughty, DO Taking Active   [facility] ULTRA test strip [id number]  Check blood sugar 1 x daily as directed Olin Hauser, DO  Active   polyethylene glycol powder (GLYCOLAX/MIRALAX) 17 GM/SCOOP powder [id number] No Take 17 g by mouth 2 (two) times daily as needed.  Patient not taking: Reported on 06/06/2020   [name], FNP Not Taking Active   Semaglutide, 1 MG/DOSE, (OZEMPIC, 1 MG/DOSE,) 2 MG/1.5ML SOPN [id number] Yes INJECT 1 MG SUBCUTANEOUSLY ONCE A WEEK Karamalegos, Devonne Doughty, DO Taking Active   Skin Protectants, Misc. [facility] PERISHIELD) 3.8 % OINT [id number] Yes Apply to skin under breast as needed Olin Hauser, DO Taking Active   spironolactone (ALDACTONE) 25 MG tablet [id number] Yes Take 1 tablet (25 mg total) by mouth  daily. Olin Hauser, DO Taking Active   vitamin B-12 (CYANOCOBALAMIN) 1000 MCG tablet [id number] Yes Take 1,000 mcg by mouth daily.  [provider] Taking Active           Patient Active Problem List   Diagnosis Date Noted  . Postmenopausal bleeding 05/16/2020  . Osteoarthritis of knees, bilateral 05/09/2020  . Calcium pyrophosphate deposition disease (CPPD) 05/09/2020  . Cough 01/18/2020  . Fever 01/18/2020  . Acute back pain 10/28/2019  . Immunization due 09/19/2019  . Osteoporosis 08/16/2019  . Screening for osteoporosis 06/16/2019  . Screening for breast cancer 06/16/2019  . Fatigue 05/03/2019  . 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 Allergic rhinitis 02/26/2018  . Hyperlipidemia 02/26/2018  . Diabetes mellitus ([place]) 02/26/2018  . Abdominal pain 02/26/2018  . Cataract cortical, senile 01/18/2018  . Seasonal allergic rhinitis due to pollen 07/24/2016  . Chronic bilateral low back pain without sciatica 11/22/2014  . Chronic pain of both knees 11/22/2014  . Essential hypertension  11/22/2014  . Dyslipidemia associated with type 2 diabetes mellitus ([place]) 11/22/2014  . Type 2 diabetes mellitus with hyperglycemia ([place]) 01/19/2013    Immunization History  Administered Date(s) Administered  . PFIZER(Purple Top)SARS-COV-2 Vaccination 04/21/2019, 05/12/2019, 12/17/2019  . Pneumococcal Conjugate-13 11/18/2017  . Pneumococcal Polysaccharide-23 09/20/2008  . Tdap 04/23/2007, 07/16/2017  . Zoster 11/12/2015    Conditions to be addressed/monitored: HLD and DMII  Care Plan : General Pharmacy (Adult)  Updates made by Vella Raring, RPH-CPP since 06/06/2020 12:00 AM    Problem: Disease Progression     Long-Range Goal: Disease Progression Prevented or Minimized   Start Date: 01/23/2020  Expected End Date: 04/22/2020  This Visit's Progress: On track  Recent Progress: On track  Priority: High  Note:   Current Barriers:  . Unable to independently afford treatment regimen o Patient APPROVED for [name] and Ozempic medication assistance from [employer] for 2022 calendar year  Pharmacist Clinical Goal(s):  Marland Kitchen Over the next 90 days, patient will achieve control of T2DM as evidenced by A1C <7.0%. through collaboration with PharmD and provider.  . Over the next 90 days, patient will maintain control of LDL as evidenced by LDL <70 mg/dL through collaboration with PharmD and provider.  . Over the next 90 days, patient will verbalize ability to afford treatment regimen. through collaboration with PharmD and provider.  Interventions: . Inter-disciplinary care team collaboration (see longitudinal plan of care) . 1:1 collaboration with Olin Hauser, DO regarding development and update of comprehensive plan of care as evidenced by provider attestation and co-signature . Perform chart review o Patient seen for Office Visit with Dr. Nobie Putnam on 3/14 for knee/back pain and T2DM follow up - A1C 7.2% on 3/14 - Provider advised patient to reduce metformin ER  500 mg dose to 2 tablets (1000 mg) daily o Patient seen for Office Visit with Dr. Nobie Putnam on 3/30 for postmenopausal vaginal bleeding - Referral placed to GYN o Patient seen for Office Visit with [facility] on 4/19 - Provider advised patient to return for pelvic ultrasound . Comprehensive medication review performed; medication list updated in electronic medical record  Diabetes: . Control close to goal based on latest A1C result; current treatment: o Tresiba 20 units once daily o Metformin ER 500 mg - 2 tablets daily with breakfast o Ozempic 1 mg once weekly . Current glucose readings: fasting glucose ranging: 108-125 o Denies hypoglycemia o Note patient carries glucose tablets with her .  Reports doing well with having regular and well-balanced meals, while limiting carbohydrate portion sizes o Reports has limited oranges (eating clementines instead) and instead eating lower glycemic index fruits, such as apples, with peanut butter for snacks instead . Reports annual eye exam scheduled for August . Will collaborate with Dr. Parks Ranger to request provider send updated Rx for metformin to reflect current dosing to patient's pharmacy   Lower Back Pain: . Reports continues to have lower back pain, but currently controlled with acetaminophen 650 mg- 1  tablet twice daily as needed and topical pain relief patches from dollar store . 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 . Encourage patient in her plan to try massages as offered through her gym and working on building up core strength working with [employer] . Encourage patient to follow up with PCP office for any new or worsening symptoms  Hyperlipidemia:  Controlled; current treatment: atorvastatin 40 mg daily   Have counseled patient on importance of medication adherence  Have discussed dietary management  Medication Assistance . Patient reports that she is planning to travel in May. She reviews  supply of Ozempic and Tresiba to last until after she returns, but confirms will [facility] if has not received next supply from program by June    Patient Goals/Self-Care Activities . Over the next 90 days, patient will:  o Take medications as prescribed o Check blood glucose daily, document, and provide at future appointments - Keeps log in App on phone o Collaborate with provider on medication access solutions o Attends all scheduled provider appointments o Calls pharmacy for medication refills o Calls provider office for new concerns or questions Follow Up Plan: Telephone follow up appointment with care management team member scheduled for: 08/06/2020 at 8:30 AM     Medication Assistance: APPROVED for [place] and Ozempic medication assistance from [employer] for 2022 calendar year  Patient's preferred pharmacy is:  [facility], [place] - [address] [place] [address] Phone: [phone] Fax: [phone]   Follow Up:  Patient agrees to Care Plan and Follow-up.  Plan: Telephone follow up appointment with care management team member scheduled for:  08/06/2020 at 8:30 AM  [name], PharmD, [place], [place] [phone]

## 2020-06-07 ENCOUNTER — Telehealth: Payer: Self-pay | Admitting: General Practice

## 2020-06-07 ENCOUNTER — Telehealth: Payer: Self-pay

## 2020-06-07 LAB — NUSWAB VAGINITIS PLUS (VG+)
Candida albicans, NAA: NEGATIVE
Candida glabrata, NAA: NEGATIVE
Chlamydia trachomatis, NAA: NEGATIVE
Neisseria gonorrhoeae, NAA: NEGATIVE
Trich vag by NAA: NEGATIVE

## 2020-06-07 NOTE — Telephone Encounter (Signed)
  Chronic Care Management   Outreach Note  06/07/2020 Name: Summer Young MRN: [id number] DOB: Jan 27, 1951  Referred by: [name], FNP Reason for referral : Appointment (RNCM: Follow up for Chronic Disease Management and Care Coordination needs )   An unsuccessful telephone outreach was attempted today. The patient was referred to the case management team for assistance with care management and care coordination.   Follow Up Plan: A HIPAA compliant phone message was left for the patient providing contact information and requesting a return call.   Noreene Larsson RN, MSN, [place] Mobile: [phone]

## 2020-06-11 NOTE — Telephone Encounter (Signed)
Patient has been rescheduled.

## 2020-06-13 ENCOUNTER — Telehealth: Payer: Self-pay

## 2020-06-19 ENCOUNTER — Ambulatory Visit (INDEPENDENT_AMBULATORY_CARE_PROVIDER_SITE_OTHER): Payer: PPO | Admitting: Licensed Clinical Social Worker

## 2020-06-19 DIAGNOSIS — E1165 Type 2 diabetes mellitus with hyperglycemia: Secondary | ICD-10-CM

## 2020-06-19 DIAGNOSIS — G8929 Other chronic pain: Secondary | ICD-10-CM

## 2020-06-19 DIAGNOSIS — Z7189 Other specified counseling: Secondary | ICD-10-CM

## 2020-06-19 DIAGNOSIS — F419 Anxiety disorder, unspecified: Secondary | ICD-10-CM

## 2020-06-19 DIAGNOSIS — M545 Low back pain, unspecified: Secondary | ICD-10-CM

## 2020-06-19 NOTE — Patient Instructions (Signed)
Visit Information  Goals Addressed            This Visit's Progress   . Track and Manage My Symptoms-Depression       Timeframe:  Long-Range Goal Priority:  Medium  Start Date:    03/28/20                      Expected End Date:   06/25/20               Follow Up Date -10/02/20   Patient Self Care Activities:  . Attends all scheduled provider appointments . Continue utilizing healthy coping skills        Patient verbalizes understanding of instructions provided today.  Telephone follow up appointment with care management team member scheduled for: 10/02/20  Christa See, MSW, [email or web address] Phone [phone] 4:45 PM

## 2020-06-19 NOTE — Chronic Care Management (AMB) (Signed)
Chronic Care Management    Clinical Social Work Note  06/19/2020 Name: Summer Young MRN: [id number] DOB: [birth year]  Summer Young is a 70 y.o. year old female who is a primary care patient of [name], Lupita Raider, FNP. The CCM team was consulted to assist the patient with chronic disease management and/or care coordination needs related to: Mental Health Counseling and Resources.   Engaged with patient by telephone for follow up visit in response to provider referral for social work chronic care management and care coordination services.   Consent to Services:  The patient was given information about Chronic Care Management services, agreed to services, and gave verbal consent prior to initiation of services.  Please see initial visit note for detailed documentation.   Patient agreed to services and consent obtained.   Assessment: Patient is engaged in conversation, continues to maintain positive progress with care plan goals. Pt reports a decrease in depression and anxiety symptoms. She was successful in identifying healthy coping skills. See Care Plan below for interventions and patient self-care actives. Recent life changes /stressors: Management of back pain Recommendation: Patient may benefit from, and is in agreement to continue utilization of healthy coping skills discussed.  Follow up Plan: Patient would like continued follow-up.  CCM LCSW will follow up with patient on 10/02/20. Patient will call office if needed prior to next encounter.    SDOH (Social Determinants of Health) assessments and interventions performed:    Advanced Directives Status: Not addressed in this encounter.  CCM Care Plan  Allergies  Allergen Reactions  . Aspirin Hives    Other reaction(s): Other (See Comments) Other Reaction: Not Assessed   . Insulin Detemir Hives and Rash    Errythema, edema, heat at site of injection did not improve after 2 weeks of use.    . Shellfish Allergy Anaphylaxis  .  Dapagliflozin Other (See Comments)    Yeast infections Yeast infection Farxiga  . Influenza Vaccines Other (See Comments)    Forxiga  . Other     Other reaction(s): Other (See Comments) Uncoded Allergy. Allergen: Shellfish, Other Reaction: Not Assessed    Flu vaccine given 1998 caused anaphylaxis    Outpatient Encounter Medications as of 06/19/2020  Medication Sig Note  . acetaminophen (TYLENOL) 500 MG tablet Take 650 mg by mouth 2 (two) times daily as needed.   Marland Kitchen alendronate (FOSAMAX) 70 MG tablet Take 1 tablet (70 mg total) by mouth once a week. Take with a full glass of water on an empty stomach.   . Ascorbic Acid (VITAMIN C) 1000 MG tablet Take 1,000 mg by mouth daily as needed.  09/20/2018: As needed to prevent illness  . atorvastatin (LIPITOR) 40 MG tablet Take 1 tablet (40 mg total) by mouth daily.   . Biotin 1000 MCG tablet Take 4,000 mcg by mouth every other day.  01/19/2018: Takes every other day  . blood glucose meter kit and supplies Dispense based on patient and insurance preference. Use up to four times daily as directed. (FOR ICD-10 E10.9, E11.9).   Marland Kitchen Cholecalciferol (VITAMIN D3) 50 MCG (2000 UT) capsule Take 1,000 Units by mouth daily.    . diclofenac Sodium (VOLTAREN) 1 % GEL Apply 2 g topically 3 (three) times daily as needed. (Patient not taking: Reported on 06/06/2020)   . insulin degludec (TRESIBA FLEXTOUCH) 100 UNIT/ML SOPN FlexTouch Pen Inject 0.26 mLs (26 Units total) into the skin daily. (Patient taking differently: Inject 20 Units into the skin daily.)   . metFORMIN (GLUCOPHAGE-XR)  500 MG 24 hr tablet TAKE 2 TABLETS BY MOUTH ONCE DAILY WITH BREAKFAST   . ONETOUCH ULTRA test strip Check blood sugar 1 x daily as directed   . polyethylene glycol powder (GLYCOLAX/MIRALAX) 17 GM/SCOOP powder Take 17 g by mouth 2 (two) times daily as needed. (Patient not taking: Reported on 06/06/2020)   . Semaglutide, 1 MG/DOSE, (OZEMPIC, 1 MG/DOSE,) 2 MG/1.5ML SOPN INJECT 1 MG SUBCUTANEOUSLY  ONCE A WEEK   . Skin Protectants, Misc. (AMERIDERM PERISHIELD) 3.8 % OINT Apply to skin under breast as needed   . spironolactone (ALDACTONE) 25 MG tablet Take 1 tablet (25 mg total) by mouth daily.   . vitamin B-12 (CYANOCOBALAMIN) 1000 MCG tablet Take 1,000 mcg by mouth daily.     No facility-administered encounter medications on file as of 06/19/2020.    Patient Active Problem List   Diagnosis Date Noted  . Postmenopausal bleeding 05/16/2020  . Osteoarthritis of knees, bilateral 05/09/2020  . Calcium pyrophosphate deposition disease (CPPD) 05/09/2020  . Cough 01/18/2020  . Fever 01/18/2020  . Acute back pain 10/28/2019  . Immunization due 09/19/2019  . Osteoporosis 08/16/2019  . Screening for osteoporosis 06/16/2019  . Screening for breast cancer 06/16/2019  . Fatigue 05/03/2019  . 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 Allergic rhinitis 02/26/2018  . Hyperlipidemia 02/26/2018  . Diabetes mellitus ([place]) 02/26/2018  . Abdominal pain 02/26/2018  . Cataract cortical, senile 01/18/2018  . Seasonal allergic rhinitis due to pollen 07/24/2016  . Chronic bilateral low back pain without sciatica 11/22/2014  . Chronic pain of both knees 11/22/2014  . Essential hypertension 11/22/2014  . Dyslipidemia associated with type 2 diabetes mellitus ([employer]) 11/22/2014  . Type 2 diabetes mellitus with hyperglycemia ([place]) 01/19/2013    Conditions to be addressed/monitored: Anxiety and Depression; Mental Health Concerns   Care Plan : General Social Work (Adult)  Updates made by Christa See D, LCSW since 06/19/2020 12:00 AM    Problem: Depression Identification (Depression)     Long-Range Goal: Depressive Symptoms Identified   Start Date: 01/24/2020  This Visit's Progress: On track  Recent Progress: On track  Priority: Medium  Note:   Timeframe:  Long-Range Goal Priority:  Medium  Start Date:    03/28/20                      Expected End Date:    10/17/20               Follow Up Date 10/02/20   Current Barriers:  . Acute Mental Health needs related to anxiety/stress . Mental Health Concerns  . Suicidal Ideation/Homicidal Ideation: No  Clinical Social Work Goal(s):  Marland Kitchen Over the next 120 days, patient will work with SW bi-monthly by telephone or in person to reduce or manage symptoms related to anxiety . Over the next 120 days, patient will work with SW to address concerns related to gaining mental health support and develop better coping skills to manage symptoms  Interventions: . Patient interviewed and appropriate assessments performed. Pt reports that she is managing symptoms of depression and anxiety well. Insomnia symptoms have resolved . Pt reports that she is currently managing back pain from arthritis with Tylenol. Feels it has been more effective then prescribed medicine  . Patient was successful in identifying healthy coping skills. She has plans on visiting family in [place] at the end of this month. She talks with sisters routinely and receives strong support from spouse .  Patient is not open to therapy and/or medication management . CCM LCSW discussed plans with patient for ongoing care management follow up and provided patient with direct contact information for care management team . CCM LCSW advised patient to contact CCM program for any urgent case management needs  Patient Self Care Activities:  . Attends all scheduled provider appointments . Continue utilizing healthy coping skills         Christa See, MSW, [facility] Care Management [email or web address] Phone [phone] 4:42 PM

## 2020-06-20 ENCOUNTER — Telehealth: Payer: Self-pay

## 2020-06-20 NOTE — Telephone Encounter (Signed)
I called the patient and notified her that her medications are here and available for pick up.

## 2020-06-21 ENCOUNTER — Telehealth: Payer: PPO | Admitting: General Practice

## 2020-06-21 ENCOUNTER — Telehealth: Payer: Self-pay | Admitting: General Practice

## 2020-06-21 ENCOUNTER — Ambulatory Visit: Payer: Self-pay | Admitting: General Practice

## 2020-06-21 DIAGNOSIS — I10 Essential (primary) hypertension: Secondary | ICD-10-CM

## 2020-06-21 DIAGNOSIS — E782 Mixed hyperlipidemia: Secondary | ICD-10-CM | POA: Diagnosis not present

## 2020-06-21 DIAGNOSIS — E1165 Type 2 diabetes mellitus with hyperglycemia: Secondary | ICD-10-CM | POA: Diagnosis not present

## 2020-06-21 DIAGNOSIS — G8929 Other chronic pain: Secondary | ICD-10-CM

## 2020-06-21 DIAGNOSIS — M545 Low back pain, unspecified: Secondary | ICD-10-CM

## 2020-06-21 NOTE — Telephone Encounter (Signed)
  Chronic Care Management   Note  06/21/2020 Name: Summer Young MRN: [id number] DOB: May 18, 1950  Incoming call from the patient. Call completed. See new encounter for visit summary.    Follow up plan: Telephone follow up appointment with care management team member scheduled for: 09-06-2020 at [place] am  Noreene Larsson RN, MSN, [place] Mobile: [phone]

## 2020-06-21 NOTE — Chronic Care Management (AMB) (Signed)
Chronic Care Management   CCM RN Visit Note  06/21/2020 Name: Summer Young MRN: [id number] DOB: 15-Oct-1950  Subjective: Summer Young is a 70 y.o. year old female who is a primary care patient of [name], Lupita Raider, FNP. The care management team was consulted for assistance with disease management and care coordination needs.    Engaged with patient by telephone for follow up visit in response to provider referral for case management and/or care coordination services.   Consent to Services:  The patient was given information about Chronic Care Management services, agreed to services, and gave verbal consent prior to initiation of services.  Please see initial visit note for detailed documentation.   Patient agreed to services and verbal consent obtained.   Assessment: Review of patient past medical history, allergies, medications, health status, including review of consultants reports, laboratory and other test data, was performed as part of comprehensive evaluation and provision of chronic care management services.   SDOH (Social Determinants of Health) assessments and interventions performed:    CCM Care Plan  Allergies  Allergen Reactions  . Aspirin Hives    Other reaction(s): Other (See Comments) Other Reaction: Not Assessed   . Insulin Detemir Hives and Rash    Errythema, edema, heat at site of injection did not improve after 2 weeks of use.    . Shellfish Allergy Anaphylaxis  . Dapagliflozin Other (See Comments)    Yeast infections Yeast infection Farxiga  . Influenza Vaccines Other (See Comments)    Forxiga  . Other     Other reaction(s): Other (See Comments) Uncoded Allergy. Allergen: Shellfish, Other Reaction: Not Assessed    Flu vaccine given 1998 caused anaphylaxis    Outpatient Encounter Medications as of 06/21/2020  Medication Sig Note  . acetaminophen (TYLENOL) 500 MG tablet Take 650 mg by mouth 2 (two) times daily as needed.   Marland Kitchen alendronate (FOSAMAX) 70 MG tablet  Take 1 tablet (70 mg total) by mouth once a week. Take with a full glass of water on an empty stomach.   . Ascorbic Acid (VITAMIN C) 1000 MG tablet Take 1,000 mg by mouth daily as needed.  09/20/2018: As needed to prevent illness  . atorvastatin (LIPITOR) 40 MG tablet Take 1 tablet (40 mg total) by mouth daily.   . Biotin 1000 MCG tablet Take 4,000 mcg by mouth every other day.  01/19/2018: Takes every other day  . blood glucose meter kit and supplies Dispense based on patient and insurance preference. Use up to four times daily as directed. (FOR ICD-10 E10.9, E11.9).   Marland Kitchen Cholecalciferol (VITAMIN D3) 50 MCG (2000 UT) capsule Take 1,000 Units by mouth daily.    . diclofenac Sodium (VOLTAREN) 1 % GEL Apply 2 g topically 3 (three) times daily as needed. (Patient not taking: Reported on 06/06/2020)   . insulin degludec (TRESIBA FLEXTOUCH) 100 UNIT/ML SOPN FlexTouch Pen Inject 0.26 mLs (26 Units total) into the skin daily. (Patient taking differently: Inject 20 Units into the skin daily.)   . metFORMIN (GLUCOPHAGE-XR) 500 MG 24 hr tablet TAKE 2 TABLETS BY MOUTH ONCE DAILY WITH BREAKFAST   . ONETOUCH ULTRA test strip Check blood sugar 1 x daily as directed   . polyethylene glycol powder (GLYCOLAX/MIRALAX) 17 GM/SCOOP powder Take 17 g by mouth 2 (two) times daily as needed. (Patient not taking: Reported on 06/06/2020)   . Semaglutide, 1 MG/DOSE, (OZEMPIC, 1 MG/DOSE,) 2 MG/1.5ML SOPN INJECT 1 MG SUBCUTANEOUSLY ONCE A WEEK   . Skin Protectants, Misc. (  AMERIDERM PERISHIELD) 3.8 % OINT Apply to skin under breast as needed   . spironolactone (ALDACTONE) 25 MG tablet Take 1 tablet (25 mg total) by mouth daily.   . vitamin B-12 (CYANOCOBALAMIN) 1000 MCG tablet Take 1,000 mcg by mouth daily.     No facility-administered encounter medications on file as of 06/21/2020.    Patient Active Problem List   Diagnosis Date Noted  . Postmenopausal bleeding 05/16/2020  . Osteoarthritis of knees, bilateral 05/09/2020  .  Calcium pyrophosphate deposition disease (CPPD) 05/09/2020  . Cough 01/18/2020  . Fever 01/18/2020  . Acute back pain 10/28/2019  . Immunization due 09/19/2019  . Osteoporosis 08/16/2019  . Screening for osteoporosis 06/16/2019  . Screening for breast cancer 06/16/2019  . Fatigue 05/03/2019  . 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 Allergic rhinitis 02/26/2018  . Hyperlipidemia 02/26/2018  . Diabetes mellitus ([place]) 02/26/2018  . Abdominal pain 02/26/2018  . Cataract cortical, senile 01/18/2018  . Seasonal allergic rhinitis due to pollen 07/24/2016  . Chronic bilateral low back pain without sciatica 11/22/2014  . Chronic pain of both knees 11/22/2014  . Essential hypertension 11/22/2014  . Dyslipidemia associated with type 2 diabetes mellitus ([place]) 11/22/2014  . Type 2 diabetes mellitus with hyperglycemia ([place]) 01/19/2013    Conditions to be addressed/monitored:HTN, HLD, DMII and Chronic Back Pain   Care Plan : RNCM: Diabetes Type 2 (Adult)  Updates made by [name] since 06/21/2020 12:00 AM    Problem: RNCM: Management of DM   Priority: High    Long-Range Goal: RNCM: Management of DM   Priority: Medium  Note:   Objective:  Lab Results  Component Value Date   HGBA1C 7.2 (A) 04/30/2020    Lab Results  Component Value Date   CREATININE 0.82 04/12/2019   CREATININE 0.79 06/10/2018   CREATININE 0.87 03/03/2018 .   Marland Kitchen No results found for: EGFR Current Barriers:  Marland Kitchen Knowledge Deficits related to basic Diabetes pathophysiology and self care/management . Knowledge Deficits related to medications used for management of diabetes . Knowledge Deficits related to self administration of injectable diabetes medications (Ozempic) . Limited Social Support . Unable to independently manage DM as evidence of hemoglobin A1C or 7.4 on 01-18-2020, new on 04-30-2020 was 7.2 . Lacks social connections . Does not contact provider office for  questions/concerns Case Manager Clinical Goal(s):  Marland Kitchen Over the next 120 days, patient will demonstrate improved adherence to prescribed treatment plan for diabetes self care/management as evidenced by:  . daily monitoring and recording of CBG  . adherence to ADA/ carb modified diet . exercise 4/5 days/week . adherence to prescribed medication regimen Interventions:  . Provided education to patient about basic DM disease process . Reviewed medications with patient and discussed importance of medication adherence. 06-21-2020- the patient is taking medications as ordered. No issues at this time.  . Discussed plans with patient for ongoing care management follow up and provided patient with direct contact information for care management team . Provided patient with written educational materials related to hypo and hyperglycemia and importance of correct treatment. 06-21-2020: The patient denies any issue with lows at this time. Range has been 85-128. The patient is hopeful her new hemoglobin A1C will be less than 7.0 . Advised patient, providing education and rationale, to check cbg BID and record, calling pcp for findings outside established parameters.  The patient states 5 days ago her blood sugars were 175-185 but they have come back down to  96 range. She is mindful of blood sugar readings.  Wants to stay on track. 06-21-2020: The patient states that he blood sugar was 85 this am. The highest it has been is 128 recently. She is monitoring her dietary intake and is working on getting back to the gym. She is going to her step-daughters wedding in [place] this weekend, reminded the patient to monitor her dietary intake. She states that she does not even like wedding cake.  . Review of patient status, including review of consultants reports, relevant laboratory and other test results, and medications completed. . - barriers to adherence to treatment plan identified . - blood glucose monitoring encouraged-  fasting <130 and post prandial <180. 06-21-2020: Reviewed . - blood glucose readings reviewed-  06-21-2020: range over the last 2 weeks have been 85-128 . - mutual A1C goal set or reviewed- goal of 7.0 or less- is hopeful at 04-30-2020 appointment for <7.0. 06-21-2020: Her hemoglobin A1C on 3-14- was 7.2. The patient states she is working harder to get it down even more.  . - resources required to improve adherence to care identified. 06-21-2020: Continues to work with CCM team to optimize health and well being.  . - self-awareness of signs/symptoms of hypo or hyperglycemia encouraged. 06-21-2020: Is aware of the sx and sx of changes and knows how to correct. Denies any highs or lows . - use of blood glucose monitoring log promoted Patient Goals/Self-Care Activities . Over the next 120 days, patient will:  - UNABLE to independently manage DM Adheres to prescribed ADA/carb modified Follow Up Plan: Face to Face appointment with care management team member scheduled for:    09-06-2020 at 0945 am    Care Plan : RNCM: Chronic Back  Pain (Adult)  Updates made by [name] since 06/21/2020 12:00 AM    Problem: RNCM: Pain Management Plan (Chronic Pain)   Priority: High    Long-Range Goal: RNCM: Pain Management Plan Developed for Chroni Back pain   Priority: High  Note:   Current Barriers:  Marland Kitchen Knowledge Deficits related to management of Chronic back pain and osteoarthritis . Care Coordination needs related to effective management of pain in a patient with chronic back pain and discomfort . Chronic Disease Management support and education needs related to Chronic pain and discomfort  . Lacks caregiver support.  . Unable to independently manage pain and discomfort of chronic back pain . Does not adhere to provider recommendations re: years ago the patient admits she did not complete PT on her back from a work injury and feels this significantly impacts her ability to have relief now.  . Does not contact  provider office for questions/concerns  Nurse Case Manager Clinical Goal(s):  Marland Kitchen Over the next 120 days, patient will verbalize understanding of plan for management of Chronic pain and discomfort  . Over the next 120 days, patient will work with pcp and CCM to address needs related to ongoing chronic pain and discomfort . Over the next 120 days, patient will demonstrate a decrease in pain exacerbations as evidenced by compliance with the plan of care and exploring options that will benefit the patient and help with pain relief  . Over the next 120 days, patient will demonstrate improved adherence to prescribed treatment plan for chronic pain and discomfort  as evidenced bydecreased pain level  . Over the next 120 days, patient will demonstrate improved health management independence as evidenced byable to perform ADLS/IADLS  Interventions:  . 1:1 collaboration with  [place], Lupita Raider, FNP regarding development and update of comprehensive plan of care as evidenced by provider attestation and co-signature . Inter-disciplinary care team collaboration (see longitudinal plan of care) . Evaluation of current treatment plan related to chronic pain and discomfort and patient's adherence to plan as established by provider. 04-12-2020: The patient is not working as much now and this is helping with the back pain and discomfort. 06-21-2020: The patient states she continues to have pain but she is managing with the use of Tylenol arthritis. She is taking it every other day. She states that she was taking Meloxicam but it made her dizzy and nauseated. She says she is sticking with the Tylenol and that is working well for her. She is optimistic and has decided not to let it stop her with what she wants to do.  . Advised patient to call the provider for changes in level or intensity of pain . Provided education to patient re: alternative pain relief methods and pacing activities. 06-21-2020: Review of activities and the  patient states going to the gym was helpful but she has not been in a couple of months. Education provided on the benefits of working out and strengthening back muscles. The patient will work on making it a priority to go back to the gym.  . Reviewed medications with patient and discussed compliance. 06-21-2020: The patient is compliant with medications  . Discussed plans with patient for ongoing care management follow up and provided patient with direct contact information for care management team . Provided patient with pain relief educational materials related to pain and discomfort of chronic back pain.  . - mutually acceptable comfort goal set . - pain assessed . - pain management plan developed . - pain treatment goals reviewed . - patient response to treatment assessed . - careful application of heat or ice encouraged . - complementary therapy use encouraged . - deep breathing, relaxation and mindfulness use promoted . - effectiveness of pharmacologic therapy monitored . - motivation and barriers to change assessed and addressed . - mutually acceptable comfort goal set . - participation in physical therapy encouraged . - anxiety related to change in medications acknowledged . - balance, gait and fall risk reviewed . - quality of sleep assessed . - safety plan developed . - sleep hygiene techniques encouraged- patient endorses sleeping about 6 hours a night and feeling rested when waking  . - strategies to improve or maintain safety promoted  Patient Goals/Self-Care Activities Over the next 120 days, patient will:  - Patient will self administer medications as prescribed Patient will attend all scheduled provider appointments Patient will call provider office for new concerns or questions Patient will work with BSW to address care coordination needs and will continue to work with the clinical team to address health care and disease management related needs.    Follow Up Plan:  Telephone follow up appointment with care management team member scheduled for: 09-06-2020 at 0945  am      Care Plan : RNCM: Management of Hypertension and HLD (Adult)  Updates made by [name] since 06/21/2020 12:00 AM    Problem: RNCM: Hypertension and HLD Management  (Hypertension)   Priority: Medium  Note:   The patient has HTN and HLD   Long-Range Goal: RNCM: Management of HTN and HLD   Expected End Date: 07/12/2021  Priority: Medium  Note:   Objective:  . Last practice recorded BP readings:  . BP Readings from Last 3  Encounters: .  06/05/20 . 122/70 .  05/16/20 . 131/75 .  04/30/20 . 136/79 .   Marland Kitchen Lab Results .  Component . Value . Date .   Marland Kitchen CHOL . 104 . 04/12/2019 .   Marland Kitchen CHOL . 111 . 06/10/2018 .   Marland Kitchen CHOL . 100 . 03/03/2018 .   Marland Kitchen Lab Results .  Component . Value . Date .   Marland Kitchen HDL . 45 (L) . 04/12/2019 .   Marland Kitchen HDL . 39 (L) . 06/10/2018 .   Marland Kitchen HDL . 46 (L) . 03/03/2018 .   Marland Kitchen Lab Results .  Component . Value . Date .   Marland Kitchen [place] . 45 . 04/12/2019 .   Marland Kitchen [place] . 53 . 06/10/2018 .   Marland Kitchen [place] . 39 . 03/03/2018 .   Marland Kitchen Lab Results .  Component . Value . Date .   Marland Kitchen TRIG . 63 . 04/12/2019 .   Marland Kitchen TRIG . 102 . 06/10/2018 .   Marland Kitchen TRIG . 73 . 03/03/2018 .   Marland Kitchen Lab Results .  Component . Value . Date .   Marland Kitchen CHOLHDL . 2.3 . 04/12/2019 .   Marland Kitchen CHOLHDL . 2.8 . 06/10/2018 .   Marland Kitchen CHOLHDL . 2.2 . 03/03/2018 .   Marland Kitchen No results found for: LDLDIRECT   .  Takes Lipitor 40 mg QD  . Most recent eGFR/CrCl: No results found for: EGFR  No components found for: CRCL Current Barriers:  Marland Kitchen Knowledge Deficits related to basic understanding of hypertension and HLD pathophysiology and self care management . Knowledge Deficits related to understanding of medications prescribed for management of hypertension and HLD . Unable to independently manage chronic conditions of HTN and HLD . Does not contact provider office for questions/concerns Case Manager Clinical Goal(s):  Marland Kitchen Over the next 120 days,  patient will verbalize understanding of plan for hypertension management . Over the next 120 days, patient will attend all scheduled medical appointments: saw pcp on 05-16-2020. Knows to call for changes in condition . Over the next 120 days, patient will demonstrate improved adherence to prescribed treatment plan for hypertension as evidenced by taking all medications as prescribed, monitoring and recording blood pressure as directed, adhering to low sodium/DASH diet . Over the next 120 days, patient will demonstrate improved health management independence as evidenced by checking blood pressure as directed and notifying PCP if SBP>160 or DBP > 90, taking all medications as prescribe, and adhering to a low sodium diet as discussed. Interventions:  . Evaluation of current treatment plan related to hypertension self management and patient's adherence to plan as established by provider. 06-21-2020: The patient is compliant with heart healthy diet, is working on going back to the gym to help with improving chronic conditions. Review of heart healthy/ADA restrictions as the patient is going out of town this weekend for a wedding.  . Provided education to patient re: stroke prevention, s/s of heart attack and stroke, DASH diet, complications of uncontrolled blood pressure . Reviewed medications with patient and discussed importance of compliance. 06-21-2020: The patient is compliant with medications regimen.  . Discussed plans with patient for ongoing care management follow up and provided patient with direct contact information for care management team . Advised patient, providing education and rationale, to monitor blood pressure daily and record, calling PCP for findings outside established parameters.  . Review of recent sinus/cold congestion. Took COVID test and the test was negative. 06-21-2020: Denies any issues related to  cold and sinus issues. States she is still using her mask.  . - blood pressure trends  reviewed . - depression screen reviewed . - home or ambulatory blood pressure monitoring encouraged . - healthy diet promoted . - healthy family lifestyle promoted . - medication side effects managed . - pain assessed and managed . - patient response to treatment assessed . - quality of sleep assessed . - reduction of dietary sodium encouraged . - reduction in sedentary activities encouraged . - response to pharmacologic therapy monitored . - sleep hygiene techniques encouraged Patient Goals/Self-Care Activities . Over the next 120 days, patient will:  - Calls provider office for new concerns, questions, or BP outside discussed parameters Checks BP and records as discussed Follows a low sodium diet/DASH diet Follow Up Plan: Telephone follow up appointment with care management team member scheduled for:  09-06-2020 at 0945 am     Plan:Telephone follow up appointment with care management team member scheduled for:  09-06-2020 at [place] am  [place], MSN, [place] Mobile: [phone]

## 2020-06-21 NOTE — Patient Instructions (Signed)
Visit Information  PATIENT GOALS: Patient Care Plan: General Pharmacy (Adult)    Problem Identified: Disease Progression     Long-Range Goal: Disease Progression Prevented or Minimized   Start Date: 01/23/2020  Expected End Date: 04/22/2020  This Visit's Progress: On track  Recent Progress: On track  Priority: High  Note:   Current Barriers:  . Unable to independently afford treatment regimen o Patient APPROVED for [name] and Ozempic medication assistance from [employer] for 2022 calendar year  Pharmacist Clinical Goal(s):  Marland Kitchen Over the next 90 days, patient will achieve control of T2DM as evidenced by A1C <7.0%. through collaboration with PharmD and provider.  . Over the next 90 days, patient will maintain control of LDL as evidenced by LDL <70 mg/dL through collaboration with PharmD and provider.  . Over the next 90 days, patient will verbalize ability to afford treatment regimen. through collaboration with PharmD and provider.  Interventions: . Inter-disciplinary care team collaboration (see longitudinal plan of care) . 1:1 collaboration with Olin Hauser, DO regarding development and update of comprehensive plan of care as evidenced by provider attestation and co-signature . Perform chart review o Patient seen for Office Visit with Dr. Nobie Putnam on 3/14 for knee/back pain and T2DM follow up - A1C 7.2% on 3/14 - Provider advised patient to reduce metformin ER 500 mg dose to 2 tablets (1000 mg) daily o Patient seen for Office Visit with Dr. Nobie Putnam on 3/30 for postmenopausal vaginal bleeding - Referral placed to GYN o Patient seen for Office Visit with [facility] on 4/19 - Provider advised patient to return for pelvic ultrasound . Comprehensive medication review performed; medication list updated in electronic medical record  Diabetes: . Control close to goal based on latest A1C result; current treatment: o Tresiba 20 units once  daily o Metformin ER 500 mg - 2 tablets daily with breakfast o Ozempic 1 mg once weekly . Current glucose readings: fasting glucose ranging: 108-125 o Denies hypoglycemia o Note patient carries glucose tablets with her . Reports doing well with having regular and well-balanced meals, while limiting carbohydrate portion sizes o Reports has limited oranges (eating clementines instead) and instead eating lower glycemic index fruits, such as apples, with peanut butter for snacks instead . Will collaborate with Dr. Parks Ranger to request provider send updated Rx for metformin to reflect current dosing to patient's pharmacy   Lower Back Pain: . Reports continues to have lower back pain, but currently controlled with acetaminophen 650 mg tablet twice daily as needed and topical pain relief patches from dollar store . 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 . Encourage patient in her plan to try massages as offered through her gym and working on building up core strength working with gym trainer  Hyperlipidemia:  Controlled; current treatment: atorvastatin 40 mg daily   Have counseled patient on importance of medication adherence  Have discussed dietary management  Medication Assistance . Patient reports that she is planning to travel in May. She reviews supply of Ozempic and Tresiba to last until after she returns, but confirms will [facility] if has not received next supply from program by June    Patient Goals/Self-Care Activities . Over the next 90 days, patient will:  o Take medications as prescribed o Check blood glucose daily, document, and provide at future appointments - Keeps log in App on phone o Collaborate with provider on medication access solutions o Attends all scheduled provider appointments o Calls pharmacy for medication  refills o Calls provider office for new concerns or questions Follow Up Plan: Telephone follow up appointment with care  management team member scheduled for: 08/06/2020 at 8:30 AM   Long-Range Goal: Patient Assistance Completed 04/11/2020  Start Date: 03/09/2020  Expected End Date: 05/08/2020  Recent Progress: On track  Priority: High  Note:   Work with provider and provide documents for re-enrollment in medication assistance program for [place] and Ozempic from [employer] for  for 2022 calendar year   Patient Care Plan: General Social Work (Adult)    Problem Identified: Depression Identification (Depression)     Long-Range Goal: Depressive Symptoms Identified   Start Date: 01/24/2020  This Visit's Progress: On track  Recent Progress: On track  Priority: Medium  Note:   Timeframe:  Long-Range Goal Priority:  Medium  Start Date:    03/28/20                      Expected End Date:   10/17/20               Follow Up Date 10/02/20   Current Barriers:  . Acute Mental Health needs related to anxiety/stress . Mental Health Concerns  . Suicidal Ideation/Homicidal Ideation: No  Clinical Social Work Goal(s):  Marland Kitchen Over the next 120 days, patient will work with SW bi-monthly by telephone or in person to reduce or manage symptoms related to anxiety . Over the next 120 days, patient will work with SW to address concerns related to gaining mental health support and develop better coping skills to manage symptoms  Interventions: . Patient interviewed and appropriate assessments performed. Pt reports that she is managing symptoms of depression and anxiety well. Insomnia symptoms have resolved . Pt reports that she is currently managing back pain from arthritis with Tylenol. Feels it has been more effective then prescribed medicine  . Patient was successful in identifying healthy coping skills. She has plans on visiting family in [place] at the end of this month. She talks with sisters routinely and receives strong support from spouse . Patient is not open to therapy and/or medication management . CCM LCSW discussed  plans with patient for ongoing care management follow up and provided patient with direct contact information for care management team . CCM LCSW advised patient to contact CCM program for any urgent case management needs  Patient Self Care Activities:  . Attends all scheduled provider appointments . Continue utilizing healthy coping skills       Patient Care Plan: RNCM: Diabetes Type 2 (Adult)    Problem Identified: RNCM: Management of DM   Priority: High    Long-Range Goal: RNCM: Management of DM   Priority: Medium  Note:   Objective:  Lab Results  Component Value Date   HGBA1C 7.2 (A) 04/30/2020    Lab Results  Component Value Date   CREATININE 0.82 04/12/2019   CREATININE 0.79 06/10/2018   CREATININE 0.87 03/03/2018 .   Marland Kitchen No results found for: EGFR Current Barriers:  Marland Kitchen Knowledge Deficits related to basic Diabetes pathophysiology and self care/management . Knowledge Deficits related to medications used for management of diabetes . Knowledge Deficits related to self administration of injectable diabetes medications (Ozempic) . Limited Social Support . Unable to independently manage DM as evidence of hemoglobin A1C or 7.4 on 01-18-2020, new on 04-30-2020 was 7.2 . Lacks social connections . Does not contact provider office for questions/concerns Case Manager Clinical Goal(s):  Marland Kitchen Over the next 120 days, patient  will demonstrate improved adherence to prescribed treatment plan for diabetes self care/management as evidenced by:  . daily monitoring and recording of CBG  . adherence to ADA/ carb modified diet . exercise 4/5 days/week . adherence to prescribed medication regimen Interventions:  . Provided education to patient about basic DM disease process . Reviewed medications with patient and discussed importance of medication adherence. 06-21-2020- the patient is taking medications as ordered. No issues at this time.  . Discussed plans with patient for ongoing care  management follow up and provided patient with direct contact information for care management team . Provided patient with written educational materials related to hypo and hyperglycemia and importance of correct treatment. 06-21-2020: The patient denies any issue with lows at this time. Range has been 85-128. The patient is hopeful her new hemoglobin A1C will be less than 7.0 . Advised patient, providing education and rationale, to check cbg BID and record, calling pcp for findings outside established parameters.  The patient states 5 days ago her blood sugars were 175-185 but they have come back down to 96 range. She is mindful of blood sugar readings.  Wants to stay on track. 06-21-2020: The patient states that he blood sugar was 85 this am. The highest it has been is 128 recently. She is monitoring her dietary intake and is working on getting back to the gym. She is going to her step-daughters wedding in [place] this weekend, reminded the patient to monitor her dietary intake. She states that she does not even like wedding cake.  . Review of patient status, including review of consultants reports, relevant laboratory and other test results, and medications completed. . - barriers to adherence to treatment plan identified . - blood glucose monitoring encouraged- fasting <130 and post prandial <180. 06-21-2020: Reviewed . - blood glucose readings reviewed-  06-21-2020: range over the last 2 weeks have been 85-128 . - mutual A1C goal set or reviewed- goal of 7.0 or less- is hopeful at 04-30-2020 appointment for <7.0. 06-21-2020: Her hemoglobin A1C on 3-14- was 7.2. The patient states she is working harder to get it down even more.  . - resources required to improve adherence to care identified. 06-21-2020: Continues to work with CCM team to optimize health and well being.  . - self-awareness of signs/symptoms of hypo or hyperglycemia encouraged. 06-21-2020: Is aware of the sx and sx of changes and knows how to  correct. Denies any highs or lows . - use of blood glucose monitoring log promoted Patient Goals/Self-Care Activities . Over the next 120 days, patient will:  - UNABLE to independently manage DM Adheres to prescribed ADA/carb modified Follow Up Plan: Face to Face appointment with care management team member scheduled for:    09-06-2020 at 0945 am    Task: RNCM: Alleviate Barriers to Glycemic Management   Note:   Care Management Activities:    - barriers to adherence to treatment plan identified - blood glucose monitoring encouraged - blood glucose readings reviewed - mutual A1C goal set or reviewed - resources required to improve adherence to care identified - self-awareness of signs/symptoms of hypo or hyperglycemia encouraged - use of blood glucose monitoring log promoted       Patient Care Plan: RNCM: Chronic Back  Pain (Adult)    Problem Identified: RNCM: Pain Management Plan (Chronic Pain)   Priority: High    Long-Range Goal: RNCM: Pain Management Plan Developed for Chroni Back pain   Priority: High  Note:   Current Barriers:  .  Knowledge Deficits related to management of Chronic back pain and osteoarthritis . Care Coordination needs related to effective management of pain in a patient with chronic back pain and discomfort . Chronic Disease Management support and education needs related to Chronic pain and discomfort  . Lacks caregiver support.  . Unable to independently manage pain and discomfort of chronic back pain . Does not adhere to provider recommendations re: years ago the patient admits she did not complete PT on her back from a work injury and feels this significantly impacts her ability to have relief now.  . Does not contact provider office for questions/concerns  Nurse Case Manager Clinical Goal(s):  Marland Kitchen Over the next 120 days, patient will verbalize understanding of plan for management of Chronic pain and discomfort  . Over the next 120 days, patient will work  with pcp and CCM to address needs related to ongoing chronic pain and discomfort . Over the next 120 days, patient will demonstrate a decrease in pain exacerbations as evidenced by compliance with the plan of care and exploring options that will benefit the patient and help with pain relief  . Over the next 120 days, patient will demonstrate improved adherence to prescribed treatment plan for chronic pain and discomfort  as evidenced bydecreased pain level  . Over the next 120 days, patient will demonstrate improved health management independence as evidenced byable to perform ADLS/IADLS  Interventions:  . 1:1 collaboration with Malfi, Lupita Raider, FNP regarding development and update of comprehensive plan of care as evidenced by provider attestation and co-signature . Inter-disciplinary care team collaboration (see longitudinal plan of care) . Evaluation of current treatment plan related to chronic pain and discomfort and patient's adherence to plan as established by provider. 04-12-2020: The patient is not working as much now and this is helping with the back pain and discomfort. 06-21-2020: The patient states she continues to have pain but she is managing with the use of Tylenol arthritis. She is taking it every other day. She states that she was taking Meloxicam but it made her dizzy and nauseated. She says she is sticking with the Tylenol and that is working well for her. She is optimistic and has decided not to let it stop her with what she wants to do.  . Advised patient to call the provider for changes in level or intensity of pain . Provided education to patient re: alternative pain relief methods and pacing activities. 06-21-2020: Review of activities and the patient states going to the gym was helpful but she has not been in a couple of months. Education provided on the benefits of working out and strengthening back muscles. The patient will work on making it a priority to go back to the gym.   . Reviewed medications with patient and discussed compliance. 06-21-2020: The patient is compliant with medications  . Discussed plans with patient for ongoing care management follow up and provided patient with direct contact information for care management team . Provided patient with pain relief educational materials related to pain and discomfort of chronic back pain.  . - mutually acceptable comfort goal set . - pain assessed . - pain management plan developed . - pain treatment goals reviewed . - patient response to treatment assessed . - careful application of heat or ice encouraged . - complementary therapy use encouraged . - deep breathing, relaxation and mindfulness use promoted . - effectiveness of pharmacologic therapy monitored . - motivation and barriers to change assessed and addressed . -  mutually acceptable comfort goal set . - participation in physical therapy encouraged . - anxiety related to change in medications acknowledged . - balance, gait and fall risk reviewed . - quality of sleep assessed . - safety plan developed . - sleep hygiene techniques encouraged- patient endorses sleeping about 6 hours a night and feeling rested when waking  . - strategies to improve or maintain safety promoted  Patient Goals/Self-Care Activities Over the next 120 days, patient will:  - Patient will self administer medications as prescribed Patient will attend all scheduled provider appointments Patient will call provider office for new concerns or questions Patient will work with BSW to address care coordination needs and will continue to work with the clinical team to address health care and disease management related needs.    Follow Up Plan: Telephone follow up appointment with care management team member scheduled for: 09-06-2020 at 0945  am      Task: RNCM: Partner to Develop Chronic Pain Management Plan   Note:   Care Management Activities:    - mutually acceptable  comfort goal set - pain assessed - pain management plan developed - pain treatment goals reviewed - patient response to treatment assessed      Problem Identified: RNCM: Chronic Pain Management (Chronic Pain)     Problem Identified: RNCM: Harm or Injury (Chronic Pain)   Priority: High    Patient Care Plan: RNCM: Management of Hypertension and HLD (Adult)    Problem Identified: RNCM: Hypertension and HLD Management  (Hypertension)   Priority: Medium  Note:   The patient has HTN and HLD   Long-Range Goal: RNCM: Management of HTN and HLD   Expected End Date: 07/12/2021  Priority: Medium  Note:   Objective:  . Last practice recorded BP readings:  . BP Readings from Last 3 Encounters: .  06/05/20 . 122/70 .  05/16/20 . 131/75 .  04/30/20 . 136/79 .   Marland Kitchen Lab Results .  Component . Value . Date .   Marland Kitchen CHOL . 104 . 04/12/2019 .   Marland Kitchen CHOL . 111 . 06/10/2018 .   Marland Kitchen CHOL . 100 . 03/03/2018 .   Marland Kitchen Lab Results .  Component . Value . Date .   Marland Kitchen HDL . 45 (L) . 04/12/2019 .   Marland Kitchen HDL . 39 (L) . 06/10/2018 .   Marland Kitchen HDL . 46 (L) . 03/03/2018 .   Marland Kitchen Lab Results .  Component . Value . Date .   Marland Kitchen [place] . 45 . 04/12/2019 .   Marland Kitchen [place] . 53 . 06/10/2018 .   Marland Kitchen [place] . 39 . 03/03/2018 .   Marland Kitchen Lab Results .  Component . Value . Date .   Marland Kitchen TRIG . 63 . 04/12/2019 .   Marland Kitchen TRIG . 102 . 06/10/2018 .   Marland Kitchen TRIG . 73 . 03/03/2018 .   Marland Kitchen Lab Results .  Component . Value . Date .   Marland Kitchen CHOLHDL . 2.3 . 04/12/2019 .   Marland Kitchen CHOLHDL . 2.8 . 06/10/2018 .   Marland Kitchen CHOLHDL . 2.2 . 03/03/2018 .   Marland Kitchen No results found for: LDLDIRECT   .  Takes Lipitor 40 mg QD  . Most recent eGFR/CrCl: No results found for: EGFR  No components found for: CRCL Current Barriers:  Marland Kitchen Knowledge Deficits related to basic understanding of hypertension and HLD pathophysiology and self care management . Knowledge Deficits related to understanding of medications prescribed for management of hypertension and HLD .  Unable to independently manage  chronic conditions of HTN and HLD . Does not contact provider office for questions/concerns Case Manager Clinical Goal(s):  Marland Kitchen Over the next 120 days, patient will verbalize understanding of plan for hypertension management . Over the next 120 days, patient will attend all scheduled medical appointments: saw pcp on 05-16-2020. Knows to call for changes in condition . Over the next 120 days, patient will demonstrate improved adherence to prescribed treatment plan for hypertension as evidenced by taking all medications as prescribed, monitoring and recording blood pressure as directed, adhering to low sodium/DASH diet . Over the next 120 days, patient will demonstrate improved health management independence as evidenced by checking blood pressure as directed and notifying PCP if SBP>160 or DBP > 90, taking all medications as prescribe, and adhering to a low sodium diet as discussed. Interventions:  . Evaluation of current treatment plan related to hypertension self management and patient's adherence to plan as established by provider. 06-21-2020: The patient is compliant with heart healthy diet, is working on going back to the gym to help with improving chronic conditions. Review of heart healthy/ADA restrictions as the patient is going out of town this weekend for a wedding.  . Provided education to patient re: stroke prevention, s/s of heart attack and stroke, DASH diet, complications of uncontrolled blood pressure . Reviewed medications with patient and discussed importance of compliance. 06-21-2020: The patient is compliant with medications regimen.  . Discussed plans with patient for ongoing care management follow up and provided patient with direct contact information for care management team . Advised patient, providing education and rationale, to monitor blood pressure daily and record, calling PCP for findings outside established parameters.  . Review of recent sinus/cold congestion. Took COVID test  and the test was negative. 06-21-2020: Denies any issues related to cold and sinus issues. States she is still using her mask.  . - blood pressure trends reviewed . - depression screen reviewed . - home or ambulatory blood pressure monitoring encouraged . - healthy diet promoted . - healthy family lifestyle promoted . - medication side effects managed . - pain assessed and managed . - patient response to treatment assessed . - quality of sleep assessed . - reduction of dietary sodium encouraged . - reduction in sedentary activities encouraged . - response to pharmacologic therapy monitored . - sleep hygiene techniques encouraged Patient Goals/Self-Care Activities . Over the next 120 days, patient will:  - Calls provider office for new concerns, questions, or BP outside discussed parameters Checks BP and records as discussed Follows a low sodium diet/DASH diet Follow Up Plan: Telephone follow up appointment with care management team member scheduled for:  09-06-2020 at 0945 am   Task: RNCM: Identify and Monitor Blood Pressure Elevation   Note:   Care Management Activities:    - blood pressure trends reviewed - depression screen reviewed - home or ambulatory blood pressure monitoring encouraged      Problem Identified: RNCM: Disease Progression (Hypertension)   Priority: Medium  Note:   See goal for HTN     Patient verbalizes understanding of instructions provided today and agrees to view in [place].   Telephone follow up appointment with care management team member scheduled for: 09-06-2020 at [place] am  Noreene Larsson RN, MSN, [place] Mobile: [phone]

## 2020-06-30 ENCOUNTER — Other Ambulatory Visit: Payer: Self-pay | Admitting: Family Medicine

## 2020-06-30 DIAGNOSIS — E1165 Type 2 diabetes mellitus with hyperglycemia: Secondary | ICD-10-CM

## 2020-06-30 DIAGNOSIS — E785 Hyperlipidemia, unspecified: Secondary | ICD-10-CM

## 2020-06-30 DIAGNOSIS — I10 Essential (primary) hypertension: Secondary | ICD-10-CM

## 2020-06-30 DIAGNOSIS — E1169 Type 2 diabetes mellitus with other specified complication: Secondary | ICD-10-CM

## 2020-06-30 DIAGNOSIS — Z794 Long term (current) use of insulin: Secondary | ICD-10-CM

## 2020-06-30 NOTE — Telephone Encounter (Signed)
Requested medication (s) are due for refill today: yes  Requested medication (s) are on the active medication list: yes  Last refill:  atorvastatin: 04/13/20        spironolactone: 04/13/20 #90  Future visit scheduled: no  Notes to clinic:  overdue lab work   Requested Prescriptions  Pending Prescriptions Disp Refills   atorvastatin (LIPITOR) 40 MG tablet [Pharmacy Med Name: Atorvastatin Calcium 40 MG Oral Tablet] 90 tablet 0    Sig: Take 1 tablet by mouth once daily      Cardiovascular:  Antilipid - Statins Failed - 06/30/2020 11:36 AM      Failed - Total Cholesterol in normal range and within 360 days    Cholesterol  Date Value Ref Range Status  04/12/2019 104 <200 mg/dL Final          Failed - LDL in normal range and within 360 days    LDL Cholesterol (Calc)  Date Value Ref Range Status  04/12/2019 45 mg/dL (calc) Final    Comment:    Reference range: <100 . Desirable range <100 mg/dL for primary prevention;   <70 mg/dL for patients with CHD or diabetic patients  with > or = 2 CHD risk factors. Marland Kitchen LDL-C is now calculated using the Martin-Hopkins  calculation, which is a validated novel method providing  better accuracy than the Friedewald equation in the  estimation of LDL-C.  Cresenciano Genre et al. Annamaria Helling. 1829;937(16): 2061-2068  (http://education.[employer].com/faq/FAQ164)           Failed - HDL in normal range and within 360 days    HDL  Date Value Ref Range Status  04/12/2019 45 (L) > OR = 50 mg/dL Final          Failed - Triglycerides in normal range and within 360 days    Triglycerides  Date Value Ref Range Status  04/12/2019 63 <150 mg/dL Final          Passed - Patient is not pregnant      Passed - Valid encounter within last 12 months    Recent Outpatient Visits           1 month ago Postmenopausal bleeding   [facility] [place], Devonne Doughty, DO   2 months ago Type 2 diabetes mellitus with hyperglycemia, without long-term  current use of insulin [facility])   [place], DO   5 months ago Type 2 diabetes mellitus with hyperglycemia, without long-term current use of insulin [facility])   [facility], Lupita Raider, FNP   8 months ago Acute back pain, unspecified back location, unspecified back pain laterality   [facility], Lupita Raider, FNP   9 months ago Type 2 diabetes mellitus with hyperglycemia, without long-term current use of insulin [facility])   [facility], Lupita Raider, FNP       Future Appointments             In 10 months [facility], [facility]                spironolactone (ALDACTONE) 25 MG tablet [Pharmacy Med Name: Spironolactone 25 MG Oral Tablet] 90 tablet 0    Sig: Take 1 tablet by mouth once daily      Cardiovascular: Diuretics - Aldosterone Antagonist Failed - 06/30/2020 11:36 AM      Failed - Cr in normal range and within 360 days    Creat  Date  Value Ref Range Status  04/12/2019 0.82 0.50 - 0.99 mg/dL Final    Comment:    For patients >57 years of age, the reference limit for Creatinine is approximately 13% higher for people identified as African-American. .    Creatinine, Urine  Date Value Ref Range Status  04/12/2019 109 20 - 275 mg/dL Final          Failed - K in normal range and within 360 days    Potassium  Date Value Ref Range Status  04/12/2019 4.8 3.5 - 5.3 mmol/L Final          Failed - Na in normal range and within 360 days    Sodium  Date Value Ref Range Status  04/12/2019 141 135 - 146 mmol/L Final          Passed - Last BP in normal range    BP Readings from Last 1 Encounters:  06/05/20 122/70          Passed - Valid encounter within last 6 months    Recent Outpatient Visits           1 month ago Postmenopausal bleeding   [place], DO   2 months ago Type 2 diabetes mellitus with hyperglycemia, without  long-term current use of insulin [facility])   [facility], Devonne Doughty, DO   5 months ago Type 2 diabetes mellitus with hyperglycemia, without long-term current use of insulin [facility])   [facility], Lupita Raider, FNP   8 months ago Acute back pain, unspecified back location, unspecified back pain laterality   [place], FNP   9 months ago Type 2 diabetes mellitus with hyperglycemia, without long-term current use of insulin [facility])   [facility], Lupita Raider, FNP       Future Appointments             In 10 months [facility], [facility]

## 2020-07-02 ENCOUNTER — Other Ambulatory Visit: Payer: Self-pay

## 2020-07-02 ENCOUNTER — Ambulatory Visit
Admission: RE | Admit: 2020-07-02 | Discharge: 2020-07-02 | Disposition: A | Discharge status: Home / Self Care | Payer: PPO | Source: Ambulatory Visit | Attending: Obstetrics and Gynecology | Admitting: Obstetrics and Gynecology

## 2020-07-02 DIAGNOSIS — N950 Postmenopausal bleeding: Secondary | ICD-10-CM | POA: Insufficient documentation

## 2020-07-02 DIAGNOSIS — D259 Leiomyoma of uterus, unspecified: Secondary | ICD-10-CM | POA: Diagnosis not present

## 2020-07-02 DIAGNOSIS — N930 Postcoital and contact bleeding: Secondary | ICD-10-CM | POA: Insufficient documentation

## 2020-07-03 ENCOUNTER — Ambulatory Visit: Payer: PPO | Admitting: Obstetrics and Gynecology

## 2020-07-09 ENCOUNTER — Encounter: Payer: Self-pay | Admitting: Obstetrics and Gynecology

## 2020-07-09 ENCOUNTER — Ambulatory Visit: Payer: PPO | Admitting: Obstetrics and Gynecology

## 2020-07-09 ENCOUNTER — Other Ambulatory Visit: Payer: Self-pay

## 2020-07-09 VITALS — BP 122/70 | Ht 62.0 in | Wt 161.8 lb

## 2020-07-09 DIAGNOSIS — N950 Postmenopausal bleeding: Secondary | ICD-10-CM

## 2020-07-09 NOTE — Progress Notes (Signed)
Patient ID: Summer Young, female   DOB: September 22, 1950, 70 y.o.   MRN: [id number]  Reason for Consult: Gynecologic Exam   Referred by [name], FNP  Subjective:     HPI:  Summer Young is a 70 y.o. female . She reports she has been well. She denies further vginal bleeding.   Gynecological History  No LMP recorded. Patient is postmenopausal.   Past Medical History:  Diagnosis Date  . Allergy   . Chronic pain of both knees   . Diabetes mellitus without complication ([place])   . Hyperlipidemia   . Hypertension    Family History  Problem Relation Age of Onset  . Diabetes Mother   . Diabetes Father   . Stroke Father   . Diabetes Sister   . Heart attack Other   . Diabetes Sister   . Diabetes Sister   . Diabetes Sister    Past Surgical History:  Procedure Laterality Date  . ANKLE SURGERY Left 2009  . DILATION AND CURETTAGE, DIAGNOSTIC / THERAPEUTIC  1998   miscarriage  . KNEE SURGERY Left 1994   ACL    Short Social History:  Social History   Tobacco Use  . Smoking status: Never Smoker  . Smokeless tobacco: Never Used  Substance Use Topics  . Alcohol use: Yes    Alcohol/week: 0.0 - 1.0 standard drinks    Comment: occasional    Allergies  Allergen Reactions  . Aspirin Hives    Other reaction(s): Other (See Comments) Other Reaction: Not Assessed   . Insulin Detemir Hives and Rash    Errythema, edema, heat at site of injection did not improve after 2 weeks of use.    . Shellfish Allergy Anaphylaxis  . Dapagliflozin Other (See Comments)    Yeast infections Yeast infection Farxiga  . Influenza Vaccines Other (See Comments)    Forxiga  . Other     Other reaction(s): Other (See Comments) Uncoded Allergy. Allergen: Shellfish, Other Reaction: Not Assessed    Flu vaccine given 1998 caused anaphylaxis    Current Outpatient Medications  Medication Sig Dispense Refill  . acetaminophen (TYLENOL) 500 MG tablet Take 650 mg by mouth 2 (two) times daily as  needed.    Marland Kitchen alendronate (FOSAMAX) 70 MG tablet Take 1 tablet (70 mg total) by mouth once a week. Take with a full glass of water on an empty stomach. 12 tablet 1  . Ascorbic Acid (VITAMIN C) 1000 MG tablet Take 1,000 mg by mouth daily as needed.     Marland Kitchen atorvastatin (LIPITOR) 40 MG tablet Take 1 tablet (40 mg total) by mouth daily. 90 tablet 0  . Biotin 1000 MCG tablet Take 4,000 mcg by mouth every other day.     . blood glucose meter kit and supplies Dispense based on patient and insurance preference. Use up to four times daily as directed. (FOR ICD-10 E10.9, E11.9). 1 each 0  . Cholecalciferol (VITAMIN D3) 50 MCG (2000 UT) capsule Take 1,000 Units by mouth daily.     . diclofenac Sodium (VOLTAREN) 1 % GEL Apply 2 g topically 3 (three) times daily as needed. 100 g 2  . insulin degludec (TRESIBA FLEXTOUCH) 100 UNIT/ML SOPN FlexTouch Pen Inject 0.26 mLs (26 Units total) into the skin daily. (Patient taking differently: Inject 20 Units into the skin daily.) 3 pen 5  . metFORMIN (GLUCOPHAGE-XR) 500 MG 24 hr tablet TAKE 2 TABLETS BY MOUTH ONCE DAILY WITH BREAKFAST 180 tablet 3  . ONETOUCH ULTRA test  strip Check blood sugar 1 x daily as directed 100 each 12  . polyethylene glycol powder (GLYCOLAX/MIRALAX) 17 GM/SCOOP powder Take 17 g by mouth 2 (two) times daily as needed. 3350 g 1  . Semaglutide, 1 MG/DOSE, (OZEMPIC, 1 MG/DOSE,) 2 MG/1.5ML SOPN INJECT 1 MG SUBCUTANEOUSLY ONCE A WEEK 4 mL 0  . Skin Protectants, Misc. (AMERIDERM PERISHIELD) 3.8 % OINT Apply to skin under breast as needed 100 g 5  . spironolactone (ALDACTONE) 25 MG tablet Take 1 tablet (25 mg total) by mouth daily. 90 tablet 0  . vitamin B-12 (CYANOCOBALAMIN) 1000 MCG tablet Take 1,000 mcg by mouth daily.      No current facility-administered medications for this visit.    Review of Systems  Constitutional: Negative for chills, fatigue, fever and unexpected weight change.  HENT: Negative for trouble swallowing.  Eyes: Negative for  loss of vision.  Respiratory: Negative for cough, shortness of breath and wheezing.  Cardiovascular: Negative for chest pain, leg swelling, palpitations and syncope.  GI: Negative for abdominal pain, blood in stool, diarrhea, nausea and vomiting.  GU: Negative for difficulty urinating, dysuria, frequency and hematuria.  Musculoskeletal: Negative for back pain, leg pain and joint pain.  Skin: Negative for rash.  Neurological: Negative for dizziness, headaches, light-headedness, numbness and seizures.  Psychiatric: Negative for behavioral problem, confusion, depressed mood and sleep disturbance.        Objective:  Objective   Vitals:   07/09/20 1603  BP: 122/70  Weight: 161 lb 12.8 oz (73.4 kg)  Height: _0  (1.575 m)   Body mass index is 29.59 kg/m.  Physical Exam Vitals and nursing note reviewed. Exam conducted with a chaperone present.  Constitutional:      Appearance: Normal appearance.  HENT:     Head: Normocephalic and atraumatic.  Eyes:     Extraocular Movements: Extraocular movements intact.     Pupils: Pupils are equal, round, and reactive to light.  Cardiovascular:     Rate and Rhythm: Normal rate and regular rhythm.  Pulmonary:     Effort: Pulmonary effort is normal.     Breath sounds: Normal breath sounds.  Abdominal:     General: Abdomen is flat.     Palpations: Abdomen is soft.  Musculoskeletal:     Cervical back: Normal range of motion.  Skin:    General: Skin is warm and dry.  Neurological:     General: No focal deficit present.     Mental Status: She is alert and oriented to person, place, and time.  Psychiatric:        Behavior: Behavior normal.        Thought Content: Thought content normal.        Judgment: Judgment normal.     Assessment/Plan:    69 yo with postmenopausal bleeding, now resolved. Reviewed normal pelvic US finding. Reassurance. Advised patient to return if bleeding returns in the future.   More than 5 minutes were spent  face to face with the patient in the room, reviewing the medical record, labs and images, and coordinating care for the patient. The plan of management was discussed in detail and counseling was provided.     Adrian Prows MD [facility], [facility] 07/09/2020 4:27 PM

## 2020-07-11 DIAGNOSIS — Z20822 Contact with and (suspected) exposure to covid-19: Secondary | ICD-10-CM | POA: Diagnosis not present

## 2020-08-01 ENCOUNTER — Other Ambulatory Visit: Payer: Self-pay | Admitting: Family Medicine

## 2020-08-01 DIAGNOSIS — Z794 Long term (current) use of insulin: Secondary | ICD-10-CM

## 2020-08-01 DIAGNOSIS — E785 Hyperlipidemia, unspecified: Secondary | ICD-10-CM

## 2020-08-01 DIAGNOSIS — I10 Essential (primary) hypertension: Secondary | ICD-10-CM

## 2020-08-06 ENCOUNTER — Ambulatory Visit (INDEPENDENT_AMBULATORY_CARE_PROVIDER_SITE_OTHER): Payer: PPO | Admitting: Pharmacist

## 2020-08-06 DIAGNOSIS — E782 Mixed hyperlipidemia: Secondary | ICD-10-CM | POA: Diagnosis not present

## 2020-08-06 DIAGNOSIS — E1165 Type 2 diabetes mellitus with hyperglycemia: Secondary | ICD-10-CM | POA: Diagnosis not present

## 2020-08-06 NOTE — Chronic Care Management (AMB) (Signed)
  Chronic Care Management Pharmacy Note  08/06/2020 Name:  Summer Young MRN:  [id number] DOB:  10/28/1950   Subjective: Summer Young is an 69 y.o. year old female who is a primary patient of Malfi, Nicole M, FNP.  The CCM team was consulted for assistance with disease management and care coordination needs.    Engaged with patient by telephone for follow up visit in response to provider referral for pharmacy case management and/or care coordination services.   Consent to Services:  The patient was given information about Chronic Care Management services, agreed to services, and gave verbal consent prior to initiation of services.  Please see initial visit note for detailed documentation.   Patient Care Team: Malfi, Nicole M, FNP as PCP - General (Family Medicine) Dhalla, Elisabeth A, RPH-CPP as Pharmacist Tate, Pamela J, RN as Case Manager (General Practice) Lewis, Jasmine D, LCSW as Social Worker (Licensed Clinical Social Worker)   Recent consult visits: Office Visit with [facility] for follow up on 5/23  Hospital visits: None in previous 6 months  Objective:  Lab Results  Component Value Date   CREATININE 0.82 04/12/2019   CREATININE 0.79 06/10/2018   CREATININE 0.87 03/03/2018    Lab Results  Component Value Date   HGBA1C 7.2 (A) 04/30/2020   Last diabetic Eye exam:  Lab Results  Component Value Date/Time   HMDIABEYEEXA No Retinopathy 12/27/2018 12:00 AM    Last diabetic Foot exam: No results found for: HMDIABFOOTEX      Component Value Date/Time   CHOL 104 04/12/2019 0818   TRIG 63 04/12/2019 0818   HDL 45 (L) 04/12/2019 0818   CHOLHDL 2.3 04/12/2019 0818   LDLCALC 45 04/12/2019 0818    Hepatic Function Latest Ref Rng & Units 04/12/2019 06/10/2018 03/03/2018  Total Protein 6.1 - 8.1 g/dL 7.2 6.7 6.5  AST 10 - 35 U/L 18 18 18  ALT 6 - 29 U/L 17 20 19  Total Bilirubin 0.2 - 1.2 mg/dL 0.5 0.5 0.5    Social History   Tobacco Use  Smoking  Status Never  Smokeless Tobacco Never   BP Readings from Last 3 Encounters:  07/09/20 122/70  06/05/20 122/70  05/16/20 131/75   Pulse Readings from Last 3 Encounters:  05/16/20 93  04/30/20 86  01/18/20 92   Wt Readings from Last 3 Encounters:  07/09/20 161 lb 12.8 oz (73.4 kg)  06/05/20 163 lb 6.4 oz (74.1 kg)  05/16/20 162 lb 12.8 oz (73.8 kg)    Assessment: Review of patient past medical history, allergies, medications, health status, including review of consultants reports, laboratory and other test data, was performed as part of comprehensive evaluation and provision of chronic care management services.   SDOH:  (Social Determinants of Health) assessments and interventions performed: none   CCM Care Plan  Allergies  Allergen Reactions   Aspirin Hives    Other reaction(s): Other (See Comments) Other Reaction: Not Assessed    Insulin Detemir Hives and Rash    Errythema, edema, heat at site of injection did not improve after 2 weeks of use.     Shellfish Allergy Anaphylaxis   Dapagliflozin Other (See Comments)    Yeast infections Yeast infection Farxiga   Influenza Vaccines Other (See Comments)    Forxiga   Other     Other reaction(s): Other (See Comments) Uncoded Allergy. Allergen: Shellfish, Other Reaction: Not Assessed    Flu vaccine given 1998 caused anaphylaxis    Medications Reviewed Today       Reviewed by Vella Raring, RPH-CPP (Pharmacist) on 08/06/20 at [phone]  Med List Status: <None>   Medication Order Taking? Sig Documenting Provider Last Dose Status Informant  acetaminophen (TYLENOL) 500 MG tablet [id number]  Take 650 mg by mouth 2 (two) times daily as needed. [provider]  Active   alendronate (FOSAMAX) 70 MG tablet [id number] Yes Take 1 tablet (70 mg total) by mouth once a week. Take with a full glass of water on an empty stomach. Olin Hauser, DO Taking Active   Ascorbic Acid (VITAMIN C) 1000 MG tablet [id number] Yes Take  1,000 mg by mouth daily as needed.  [provider] Taking Active            Med Note [place] C[place], [facility]   Mon Sep 20, 2018  4:05 PM) As needed to prevent illness  atorvastatin (LIPITOR) 40 MG tablet [id number] Yes Take 1 tablet by mouth once daily Olin Hauser, DO Taking Active   Biotin 1000 MCG tablet [id number]  Take 4,000 mcg by mouth every other day.  [provider]  Active Self           Med Note Barrington Ellison   Tue Jan 19, 2018 11:31 AM) [name] every other day  blood glucose meter kit and supplies [id number]  Dispense based on patient and insurance preference. Use up to four times daily as directed. (FOR ICD-10 E10.9, E11.9). [name], FNP  Active   Cholecalciferol (VITAMIN D3) 50 MCG (2000 UT) capsule [id number] Yes Take 1,000 Units by mouth daily.  [provider] Taking Active   diclofenac Sodium (VOLTAREN) 1 % GEL [id number] No Apply 2 g topically 3 (three) times daily as needed.  Patient not taking: Reported on 08/06/2020   Olin Hauser, DO Not Taking Active   insulin degludec (TRESIBA FLEXTOUCH) 100 UNIT/ML SOPN FlexTouch Pen [id number] Yes Inject 0.26 mLs (26 Units total) into the skin daily.  Patient taking differently: Inject 20 Units into the skin daily.   [name], NP Taking Active   metFORMIN (GLUCOPHAGE-XR) 500 MG 24 hr tablet [id number] Yes TAKE 2 TABLETS BY MOUTH ONCE DAILY WITH BREAKFAST Karamalegos, Devonne Doughty, DO Taking Active   [facility] ULTRA test strip [id number]  Check blood sugar 1 x daily as directed Olin Hauser, DO  Active   polyethylene glycol powder (GLYCOLAX/MIRALAX) 17 GM/SCOOP powder [id number] No Take 17 g by mouth 2 (two) times daily as needed.  Patient not taking: Reported on 08/06/2020   [name], FNP Not Taking Active   Semaglutide, 1 MG/DOSE, (OZEMPIC, 1 MG/DOSE,) 2 MG/1.5ML SOPN [id number] Yes INJECT 1 MG SUBCUTANEOUSLY ONCE A WEEK Karamalegos, Devonne Doughty, DO Taking  Active   Skin Protectants, Misc. (AMERIDERM PERISHIELD) 3.8 % OINT [id number]  Apply to skin under breast as needed Olin Hauser, DO  Active   spironolactone (ALDACTONE) 25 MG tablet [id number] Yes Take 1 tablet by mouth once daily Olin Hauser, DO Taking Active   vitamin B-12 (CYANOCOBALAMIN) 1000 MCG tablet [id number] Yes Take 1,000 mcg by mouth daily.  [provider] Taking Active             Patient Active Problem List   Diagnosis Date Noted   Postmenopausal bleeding 05/16/2020   Osteoarthritis of knees, bilateral 05/09/2020   Calcium pyrophosphate deposition disease (CPPD) 05/09/2020   Cough 01/18/2020   Fever 01/18/2020   Acute back pain 10/28/2019   Immunization due 09/19/2019   Osteoporosis 08/16/2019  Screening for osteoporosis 06/16/2019   Screening for breast cancer 06/16/2019   Fatigue 05/03/2019   Spondylosis of lumbar region without myelopathy or radiculopathy 02/24/2019   Allergic rhinitis 02/26/2018   Hyperlipidemia 02/26/2018   Diabetes mellitus (HCC) 02/26/2018   Abdominal pain 02/26/2018   Cataract cortical, senile 01/18/2018   Seasonal allergic rhinitis due to pollen 07/24/2016   Chronic bilateral low back pain without sciatica 11/22/2014   Chronic pain of both knees 11/22/2014   Essential hypertension 11/22/2014   Dyslipidemia associated with type 2 diabetes mellitus (HCC) 11/22/2014   Type 2 diabetes mellitus with hyperglycemia (HCC) 01/19/2013    Immunization History  Administered Date(s) Administered   PFIZER(Purple Top)SARS-COV-2 Vaccination 04/21/2019, 05/12/2019, 12/17/2019   Pneumococcal Conjugate-13 11/18/2017   Pneumococcal Polysaccharide-23 09/20/2008   Tdap 04/23/2007, 07/16/2017   Zoster, Live 11/12/2015    Conditions to be addressed/monitored: HLD and DMII  Care Plan : General Pharmacy (Adult)  Updates made by Dhalla, Elisabeth A, RPH-CPP since 08/06/2020 12:00 AM     Problem: Disease Progression       Long-Range Goal: Disease Progression Prevented or Minimized   Start Date: 01/23/2020  Expected End Date: 04/22/2020  This Visit's Progress: On track  Recent Progress: On track  Priority: High  Note:   Current Barriers:  Unable to independently afford treatment regimen Patient APPROVED for Tresiba and Ozempic medication assistance from [employer] for 2022 calendar year  Pharmacist Clinical Goal(s):  Over the next 90 days, patient will achieve control of T2DM as evidenced by A1C <7.0%. through collaboration with PharmD and provider.  Over the next 90 days, patient will maintain control of LDL as evidenced by LDL <70 mg/dL through collaboration with PharmD and provider.  Over the next 90 days, patient will verbalize ability to afford treatment regimen. through collaboration with PharmD and provider.  Interventions: Inter-disciplinary care team collaboration (see longitudinal plan of care) 1:1 collaboration with Karamalegos, Alexander J, DO regarding development and update of comprehensive plan of care as evidenced by provider attestation and co-signature Perform chart review Patient seen for Office Visit with [facility] for follow up on 5/23 Comprehensive medication review performed; medication list updated in electronic medical record  Diabetes: Control close to goal based on latest A1C result; current treatment: Tresiba 20 units once daily Metformin ER 500 mg - 2 tablets daily with breakfast Ozempic 1 mg once weekly Current glucose readings: fasting glucose ranging: 120s-140s Denies hypoglycemia Note patient carries glucose tablets with her Reports had increased appetite while back in [place], enjoying eating more spicy food and fruit, but getting back on regular diet with return home, limiting carbohydrate portion sizes again    Lower Back Pain: Reports continues to have lower back pain since has returned to work, and primarily using acetaminophen 650  mg tablet twice daily as needed for control 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, but reports she is now thinking about retiring Listen as patient discusses her plan to move back to [place] in the future and talks about barriers, including language, for discussing this plan with her husband States that she will reach out to CM Social Worker to talk about her plan as well Encourage patient in her plan to try massages as offered through her gym and working on building up core strength working with gym trainer  Hyperlipidemia: Controlled; current treatment: atorvastatin 40 mg daily  Have counseled patient on importance of medication adherence Have discussed dietary management  Medication Assistance Reports   that she has a supply of [place] and Ozempic as received latest refill from [employer] patient assistance program in early May Reports also recently received the re-enrollment application for 4158 calendar year in the mail from [employer].  Reports that she will get paperwork together and then send this to [facility]    Patient Goals/Self-Care Activities Over the next 90 days, patient will:  Take medications as prescribed Check blood glucose daily, document, and provide at future appointments Keeps log in App on phone Collaborate with provider on medication access solutions Attends all scheduled provider appointments Calls pharmacy for medication refills Calls provider office for new concerns or questions Follow Up Plan: Telephone follow up appointment with care management team member scheduled for: 8/3 at 8:30 am     Medication Assistance: Patient APPROVED for [name] and Ozempic medication assistance from [employer] for 2022 calendar year  Patient's preferred pharmacy is:  [facility] [place], [place] - [address] [place] [address] Phone: [phone] Fax:  [phone]   Follow Up:  Patient agrees to Care Plan and Follow-up.  [name], PharmD, Para [place], CPP Clinical Pharmacist [facility] [phone]

## 2020-08-06 NOTE — Patient Instructions (Signed)
Visit Information  PATIENT GOALS:  Goals Addressed             This Visit's Progress    Pharmacy Goals       Our goal A1c is less than 7%. This corresponds with fasting sugars less than 130 and 2 hour after meal sugars less than 180. Please check your blood sugars and keep log of results  Our goal bad cholesterol, or LDL, is less than 70 . This is why it is important to continue taking your atorvastatin  Please check your home blood pressure, keep a log of the results and bring this with you to your medical appointments.  Feel free to call me with any questions or concerns. I look forward to our next call!   [name], PharmD, [place] [phone]          The patient verbalized understanding of instructions, educational materials, and care plan provided today and declined offer to receive copy of patient instructions, educational materials, and care plan.   Telephone follow up appointment with care management team member scheduled for:8/3 at 8:30 am

## 2020-08-27 ENCOUNTER — Ambulatory Visit (INDEPENDENT_AMBULATORY_CARE_PROVIDER_SITE_OTHER): Payer: PPO | Admitting: Internal Medicine

## 2020-08-27 ENCOUNTER — Ambulatory Visit
Admission: RE | Admit: 2020-08-27 | Discharge: 2020-08-27 | Disposition: A | Discharge status: Home / Self Care | Payer: PPO | Attending: Internal Medicine | Admitting: Internal Medicine

## 2020-08-27 ENCOUNTER — Encounter: Payer: Self-pay | Admitting: Internal Medicine

## 2020-08-27 ENCOUNTER — Ambulatory Visit
Admission: RE | Admit: 2020-08-27 | Discharge: 2020-08-27 | Disposition: A | Discharge status: Home / Self Care | Payer: PPO | Source: Ambulatory Visit | Attending: Internal Medicine | Admitting: Internal Medicine

## 2020-08-27 ENCOUNTER — Other Ambulatory Visit: Payer: Self-pay

## 2020-08-27 VITALS — BP 140/77 | HR 85 | Temp 97.7°F | Resp 17 | Ht 62.0 in | Wt 162.4 lb

## 2020-08-27 DIAGNOSIS — E1165 Type 2 diabetes mellitus with hyperglycemia: Secondary | ICD-10-CM | POA: Diagnosis not present

## 2020-08-27 DIAGNOSIS — I10 Essential (primary) hypertension: Secondary | ICD-10-CM

## 2020-08-27 DIAGNOSIS — M25552 Pain in left hip: Secondary | ICD-10-CM | POA: Diagnosis not present

## 2020-08-27 DIAGNOSIS — E1169 Type 2 diabetes mellitus with other specified complication: Secondary | ICD-10-CM | POA: Diagnosis not present

## 2020-08-27 DIAGNOSIS — G8929 Other chronic pain: Secondary | ICD-10-CM

## 2020-08-27 DIAGNOSIS — E663 Overweight: Secondary | ICD-10-CM

## 2020-08-27 DIAGNOSIS — M545 Low back pain, unspecified: Secondary | ICD-10-CM | POA: Diagnosis not present

## 2020-08-27 DIAGNOSIS — M25562 Pain in left knee: Secondary | ICD-10-CM | POA: Diagnosis not present

## 2020-08-27 DIAGNOSIS — M25551 Pain in right hip: Secondary | ICD-10-CM | POA: Diagnosis not present

## 2020-08-27 DIAGNOSIS — Z6829 Body mass index (BMI) 29.0-29.9, adult: Secondary | ICD-10-CM

## 2020-08-27 DIAGNOSIS — E785 Hyperlipidemia, unspecified: Secondary | ICD-10-CM

## 2020-08-27 DIAGNOSIS — M810 Age-related osteoporosis without current pathological fracture: Secondary | ICD-10-CM

## 2020-08-27 DIAGNOSIS — M25561 Pain in right knee: Secondary | ICD-10-CM

## 2020-08-27 DIAGNOSIS — M1612 Unilateral primary osteoarthritis, left hip: Secondary | ICD-10-CM | POA: Diagnosis not present

## 2020-08-27 MED ORDER — CYCLOBENZAPRINE HCL 5 MG PO TABS: 5.0000 mg | ORAL_TABLET | Freq: Every evening | ORAL | 0 refills | Status: DC | PRN

## 2020-08-27 MED ORDER — KETOROLAC TROMETHAMINE 60 MG/2ML IM SOLN
60.0000 mg | Freq: Once | INTRAMUSCULAR | Status: AC
  Administered 2020-08-27: 60 mg via INTRAMUSCULAR

## 2020-08-27 NOTE — Patient Instructions (Signed)
Back Exercises The following exercises strengthen the muscles that help to support the trunk and back. They also help to keep the lower back flexible. Doing these exercises can help to prevent back pain or lessen existing pain. If you have back pain or discomfort, try doing these exercises 2-3 times each day or as told by your health care provider. As your pain improves, do them once each day, but increase the number of times that you repeat the steps for each exercise (do more repetitions). To prevent the recurrence of back pain, continue to do these exercises once each day or as told by your health care provider. Do exercises exactly as told by your health care provider and adjust them as directed. It is normal to feel mild stretching, pulling, tightness, or discomfort as you do these exercises, but you should stop right away if youfeel sudden pain or your pain gets worse. Exercises Single knee to chest Repeat these steps 3-5 times for each leg: Lie on your back on a firm bed or the floor with your legs extended. Bring one knee to your chest. Your other leg should stay extended and in contact with the floor. Hold your knee in place by grabbing your knee or thigh with both hands and hold. Pull on your knee until you feel a gentle stretch in your lower back or buttocks. Hold the stretch for 10-30 seconds. Slowly release and straighten your leg. Pelvic tilt Repeat these steps 5-10 times: Lie on your back on a firm bed or the floor with your legs extended. Bend your knees so they are pointing toward the ceiling and your feet are flat on the floor. Tighten your lower abdominal muscles to press your lower back against the floor. This motion will tilt your pelvis so your tailbone points up toward the ceiling instead of pointing to your feet or the floor. With gentle tension and even breathing, hold this position for 5-10 seconds. Cat-cow Repeat these steps until your lower back becomes more  flexible: Get into a hands-and-knees position on a firm surface. Keep your hands under your shoulders, and keep your knees under your hips. You may place padding under your knees for comfort. Let your head hang down toward your chest. Contract your abdominal muscles and point your tailbone toward the floor so your lower back becomes rounded like the back of a cat. Hold this position for 5 seconds. Slowly lift your head, let your abdominal muscles relax and point your tailbone up toward the ceiling so your back forms a sagging arch like the back of a cow. Hold this position for 5 seconds.  Press-ups Repeat these steps 5-10 times: Lie on your abdomen (face-down) on the floor. Place your palms near your head, about shoulder-width apart. Keeping your back as relaxed as possible and keeping your hips on the floor, slowly straighten your arms to raise the top half of your body and lift your shoulders. Do not use your back muscles to raise your upper torso. You may adjust the placement of your hands to make yourself more comfortable. Hold this position for 5 seconds while you keep your back relaxed. Slowly return to lying flat on the floor.  Bridges Repeat these steps 10 times: Lie on your back on a firm surface. Bend your knees so they are pointing toward the ceiling and your feet are flat on the floor. Your arms should be flat at your sides, next to your body. Tighten your buttocks muscles and lift your   buttocks off the floor until your waist is at almost the same height as your knees. You should feel the muscles working in your buttocks and the back of your thighs. If you do not feel these muscles, slide your feet 1-2 inches farther away from your buttocks. Hold this position for 3-5 seconds. Slowly lower your hips to the starting position, and allow your buttocks muscles to relax completely. If this exercise is too easy, try doing it with your arms crossed over yourchest. Abdominal  crunches Repeat these steps 5-10 times: Lie on your back on a firm bed or the floor with your legs extended. Bend your knees so they are pointing toward the ceiling and your feet are flat on the floor. Cross your arms over your chest. Tip your chin slightly toward your chest without bending your neck. Tighten your abdominal muscles and slowly raise your trunk (torso) high enough to lift your shoulder blades a tiny bit off the floor. Avoid raising your torso higher than that because it can put too much stress on your low back and does not help to strengthen your abdominal muscles. Slowly return to your starting position. Back lifts Repeat these steps 5-10 times: Lie on your abdomen (face-down) with your arms at your sides, and rest your forehead on the floor. Tighten the muscles in your legs and your buttocks. Slowly lift your chest off the floor while you keep your hips pressed to the floor. Keep the back of your head in line with the curve in your back. Your eyes should be looking at the floor. Hold this position for 3-5 seconds. Slowly return to your starting position. Contact a health care provider if: Your back pain or discomfort gets much worse when you do an exercise. Your worsening back pain or discomfort does not lessen within 2 hours after you exercise. If you have any of these problems, stop doing these exercises right away. Do not do them again unless your health care provider says that you can. Get help right away if: You develop sudden, severe back pain. If this happens, stop doing the exercises right away. Do not do them again unless your health care provider says that you can. This information is not intended to replace advice given to you by your health care provider. Make sure you discuss any questions you have with your healthcare provider. Document Revised: 06/10/2018 Document Reviewed: 11/05/2017 Elsevier Patient Education  [place].

## 2020-08-27 NOTE — Progress Notes (Signed)
Subjective:    Patient ID: Summer Young, female    DOB: [birth year], 70 y.o.   MRN: [id number]  HPI  Pt presents to the clinic today for follow up of chronic conditions.  HTN: Her BP today is 140/77. She is taking Spironolactone as prescribed. ECG from 08/2018 reviewed.  HLD: Her last LDL was 45, triglycerides 63, 03/2019. She denies myalgias on Atorvastatin. She tries to consume a low fat diet.  DM 2: Her last A1C was 7.2%, 04/2020. She is taking Metformin, Ozempic and Tresbia as prescribed. Her sugars range 88-123. She checks her feet routiunely. Flu never. Pneumovax 09/2018. Prevnar 11/2017. Covid pfizer x 3.  Osteoporosis: Bone density from 07/2019 reviewed. She is taking Fosamax as prescribed. She tries to get weight bearing exercise daily.  OA: Mainly in her back, hips and knees, managed on Tylenol and Voltaren Gel.  She does feel like her pain has been worse lately.  She describes the pain as sore and achy.  She reports some intermittent numbness but denies tingling or weakness. Xray lumbar spine:  Five lumbar vertebrae. Alignment is preserved. Lumbar vertebral body height is maintained. No definite pars interarticularis defect identified on oblique radiographs. Mild-to-moderate disc degeneration, greatest at L2-L3, L3-L4 and L5-S1. Multilevel degenerative endplate spurring, greatest at L5-S1. 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    Review of Systems     Past Medical History:  Diagnosis Date   Allergy    Chronic pain of both knees    Diabetes mellitus without complication (HCC)    Hyperlipidemia    Hypertension     Current Outpatient Medications  Medication Sig Dispense Refill   acetaminophen (TYLENOL) 500 MG tablet Take 650 mg by mouth 2 (two) times daily as needed.     alendronate (FOSAMAX) 70 MG tablet Take 1 tablet (70 mg total) by mouth once a week. Take with a full glass of water on an empty stomach. 12 tablet 1   Ascorbic Acid (VITAMIN C) 1000 MG tablet Take  1,000 mg by mouth daily as needed.      atorvastatin (LIPITOR) 40 MG tablet Take 1 tablet by mouth once daily 90 tablet 0   Biotin 1000 MCG tablet Take 4,000 mcg by mouth every other day.      blood glucose meter kit and supplies Dispense based on patient and insurance preference. Use up to four times daily as directed. (FOR ICD-10 E10.9, E11.9). 1 each 0   Cholecalciferol (VITAMIN D3) 50 MCG (2000 UT) capsule Take 1,000 Units by mouth daily.      diclofenac Sodium (VOLTAREN) 1 % GEL Apply 2 g topically 3 (three) times daily as needed. (Patient not taking: Reported on 08/06/2020) 100 g 2   insulin degludec (TRESIBA FLEXTOUCH) 100 UNIT/ML SOPN FlexTouch Pen Inject 0.26 mLs (26 Units total) into the skin daily. (Patient taking differently: Inject 20 Units into the skin daily.) 3 pen 5   metFORMIN (GLUCOPHAGE-XR) 500 MG 24 hr tablet TAKE 2 TABLETS BY MOUTH ONCE DAILY WITH BREAKFAST 180 tablet 3   ONETOUCH ULTRA test strip Check blood sugar 1 x daily as directed 100 each 12   polyethylene glycol powder (GLYCOLAX/MIRALAX) 17 GM/SCOOP powder Take 17 g by mouth 2 (two) times daily as needed. (Patient not taking: Reported on 08/06/2020) 3350 g 1   Semaglutide, 1 MG/DOSE, (OZEMPIC, 1 MG/DOSE,) 2 MG/1.5ML SOPN INJECT 1 MG SUBCUTANEOUSLY ONCE A WEEK 4 mL 0   Skin Protectants, Misc. (AMERIDERM PERISHIELD) 3.8 % OINT Apply to  skin under breast as needed 100 g 5   spironolactone (ALDACTONE) 25 MG tablet Take 1 tablet by mouth once daily 90 tablet 0   vitamin B-12 (CYANOCOBALAMIN) 1000 MCG tablet Take 1,000 mcg by mouth daily.      No current facility-administered medications for this visit.    Allergies  Allergen Reactions   Aspirin Hives    Other reaction(s): Other (See Comments) Other Reaction: Not Assessed    Insulin Detemir Hives and Rash    Errythema, edema, heat at site of injection did not improve after 2 weeks of use.     Shellfish Allergy Anaphylaxis   Dapagliflozin Other (See Comments)     Yeast infections Yeast infection Farxiga   Influenza Vaccines Other (See Comments)    Forxiga   Other     Other reaction(s): Other (See Comments) Uncoded Allergy. Allergen: Shellfish, Other Reaction: Not Assessed    Flu vaccine given 1998 caused anaphylaxis    Family History  Problem Relation Age of Onset   Diabetes Mother    Diabetes Father    Stroke Father    Diabetes Sister    Heart attack Other    Diabetes Sister    Diabetes Sister    Diabetes Sister     Social History   Socioeconomic History   Marital status: Married    Spouse name: Not on file   Number of children: Not on file   Years of education: Not on file   Highest education level: High school graduate  Occupational History   Occupation: housekeeper    Comment: self-employed  Tobacco Use   Smoking status: Never   Smokeless tobacco: Never  Vaping Use   Vaping Use: Never used  Substance and Sexual Activity   Alcohol use: Yes    Alcohol/week: 0.0 - 1.0 standard drinks    Comment: occasional   Drug use: Never   Sexual activity: Yes    Birth control/protection: None  Other Topics Concern   Not on file  Social History Narrative   Not on file   Social Determinants of Health   Financial Resource Strain: Low Risk    Difficulty of Paying Living Expenses: Not hard at all  Food Insecurity: No Food Insecurity   Worried About Charity fundraiser in the Last Year: Never true   Yellow Medicine in the Last Year: Never true  Transportation Needs: No Transportation Needs   Lack of Transportation (Medical): No   Lack of Transportation (Non-Medical): No  Physical Activity: Inactive   Days of Exercise per Week: 0 days   Minutes of Exercise per Session: 0 min  Stress: No Stress Concern Present   Feeling of Stress : Not at all  Social Connections: Not on file  Intimate Partner Violence: Not on file     Constitutional: Denies fever, malaise, fatigue, headache or abrupt weight changes.  HEENT: Denies eye pain,  eye redness, ear pain, ringing in the ears, wax buildup, runny nose, nasal congestion, bloody nose, or sore throat. Respiratory: Denies difficulty breathing, shortness of breath, cough or sputum production.   Cardiovascular: Denies chest pain, chest tightness, palpitations or swelling in the hands or feet.  Gastrointestinal: Denies abdominal pain, bloating, constipation, diarrhea or blood in the stool.  GU: Denies urgency, frequency, pain with urination, burning sensation, blood in urine, odor or discharge. Musculoskeletal: Pt reports chronic knee, hip and back pain. Denies decrease in range of motion, difficulty with gait, muscle pain or joint swelling.  Skin: Denies  redness, rashes, lesions or ulcercations.  Neurological: Denies dizziness, difficulty with memory, difficulty with speech or problems with balance and coordination.  Psych: Denies anxiety, depression, SI/HI.  No other specific complaints in a complete review of systems (except as listed in HPI above).  Objective:   Physical Exam  BP 140/77 (BP Location: Right Arm, Patient Position: Sitting, Cuff Size: Normal)   Pulse 85   Temp 97.7 F (36.5 C) (Oral)   Resp 17   Ht 5\' 2"  (1.575 m)   Wt 162 lb 6.4 oz (73.7 kg)   SpO2 100%   BMI 29.70 kg/m   Wt Readings from Last 3 Encounters:  07/09/20 161 lb 12.8 oz (73.4 kg)  06/05/20 163 lb 6.4 oz (74.1 kg)  05/16/20 162 lb 12.8 oz (73.8 kg)    General: Appears her stated age, overweight, in NAD. Skin: Warm, dry and intact. No rashes noted. HEENT: Head: normal shape and size; Eyes: sclera white and EOMs intact; Ears: Tm's gray and intact, normal light reflex;  Cardiovascular: Normal rate and rhythm. S1,S2 noted.  No murmur, rubs or gallops noted. No JVD or BLE edema. No carotid bruits noted. Pulmonary/Chest: Normal effort and positive vesicular breath sounds. No respiratory distress. No wheezes, rales or ronchi noted.  Musculoskeletal: Normal flexion and rotation of the spine.   Pain with extension and lateral bending.  Bony tenderness noted over the lumbar spine.  Normal abduction, abduction and external rotation of bilateral hips.  Pain with internal rotation of bilateral hips.  Pain with palpation of her bilateral trochanters.  Strength 4/5 LLE, 5/5 RLE.  She has no difficulty going from a sitting to standing position.  Gait slow and steady without device. Neurological: Alert and oriented.  Psychiatric: Mood and affect normal. Behavior is normal. Judgment and thought content normal.     BMET    Component Value Date/Time   NA 141 04/12/2019 0818   K 4.8 04/12/2019 0818   CL 105 04/12/2019 0818   CO2 27 04/12/2019 0818   GLUCOSE 99 04/12/2019 0818   BUN 10 04/12/2019 0818   CREATININE 0.82 04/12/2019 0818   CALCIUM 9.8 04/12/2019 0818   GFRNONAA 79 06/10/2018 0824   GFRAA 91 06/10/2018 0824    Lipid Panel     Component Value Date/Time   CHOL 104 04/12/2019 0818   TRIG 63 04/12/2019 0818   HDL 45 (L) 04/12/2019 0818   CHOLHDL 2.3 04/12/2019 0818   LDLCALC 45 04/12/2019 0818    CBC    Component Value Date/Time   WBC 8.6 04/12/2019 0818   RBC 5.05 04/12/2019 0818   HGB 13.9 04/12/2019 0818   HCT 42.4 04/12/2019 0818   PLT 357 04/12/2019 0818   MCV 84.0 04/12/2019 0818   MCH 27.5 04/12/2019 0818   MCHC 32.8 04/12/2019 0818   RDW 13.2 04/12/2019 0818   LYMPHSABS 2,967 04/12/2019 0818   EOSABS 95 04/12/2019 0818   BASOSABS 34 04/12/2019 0818    Hgb A1C Lab Results  Component Value Date   HGBA1C 7.2 (A) 04/30/2020            Assessment & Plan:   Bilateral Hip Pain:  X-ray bilateral hips today Toradol 30 mg IM x1 Rx for Flexeril 5 mg nightly as needed-sedation caution given Encourage stretching and massage Back exercises given  We will follow-up after imaging results, return precautions discussed 05/02/2020, NP  This visit occurred during the SARS-CoV-2 public health emergency.  Safety protocols were in place, including  screening questions prior to the visit, additional usage of staff PPE, and extensive cleaning of exam room while observing appropriate contact time as indicated for disinfecting solutions.

## 2020-08-28 ENCOUNTER — Encounter: Payer: Self-pay | Admitting: Internal Medicine

## 2020-08-28 DIAGNOSIS — Z6827 Body mass index (BMI) 27.0-27.9, adult: Secondary | ICD-10-CM | POA: Insufficient documentation

## 2020-08-28 DIAGNOSIS — Z6829 Body mass index (BMI) 29.0-29.9, adult: Secondary | ICD-10-CM | POA: Insufficient documentation

## 2020-08-28 DIAGNOSIS — E6609 Other obesity due to excess calories: Secondary | ICD-10-CM | POA: Insufficient documentation

## 2020-08-28 DIAGNOSIS — E663 Overweight: Secondary | ICD-10-CM | POA: Insufficient documentation

## 2020-08-28 DIAGNOSIS — E66811 Obesity, class 1: Secondary | ICD-10-CM | POA: Insufficient documentation

## 2020-08-28 LAB — COMPLETE METABOLIC PANEL WITH GFR
AG Ratio: 1.7 (calc) (ref 1.0–2.5)
ALT: 18 U/L (ref 6–29)
AST: 19 U/L (ref 10–35)
Albumin: 4.3 g/dL (ref 3.6–5.1)
Alkaline phosphatase (APISO): 116 U/L (ref 37–153)
BUN: 9 mg/dL (ref 7–25)
CO2: 26 mmol/L (ref 20–32)
Calcium: 9.6 mg/dL (ref 8.6–10.4)
Chloride: 106 mmol/L (ref 98–110)
Creat: 0.68 mg/dL (ref 0.50–1.05)
Globulin: 2.6 g/dL (calc) (ref 1.9–3.7)
Glucose, Bld: 102 mg/dL (ref 65–139)
Potassium: 4.2 mmol/L (ref 3.5–5.3)
Sodium: 140 mmol/L (ref 135–146)
Total Bilirubin: 0.3 mg/dL (ref 0.2–1.2)
Total Protein: 6.9 g/dL (ref 6.1–8.1)
eGFR: 95 mL/min/{1.73_m2} (ref >=60)

## 2020-08-28 LAB — LIPID PANEL
Cholesterol: 111 mg/dL (ref <200)
HDL: 51 mg/dL (ref >=50)
LDL Cholesterol (Calc): 41 mg/dL (calc)
Non-HDL Cholesterol (Calc): 60 mg/dL (calc) (ref <130)
Total CHOL/HDL Ratio: 2.2 (calc) (ref <5.0)
Triglycerides: 108 mg/dL (ref <150)

## 2020-08-28 LAB — HEMOGLOBIN A1C
Hgb A1c MFr Bld: 7.5 % of total Hgb — ABNORMAL HIGH (ref <5.7)
Mean Plasma Glucose: 169 mg/dL
eAG (mmol/L): 9.3 mmol/L

## 2020-08-28 NOTE — Assessment & Plan Note (Signed)
Continue Tylenol and Voltaren gel

## 2020-08-28 NOTE — Assessment & Plan Note (Signed)
Continue Fosamax as prescribed Encouraged daily weight bearing exercise

## 2020-08-28 NOTE — Assessment & Plan Note (Signed)
A1C today Will check urine microalbumin with annual exam Continue Metformin, Ozempic and Tresbia Encouraged low carb diet and exercise for weight loss Encouraged routine eye exam Encouraged routine foot exams She declines flu shot Pneumovax, Prevnar and Covid UTD

## 2020-08-28 NOTE — Assessment & Plan Note (Signed)
Controlled on Spironolactone ?Reinforced DASH diet and exercise for weight loss ?C-Met today ?

## 2020-08-28 NOTE — Assessment & Plan Note (Signed)
CMET and lipid profile today Encouraged her to consume a low fat diet Continue Atorvastatin 

## 2020-08-28 NOTE — Assessment & Plan Note (Signed)
Encouraged diet and exercise for weight loss ?

## 2020-08-28 NOTE — Assessment & Plan Note (Signed)
Continue Voltaren gel and Tylenol

## 2020-08-30 NOTE — Progress Notes (Signed)
noted 

## 2020-09-06 ENCOUNTER — Ambulatory Visit (INDEPENDENT_AMBULATORY_CARE_PROVIDER_SITE_OTHER): Payer: PPO

## 2020-09-06 ENCOUNTER — Telehealth: Payer: Self-pay | Admitting: General Practice

## 2020-09-06 DIAGNOSIS — E1165 Type 2 diabetes mellitus with hyperglycemia: Secondary | ICD-10-CM

## 2020-09-06 DIAGNOSIS — I10 Essential (primary) hypertension: Secondary | ICD-10-CM

## 2020-09-06 DIAGNOSIS — M25551 Pain in right hip: Secondary | ICD-10-CM

## 2020-09-06 DIAGNOSIS — E782 Mixed hyperlipidemia: Secondary | ICD-10-CM

## 2020-09-06 NOTE — Patient Instructions (Signed)
Visit Information  PATIENT GOALS:  Goals Addressed   None     Patient verbalizes understanding of instructions provided today and agrees to view in [place].   Telephone follow up appointment with care management team member scheduled for:12-06-2020 at 9:45am  Salvatore Marvel RN, [facility] Mobile: [phone]

## 2020-09-06 NOTE — Chronic Care Management (AMB) (Signed)
Chronic Care Management   CCM RN Visit Note  09/06/2020 Name: Summer Young MRN: [id number] DOB: 25-Sep-1950  Subjective: Summer Young is a 70 y.o. year old female who is a primary care patient of [name], NP. The care management team was consulted for assistance with disease management and care coordination needs.    Engaged with patient by telephone for follow up visit in response to provider referral for case management and/or care coordination services.   Consent to Services:  The patient was given information about Chronic Care Management services, agreed to services, and gave verbal consent prior to initiation of services.  Please see initial visit note for detailed documentation.   Patient agreed to services and verbal consent obtained.   Assessment: Review of patient past medical history, allergies, medications, health status, including review of consultants reports, laboratory and other test data, was performed as part of comprehensive evaluation and provision of chronic care management services.   SDOH (Social Determinants of Health) assessments and interventions performed:    CCM Care Plan  Allergies  Allergen Reactions   Aspirin Hives    Other reaction(s): Other (See Comments) Other Reaction: Not Assessed    Insulin Detemir Hives and Rash    Errythema, edema, heat at site of injection did not improve after 2 weeks of use.     Shellfish Allergy Anaphylaxis   Dapagliflozin Other (See Comments)    Yeast infections Yeast infection Farxiga   Influenza Vaccines Other (See Comments)    Forxiga   Other     Other reaction(s): Other (See Comments) Uncoded Allergy. Allergen: Shellfish, Other Reaction: Not Assessed    Flu vaccine given 1998 caused anaphylaxis    Outpatient Encounter Medications as of 09/06/2020  Medication Sig Note   acetaminophen (TYLENOL) 500 MG tablet Take 650 mg by mouth 2 (two) times daily as needed.    alendronate (FOSAMAX) 70 MG tablet Take 1  tablet (70 mg total) by mouth once a week. Take with a full glass of water on an empty stomach.    Ascorbic Acid (VITAMIN C) 1000 MG tablet Take 1,000 mg by mouth daily as needed.  09/20/2018: As needed to prevent illness   atorvastatin (LIPITOR) 40 MG tablet Take 1 tablet by mouth once daily    Biotin 1000 MCG tablet Take 4,000 mcg by mouth every other day.  01/19/2018: Takes every other day   blood glucose meter kit and supplies Dispense based on patient and insurance preference. Use up to four times daily as directed. (FOR ICD-10 E10.9, E11.9).    Cholecalciferol (VITAMIN D3) 50 MCG (2000 UT) capsule Take 1,000 Units by mouth daily.     cyclobenzaprine (FLEXERIL) 5 MG tablet Take 1 tablet (5 mg total) by mouth at bedtime as needed for muscle spasms.    insulin degludec (TRESIBA FLEXTOUCH) 100 UNIT/ML SOPN FlexTouch Pen Inject 0.26 mLs (26 Units total) into the skin daily. (Patient taking differently: Inject 20 Units into the skin daily.)    metFORMIN (GLUCOPHAGE-XR) 500 MG 24 hr tablet TAKE 2 TABLETS BY MOUTH ONCE DAILY WITH BREAKFAST    ONETOUCH ULTRA test strip Check blood sugar 1 x daily as directed    Semaglutide, 1 MG/DOSE, (OZEMPIC, 1 MG/DOSE,) 2 MG/1.5ML SOPN INJECT 1 MG SUBCUTANEOUSLY ONCE A WEEK    Skin Protectants, Misc. (AMERIDERM PERISHIELD) 3.8 % OINT Apply to skin under breast as needed    spironolactone (ALDACTONE) 25 MG tablet Take 1 tablet by mouth once daily    vitamin  B-12 (CYANOCOBALAMIN) 1000 MCG tablet Take 1,000 mcg by mouth daily.     No facility-administered encounter medications on file as of 09/06/2020.    Patient Active Problem List   Diagnosis Date Noted   Overweight with body mass index (BMI) of 29 to 29.9 in adult 08/28/2020   Osteoporosis 08/16/2019   Chronic bilateral low back pain without sciatica 11/22/2014   Chronic pain of both knees 11/22/2014   Essential hypertension 11/22/2014   Dyslipidemia associated with type 2 diabetes mellitus ([place]) 11/22/2014    Type 2 diabetes mellitus with hyperglycemia ([place]) 01/19/2013    Conditions to be addressed/monitored:HTN, HLD, DMII, and Chronic Back & Hip Pain  Care Plan : RNCM: Diabetes Type 2 (Adult)  Updates made by Inge Rise, RN since 09/06/2020 12:00 AM     Problem: RNCM: Management of DM   Priority: High     Long-Range Goal: RNCM: Management of DM   This Visit's Progress: On track  Priority: Medium  Note:   Objective:  Lab Results  Component Value Date   HGBA1C 7.5 (H) 08/27/2020     Lab Results  Component Value Date   CREATININE 0.68 08/27/2020   CREATININE 0.82 04/12/2019   CREATININE 0.79 06/10/2018    No results found for: EGFR Current Barriers:  Knowledge Deficits related to basic Diabetes pathophysiology and self care/management Knowledge Deficits related to medications used for management of diabetes Knowledge Deficits related to self administration of injectable diabetes medications (Ozempic) Limited Social Support Unable to independently manage DM as evidence of hemoglobin A1C or 7.4 on 01-18-2020, new on 04-30-2020 was 7.2 Lacks social connections Does not contact provider office for questions/concerns Case Manager Clinical Goal(s):  Over the next 120 days, patient will demonstrate improved adherence to prescribed treatment plan for diabetes self care/management as evidenced by:  daily monitoring and recording of CBG  adherence to ADA/ carb modified diet exercise 4/5 days/week adherence to prescribed medication regimen Interventions:  Provided education to patient about basic DM disease process Reviewed medications with patient and discussed importance of medication adherence. 06-21-2020- the patient is taking medications as ordered. No issues at this time.  Discussed plans with patient for ongoing care management follow up and provided patient with direct contact information for care management team Provided patient with written educational materials related  to hypo and hyperglycemia and importance of correct treatment. 06-21-2020: The patient denies any issue with lows at this time. Range has been 85-128. The patient is hopeful her new hemoglobin A1C will be less than 7.0 Advised patient, providing education and rationale, to check cbg BID and record, calling pcp for findings outside established parameters.  The patient states 5 days ago her blood sugars were 175-185 but they have come back down to 96 range. She is mindful of blood sugar readings.  Wants to stay on track. 06-21-2020: The patient states that he blood sugar was 85 this am. The highest it has been is 128 recently. She is monitoring her dietary intake and is working on getting back to the gym. She is going to her step-daughters wedding in [place] this weekend, reminded the patient to monitor her dietary intake. She states that she does not even like wedding cake.  Review of patient status, including review of consultants reports, relevant laboratory and other test results, and medications completed. 09/06/2020: Reviewed with patient. - barriers to adherence to treatment plan identified - blood glucose monitoring encouraged- fasting <130 and post prandial <180. 06-21-2020: Reviewed - blood glucose readings  reviewed-  06-21-2020: range over the last 2 weeks have been 85-128. 09/06/2020: Patient reports recent FBS: 88 and then other blood sugar readings in the afternoon range from 140 - 165. - mutual A1C goal set or reviewed- goal of 7.0 or less- is hopeful at 04-30-2020 appointment for <7.0. 06-21-2020: Her hemoglobin A1C on 3-14- was 7.2. The patient states she is working harder to get it down even more. 09/06/2020: Patient's recent A1C increased to 7.5 from 7.2 in March 2022.  Patient states she was on vacation with family and ate foods she enjoyed and was not strict to adhering to proper diet.  She did check her blood sugar occasionally while on vacation and made sure her blood sugar did not go over 200.   She is back to eating well balanced meals now and is more mindful of eating proper diet. - resources required to improve adherence to care identified. 06-21-2020: Continues to work with CCM team to optimize health and well being.  - self-awareness of signs/symptoms of hypo or hyperglycemia encouraged. 06-21-2020: Is aware of the sx and sx of changes and knows how to correct. Denies any highs or lows. 09/06/2020: Patient prefers not to increase Ozempic as discussed as a possibility after most recent A1C increase because she is fearful her blood sugars will go too low and she would become hypoglycemic.  Denies any signs/symptoms of hyper or hypoglycemia at this time. - use of blood glucose monitoring log promoted. Patient Goals/Self-Care Activities Over the next 120 days, patient will:  - UNABLE to independently manage DM Adheres to prescribed ADA/carb modified Follow Up Plan: Face to Face appointment with care management team member scheduled for:    12-06-2020 at 9:45 am     Care Plan : RNCM: Chronic Back  Pain (Adult)  Updates made by Leane Call, RN since 09/06/2020 12:00 AM     Problem: RNCM: Pain Management Plan (Chronic Pain)   Priority: High     Long-Range Goal: RNCM: Pain Management Plan Developed for Chroni Back pain   This Visit's Progress: On track  Priority: High  Note:   Current Barriers:  Knowledge Deficits related to management of Chronic back pain and osteoarthritis Care Coordination needs related to effective management of pain in a patient with chronic back pain and discomfort Chronic Disease Management support and education needs related to Chronic pain and discomfort  Lacks caregiver support.  Unable to independently manage pain and discomfort of chronic back pain Does not adhere to provider recommendations re: years ago the patient admits she did not complete PT on her back from a work injury and feels this significantly impacts her ability to have relief now.   Does not contact provider office for questions/concerns  Nurse Case Manager Clinical Goal(s):  Over the next 120 days, patient will verbalize understanding of plan for management of Chronic pain and discomfort  Over the next 120 days, patient will work with pcp and CCM to address needs related to ongoing chronic pain and discomfort Over the next 120 days, patient will demonstrate a decrease in pain exacerbations as evidenced by compliance with the plan of care and exploring options that will benefit the patient and help with pain relief  Over the next 120 days, patient will demonstrate improved adherence to prescribed treatment plan for chronic pain and discomfort  as evidenced bydecreased pain level  Over the next 120 days, patient will demonstrate improved health management independence as evidenced byable to perform ADLS/IADLS  Interventions:  1:1 collaboration with [name], Lupita Raider, FNP regarding development and update of comprehensive plan of care as evidenced by provider attestation and co-signature Inter-disciplinary care team collaboration (see longitudinal plan of care) Evaluation of current treatment plan related to chronic pain and discomfort and patient's adherence to plan as established by provider. 04-12-2020: The patient is not working as much now and this is helping with the back pain and discomfort. 06-21-2020: The patient states she continues to have pain but she is managing with the use of Tylenol arthritis. She is taking it every other day. She states that she was taking Meloxicam but it made her dizzy and nauseated. She says she is sticking with the Tylenol and that is working well for her. She is optimistic and has decided not to let it stop her with what she wants to do. 09/06/2020: Patient seen by PCP on 08/27/2020 for bilateral hip pain.  During office visit patient received Toradol $RemoveBeforeDE'30mg'ZeuuneOVodUWiyy$  IM injection, new order for Fexeril PRN at night and encouraged back exercises along with  stretching and massage, bilateral hip x-rays ordered.  Patient had bilateral hip x-rays done. I reviewed the x-ray results with patient which showed mild degenerative changes only, no acute injuries noted. Patient taking Tylenol 650 mg 1 tablet in AM and if she is "really hurting" she will take a second tablet in the evening. Patient did start taking Flexeril at night and now reports she is feeling "spaced out" and having difficult focusing in the mornings following taking Flexeril at night.  I explained this is most likely a side effect from the Flexeril.  She is leaning towards not taking it any longer since it has not helped a great deal.  Patient voiced how the bilateral hip pain is really impacting her ability to work at her job.   Advised patient to call the provider for changes in level or intensity of pain Provided education to patient re: alternative pain relief methods and pacing activities. 06-21-2020: Review of activities and the patient states going to the gym was helpful but she has not been in a couple of months. Education provided on the benefits of working out and strengthening back muscles. The patient will work on making it a priority to go back to the gym.  Reviewed medications with patient and discussed compliance. 06-21-2020: The patient is compliant with medications  Discussed plans with patient for ongoing care management follow up and provided patient with direct contact information for care management team Provided patient with pain relief educational materials related to pain and discomfort of chronic back pain.  - mutually acceptable comfort goal set - pain assessed - pain management plan developed - pain treatment goals reviewed - patient response to treatment assessed - careful application of heat or ice encouraged - complementary therapy use encouraged - deep breathing, relaxation and mindfulness use promoted - effectiveness of pharmacologic therapy monitored - motivation  and barriers to change assessed and addressed - mutually acceptable comfort goal set - participation in physical therapy encouraged - anxiety related to change in medications acknowledged - balance, gait and fall risk reviewed - quality of sleep assessed - safety plan developed - sleep hygiene techniques encouraged- patient endorses sleeping about 6 hours a night and feeling rested when waking  - strategies to improve or maintain safety promoted  Patient Goals/Self-Care Activities Over the next 120 days, patient will:  - Patient will self administer medications as prescribed Patient will attend all scheduled provider appointments Patient will call provider office  for new concerns or questions Patient will work with BSW to address care coordination needs and will continue to work with the clinical team to address health care and disease management related needs.    Follow Up Plan: Telephone follow up appointment with care management team member scheduled for: 12-06-2020 at 0945  am       Care Plan : RNCM: Management of Hypertension and HLD (Adult)  Updates made by Inge Rise, RN since 09/06/2020 12:00 AM     Problem: RNCM: Hypertension and HLD Management  (Hypertension)   Priority: Medium  Note:   The patient has HTN and HLD    Long-Range Goal: RNCM: Management of HTN and HLD   Expected End Date: 07/12/2021  This Visit's Progress: On track  Priority: Medium  Note:   Objective:  Last practice recorded BP readings:  BP Readings from Last 3 Encounters:  08/27/20 140/77  07/09/20 122/70  06/05/20 122/70    Lab Results  Component Value Date   CHOL 111 08/27/2020   HDL 51 08/27/2020   LDLCALC 41 08/27/2020   TRIG 108 08/27/2020   CHOLHDL 2.2 08/27/2020    No results found for: LDLDIRECT    Takes Lipitor 40 mg QD  Most recent eGFR/CrCl: No results found for: EGFR  No components found for: CRCL Current Barriers:  Knowledge Deficits related to basic  understanding of hypertension and HLD pathophysiology and self care management Knowledge Deficits related to understanding of medications prescribed for management of hypertension and HLD Unable to independently manage chronic conditions of HTN and HLD Does not contact provider office for questions/concerns Case Manager Clinical Goal(s):  Over the next 120 days, patient will verbalize understanding of plan for hypertension management Over the next 120 days, patient will attend all scheduled medical appointments: saw pcp on 05-16-2020. Knows to call for changes in condition Over the next 120 days, patient will demonstrate improved adherence to prescribed treatment plan for hypertension as evidenced by taking all medications as prescribed, monitoring and recording blood pressure as directed, adhering to low sodium/DASH diet Over the next 120 days, patient will demonstrate improved health management independence as evidenced by checking blood pressure as directed and notifying PCP if SBP>160 or DBP > 90, taking all medications as prescribe, and adhering to a low sodium diet as discussed. Interventions:  Evaluation of current treatment plan related to hypertension self management and patient's adherence to plan as established by provider. 06-21-2020: The patient is compliant with heart healthy diet, is working on going back to the gym to help with improving chronic conditions. Review of heart healthy/ADA restrictions as the patient is going out of town this weekend for a wedding. 09/06/2020:  Patient enjoyed family vacation and did not adhere to regular diet during the vacation.  Now she is back to her normal eating routine. Provided education to patient re: stroke prevention, s/s of heart attack and stroke, DASH diet, complications of uncontrolled blood pressure Reviewed medications with patient and discussed importance of compliance. 06-21-2020: The patient is compliant with medications regimen.  Discussed  plans with patient for ongoing care management follow up and provided patient with direct contact information for care management team Advised patient, providing education and rationale, to monitor blood pressure daily and record, calling PCP for findings outside established parameters.  Review of recent sinus/cold congestion. Took COVID test and the test was negative. 06-21-2020: Denies any issues related to cold and sinus issues. States she is still using her mask.  - blood  pressure trends reviewed - depression screen reviewed - home or ambulatory blood pressure monitoring encouraged - healthy diet promoted - healthy family lifestyle promoted - medication side effects managed - pain assessed and managed - patient response to treatment assessed - quality of sleep assessed - reduction of dietary sodium encouraged - reduction in sedentary activities encouraged - response to pharmacologic therapy monitored - sleep hygiene techniques encouraged Patient Goals/Self-Care Activities Over the next 120 days, patient will:  - Calls provider office for new concerns, questions, or BP outside discussed parameters Checks BP and records as discussed Follows a low sodium diet/DASH diet Follow Up Plan: Telephone follow up appointment with care management team member scheduled for:  12-06-2020 at 0945 am     Plan:Telephone follow up appointment with care management team member scheduled for:  12-06-2020 at 9:45am  Salvatore Marvel RN, [place] Mobile: [phone]

## 2020-09-19 ENCOUNTER — Ambulatory Visit (INDEPENDENT_AMBULATORY_CARE_PROVIDER_SITE_OTHER): Payer: PPO | Admitting: Pharmacist

## 2020-09-19 DIAGNOSIS — E1165 Type 2 diabetes mellitus with hyperglycemia: Secondary | ICD-10-CM

## 2020-09-19 DIAGNOSIS — E782 Mixed hyperlipidemia: Secondary | ICD-10-CM

## 2020-09-19 NOTE — Chronic Care Management (AMB) (Signed)
  Chronic Care Management Pharmacy Note  09/19/2020 Name:  Tamari Emery MRN:  [id number] DOB:  04/23/1950   Subjective: Summer Young is an 70 y.o. year old female who is a primary patient of Baity, Regina W, NP.  The CCM team was consulted for assistance with disease management and care coordination needs.    Engaged with patient by telephone for follow up visit in response to provider referral for pharmacy case management and/or care coordination services.   Consent to Services:  The patient was given information about Chronic Care Management services, agreed to services, and gave verbal consent prior to initiation of services.  Please see initial visit note for detailed documentation.   Patient Care Team: Baity, Regina W, NP as PCP - General (Internal Medicine) Dhalla, Elisabeth A, RPH-CPP as Pharmacist Tate, Pamela J, RN as Case Manager (General Practice) Lewis, Jasmine D, LCSW as Social Worker (Licensed Clinical Social Worker)  Recent office visits: Office Visit with PCP on 7/11 for follow up   Hospital visits: None in previous 6 months  Objective:  Lab Results  Component Value Date   CREATININE 0.68 08/27/2020   CREATININE 0.82 04/12/2019   CREATININE 0.79 06/10/2018    Lab Results  Component Value Date   HGBA1C 7.5 (H) 08/27/2020   Last diabetic Eye exam:  Lab Results  Component Value Date/Time   HMDIABEYEEXA No Retinopathy 12/27/2018 12:00 AM    Last diabetic Foot exam: No results found for: HMDIABFOOTEX      Component Value Date/Time   CHOL 111 08/27/2020 1533   TRIG 108 08/27/2020 1533   HDL 51 08/27/2020 1533   CHOLHDL 2.2 08/27/2020 1533   LDLCALC 41 08/27/2020 1533    Hepatic Function Latest Ref Rng & Units 08/27/2020 04/12/2019 06/10/2018  Total Protein 6.1 - 8.1 g/dL 6.9 7.2 6.7  AST 10 - 35 U/L 19 18 18  ALT 6 - 29 U/L 18 17 20  Total Bilirubin 0.2 - 1.2 mg/dL 0.3 0.5 0.5     Social History   Tobacco Use  Smoking Status Never  Smokeless  Tobacco Never   BP Readings from Last 3 Encounters:  08/27/20 140/77  07/09/20 122/70  06/05/20 122/70   Pulse Readings from Last 3 Encounters:  08/27/20 85  05/16/20 93  04/30/20 86   Wt Readings from Last 3 Encounters:  08/27/20 162 lb 6.4 oz (73.7 kg)  07/09/20 161 lb 12.8 oz (73.4 kg)  06/05/20 163 lb 6.4 oz (74.1 kg)    Assessment: Review of patient past medical history, allergies, medications, health status, including review of consultants reports, laboratory and other test data, was performed as part of comprehensive evaluation and provision of chronic care management services.   SDOH:  (Social Determinants of Health) assessments and interventions performed: yes SDOH Interventions    Flowsheet Row Most Recent Value  SDOH Interventions   SDOH Interventions for the Following Domains Physical Activity  Physical Activity Interventions Other (Comments)  [Encourage patient to continue exercise regimen]       CCM Care Plan  Allergies  Allergen Reactions   Aspirin Hives    Other reaction(s): Other (See Comments) Other Reaction: Not Assessed    Insulin Detemir Hives and Rash    Errythema, edema, heat at site of injection did not improve after 2 weeks of use.     Shellfish Allergy Anaphylaxis   Dapagliflozin Other (See Comments)    Yeast infections Yeast infection Farxiga   Influenza Vaccines Other (See Comments)      Dulce Sellar   Other     Other reaction(s): Other (See Comments) Uncoded Allergy. Allergen: Shellfish, Other Reaction: Not Assessed    Flu vaccine given 1998 caused anaphylaxis    Medications Reviewed Today     Reviewed by Vella Raring, RPH-CPP (Pharmacist) on 09/19/20 at [place] List Status: <None>   Medication Order Taking? Sig Documenting Provider Last Dose Status Informant  acetaminophen (TYLENOL) 500 MG tablet [id number] Yes Take 650 mg by mouth 2 (two) times daily as needed. [provider] Taking Active   alendronate (FOSAMAX) 70  MG tablet [id number] Yes Take 1 tablet (70 mg total) by mouth once a week. Take with a full glass of water on an empty stomach. Olin Hauser, DO Taking Active   Ascorbic Acid (VITAMIN C) 1000 MG tablet [id number] Yes Take 1,000 mg by mouth daily as needed.  [provider] Taking Active            Med Note Winfield Cunas, [facility]   Mon Sep 20, 2018  4:05 PM) As needed to prevent illness  atorvastatin (LIPITOR) 40 MG tablet [id number] Yes Take 1 tablet by mouth once daily Olin Hauser, DO Taking Active   Biotin 1000 MCG tablet [id number]  Take 4,000 mcg by mouth every other day.  [provider]  Active Self           Med Note Barrington Ellison   Tue Jan 19, 2018 11:31 AM) [name] every other day  blood glucose meter kit and supplies [id number]  Dispense based on patient and insurance preference. Use up to four times daily as directed. (FOR ICD-10 E10.9, E11.9). [name], FNP  Active   Cholecalciferol (VITAMIN D3) 50 MCG (2000 UT) capsule [id number] Yes Take 1,000 Units by mouth daily.  [provider] Taking Active   insulin degludec (TRESIBA FLEXTOUCH) 100 UNIT/ML SOPN FlexTouch Pen [id number] Yes Inject 0.26 mLs (26 Units total) into the skin daily.  Patient taking differently: Inject 20 Units into the skin daily.   [name], NP Taking Active   metFORMIN (GLUCOPHAGE-XR) 500 MG 24 hr tablet [id number] Yes TAKE 2 TABLETS BY MOUTH ONCE DAILY WITH BREAKFAST Olin Hauser, DO Taking Active   [facility] ULTRA test strip [id number]  Check blood sugar 1 x daily as directed Olin Hauser, DO  Active   polyethylene glycol (MIRALAX / GLYCOLAX) 17 g packet [id number] Yes Take 17 g by mouth daily as needed. [provider] Taking Active   Semaglutide, 1 MG/DOSE, (OZEMPIC, 1 MG/DOSE,) 2 MG/1.5ML SOPN [id number] Yes INJECT 1 MG SUBCUTANEOUSLY ONCE A WEEK Karamalegos, Devonne Doughty, DO Taking Active   Skin Protectants, Misc. (AMERIDERM  PERISHIELD) 3.8 % OINT [id number]  Apply to skin under breast as needed Olin Hauser, DO  Active   spironolactone (ALDACTONE) 25 MG tablet [id number] Yes Take 1 tablet by mouth once daily Olin Hauser, DO Taking Active   vitamin B-12 (CYANOCOBALAMIN) 1000 MCG tablet [id number] Yes Take 1,000 mcg by mouth daily.  [provider] Taking Active             Patient Active Problem List   Diagnosis Date Noted   Overweight with body mass index (BMI) of 29 to 29.9 in adult 08/28/2020   Osteoporosis 08/16/2019   Chronic bilateral low back pain without sciatica 11/22/2014   Chronic pain of both knees 11/22/2014   Essential hypertension 11/22/2014   Dyslipidemia associated with type 2 diabetes mellitus ([place]) 11/22/2014  Type 2 diabetes mellitus with hyperglycemia (HCC) 01/19/2013    Immunization History  Administered Date(s) Administered   PFIZER(Purple Top)SARS-COV-2 Vaccination 04/21/2019, 05/12/2019, 12/17/2019   Pneumococcal Conjugate-13 11/18/2017   Pneumococcal Polysaccharide-23 09/20/2008   Tdap 04/23/2007, 07/16/2017   Zoster, Live 11/12/2015    Conditions to be addressed/monitored: HTN, HLD, and DMII  Care Plan : General Pharmacy (Adult)  Updates made by Dhalla, Elisabeth A, RPH-CPP since 09/19/2020 12:00 AM     Problem: Disease Progression      Long-Range Goal: Disease Progression Prevented or Minimized   Start Date: 01/23/2020  Expected End Date: 04/22/2020  This Visit's Progress: On track  Recent Progress: On track  Priority: High  Note:   Current Barriers:  Unable to independently afford treatment regimen Patient APPROVED for Tresiba and Ozempic medication assistance from [employer] for 2022 calendar year  Pharmacist Clinical Goal(s):  Over the next 90 days, patient will achieve control of T2DM as evidenced by A1C <7.0%. through collaboration with PharmD and provider.  Over the next 90 days, patient will maintain control of LDL as  evidenced by LDL <70 mg/dL through collaboration with PharmD and provider.  Over the next 90 days, patient will verbalize ability to afford treatment regimen. through collaboration with PharmD and provider.  Interventions: Inter-disciplinary care team collaboration (see longitudinal plan of care) 1:1 collaboration with Karamalegos, Alexander J, DO regarding development and update of comprehensive plan of care as evidenced by provider attestation and co-signature Perform chart review Patient seen for Office Visit with PCP on 7/11 for follow up Patient received IM Toradol injection and bilateral hip x-ray completed Prescribed Flexeril 5 mg nightly as needed A1C increased to 7.5% Comprehensive medication review performed; medication list updated in electronic medical record Reports she has recently had some constipation, but that this is controlled with Miralax as needed.  Encourage patient to stay hydrated  Provide patient with phone number for CCM Social Worker as requested. Reports wants to ask Jasmine for help with finding a bilingual marriage counselor  Diabetes: Latest A1C elevated; current treatment: Tresiba 20 units once daily Metformin ER 500 mg - 2 tablets daily with breakfast Ozempic 1 mg once weekly Reports recent fasting blood sugars ranging 90s-100s; this morning: 98 Reports blood sugar improved now that she is back to her reguar diet at home Denies hypoglycemia Note patient carries glucose tablets with her Attributes recent elevated A1C to increased appetite while back in [place], enjoying eating more carbohydrates (spicy food and fruit) Counsel on option to increase Ozempic dose. Patient denies interest at this time as feels like her blood sugars are now back down Discuss importance of having well-balanced meals throughout the day and limiting carbohydrate portion sizes   Lower Back Pain: Reports Toradol injection received from PCP helped with her hip pain, but  she continued to have stiffness  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 Congratulate patient on having returned to the gym and walks ~20 minutes and then has massage every other day before work Reports pain currently controlled with taking acetaminiphen as needed and massages Reports stopped taking Flexeril due to feeling disoriented/hungover next day  Hyperlipidemia: Controlled; current treatment: atorvastatin 40 mg daily  Have counseled patient on importance of medication adherence Have discussed dietary management  Medication Assistance Reports just received a supply of Tresiba and Ozempic from [employer] patient assistance program     Patient Goals/Self-Care Activities Over the next 90 days, patient will:  Take medications as prescribed Check blood   glucose daily, document, and provide at future appointments Keeps log in App on phone Collaborate with provider on medication access solutions Attends all scheduled provider appointments Calls pharmacy for medication refills Calls provider office for new concerns or questions Follow Up Plan: Telephone follow up appointment with care management team member scheduled for: 10/22/2020 at 8:30 AM     Medication Assistance: Patient APPROVED for [name] and Ozempic medication assistance from [employer] for 2022 calendar year  Patient's preferred pharmacy is:  [facility], [place] - [address] [place] [address] Phone: [phone] Fax: [phone]   Follow Up:  Patient agrees to Care Plan and Follow-up.  Wallace Cullens, PharmD, Para [place], CPP Clinical Pharmacist [facility] [phone]

## 2020-09-19 NOTE — Patient Instructions (Signed)
Visit Information  PATIENT GOALS:  Goals Addressed             This Visit's Progress    Pharmacy Goals       Our goal A1c is less than 7%. This corresponds with fasting sugars less than 130 and 2 hour after meal sugars less than 180. Please check your blood sugars and keep log of results  Our goal bad cholesterol, or LDL, is less than 70 . This is why it is important to continue taking your atorvastatin  Please check your home blood pressure, keep a log of the results and bring this with you to your medical appointments.  Feel free to call me with any questions or concerns. I look forward to our next call!   Wallace Cullens, PharmD, [place] [phone]         The patient verbalized understanding of instructions, educational materials, and care plan provided today and declined offer to receive copy of patient instructions, educational materials, and care plan.   Telephone follow up appointment with care management team member scheduled for: 10/22/2020 at 8:30 AM

## 2020-09-20 ENCOUNTER — Ambulatory Visit: Payer: Self-pay | Admitting: Licensed Clinical Social Worker

## 2020-09-20 DIAGNOSIS — Z7189 Other specified counseling: Secondary | ICD-10-CM

## 2020-09-20 DIAGNOSIS — E1165 Type 2 diabetes mellitus with hyperglycemia: Secondary | ICD-10-CM

## 2020-09-20 DIAGNOSIS — I10 Essential (primary) hypertension: Secondary | ICD-10-CM

## 2020-09-20 DIAGNOSIS — G8929 Other chronic pain: Secondary | ICD-10-CM

## 2020-09-20 DIAGNOSIS — M545 Low back pain, unspecified: Secondary | ICD-10-CM

## 2020-09-24 ENCOUNTER — Other Ambulatory Visit: Payer: Self-pay

## 2020-09-24 DIAGNOSIS — M810 Age-related osteoporosis without current pathological fracture: Secondary | ICD-10-CM

## 2020-09-24 MED ORDER — ALENDRONATE SODIUM 70 MG PO TABS: 70.0000 mg | ORAL_TABLET | ORAL | 1 refills | Status: DC

## 2020-09-25 NOTE — Chronic Care Management (AMB) (Signed)
Chronic Care Management    Clinical Social Work Note  09/25/2020 Name: Summer Young MRN: [id number] DOB: [birth year]  Summer Young is a 70 y.o. year old female who is a primary care patient of Summer Fenton, NP. The CCM team was consulted to assist the patient with chronic disease management and/or care coordination needs related to: Mental Health Counseling and Resources.   Engaged with patient by telephone for follow up visit in response to provider referral for social work chronic care management and care coordination services.   Consent to Services:  The patient was given information about Chronic Care Management services, agreed to services, and gave verbal consent prior to initiation of services.  Please see initial visit note for detailed documentation.   Patient agreed to services and consent obtained.   Assessment: Patient is engaged in conversation, continues to maintain positive progress with care plan goals. She continues to receive support from family and friends. Patient is open to counseling to assist with strain in marriage. CCM LCSW will complete referral. See Care Plan below for interventions and patient self-care actives.  Recent life changes Summer Young: Relationship concerns  Recommendation: Patient may benefit from, and is in agreement to work with LCSW to address care coordination needs and will continue to work with the clinical team to address health care and disease management related needs  Follow up Plan: Patient would like continued follow-up from CCM LCSW .  Follow up scheduled in 10/02/20. Patient will call office if needed prior to next encounter.  SDOH (Social Determinants of Health) assessments and interventions performed:    Advanced Directives Status: Not addressed in this encounter.  CCM Care Plan  Allergies  Allergen Reactions   Aspirin Hives    Other reaction(s): Other (See Comments) Other Reaction: Not Assessed    Insulin Detemir Hives and  Rash    Errythema, edema, heat at site of injection did not improve after 2 weeks of use.     Shellfish Allergy Anaphylaxis   Dapagliflozin Other (See Comments)    Yeast infections Yeast infection Farxiga   Influenza Vaccines Other (See Comments)    Forxiga   Other     Other reaction(s): Other (See Comments) Uncoded Allergy. Allergen: Shellfish, Other Reaction: Not Assessed    Flu vaccine given 1998 caused anaphylaxis    Outpatient Encounter Medications as of 09/20/2020  Medication Sig Note   acetaminophen (TYLENOL) 500 MG tablet Take 650 mg by mouth 2 (two) times daily as needed.    Ascorbic Acid (VITAMIN C) 1000 MG tablet Take 1,000 mg by mouth daily as needed.  09/20/2018: As needed to prevent illness   atorvastatin (LIPITOR) 40 MG tablet Take 1 tablet by mouth once daily    Biotin 1000 MCG tablet Take 4,000 mcg by mouth every other day.  01/19/2018: Takes every other day   blood glucose meter kit and supplies Dispense based on patient and insurance preference. Use up to four times daily as directed. (FOR ICD-10 E10.9, E11.9).    Cholecalciferol (VITAMIN D3) 50 MCG (2000 UT) capsule Take 1,000 Units by mouth daily.     insulin degludec (TRESIBA FLEXTOUCH) 100 UNIT/ML SOPN FlexTouch Pen Inject 0.26 mLs (26 Units total) into the skin daily. (Patient taking differently: Inject 20 Units into the skin daily.)    metFORMIN (GLUCOPHAGE-XR) 500 MG 24 hr tablet TAKE 2 TABLETS BY MOUTH ONCE DAILY WITH BREAKFAST    ONETOUCH ULTRA test strip Check blood sugar 1 x daily as directed  polyethylene glycol (MIRALAX / GLYCOLAX) 17 g packet Take 17 g by mouth daily as needed.    Semaglutide, 1 MG/DOSE, (OZEMPIC, 1 MG/DOSE,) 2 MG/1.5ML SOPN INJECT 1 MG SUBCUTANEOUSLY ONCE A WEEK    Skin Protectants, Misc. (AMERIDERM PERISHIELD) 3.8 % OINT Apply to skin under breast as needed    spironolactone (ALDACTONE) 25 MG tablet Take 1 tablet by mouth once daily    vitamin B-12 (CYANOCOBALAMIN) 1000 MCG tablet Take  1,000 mcg by mouth daily.     [DISCONTINUED] alendronate (FOSAMAX) 70 MG tablet Take 1 tablet (70 mg total) by mouth once a week. Take with a full glass of water on an empty stomach.    No facility-administered encounter medications on file as of 09/20/2020.    Patient Active Problem List   Diagnosis Date Noted   Overweight with body mass index (BMI) of 29 to 29.9 in adult 08/28/2020   Osteoporosis 08/16/2019   Chronic bilateral low back pain without sciatica 11/22/2014   Chronic pain of both knees 11/22/2014   Essential hypertension 11/22/2014   Dyslipidemia associated with type 2 diabetes mellitus ([place]) 11/22/2014   Type 2 diabetes mellitus with hyperglycemia ([place]) 01/19/2013    Conditions to be addressed/monitored:  Stress ; Family and relationship dysfunction  Care Plan : General Social Work (Adult)  Updates made by Christa See D, LCSW since 09/25/2020 12:00 AM     Problem: Depression Identification (Depression)      Long-Range Goal: Depressive Symptoms Identified   Start Date: 01/24/2020  This Visit's Progress: On track  Recent Progress: On track  Priority: Medium  Note:   Timeframe:  Long-Range Goal Priority:  Medium  Start Date:    03/28/20                      Expected End Date:   10/17/20               Follow Up Date 10/02/20   Current Barriers:  Acute Mental Health needs related to anxiety/stress Mental Health Concerns  Suicidal Ideation/Homicidal Ideation: No Clinical Social Work Goal(s):  Over the next 120 days, patient will work with SW bi-monthly by telephone or in person to reduce or manage symptoms related to anxiety Over the next 120 days, patient will work with SW to address concerns related to gaining mental health support and develop better coping skills to manage symptoms Interventions: Patient interviewed and appropriate assessments performed Pt reports that she is managing symptoms of depression and anxiety well. Insomnia symptoms have  resolved Patient reports strain in marriage and is interested in therapy (Bilingual counselor requested) Patient prefers in person sessions CCM LCSW provided support and encouragement. Strategies to assist with communication with spouse discussed. Patient provided verbal consent for CCM LCSW to complete referral to therapy Patient was successful in identifying healthy coping skills. She has returned to the gym and continues to meet with friends Pt reports that she is currently managing back pain from arthritis with Tylenol. Feels it has been more effective then prescribed medicine  CCM LCSW discussed plans with patient for ongoing care management follow up and provided patient with direct contact information for care management team CCM LCSW advised patient to contact CCM program for any urgent case management needs Solution-Focused Strategies, Active listening / Reflection utilized , Emotional Supportive Provided, Participation in counseling encouraged , and Verbalization of feelings encouraged  1:1 collaboration with primary care provider regarding development and update of comprehensive plan of care as evidenced  by provider attestation and co-signature Patient Self Care Activities:  Attend all scheduled provider appointments Continue utilizing healthy coping skills and strategies on communication Contact clinic with any questions or concerns          Christa See, MSW, [email or web address] Phone [phone] 8:36 AM

## 2020-09-25 NOTE — Patient Instructions (Signed)
Visit Information   Goals Addressed             This Visit's Progress    Track and Manage My Symptoms-Depression   On track    Timeframe:  Long-Range Goal Priority:  Medium  Start Date:    03/28/20                      Expected End Date:   12/17/20               Follow Up Date -10/02/20   Patient Self Care Activities:  Attend all scheduled provider appointments Continue utilizing healthy coping skills and strategies on communication Contact clinic with any questions or concerns        Patient verbalizes understanding of instructions provided today and agrees to view in [place].   Telephone follow up appointment with care management team member scheduled for:10/02/20  Christa See, MSW, [email or web address] Phone [phone] 8:39 AM

## 2020-10-02 ENCOUNTER — Ambulatory Visit: Payer: PPO | Admitting: Licensed Clinical Social Worker

## 2020-10-02 DIAGNOSIS — G8929 Other chronic pain: Secondary | ICD-10-CM

## 2020-10-02 DIAGNOSIS — F419 Anxiety disorder, unspecified: Secondary | ICD-10-CM

## 2020-10-02 DIAGNOSIS — E1165 Type 2 diabetes mellitus with hyperglycemia: Secondary | ICD-10-CM

## 2020-10-02 DIAGNOSIS — M25562 Pain in left knee: Secondary | ICD-10-CM

## 2020-10-02 DIAGNOSIS — M545 Low back pain, unspecified: Secondary | ICD-10-CM

## 2020-10-05 NOTE — Patient Instructions (Signed)
Visit Information   Goals Addressed             This Visit's Progress    Track and Manage My Symptoms-Depression   On track    Timeframe:  Long-Range Goal Priority:  Medium  Start Date:    03/28/20                      Expected End Date:   12/17/20               Follow Up Date -10/30/20   Patient Self Care Activities:  Attend all scheduled provider appointments Continue utilizing healthy coping skills and strategies on communication Contact clinic with any questions or concerns        Patient verbalizes understanding of instructions provided today and agrees to view in [place].   Telephone follow up appointment with care management team member scheduled for:10/30/20  Christa See, MSW, [email or web address] Phone [phone] 11:56 PM

## 2020-10-05 NOTE — Chronic Care Management (AMB) (Signed)
Chronic Care Management    Clinical Social Work Note  10/05/2020 Name: Summer Young MRN: [id number] DOB: 05-11-1950  Summer Young is a 70 y.o. year old female who is a primary care patient of [name], NP. The CCM team was consulted to assist the patient with chronic disease management and/or care coordination needs related to: Mental Health Counseling and Resources.   Engaged with patient by telephone for follow up visit in response to provider referral for social work chronic care management and care coordination services.   Consent to Services:  The patient was given information about Chronic Care Management services, agreed to services, and gave verbal consent prior to initiation of services.  Please see initial visit note for detailed documentation.   Patient agreed to services and consent obtained.   Assessment: Review of patient past medical history, allergies, medications, and health status, including review of relevant consultants reports was performed today as part of a comprehensive evaluation and provision of chronic care management and care coordination services.     SDOH (Social Determinants of Health) assessments and interventions performed:    Advanced Directives Status: Not addressed in this encounter.  CCM Care Plan  Allergies  Allergen Reactions   Aspirin Hives    Other reaction(s): Other (See Comments) Other Reaction: Not Assessed    Insulin Detemir Hives and Rash    Errythema, edema, heat at site of injection did not improve after 2 weeks of use.     Shellfish Allergy Anaphylaxis   Dapagliflozin Other (See Comments)    Yeast infections Yeast infection Farxiga   Influenza Vaccines Other (See Comments)    Forxiga   Other     Other reaction(s): Other (See Comments) Uncoded Allergy. Allergen: Shellfish, Other Reaction: Not Assessed    Flu vaccine given 1998 caused anaphylaxis    Outpatient Encounter Medications as of 10/02/2020  Medication Sig Note    acetaminophen (TYLENOL) 500 MG tablet Take 650 mg by mouth 2 (two) times daily as needed.    alendronate (FOSAMAX) 70 MG tablet Take 1 tablet (70 mg total) by mouth once a week. Take with a full glass of water on an empty stomach.    Ascorbic Acid (VITAMIN C) 1000 MG tablet Take 1,000 mg by mouth daily as needed.  09/20/2018: As needed to prevent illness   atorvastatin (LIPITOR) 40 MG tablet Take 1 tablet by mouth once daily    Biotin 1000 MCG tablet Take 4,000 mcg by mouth every other day.  01/19/2018: Takes every other day   blood glucose meter kit and supplies Dispense based on patient and insurance preference. Use up to four times daily as directed. (FOR ICD-10 E10.9, E11.9).    Cholecalciferol (VITAMIN D3) 50 MCG (2000 UT) capsule Take 1,000 Units by mouth daily.     insulin degludec (TRESIBA FLEXTOUCH) 100 UNIT/ML SOPN FlexTouch Pen Inject 0.26 mLs (26 Units total) into the skin daily. (Patient taking differently: Inject 20 Units into the skin daily.)    metFORMIN (GLUCOPHAGE-XR) 500 MG 24 hr tablet TAKE 2 TABLETS BY MOUTH ONCE DAILY WITH BREAKFAST    ONETOUCH ULTRA test strip Check blood sugar 1 x daily as directed    polyethylene glycol (MIRALAX / GLYCOLAX) 17 g packet Take 17 g by mouth daily as needed.    Semaglutide, 1 MG/DOSE, (OZEMPIC, 1 MG/DOSE,) 2 MG/1.5ML SOPN INJECT 1 MG SUBCUTANEOUSLY ONCE A WEEK    Skin Protectants, Misc. (AMERIDERM PERISHIELD) 3.8 % OINT Apply to skin under breast as needed  spironolactone (ALDACTONE) 25 MG tablet Take 1 tablet by mouth once daily    vitamin B-12 (CYANOCOBALAMIN) 1000 MCG tablet Take 1,000 mcg by mouth daily.     No facility-administered encounter medications on file as of 10/02/2020.    Patient Active Problem List   Diagnosis Date Noted   Overweight with body mass index (BMI) of 29 to 29.9 in adult 08/28/2020   Osteoporosis 08/16/2019   Chronic bilateral low back pain without sciatica 11/22/2014   Chronic pain of both knees 11/22/2014    Essential hypertension 11/22/2014   Dyslipidemia associated with type 2 diabetes mellitus ([place]) 11/22/2014   Type 2 diabetes mellitus with hyperglycemia ([place]) 01/19/2013    Conditions to be addressed/monitored: Anxiety; Mental Health Concerns  and Family and relationship dysfunction  Care Plan : General Social Work (Adult)  Updates made by Christa See D, LCSW since 10/05/2020 12:00 AM     Problem: Depression Identification (Depression)      Long-Range Goal: Depressive Symptoms Identified   Start Date: 01/24/2020  This Visit's Progress: On track  Recent Progress: On track  Priority: Medium  Note:   Timeframe:  Long-Range Goal Priority:  Medium  Start Date:    03/28/20                      Expected End Date:   10/17/20               Follow Up Date 10/30/20   Current Barriers:  Acute Mental Health needs related to anxiety/stress Mental Health Concerns  Suicidal Ideation/Homicidal Ideation: No Clinical Social Work Goal(s):  Over the next 120 days, patient will work with SW bi-monthly by telephone or in person to reduce or manage symptoms related to anxiety Over the next 120 days, patient will work with SW to address concerns related to gaining mental health support and develop better coping skills to manage symptoms Interventions: Patient interviewed and appropriate assessments performed Pt reports that she is managing symptoms of depression and anxiety well. Insomnia symptoms have resolved Patient reports strain in marriage and is interested in therapy (Bilingual counselor requested) Patient prefers in person sessions 08/16: CCM LCSW informed patient of update regarding referral. CCM LCSW is awaiting a response from multiple local agencies regarding their ability to accept patient insurance, in addition, to having bilingual therapist availability CCM LCSW provided support and encouragement. Strategies to assist with communication with spouse discussed. Patient provided verbal  consent for CCM LCSW to complete referral to therapy 08/16: CCM LCSW discussed strategies to assist in management of stress Patient was successful in identifying healthy coping skills. She has returned to the gym and continues to meet with friends Pt reports that she is currently managing back pain from arthritis with Tylenol. Feels it has been more effective then prescribed medicine  CCM LCSW discussed plans with patient for ongoing care management follow up and provided patient with direct contact information for care management team CCM LCSW advised patient to contact CCM program for any urgent case management needs Solution-Focused Strategies, Active listening / Reflection utilized , Emotional Supportive Provided, Participation in counseling encouraged , and Verbalization of feelings encouraged  1:1 collaboration with primary care provider regarding development and update of comprehensive plan of care as evidenced by provider attestation and co-signature Patient Self Care Activities:  Attend all scheduled provider appointments Continue utilizing healthy coping skills and strategies on communication Contact clinic with any questions or concerns  Christa See, MSW, [facility] Care Management [email or web address] Phone [phone] 11:55 PM

## 2020-10-15 DIAGNOSIS — E119 Type 2 diabetes mellitus without complications: Secondary | ICD-10-CM | POA: Diagnosis not present

## 2020-10-15 LAB — HM DIABETES EYE EXAM

## 2020-10-17 DIAGNOSIS — I10 Essential (primary) hypertension: Secondary | ICD-10-CM

## 2020-10-17 DIAGNOSIS — E782 Mixed hyperlipidemia: Secondary | ICD-10-CM | POA: Diagnosis not present

## 2020-10-17 DIAGNOSIS — E1165 Type 2 diabetes mellitus with hyperglycemia: Secondary | ICD-10-CM

## 2020-10-22 ENCOUNTER — Telehealth: Payer: Self-pay

## 2020-10-29 ENCOUNTER — Ambulatory Visit (INDEPENDENT_AMBULATORY_CARE_PROVIDER_SITE_OTHER): Payer: PPO | Admitting: Pharmacist

## 2020-10-29 ENCOUNTER — Other Ambulatory Visit: Payer: Self-pay | Admitting: Family Medicine

## 2020-10-29 DIAGNOSIS — I10 Essential (primary) hypertension: Secondary | ICD-10-CM

## 2020-10-29 DIAGNOSIS — E782 Mixed hyperlipidemia: Secondary | ICD-10-CM

## 2020-10-29 DIAGNOSIS — E1165 Type 2 diabetes mellitus with hyperglycemia: Secondary | ICD-10-CM

## 2020-10-29 NOTE — Patient Instructions (Signed)
Visit Information  PATIENT GOALS:  Goals Addressed             This Visit's Progress    Pharmacy Goals       Our goal A1c is less than 7%. This corresponds with fasting sugars less than 130 and 2 hour after meal sugars less than 180. Please check your blood sugars and keep log of results  Our goal bad cholesterol, or LDL, is less than 70 . This is why it is important to continue taking your atorvastatin  Please check your home blood pressure, keep a log of the results and bring this with you to your medical appointments.  Feel free to call me with any questions or concerns. I look forward to our next call!  Wallace Cullens, PharmD, [place] [phone]         The patient verbalized understanding of instructions, educational materials, and care plan provided today and declined offer to receive copy of patient instructions, educational materials, and care plan.   Telephone follow up appointment with care management team member scheduled for: 12/24/2020 at 8:30 AM

## 2020-10-29 NOTE — Telephone Encounter (Signed)
Valid encounter. Future visit in 1 month  

## 2020-10-29 NOTE — Chronic Care Management (AMB) (Signed)
Chronic Care Management Pharmacy Note  10/29/2020 Name:  Summer Young MRN:  [id number] DOB:  09-Sep-1950  Subjective: Summer Young is an 70 y.o. year old female who is a primary patient of [name], NP.  The CCM team was consulted for assistance with disease management and care coordination needs.    Engaged with patient by telephone for follow up visit in response to provider referral for pharmacy case management and/or care coordination services.   Consent to Services:  The patient was given information about Chronic Care Management services, agreed to services, and gave verbal consent prior to initiation of services.  Please see initial visit note for detailed documentation.   Patient Care Team: [name], NP as PCP - General (Internal Medicine) Curley Spice Virl Diamond, [place] as Pharmacist [name], RN as Case Manager (General Practice) Rebekah Chesterfield, LCSW as Social Worker (Licensed Clinical Social Worker)  Recent office visits: None  Hospital visits: None in previous 6 months  Objective:  Lab Results  Component Value Date   CREATININE 0.68 08/27/2020   CREATININE 0.82 04/12/2019   CREATININE 0.79 06/10/2018    Lab Results  Component Value Date   HGBA1C 7.5 (H) 08/27/2020   Last diabetic Eye exam:  Lab Results  Component Value Date/Time   HMDIABEYEEXA No Retinopathy 12/27/2018 12:00 AM    Last diabetic Foot exam: No results found for: HMDIABFOOTEX      Component Value Date/Time   CHOL 111 08/27/2020 1533   TRIG 108 08/27/2020 1533   HDL 51 08/27/2020 1533   CHOLHDL 2.2 08/27/2020 1533   LDLCALC 41 08/27/2020 1533    Hepatic Function Latest Ref Rng & Units 08/27/2020 04/12/2019 06/10/2018  Total Protein 6.1 - 8.1 g/dL 6.9 7.2 6.7  AST 10 - 35 U/L _0 ALT 6 - 29 U/L _1 Total Bilirubin 0.2 - 1.2 mg/dL 0.3 0.5 0.5    Social History   Tobacco Use  Smoking Status Never  Smokeless Tobacco Never   BP Readings from Last 3  Encounters:  08/27/20 140/77  07/09/20 122/70  06/05/20 122/70   Pulse Readings from Last 3 Encounters:  08/27/20 85  05/16/20 93  04/30/20 86   Wt Readings from Last 3 Encounters:  08/27/20 162 lb 6.4 oz (73.7 kg)  07/09/20 161 lb 12.8 oz (73.4 kg)  06/05/20 163 lb 6.4 oz (74.1 kg)    Assessment: Review of patient past medical history, allergies, medications, health status, including review of consultants reports, laboratory and other test data, was performed as part of comprehensive evaluation and provision of chronic care management services.   SDOH:  (Social Determinants of Health) assessments and interventions performed: none   CCM Care Plan  Allergies  Allergen Reactions   Aspirin Hives    Other reaction(s): Other (See Comments) Other Reaction: Not Assessed    Insulin Detemir Hives and Rash    Errythema, edema, heat at site of injection did not improve after 2 weeks of use.     Shellfish Allergy Anaphylaxis   Dapagliflozin Other (See Comments)    Yeast infections Yeast infection Farxiga   Influenza Vaccines Other (See Comments)    Forxiga   Other     Other reaction(s): Other (See Comments) Uncoded Allergy. Allergen: Shellfish, Other Reaction: Not Assessed    Flu vaccine given 1998 caused anaphylaxis    Medications Reviewed Today     Reviewed by Rennis Petty, RPH-CPP (Pharmacist) on 10/29/20 at  0845  Med List Status: <None>   Medication Order Taking? Sig Documenting Provider Last Dose Status Informant  acetaminophen (TYLENOL) 500 MG tablet [id number]  Take 650 mg by mouth 2 (two) times daily as needed. [provider]  Active   alendronate (FOSAMAX) 70 MG tablet [id number] Yes Take 1 tablet (70 mg total) by mouth once a week. Take with a full glass of water on an empty stomach. Olin Hauser, DO Taking Active   Ascorbic Acid (VITAMIN C) 1000 MG tablet [id number]  Take 1,000 mg by mouth daily as needed.  [provider]  Active             Med Note Winfield Cunas, [facility]   Mon Sep 20, 2018  4:05 PM) As needed to prevent illness  atorvastatin (LIPITOR) 40 MG tablet [id number] Yes Take 1 tablet by mouth once daily Olin Hauser, DO Taking Active   Biotin 1000 MCG tablet [id number]  Take 4,000 mcg by mouth every other day.  [provider]  Active Self           Med Note [facility] Ellison   Tue Jan 19, 2018 11:31 AM) [name] every other day  blood glucose meter kit and supplies [id number]  Dispense based on patient and insurance preference. Use up to four times daily as directed. (FOR ICD-10 E10.9, E11.9). [name], FNP  Active   Cholecalciferol (VITAMIN D3) 50 MCG (2000 UT) capsule [id number]  Take 1,000 Units by mouth daily.  [provider]  Active   insulin degludec (TRESIBA FLEXTOUCH) 100 UNIT/ML SOPN FlexTouch Pen [id number] Yes Inject 0.26 mLs (26 Units total) into the skin daily.  Patient taking differently: Inject 20 Units into the skin daily.   [name], NP Taking Active   metFORMIN (GLUCOPHAGE-XR) 500 MG 24 hr tablet [id number] Yes TAKE 2 TABLETS BY MOUTH ONCE DAILY WITH BREAKFAST Karamalegos, Devonne Doughty, DO Taking Active   [facility] ULTRA test strip [id number]  Check blood sugar 1 x daily as directed Olin Hauser, DO  Active   polyethylene glycol (MIRALAX / GLYCOLAX) 17 g packet [id number] No Take 17 g by mouth daily as needed.  Patient not taking: Reported on 10/29/2020   [provider] Not Taking Active   Semaglutide, 1 MG/DOSE, (OZEMPIC, 1 MG/DOSE,) 2 MG/1.5ML SOPN [id number] Yes INJECT 1 MG SUBCUTANEOUSLY ONCE A WEEK Karamalegos, Devonne Doughty, DO Taking Active   Skin Protectants, Misc. [facility] PERISHIELD) 3.8 % OINT [id number]  Apply to skin under breast as needed Olin Hauser, DO  Active   spironolactone (ALDACTONE) 25 MG tablet [id number] Yes Take 1 tablet by mouth once daily Olin Hauser, DO Taking Active   vitamin B-12  (CYANOCOBALAMIN) 1000 MCG tablet [id number]  Take 1,000 mcg by mouth daily.  [provider]  Active             Patient Active Problem List   Diagnosis Date Noted   Overweight with body mass index (BMI) of 29 to 29.9 in adult 08/28/2020   Osteoporosis 08/16/2019   Chronic bilateral low back pain without sciatica 11/22/2014   Chronic pain of both knees 11/22/2014   Essential hypertension 11/22/2014   Dyslipidemia associated with type 2 diabetes mellitus ([place]) 11/22/2014   Type 2 diabetes mellitus with hyperglycemia ([place]) 01/19/2013    Immunization History  Administered Date(s) Administered   PFIZER(Purple Top)SARS-COV-2 Vaccination 04/21/2019, 05/12/2019, 12/17/2019   Pneumococcal Conjugate-13 11/18/2017   Pneumococcal Polysaccharide-23 09/20/2008   Tdap 04/23/2007,  07/16/2017   Zoster, Live 11/12/2015    Conditions to be addressed/monitored: HTN, HLD, and DMII  Care Plan : General Pharmacy (Adult)  Updates made by Rennis Petty, RPH-CPP since 10/29/2020 12:00 AM     Problem: Disease Progression      Long-Range Goal: Disease Progression Prevented or Minimized   Start Date: 01/23/2020  Expected End Date: 04/22/2020  This Visit's Progress: On track  Recent Progress: On track  Priority: High  Note:   Current Barriers:  Unable to independently afford treatment regimen Patient APPROVED for [name] and Ozempic medication assistance from [employer] for 2022 calendar year  Pharmacist Clinical Goal(s):  Over the next 90 days, patient will achieve control of T2DM as evidenced by A1C <7.0%. through collaboration with PharmD and provider.  Over the next 90 days, patient will maintain control of LDL as evidenced by LDL <70 mg/dL through collaboration with PharmD and provider.  Over the next 90 days, patient will verbalize ability to afford treatment regimen. through collaboration with PharmD and provider.  Interventions: Inter-disciplinary care team collaboration  (see longitudinal plan of care) 1:1 collaboration with Olin Hauser, DO regarding development and update of comprehensive plan of care as evidenced by provider attestation and co-signature Reports received 1st dose of Shingrix vaccine on 8/31. Note patient due for 2nd dose 2-6 months after first Patient reports not currently using weekly pillbox, but that she has her own system that works for her and denies missed doses  Diabetes: Latest A1C elevated; current treatment: Tresiba 20 units once daily Metformin ER 500 mg - 2 tablets daily with breakfast Ozempic 1 mg once weekly Reports recent fasting blood sugars ranging 85-105 Reports blood sugar remain improved now that she is back to her reguar diet at home Denies hypoglycemia Note patient carries glucose tablets with her Discuss importance of having well-balanced meals throughout the day and limiting carbohydrate portion sizes Exercise: going to the gym 2-3 times/week - walks ~20 minutes and then has massage; patient also active during the day working Reports working on reducing work hours  Hyperlipidemia: Controlled; current treatment: atorvastatin 40 mg daily  Have counseled patient on importance of medication adherence Have discussed dietary management  Medication Assistance Reports has supply of Tresiba and Ozempic from [employer] patient assistance program to last >3 months     Patient Goals/Self-Care Activities Over the next 90 days, patient will:  Take medications as prescribed Check blood glucose daily, document, and provide at future appointments Keeps log in App on phone Collaborate with provider on medication access solutions Attends all scheduled provider appointments Calls pharmacy for medication refills Calls provider office for new concerns or questions Follow Up Plan: Telephone follow up appointment with care management team member scheduled for: 12/24/2020 at 8:30 AM      Patient's preferred  pharmacy is:  [facility] [address], [place] - [place] [address] Phone: [phone] Fax: [phone]   Follow Up:  Patient agrees to Care Plan and Follow-up.  Wallace Cullens, PharmD, [place], CPP Clinical Pharmacist [facility] [phone]

## 2020-10-30 ENCOUNTER — Ambulatory Visit: Payer: PPO | Admitting: Licensed Clinical Social Worker

## 2020-10-30 DIAGNOSIS — E1165 Type 2 diabetes mellitus with hyperglycemia: Secondary | ICD-10-CM

## 2020-10-30 DIAGNOSIS — G8929 Other chronic pain: Secondary | ICD-10-CM

## 2020-10-30 DIAGNOSIS — I10 Essential (primary) hypertension: Secondary | ICD-10-CM

## 2020-10-30 DIAGNOSIS — F419 Anxiety disorder, unspecified: Secondary | ICD-10-CM

## 2020-11-02 NOTE — Chronic Care Management (AMB) (Signed)
Chronic Care Management    Clinical Social Work Note  11/02/2020 Name: Summer Young MRN: [id number] DOB: 03-18-1950  Summer Young is a 70 y.o. year old female who is a primary care patient of [name], NP. The CCM team was consulted to assist the patient with chronic disease management and/or care coordination needs related to: Mental Health Counseling and Resources.   Engaged with patient by telephone for follow up visit in response to provider referral for social work chronic care management and care coordination services.   Consent to Services:  The patient was given information about Chronic Care Management services, agreed to services, and gave verbal consent prior to initiation of services.  Please see initial visit note for detailed documentation.   Patient agreed to services and consent obtained.   Consent to Services:  The patient was given information about Care Management services, agreed to services, and gave verbal consent prior to initiation of services.  Please see initial visit note for detailed documentation.   Patient agreed to services today and consent obtained.   Assessment: Engaged with patient by phone in response to provider referral for social work care coordination services: [place] and Resources.    Patient continues to maintain positive progress with care plan goals. Patient continues to utilize healthy coping skills to manage stress and maintain a healthy lifestyle. See Care Plan below for interventions and patient self-care activities.  Recommendation: Patient may benefit from, and is in agreement work with LCSW to address care coordination needs and will continue to work with the clinical team to address health care and disease management related needs.   Follow up Plan: Patient would like continued follow-up from CCM LCSW .  per patient's request will follow up in 01/29/21.  Will call office if needed prior to next encounter.  SDOH  (Social Determinants of Health) assessments and interventions performed:    Advanced Directives Status: Not addressed in this encounter.  CCM Care Plan  Allergies  Allergen Reactions   Aspirin Hives    Other reaction(s): Other (See Comments) Other Reaction: Not Assessed    Insulin Detemir Hives and Rash    Errythema, edema, heat at site of injection did not improve after 2 weeks of use.     Shellfish Allergy Anaphylaxis   Dapagliflozin Other (See Comments)    Yeast infections Yeast infection Farxiga   Influenza Vaccines Other (See Comments)    Forxiga   Other     Other reaction(s): Other (See Comments) Uncoded Allergy. Allergen: Shellfish, Other Reaction: Not Assessed    Flu vaccine given 1998 caused anaphylaxis    Outpatient Encounter Medications as of 10/30/2020  Medication Sig Note   acetaminophen (TYLENOL) 500 MG tablet Take 650 mg by mouth 2 (two) times daily as needed.    alendronate (FOSAMAX) 70 MG tablet Take 1 tablet (70 mg total) by mouth once a week. Take with a full glass of water on an empty stomach.    Ascorbic Acid (VITAMIN C) 1000 MG tablet Take 1,000 mg by mouth daily as needed.  09/20/2018: As needed to prevent illness   atorvastatin (LIPITOR) 40 MG tablet Take 1 tablet by mouth once daily    Biotin 1000 MCG tablet Take 4,000 mcg by mouth every other day.  01/19/2018: Takes every other day   blood glucose meter kit and supplies Dispense based on patient and insurance preference. Use up to four times daily as directed. (FOR ICD-10 E10.9, E11.9).    Cholecalciferol (VITAMIN  D3) 50 MCG (2000 UT) capsule Take 1,000 Units by mouth daily.     insulin degludec (TRESIBA FLEXTOUCH) 100 UNIT/ML SOPN FlexTouch Pen Inject 0.26 mLs (26 Units total) into the skin daily. (Patient taking differently: Inject 20 Units into the skin daily.)    metFORMIN (GLUCOPHAGE-XR) 500 MG 24 hr tablet TAKE 2 TABLETS BY MOUTH ONCE DAILY WITH BREAKFAST    ONETOUCH ULTRA test strip Check blood sugar  1 x daily as directed    polyethylene glycol (MIRALAX / GLYCOLAX) 17 g packet Take 17 g by mouth daily as needed. (Patient not taking: Reported on 10/29/2020)    Semaglutide, 1 MG/DOSE, (OZEMPIC, 1 MG/DOSE,) 2 MG/1.5ML SOPN INJECT 1 MG SUBCUTANEOUSLY ONCE A WEEK    Skin Protectants, Misc. (AMERIDERM PERISHIELD) 3.8 % OINT Apply to skin under breast as needed    spironolactone (ALDACTONE) 25 MG tablet Take 1 tablet by mouth once daily    vitamin B-12 (CYANOCOBALAMIN) 1000 MCG tablet Take 1,000 mcg by mouth daily.     No facility-administered encounter medications on file as of 10/30/2020.    Patient Active Problem List   Diagnosis Date Noted   Overweight with body mass index (BMI) of 29 to 29.9 in adult 08/28/2020   Osteoporosis 08/16/2019   Chronic bilateral low back pain without sciatica 11/22/2014   Chronic pain of both knees 11/22/2014   Essential hypertension 11/22/2014   Dyslipidemia associated with type 2 diabetes mellitus ([place]) 11/22/2014   Type 2 diabetes mellitus with hyperglycemia ([place]) 01/19/2013    Conditions to be addressed/monitored: Anxiety; Mental Health Concerns   Care Plan : General Social Work (Adult)  Updates made by Christa See D, LCSW since 11/02/2020 12:00 AM     Problem: Depression Identification (Depression)      Long-Range Goal: Depressive Symptoms Identified   Start Date: 01/24/2020  This Visit's Progress: On track  Recent Progress: On track  Priority: Medium  Note:   Timeframe:  Long-Range Goal Priority:  Medium  Start Date:    03/28/20                      Expected End Date:   02/16/21               Follow Up Date 01/29/21   Current Barriers:  Acute Mental Health needs related to anxiety/stress Mental Health Concerns  Suicidal Ideation/Homicidal Ideation: No Clinical Social Work Goal(s):  Over the next 120 days, patient will work with SW bi-monthly by telephone or in person to reduce or manage symptoms related to anxiety Over the next 120  days, patient will work with SW to address concerns related to gaining mental health support and develop better coping skills to manage symptoms Interventions: Patient interviewed and appropriate assessments performed Pt reports that she is managing symptoms of depression and anxiety well. Insomnia symptoms have resolved 09/13: Patient reports a decrease in anxiety "everything is under control" Patient reports strain in marriage and is interested in therapy (Bilingual counselor requested) Patient prefers in person sessions 08/16: CCM LCSW informed patient of update regarding referral. CCM LCSW is awaiting a response from multiple local agencies regarding their ability to accept patient insurance, in addition, to having bilingual therapist availability 09/13: Patient reports relationship with spouse has improved and they are working on strengthening communication with one another. Services are not needed at this time CCM LCSW provided support and encouragement. Strategies to assist with communication with spouse discussed. Patient provided verbal consent for CCM LCSW  to complete referral to therapy 08/16: CCM LCSW discussed strategies to assist in management of stress Patient was successful in identifying healthy coping skills. She has returned to the gym and continues to meet with friends 09/13: Patient shared that she continues to eat balanced meals and maintain active lifestyle which helped her manage health conditions and relieve stressors Pt reports that she is currently managing back pain from arthritis with Tylenol. Feels it has been more effective then prescribed medicine  Patient was successful in reflection and identifying progress of health goals, including decreasing the amount of medications she was taking "I came a long way" CCM LCSW discussed plans with patient for ongoing care management follow up and provided patient with direct contact information for care management team CCM LCSW advised  patient to contact CCM program for any urgent case management needs Solution-Focused Strategies, Active listening / Reflection utilized , Emotional Supportive Provided, Participation in counseling encouraged , and Verbalization of feelings encouraged  1:1 collaboration with primary care provider regarding development and update of comprehensive plan of care as evidenced by provider attestation and co-signature Patient Self Care Activities:  Attend all scheduled provider appointments Continue utilizing healthy coping skills and strategies on communication Contact clinic with any questions or concerns           Christa See, MSW, [email or web address] Phone [phone] 5:12 PM

## 2020-11-02 NOTE — Patient Instructions (Signed)
Visit Information   Goals Addressed             This Visit's Progress    Track and Manage My Symptoms-Depression   On track    Timeframe:  Long-Range Goal Priority:  Medium  Start Date:    03/28/20                      Expected End Date:   02/16/21               Follow Up Date -01/29/21   Patient Self Care Activities:  Attend all scheduled provider appointments Continue utilizing healthy coping skills and strategies on communication Contact clinic with any questions or concerns        Patient verbalizes understanding of instructions provided today and agrees to view in [place].   Telephone follow up appointment with care management team member scheduled for:01/29/21  Christa See, MSW, [email or web address] Phone [phone] 5:11 PM

## 2020-11-16 DIAGNOSIS — E782 Mixed hyperlipidemia: Secondary | ICD-10-CM

## 2020-11-16 DIAGNOSIS — I10 Essential (primary) hypertension: Secondary | ICD-10-CM | POA: Diagnosis not present

## 2020-11-16 DIAGNOSIS — E1165 Type 2 diabetes mellitus with hyperglycemia: Secondary | ICD-10-CM | POA: Diagnosis not present

## 2020-11-17 ENCOUNTER — Other Ambulatory Visit: Payer: Self-pay | Admitting: Family Medicine

## 2020-11-17 DIAGNOSIS — Z794 Long term (current) use of insulin: Secondary | ICD-10-CM

## 2020-11-17 DIAGNOSIS — E1169 Type 2 diabetes mellitus with other specified complication: Secondary | ICD-10-CM

## 2020-11-17 DIAGNOSIS — E1165 Type 2 diabetes mellitus with hyperglycemia: Secondary | ICD-10-CM

## 2020-12-03 ENCOUNTER — Ambulatory Visit (INDEPENDENT_AMBULATORY_CARE_PROVIDER_SITE_OTHER): Payer: PPO | Admitting: Pharmacist

## 2020-12-03 DIAGNOSIS — E1165 Type 2 diabetes mellitus with hyperglycemia: Secondary | ICD-10-CM

## 2020-12-03 NOTE — Patient Instructions (Signed)
Visit Information  PATIENT GOALS:  Goals Addressed             This Visit's Progress    Pharmacy Goals       Our goal A1c is less than 7%. This corresponds with fasting sugars less than 130 and 2 hour after meal sugars less than 180. Please check your blood sugars and keep log of results  Our goal bad cholesterol, or LDL, is less than 70 . This is why it is important to continue taking your atorvastatin  Please check your home blood pressure, keep a log of the results and bring this with you to your medical appointments.  Feel free to call me with any questions or concerns. I look forward to our next call!   Wallace Cullens, PharmD, [place] [phone]         The patient verbalized understanding of instructions, educational materials, and care plan provided today and declined offer to receive copy of patient instructions, educational materials, and care plan.   Telephone follow up appointment with care management team member scheduled for: 12/24/2020 at 8:30 AM

## 2020-12-03 NOTE — Chronic Care Management (AMB) (Signed)
Chronic Care Management Pharmacy Note  12/03/2020 Name:  Summer Young MRN:  [id number] DOB:  1951-01-26   Subjective: Summer Young is an 70 y.o. year old female who is a primary patient of Summer Fenton, NP.  The CCM team was consulted for assistance with disease management and care coordination needs.    Engaged with patient by telephone for follow up visit in response to provider referral for pharmacy case management and/or care coordination services.   Consent to Services:  The patient was given information about Chronic Care Management services, agreed to services, and gave verbal consent prior to initiation of services.  Please see initial visit note for detailed documentation.   Patient Care Team: Summer Fenton, NP as PCP - General (Internal Medicine) Curley Spice, Virl Diamond, [place] as Pharmacist Hall Busing Nobie Putnam, RN as Case Manager ([place]) Rebekah Chesterfield, LCSW as Social Worker (Licensed Clinical Social Worker)  Hospital visits: None in previous 6 months  Objective:  Lab Results  Component Value Date   CREATININE 0.68 08/27/2020   CREATININE 0.82 04/12/2019   CREATININE 0.79 06/10/2018    Lab Results  Component Value Date   HGBA1C 7.5 (H) 08/27/2020   Last diabetic Eye exam:  Lab Results  Component Value Date/Time   HMDIABEYEEXA No Retinopathy 12/27/2018 12:00 AM    Last diabetic Foot exam: No results found for: HMDIABFOOTEX      Component Value Date/Time   CHOL 111 08/27/2020 1533   TRIG 108 08/27/2020 1533   HDL 51 08/27/2020 1533   CHOLHDL 2.2 08/27/2020 1533   [place] 41 08/27/2020 1533     Social History   Tobacco Use  Smoking Status Never  Smokeless Tobacco Never   BP Readings from Last 3 Encounters:  08/27/20 140/77  07/09/20 122/70  06/05/20 122/70   Pulse Readings from Last 3 Encounters:  08/27/20 85  05/16/20 93  04/30/20 86   Wt Readings from Last 3 Encounters:  08/27/20 162 lb 6.4 oz (73.7 kg)  07/09/20 161 lb  12.8 oz (73.4 kg)  06/05/20 163 lb 6.4 oz (74.1 kg)    Assessment: Review of patient past medical history, allergies, medications, health status, including review of consultants reports, laboratory and other test data, was performed as part of comprehensive evaluation and provision of chronic care management services.   SDOH:  (Social Determinants of Health) assessments and interventions performed:    CCM Care Plan  Allergies  Allergen Reactions   Aspirin Hives    Other reaction(s): Other (See Comments) Other Reaction: Not Assessed    Insulin Detemir Hives and Rash    Errythema, edema, heat at site of injection did not improve after 2 weeks of use.     Shellfish Allergy Anaphylaxis   Dapagliflozin Other (See Comments)    Yeast infections Yeast infection Farxiga   Influenza Vaccines Other (See Comments)    Forxiga   Other     Other reaction(s): Other (See Comments) Uncoded Allergy. Allergen: Shellfish, Other Reaction: Not Assessed    Flu vaccine given 1998 caused anaphylaxis    Medications Reviewed Today     Reviewed by Rennis Petty, RPH-CPP (Pharmacist) on 10/29/20 at [place] List Status: <None>   Medication Order Taking? Sig Documenting Provider Last Dose Status Informant  acetaminophen (TYLENOL) 500 MG tablet [id number]  Take 650 mg by mouth 2 (two) times daily as needed. [provider]  Active   alendronate (FOSAMAX) 70 MG tablet [id number] Yes Take 1 tablet (  70 mg total) by mouth once a week. Take with a full glass of water on an empty stomach. Olin Hauser, DO Taking Active   Ascorbic Acid (VITAMIN C) 1000 MG tablet [id number]  Take 1,000 mg by mouth daily as needed.  [provider]  Active            Med Note Winfield Cunas, [facility]   Mon Sep 20, 2018  4:05 PM) As needed to prevent illness  atorvastatin (LIPITOR) 40 MG tablet [id number] Yes Take 1 tablet by mouth once daily Olin Hauser, DO Taking Active   Biotin 1000 MCG  tablet [id number]  Take 4,000 mcg by mouth every other day.  [provider]  Active Self           Med Note Barrington Ellison   Tue Jan 19, 2018 11:31 AM) [name] every other day  blood glucose meter kit and supplies [id number]  Dispense based on patient and insurance preference. Use up to four times daily as directed. (FOR ICD-10 E10.9, E11.9). [name], FNP  Active   Cholecalciferol (VITAMIN D3) 50 MCG (2000 UT) capsule [id number]  Take 1,000 Units by mouth daily.  [provider]  Active   insulin degludec (TRESIBA FLEXTOUCH) 100 UNIT/ML SOPN FlexTouch Pen [id number] Yes Inject 0.26 mLs (26 Units total) into the skin daily.  Patient taking differently: Inject 20 Units into the skin daily.   [name], NP Taking Active   metFORMIN (GLUCOPHAGE-XR) 500 MG 24 hr tablet [id number] Yes TAKE 2 TABLETS BY MOUTH ONCE DAILY WITH BREAKFAST Karamalegos, Devonne Doughty, DO Taking Active   [facility] ULTRA test strip [id number]  Check blood sugar 1 x daily as directed Olin Hauser, DO  Active   polyethylene glycol (MIRALAX / GLYCOLAX) 17 g packet [id number] No Take 17 g by mouth daily as needed.  Patient not taking: Reported on 10/29/2020   [provider] Not Taking Active   Semaglutide, 1 MG/DOSE, (OZEMPIC, 1 MG/DOSE,) 2 MG/1.5ML SOPN [id number] Yes INJECT 1 MG SUBCUTANEOUSLY ONCE A WEEK Karamalegos, Devonne Doughty, DO Taking Active   Skin Protectants, Misc. [facility] PERISHIELD) 3.8 % OINT [id number]  Apply to skin under breast as needed Olin Hauser, DO  Active   spironolactone (ALDACTONE) 25 MG tablet [id number] Yes Take 1 tablet by mouth once daily Olin Hauser, DO Taking Active   vitamin B-12 (CYANOCOBALAMIN) 1000 MCG tablet [id number]  Take 1,000 mcg by mouth daily.  [provider]  Active             Patient Active Problem List   Diagnosis Date Noted   Overweight with body mass index (BMI) of 29 to 29.9 in adult 08/28/2020    Osteoporosis 08/16/2019   Chronic bilateral low back pain without sciatica 11/22/2014   Chronic pain of both knees 11/22/2014   Essential hypertension 11/22/2014   Dyslipidemia associated with type 2 diabetes mellitus ([place]) 11/22/2014   Type 2 diabetes mellitus with hyperglycemia ([place]) 01/19/2013    Immunization History  Administered Date(s) Administered   PFIZER(Purple Top)SARS-COV-2 Vaccination 04/21/2019, 05/12/2019, 12/17/2019   Pneumococcal Conjugate-13 11/18/2017   Pneumococcal Polysaccharide-23 09/20/2008   Tdap 04/23/2007, 07/16/2017   Zoster, Live 11/12/2015    Conditions to be addressed/monitored: HTN, HLD, and DMII  Care Plan : General Pharmacy (Adult)  Updates made by Rennis Petty, RPH-CPP since 12/03/2020 12:00 AM     Problem: Disease Progression      Long-Range Goal: Disease Progression Prevented or  Minimized   Start Date: 01/23/2020  Expected End Date: 04/22/2020  This Visit's Progress: On track  Recent Progress: On track  Priority: High  Note:   Current Barriers:  Unable to independently afford treatment regimen Patient APPROVED for [name] and Ozempic medication assistance from [employer] for 2022 calendar year  Pharmacist Clinical Goal(s):  Over the next 90 days, patient will achieve control of T2DM as evidenced by A1C <7.0%. through collaboration with PharmD and provider.  Over the next 90 days, patient will maintain control of LDL as evidenced by LDL <70 mg/dL through collaboration with PharmD and provider.  Over the next 90 days, patient will verbalize ability to afford treatment regimen. through collaboration with PharmD and provider.  Interventions: Inter-disciplinary care team collaboration (see longitudinal plan of care) 1:1 collaboration with Olin Hauser, DO regarding development and update of comprehensive plan of care as evidenced by provider attestation and co-signature  Medication Assistance Will collaborate with Neoga Simcox for aid to patient with re-applying Tresiba and Ozempic medication assistance from [employer] for 2023 calendar year  Diabetes: Latest A1C elevated; current treatment: Tresiba 20 units once daily Metformin ER 500 mg - 2 tablets daily with breakfast Ozempic 1 mg once weekly Reports recent fasting blood sugars ranging 91-146 Denies hypoglycemia Note patient carries glucose tablets with her  Hyperlipidemia: Controlled; current treatment: atorvastatin 40 mg daily  Have counseled patient on importance of medication adherence Have discussed dietary management  Lower Back Pain: Reports has started having significant lower back pain again. 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 Reports has taken days off of work recently due to the pain and has been trying to limit activities such as sweeping Encourage patient to reach out to PCP to schedule an appointment regarding her back pain, but patient states she prefers to wait until her scheduled visit with PCP on 10/31. States that she will call office sooner for new or worsening symptoms    Patient Goals/Self-Care Activities Over the next 90 days, patient will:  Take medications as prescribed Check blood glucose daily, document, and provide at future appointments Keeps log in App on phone Collaborate with provider on medication access solutions Attends all scheduled provider appointments Calls pharmacy for medication refills Calls provider office for new concerns or questions  Follow Up Plan: Telephone follow up appointment with care management team member scheduled for: 12/24/2020 at 8:30 AM       Patient's preferred pharmacy is:  [facility] [address], [place] - [place] [address] Phone: [phone] Fax:  [phone]   Follow Up:  Patient agrees to Care Plan and Follow-up.  Wallace Cullens, PharmD, [place], CPP Clinical Pharmacist [facility] [phone]

## 2020-12-05 ENCOUNTER — Telehealth: Payer: Self-pay | Admitting: Pharmacy Technician

## 2020-12-05 DIAGNOSIS — Z596 Low income: Secondary | ICD-10-CM

## 2020-12-05 NOTE — Progress Notes (Signed)
Medication Assistance Referral  *FOR 2023 RE ENROLLMENT  Referral From: [facility] Embedded RPh Dorthula Perfect   Medication/Company: [name] / [employer] Patient application portion:  Education officer, museum portion: Faxed  to Webb Silversmith, NP Provider address/fax verified via: Office website  Medication/Company: [name] / [employer] Patient application portion:  Education officer, museum portion: Faxed  to Webb Silversmith, NP Provider address/fax verified via: [employer]. Elveria Lauderbaugh, [place]  [phone]

## 2020-12-06 ENCOUNTER — Ambulatory Visit: Payer: Self-pay

## 2020-12-06 ENCOUNTER — Telehealth: Payer: PPO | Admitting: General Practice

## 2020-12-06 DIAGNOSIS — E782 Mixed hyperlipidemia: Secondary | ICD-10-CM

## 2020-12-06 DIAGNOSIS — I10 Essential (primary) hypertension: Secondary | ICD-10-CM

## 2020-12-06 DIAGNOSIS — E1165 Type 2 diabetes mellitus with hyperglycemia: Secondary | ICD-10-CM

## 2020-12-06 DIAGNOSIS — M545 Low back pain, unspecified: Secondary | ICD-10-CM

## 2020-12-06 DIAGNOSIS — G8929 Other chronic pain: Secondary | ICD-10-CM

## 2020-12-06 DIAGNOSIS — M810 Age-related osteoporosis without current pathological fracture: Secondary | ICD-10-CM

## 2020-12-06 DIAGNOSIS — M170 Bilateral primary osteoarthritis of knee: Secondary | ICD-10-CM

## 2020-12-06 NOTE — Patient Instructions (Signed)
Visit Information  PATIENT GOALS:  Goals Addressed             This Visit's Progress    RNCM: Manage Chronic Pain       Timeframe:  Long-Range Goal Priority:  High Start Date:    12-06-2020                         Expected End Date:   12-06-2021                    Follow Up Date 01/24/2021    - bring pain diary to all appointments - call for medicine refill 2 or 3 days before it runs out - develop a personal pain management plan - keep track of prescription refills - plan exercise or activity when pain is best controlled - prioritize tasks for the day - start a pain diary - track times pain is worst and when it is best - track what makes the pain worse and what makes it better - use ice or heat for pain relief - work slower and less intense when having pain    Why is this important?   Day-to-day life can be hard when you have chronic pain.  Pain medicine is just one piece of the treatment puzzle.  You can try these action steps to help you manage your pain.    12-06-2020: The patient rates her pain level of a 7 today on a scale of 0-10. The patient states that this is her biggest concern. The patient states that she is going to talk to the pcp about this on 12-17-2020 at her appointment. The patient is taking Tylenol arthritis and using a heating pad. This helps most days. 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 Education and support given.      RNCM: Prevent Falls and Broken Bones-Osteoporosis       Timeframe:  Long-Range Goal Priority:  High Start Date:    12-06-2020                         Expected End Date:      12-06-2021                 Follow Up Date 01-24-2021   - add more outdoor lighting - always use handrails on the stairs - always wear shoes or slippers with non-slip sole - get at least 10 minutes of activity every day - install bathroom grab bars - keep a flashlight by the bed - keep cell phone with me always - make an  emergency alert plan in case I fall - pick up clutter from the floors - remove, or use a non-slip pad, with my throw rugs - use a cane or walker - use a nightlight in the bathroom - wear low heeled or flat shoes with non-skid soles    Why is this important?   When you fall, there are 3 things that control if a bone breaks or not.  These are the fall itself, how hard and the direction that you fall and how fragile your bones are.  Preventing falls is very important for you because of fragile bones.     12-06-2020: The patient is careful and knows when to pace her activities. Denies any issues with falls at this time. Biggest concern is chronic back pain and discomfort  Patient verbalizes understanding of instructions provided today and agrees to view in [place].   Telephone follow up appointment with care management team member scheduled for: 01-24-2021 at [place] am  Noreene Larsson RN, MSN, [place] Mobile: [phone]

## 2020-12-06 NOTE — Chronic Care Management (AMB) (Signed)
Chronic Care Management   CCM RN Visit Note  12/06/2020 Name: Summer Young MRN: [id number] DOB: March 15, 1950  Subjective: Summer Young is a 70 y.o. year old female who is a primary care patient of [name], NP. The care management team was consulted for assistance with disease management and care coordination needs.    Engaged with patient by telephone for follow up visit in response to provider referral for case management and/or care coordination services.   Consent to Services:  The patient was given information about Chronic Care Management services, agreed to services, and gave verbal consent prior to initiation of services.  Please see initial visit note for detailed documentation.   Patient agreed to services and verbal consent obtained.   Assessment: Review of patient past medical history, allergies, medications, health status, including review of consultants reports, laboratory and other test data, was performed as part of comprehensive evaluation and provision of chronic care management services.   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     Shellfish Allergy Anaphylaxis   Dapagliflozin Other (See Comments)    Yeast infections Yeast infection Farxiga   Influenza Vaccines Other (See Comments)    Forxiga   Other     Other reaction(s): Other (See Comments) Uncoded Allergy. Allergen: Shellfish, Other Reaction: Not Assessed    Flu vaccine given 1998 caused anaphylaxis    Outpatient Encounter Medications as of 12/06/2020  Medication Sig Note   acetaminophen (TYLENOL) 500 MG tablet Take 650 mg by mouth 2 (two) times daily as needed.    alendronate (FOSAMAX) 70 MG tablet Take 1 tablet (70 mg total) by mouth once a week. Take with a full glass of water on an empty stomach.    Ascorbic Acid (VITAMIN C) 1000 MG tablet Take 1,000 mg by mouth daily as needed.  09/20/2018: As needed to prevent illness   atorvastatin (LIPITOR) 40 MG tablet Take 1 tablet by mouth once daily    Biotin 1000 MCG tablet Take 4,000 mcg by mouth every other day.  01/19/2018: Takes every other day   blood glucose meter kit and supplies Dispense based on patient and insurance preference. Use up to four times daily as directed. (FOR ICD-10 E10.9, E11.9).    Cholecalciferol (VITAMIN D3) 50 MCG (2000 UT) capsule Take 1,000 Units by mouth daily.     insulin degludec (TRESIBA FLEXTOUCH) 100 UNIT/ML SOPN FlexTouch Pen Inject 0.26 mLs (26 Units total) into the skin daily. (Patient taking differently: Inject 20 Units into the skin daily.)    metFORMIN (GLUCOPHAGE-XR) 500 MG 24 hr tablet TAKE 2 TABLETS BY MOUTH ONCE DAILY  WITH BREAKFAST    ONETOUCH ULTRA test strip Check blood sugar 1 x daily as directed    polyethylene glycol (MIRALAX / GLYCOLAX) 17 g packet Take 17 g by mouth daily as needed. (Patient not taking: Reported on 10/29/2020)    Semaglutide, 1 MG/DOSE, (OZEMPIC, 1 MG/DOSE,) 2 MG/1.5ML SOPN INJECT 1 MG SUBCUTANEOUSLY ONCE A WEEK     Skin Protectants, Misc. (AMERIDERM PERISHIELD) 3.8 % OINT Apply to skin under breast as needed    spironolactone (ALDACTONE) 25 MG tablet Take 1 tablet by mouth once daily    vitamin B-12 (CYANOCOBALAMIN) 1000 MCG tablet Take 1,000 mcg by mouth daily.     No facility-administered encounter medications on file as of 12/06/2020.    Patient Active Problem List   Diagnosis Date Noted   Overweight with body mass index (BMI) of 29 to 29.9 in adult 08/28/2020   Osteoporosis 08/16/2019   Chronic bilateral low back pain without sciatica 11/22/2014   Chronic pain of both knees 11/22/2014   Essential hypertension 11/22/2014   Dyslipidemia associated with type 2 diabetes mellitus ([place]) 11/22/2014   Type 2 diabetes mellitus with hyperglycemia ([place]) 01/19/2013    Conditions to be addressed/monitored:HTN, HLD, DMII, Osteoporosis, and chronic pain  Care Plan : RNCM: Diabetes Type 2 (Adult)  Updates made by [name], RN since 12/06/2020 12:00 AM  Completed 12/06/2020   Problem: RNCM: Management of DM Resolved 12/06/2020  Priority: High     Long-Range Goal: RNCM: Management of DM Completed 12/06/2020  Recent Progress: On track  Priority: Medium  Note:   Objective: Resolving, duplicate goal Lab Results  Component Value Date   HGBA1C 7.5 (H) 08/27/2020     Lab Results  Component Value Date   CREATININE 0.68 08/27/2020   CREATININE 0.82 04/12/2019   CREATININE 0.79 06/10/2018    No results found for: EGFR Current Barriers:  Knowledge Deficits related to basic Diabetes pathophysiology and self care/management Knowledge Deficits related to medications used for management of diabetes Knowledge Deficits related to self administration of injectable diabetes medications (Ozempic) Limited Social Support Unable to independently manage DM as evidence of hemoglobin A1C or 7.4 on 01-18-2020, new on 04-30-2020 was 7.2 Lacks social connections Does not contact provider office for  questions/concerns Case Manager Clinical Goal(s):  Over the next 120 days, patient will demonstrate improved adherence to prescribed treatment plan for diabetes self care/management as evidenced by:  daily monitoring and recording of CBG  adherence to ADA/ carb modified diet exercise 4/5 days/week adherence to prescribed medication regimen Interventions:  Provided education to patient about basic DM disease process Reviewed medications with patient and discussed importance of medication adherence. 06-21-2020- the patient is taking medications as ordered. No issues at this time.  Discussed plans with patient for ongoing care management follow up and provided patient with direct contact information for care management team Provided patient with written educational materials related to hypo and hyperglycemia and importance of correct treatment. 06-21-2020: The patient denies any issue with lows at this time. Range has been 85-128. The patient is hopeful her new hemoglobin A1C will be less than 7.0 Advised patient, providing education and rationale, to check cbg BID and record, calling pcp for findings outside established parameters.  The patient states 5 days ago her blood sugars were 175-185 but they have come back down to 96 range. She is mindful of blood sugar readings.  Wants to stay on track. 06-21-2020: The patient states that he blood sugar was 85 this am.  The highest it has been is 36 recently. She is monitoring her dietary intake and is working on getting back to the gym. She is going to her step-daughters wedding in [place] this weekend, reminded the patient to monitor her dietary intake. She states that she does not even like wedding cake.  Review of patient status, including review of consultants reports, relevant laboratory and other test results, and medications completed. 09/06/2020: Reviewed with patient. - barriers to adherence to treatment plan identified - blood glucose monitoring  encouraged- fasting <130 and post prandial <180. 06-21-2020: Reviewed - blood glucose readings reviewed-  06-21-2020: range over the last 2 weeks have been 85-128. 09/06/2020: Patient reports recent FBS: 88 and then other blood sugar readings in the afternoon range from 140 - 165. - mutual A1C goal set or reviewed- goal of 7.0 or less- is hopeful at 04-30-2020 appointment for <7.0. 06-21-2020: Her hemoglobin A1C on 3-14- was 7.2. The patient states she is working harder to get it down even more. 09/06/2020: Patient's recent A1C increased to 7.5 from 7.2 in March 2022.  Patient states she was on vacation with family and ate foods she enjoyed and was not strict to adhering to proper diet.  She did check her blood sugar occasionally while on vacation and made sure her blood sugar did not go over 200.  She is back to eating well balanced meals now and is more mindful of eating proper diet. - resources required to improve adherence to care identified. 06-21-2020: Continues to work with CCM team to optimize health and well being.  - self-awareness of signs/symptoms of hypo or hyperglycemia encouraged. 06-21-2020: Is aware of the sx and sx of changes and knows how to correct. Denies any highs or lows. 09/06/2020: Patient prefers not to increase Ozempic as discussed as a possibility after most recent A1C increase because she is fearful her blood sugars will go too low and she would become hypoglycemic.  Denies any signs/symptoms of hyper or hypoglycemia at this time. - use of blood glucose monitoring log promoted. Patient Goals/Self-Care Activities Over the next 120 days, patient will:  - UNABLE to independently manage DM Adheres to prescribed ADA/carb modified Follow Up Plan: Face to Face appointment with care management team member scheduled for:    12-06-2020 at 9:45 am     Task: RNCM: Alleviate Barriers to Glycemic Management Completed 12/06/2020  Outcome: Positive  Note:   Care Management Activities:    - barriers  to adherence to treatment plan identified - blood glucose monitoring encouraged - blood glucose readings reviewed - mutual A1C goal set or reviewed - resources required to improve adherence to care identified - self-awareness of signs/symptoms of hypo or hyperglycemia encouraged - use of blood glucose monitoring log promoted        Care Plan : RNCM: Chronic Back  Pain (Adult)  Updates made by [name], RN since 12/06/2020 12:00 AM  Completed 12/06/2020   Problem: RNCM: Pain Management Plan (Chronic Pain) Resolved 12/06/2020  Priority: High     Long-Range Goal: RNCM: Pain Management Plan Developed for Chroni Back pain Completed 12/06/2020  Recent Progress: On track  Priority: High  Note:   Current Barriers: resolving, duplicate goal Knowledge Deficits related to management of Chronic back pain and osteoarthritis Care Coordination needs related to effective management of pain in a patient with chronic back pain and discomfort Chronic Disease Management support and education needs related to Chronic pain and discomfort  Lacks caregiver support.  Unable  to independently manage pain and discomfort of chronic back pain Does not adhere to provider recommendations re: years ago the patient admits she did not complete PT on her back from a work injury and feels this significantly impacts her ability to have relief now.  Does not contact provider office for questions/concerns  Nurse Case Manager Clinical Goal(s):  Over the next 120 days, patient will verbalize understanding of plan for management of Chronic pain and discomfort  Over the next 120 days, patient will work with pcp and CCM to address needs related to ongoing chronic pain and discomfort Over the next 120 days, patient will demonstrate a decrease in pain exacerbations as evidenced by compliance with the plan of care and exploring options that will benefit the patient and help with pain relief  Over the next 120 days,  patient will demonstrate improved adherence to prescribed treatment plan for chronic pain and discomfort  as evidenced bydecreased pain level  Over the next 120 days, patient will demonstrate improved health management independence as evidenced byable to perform ADLS/IADLS  Interventions:  1:1 collaboration with [name], Lupita Raider, FNP regarding development and update of comprehensive plan of care as evidenced by provider attestation and co-signature Inter-disciplinary care team collaboration (see longitudinal plan of care) Evaluation of current treatment plan related to chronic pain and discomfort and patient's adherence to plan as established by provider. 04-12-2020: The patient is not working as much now and this is helping with the back pain and discomfort. 06-21-2020: The patient states she continues to have pain but she is managing with the use of Tylenol arthritis. She is taking it every other day. She states that she was taking Meloxicam but it made her dizzy and nauseated. She says she is sticking with the Tylenol and that is working well for her. She is optimistic and has decided not to let it stop her with what she wants to do. 09/06/2020: Patient seen by PCP on 08/27/2020 for bilateral hip pain.  During office visit patient received Toradol $RemoveBeforeDE'30mg'[id number]$  IM injection, new order for Fexeril PRN at night and encouraged back exercises along with stretching and massage, bilateral hip x-rays ordered.  Patient had bilateral hip x-rays done. I reviewed the x-ray results with patient which showed mild degenerative changes only, no acute injuries noted. Patient taking Tylenol 650 mg 1 tablet in AM and if she is "really hurting" she will take a second tablet in the evening. Patient did start taking Flexeril at night and now reports she is feeling "spaced out" and having difficult focusing in the mornings following taking Flexeril at night.  I explained this is most likely a side effect from the Flexeril.  She is leaning  towards not taking it any longer since it has not helped a great deal.  Patient voiced how the bilateral hip pain is really impacting her ability to work at her job.   Advised patient to call the provider for changes in level or intensity of pain Provided education to patient re: alternative pain relief methods and pacing activities. 06-21-2020: Review of activities and the patient states going to the gym was helpful but she has not been in a couple of months. Education provided on the benefits of working out and strengthening back muscles. The patient will work on making it a priority to go back to the gym.  Reviewed medications with patient and discussed compliance. 06-21-2020: The patient is compliant with medications  Discussed plans with patient for ongoing care management follow up  and provided patient with direct contact information for care management team Provided patient with pain relief educational materials related to pain and discomfort of chronic back pain.  - mutually acceptable comfort goal set - pain assessed - pain management plan developed - pain treatment goals reviewed - patient response to treatment assessed - careful application of heat or ice encouraged - complementary therapy use encouraged - deep breathing, relaxation and mindfulness use promoted - effectiveness of pharmacologic therapy monitored - motivation and barriers to change assessed and addressed - mutually acceptable comfort goal set - participation in physical therapy encouraged - anxiety related to change in medications acknowledged - balance, gait and fall risk reviewed - quality of sleep assessed - safety plan developed - sleep hygiene techniques encouraged- patient endorses sleeping about 6 hours a night and feeling rested when waking  - strategies to improve or maintain safety promoted  Patient Goals/Self-Care Activities Over the next 120 days, patient will:  - Patient will self administer medications  as prescribed Patient will attend all scheduled provider appointments Patient will call provider office for new concerns or questions Patient will work with BSW to address care coordination needs and will continue to work with the clinical team to address health care and disease management related needs.    Follow Up Plan: Telephone follow up appointment with care management team member scheduled for: 12-06-2020 at 0945  am       Task: RNCM: Partner to Develop Chronic Pain Management Plan Completed 12/06/2020  Outcome: Positive  Note:   Care Management Activities:    - mutually acceptable comfort goal set - pain assessed - pain management plan developed - pain treatment goals reviewed - patient response to treatment assessed       Problem: RNCM: Chronic Pain Management (Chronic Pain) Resolved 12/06/2020     Problem: RNCM: Harm or Injury (Chronic Pain) Resolved 12/06/2020  Priority: High     Care Plan : RNCM: Management of Hypertension and HLD (Adult)  Updates made by [name], RN since 12/06/2020 12:00 AM  Completed 12/06/2020   Problem: RNCM: Hypertension and HLD Management  (Hypertension) Resolved 12/06/2020  Priority: Medium  Note:   The patient has HTN and HLD    Long-Range Goal: RNCM: Management of HTN and HLD Completed 12/06/2020  Expected End Date: 07/12/2021  Recent Progress: On track  Priority: Medium  Note:   Objective: resolving, duplicate goal Last practice recorded BP readings:  BP Readings from Last 3 Encounters:  08/27/20 140/77  07/09/20 122/70  06/05/20 122/70    Lab Results  Component Value Date   CHOL 111 08/27/2020   HDL 51 08/27/2020   LDLCALC 41 08/27/2020   TRIG 108 08/27/2020   CHOLHDL 2.2 08/27/2020    No results found for: LDLDIRECT    Takes Lipitor 40 mg QD  Most recent eGFR/CrCl: No results found for: EGFR  No components found for: CRCL Current Barriers:  Knowledge Deficits related to basic understanding of hypertension  and HLD pathophysiology and self care management Knowledge Deficits related to understanding of medications prescribed for management of hypertension and HLD Unable to independently manage chronic conditions of HTN and HLD Does not contact provider office for questions/concerns Case Manager Clinical Goal(s):  Over the next 120 days, patient will verbalize understanding of plan for hypertension management Over the next 120 days, patient will attend all scheduled medical appointments: saw pcp on 05-16-2020. Knows to call for changes in condition Over the next 120 days, patient will demonstrate  improved adherence to prescribed treatment plan for hypertension as evidenced by taking all medications as prescribed, monitoring and recording blood pressure as directed, adhering to low sodium/DASH diet Over the next 120 days, patient will demonstrate improved health management independence as evidenced by checking blood pressure as directed and notifying PCP if SBP>160 or DBP > 90, taking all medications as prescribe, and adhering to a low sodium diet as discussed. Interventions:  Evaluation of current treatment plan related to hypertension self management and patient's adherence to plan as established by provider. 06-21-2020: The patient is compliant with heart healthy diet, is working on going back to the gym to help with improving chronic conditions. Review of heart healthy/ADA restrictions as the patient is going out of town this weekend for a wedding. 09/06/2020:  Patient enjoyed family vacation and did not adhere to regular diet during the vacation.  Now she is back to her normal eating routine. Provided education to patient re: stroke prevention, s/s of heart attack and stroke, DASH diet, complications of uncontrolled blood pressure Reviewed medications with patient and discussed importance of compliance. 06-21-2020: The patient is compliant with medications regimen.  Discussed plans with patient for ongoing  care management follow up and provided patient with direct contact information for care management team Advised patient, providing education and rationale, to monitor blood pressure daily and record, calling PCP for findings outside established parameters.  Review of recent sinus/cold congestion. Took COVID test and the test was negative. 06-21-2020: Denies any issues related to cold and sinus issues. States she is still using her mask.  - blood pressure trends reviewed - depression screen reviewed - home or ambulatory blood pressure monitoring encouraged - healthy diet promoted - healthy family lifestyle promoted - medication side effects managed - pain assessed and managed - patient response to treatment assessed - quality of sleep assessed - reduction of dietary sodium encouraged - reduction in sedentary activities encouraged - response to pharmacologic therapy monitored - sleep hygiene techniques encouraged Patient Goals/Self-Care Activities Over the next 120 days, patient will:  - Calls provider office for new concerns, questions, or BP outside discussed parameters Checks BP and records as discussed Follows a low sodium diet/DASH diet Follow Up Plan: Telephone follow up appointment with care management team member scheduled for:  12-06-2020 at 0945 am    Task: RNCM: Identify and Monitor Blood Pressure Elevation Completed 12/06/2020  Outcome: Positive  Note:   Care Management Activities:    - blood pressure trends reviewed - depression screen reviewed - home or ambulatory blood pressure monitoring encouraged       Problem: RNCM: Disease Progression (Hypertension) Resolved 12/06/2020  Priority: Medium  Note:   See goal for HTN    Care Plan : RNCM: General Plan of Care (Adult) for Chronic Disease Management and Care Coordination Needs  Updates made by [name], RN since 12/06/2020 12:00 AM     Problem: RNCM: Development for plan of care for Chronic Disease  Management (HTN, HLD, DM, Chronic pain, osteoporosis)   Priority: High     Long-Range Goal: RNCM: Development for plan of care for Chronic Disease Management (HTN, HLD, DM, Chronic pain, osteoporosis)   Start Date: 12/06/2020  Expected End Date: 12/06/2021  Priority: High  Note:   Current Barriers:  Knowledge Deficits related to plan of care for management of HTN, HLD, DMII, and Chronic pain and osteoporosis   Chronic Disease Management support and education needs related to HTN, HLD, DMII, and Chronic pain and osteoporosis  RNCM Clinical Goal(s):  Patient will verbalize understanding of plan for management of HTN, HLD, DMII, Osteoporosis, and chronic pain  verbalize basic understanding of HTN, HLD, DMII, Osteoporosis, and chronic pain disease process and self health management plan   take all medications exactly as prescribed and will call provider for medication related questions demonstrate understanding of rationale for each prescribed medication   attend all scheduled medical appointments: 12-17-2020 at 0820 am demonstrate improved and ongoing adherence to prescribed treatment plan for HTN, HLD, DMII, Osteoporosis, and Chronic pain as evidenced by daily monitoring and recording of CBG  adherence to ADA/ carb modified diet adherence to prescribed medication regimen contacting provider for new or worsened symptoms or questions   demonstrate improved and ongoing health management independence   work with pharmacist to address DM management and medications needs related to DMII demonstrate a decrease in HTN, HLD, DMII, Osteoporosis, and chronic pain exacerbations   demonstrate ongoing self health care management ability for effective management of chronic conditions   through collaboration with RN Care manager, provider, and care team.   Interventions: 1:1 collaboration with primary care provider regarding development and update of comprehensive plan of care as evidenced by provider  attestation and co-signature Inter-disciplinary care team collaboration (see longitudinal plan of care) Evaluation of current treatment plan related to  self management and patient's adherence to plan as established by provider   SDOH Barriers (Status: Goal on track: YES.)  Patient interviewed and SDOH assessment performed        SDOH Interventions    Flowsheet Row Most Recent Value  SDOH Interventions   Food Insecurity Interventions Intervention Not Indicated  Financial Strain Interventions Other (Comment)  [patient assistance program for medications]  Housing Interventions Intervention Not Indicated  Intimate Partner Violence Interventions Intervention Not Indicated  Physical Activity Interventions Other (Comments)  [the patient is active and cleans houses, 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]  Stress Interventions Intervention Not Indicated  Social Connections Interventions Other (Comment)  [good support system]  Transportation Interventions Intervention Not Indicated     Patient interviewed and appropriate assessments performed Provided patient with information about resources available for SDOH needs  Discussed plans with patient for ongoing care management follow up and provided patient with direct contact information for care management team Advised patient to call the office for changes in [place], questions or concerns    Diabetes:  (Status: Goal on track: YES.) Lab Results  Component Value Date   HGBA1C 7.5 (H) 08/27/2020  Assessed patient's understanding of A1c goal: <7% Provided education to patient about basic DM disease process; Reviewed medications with patient and discussed importance of medication adherence;        Reviewed prescribed diet with patient heart healthy/ADA; Counseled on importance of regular laboratory monitoring as prescribed;        Discussed plans with patient for ongoing care management follow up and provided patient with direct contact  information for care management team;      Provided patient with written educational materials related to hypo and hyperglycemia and importance of correct treatment;       Reviewed scheduled/upcoming provider appointments including: 12-17-2020 at 0820 am;         Advised patient, providing education and rationale, to check cbg as directed  and record        call provider for findings outside established parameters. 12-06-2020: States her blood sugars are doing well and states they are in the 100's  Referral made to pharmacy team for assistance with medications assistance program and effective management of DM;       Review of patient status, including review of consultants reports, relevant laboratory and other test results, and medications completed;       Screening for signs and symptoms of depression related to chronic disease state;        Assessed social determinant of health barriers;         Osteoporosis  (Status: Goal on track: YES.) Evaluation of current treatment plan related to Osteoporosis,  self-management and patient's adherence to plan as established by provider. Discussed plans with patient for ongoing care management follow up and provided patient with direct contact information for care management team Advised patient to call the office for any falls, fractures, or changes in osteoporosis health; Provided education to patient re: safety, being careful and prevention of falls, notifying provider of changes in condtions; Reviewed medications with patient and discussed compliance; Reviewed scheduled/upcoming provider appointments including 12-17-2020 at 0820 am; Discussed plans with patient for ongoing care management follow up and provided patient with direct contact information for care management team;  Hyperlipidemia:  (Status: Goal on track: YES.) Lab Results  Component Value Date   CHOL 111 08/27/2020   HDL 51 08/27/2020   LDLCALC 41 08/27/2020   TRIG 108 08/27/2020    CHOLHDL 2.2 08/27/2020     Medication review performed; medication list updated in electronic medical record.  Provider established cholesterol goals reviewed; Counseled on importance of regular laboratory monitoring as prescribed; Provided HLD educational materials; Reviewed role and benefits of statin for ASCVD risk reduction; Discussed strategies to manage statin-induced myalgias; Reviewed importance of limiting foods high in cholesterol;  Hypertension: (Status: Goal on track: YES.) Last practice recorded BP readings:  BP Readings from Last 3 Encounters:  08/27/20 140/77  07/09/20 122/70  06/05/20 122/70  Most recent eGFR/CrCl:  Lab Results  Component Value Date   EGFR 95 08/27/2020    No components found for: CRCL  Evaluation of current treatment plan related to hypertension self management and patient's adherence to plan as established by provider;   Provided education to patient re: stroke prevention, s/s of heart attack and stroke; Reviewed prescribed diet heart healthy/ADA  Reviewed medications with patient and discussed importance of compliance;  Discussed plans with patient for ongoing care management follow up and provided patient with direct contact information for care management team; Advised patient, providing education and rationale, to monitor blood pressure daily and record, calling PCP for findings outside established parameters;  Reviewed scheduled/upcoming provider appointments including: 12-17-2020 at 0820 am  Pain:  (Status: Goal on track: NO.) Pain assessment performed Medications reviewed Reviewed provider established plan for pain management. 12-06-2020: The patient states her pain level is a 7 today on a scale of 0-10. 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 She plans on talking to the pcp about options for pain management on 12-17-2020. Heat application and Tylenol Arthritis are sometimes effective. She denies any  acute distress but knows she needs some kind of relief from her chronic back pain. Reminded the patient to write down questions to ask the pcp at her upcoming appointment. Will continue to monitor.  Discussed importance of adherence to all scheduled medical appointments; Counseled on the importance of reporting any/all new or changed pain symptoms or management strategies to pain management provider; Advised patient to report to care team affect of pain on daily activities; Discussed  use of relaxation techniques and/or diversional activities to assist with pain reduction (distraction, imagery, relaxation, massage, acupressure, TENS, heat, and cold application; Reviewed with patient prescribed pharmacological and nonpharmacological pain relief strategies; Advised patient to discuss unrelieved pain, changes in intensity and level of pain with provider; Screening for signs and symptoms of depression related to chronic disease state;  Assessed social determinant of health barriers;   Patient Goals/Self-Care Activities: Patient will self administer medications as prescribed as evidenced by self report/primary caregiver report  Patient will attend all scheduled provider appointments as evidenced by clinician review of documented attendance to scheduled appointments and patient/caregiver report Patient will call pharmacy for medication refills as evidenced by patient report and review of pharmacy fill history as appropriate Patient will attend church or other social activities as evidenced by patient report Patient will continue to perform ADL's independently as evidenced by patient/caregiver report Patient will continue to perform IADL's independently as evidenced by patient/caregiver report Patient will call provider office for new concerns or questions as evidenced by review of documented incoming telephone call notes and patient report Patient will work with BSW to address care coordination needs and  will continue to work with the clinical team to address health care and disease management related needs as evidenced by documented adherence to scheduled care management/care coordination appointments - schedule appointment with eye doctor - check blood sugar at prescribed times: before meals and at bedtime, when you have symptoms of low or high blood sugar, and before and after exercise - check feet daily for cuts, sores or redness - enter blood sugar readings and medication or insulin into daily log - take the blood sugar log to all doctor visits - trim toenails straight across - drink 6 to 8 glasses of water each day - eat fish at least once per week - fill half of plate with vegetables - keep a food diary - limit fast food meals to no more than 1 per week - manage portion size - prepare main meal at home 3 to 5 days each week - read food labels for fat, fiber, carbohydrates and portion size - reduce red meat to 2 to 3 times a week - switch to sugar-free drinks - keep feet up while sitting - wash and dry feet carefully every day - wear comfortable, cotton socks - wear comfortable, well-fitting shoes - check blood pressure weekly - choose a place to take my blood pressure (home, clinic or office, retail store) - write blood pressure results in a log or diary - learn about high blood pressure - keep a blood pressure log - take blood pressure log to all doctor appointments - call doctor for signs and symptoms of high blood pressure - develop an action plan for high blood pressure - keep all doctor appointments - take medications for blood pressure exactly as prescribed - report new symptoms to your doctor - eat more whole grains, fruits and vegetables, lean meats and healthy fats - call for medicine refill 2 or 3 days before it runs out - take all medications exactly as prescribed - call doctor with any symptoms you believe are related to your medicine - call doctor when you  experience any new symptoms - go to all doctor appointments as scheduled - adhere to prescribed diet: heart healthy/ADA diet       Plan:Telephone follow up appointment with care management team member scheduled for:  01-24-2021 at [place] am  Noreene Larsson RN, MSN, [place] Coordinator  [facility] Mobile: [phone]

## 2020-12-17 ENCOUNTER — Ambulatory Visit: Payer: PPO | Admitting: Internal Medicine

## 2020-12-17 ENCOUNTER — Ambulatory Visit (INDEPENDENT_AMBULATORY_CARE_PROVIDER_SITE_OTHER): Payer: PPO | Admitting: Internal Medicine

## 2020-12-17 ENCOUNTER — Encounter: Payer: Self-pay | Admitting: Internal Medicine

## 2020-12-17 ENCOUNTER — Ambulatory Visit: Payer: Self-pay

## 2020-12-17 ENCOUNTER — Other Ambulatory Visit: Payer: Self-pay

## 2020-12-17 VITALS — BP 130/86 | HR 76 | Temp 97.6°F | Resp 18 | Ht 62.0 in | Wt 161.8 lb

## 2020-12-17 DIAGNOSIS — E1165 Type 2 diabetes mellitus with hyperglycemia: Secondary | ICD-10-CM | POA: Diagnosis not present

## 2020-12-17 DIAGNOSIS — N3946 Mixed incontinence: Secondary | ICD-10-CM | POA: Insufficient documentation

## 2020-12-17 DIAGNOSIS — M25551 Pain in right hip: Secondary | ICD-10-CM

## 2020-12-17 DIAGNOSIS — G8929 Other chronic pain: Secondary | ICD-10-CM

## 2020-12-17 DIAGNOSIS — M25561 Pain in right knee: Secondary | ICD-10-CM | POA: Diagnosis not present

## 2020-12-17 DIAGNOSIS — M810 Age-related osteoporosis without current pathological fracture: Secondary | ICD-10-CM

## 2020-12-17 DIAGNOSIS — M170 Bilateral primary osteoarthritis of knee: Secondary | ICD-10-CM | POA: Diagnosis not present

## 2020-12-17 DIAGNOSIS — Z6829 Body mass index (BMI) 29.0-29.9, adult: Secondary | ICD-10-CM | POA: Diagnosis not present

## 2020-12-17 DIAGNOSIS — Z794 Long term (current) use of insulin: Secondary | ICD-10-CM | POA: Diagnosis not present

## 2020-12-17 DIAGNOSIS — I10 Essential (primary) hypertension: Secondary | ICD-10-CM

## 2020-12-17 DIAGNOSIS — M25562 Pain in left knee: Secondary | ICD-10-CM

## 2020-12-17 DIAGNOSIS — M545 Low back pain, unspecified: Secondary | ICD-10-CM | POA: Diagnosis not present

## 2020-12-17 DIAGNOSIS — E663 Overweight: Secondary | ICD-10-CM

## 2020-12-17 DIAGNOSIS — Z0001 Encounter for general adult medical examination with abnormal findings: Secondary | ICD-10-CM

## 2020-12-17 DIAGNOSIS — E782 Mixed hyperlipidemia: Secondary | ICD-10-CM | POA: Diagnosis not present

## 2020-12-17 DIAGNOSIS — M25552 Pain in left hip: Secondary | ICD-10-CM

## 2020-12-17 NOTE — Assessment & Plan Note (Signed)
Encouraged her to take Tylenol Arthritis 650 mg ER 1 tab PO BID

## 2020-12-17 NOTE — Patient Instructions (Signed)
Ejercicios de Scientist, research (physical sciences) Los ejercicios de [place]el pueden ayudar a fortalecer los msculos del suelo plvico. El suelo plvico est formado por un grupo de msculos que sostienen el recto, el intestino delgado y la vejiga. En las mujeres, los msculos del suelo plvico tambin sostienen el tero. Estos msculos ayudan a controlar el flujo de [place] y materia fecal (heces). Los ejercicios de Kegel son indoloros y simples, y no requieren ningn equipo. El mdico puede sugerir ejercicios de Kegel para: Mejorar el control de la vejiga y los intestinos. Mejorar la respuesta sexual. Mejorar los msculos dbiles del suelo plvico despus de una ciruga para extirpar el tero (histerectoma) o de un embarazo (mujeres). Mejorar los msculos del suelo plvico dbiles despus de la extirpacin o ciruga de la prstata (hombres). Los ejercicios de Kegel consisten en contraer los msculos del suelo plvico, que son los mismos que contrae al intentar detener el flujo de la orina o evitar eliminar gases. Estos ejercicios se pueden Regulatory affairs officer est sentado, parado o [place], pero lo mejor es variar la posicin. Ejercicios Cmo hacer los ejercicios de Kegel: Contraiga con fuerza los msculos del suelo plvico. Debe sentir que el rea rectal se eleva y se tensa. Si es [place], tambin debe sentir tensin en el rea vaginal. Mantenga el [place], las nalgas y las piernas [place]. Mantenga los msculos tensos durante 10 segundos como mximo. Respire con normalidad. Relaje los msculos. Repita como se lo haya indicado el mdico. Repita este ejercicio a diario como se lo haya indicado el mdico. Contine haciendo este ejercicio durante al menos 4 a 6 semanas o el tiempo que le haya indicado su mdico. Pueden derivarlo a un fisioterapeuta que lo puede ayudar a aprender ms sobre cmo hacer los ejercicios de Kegel. Segn sea su estado, el mdico puede recomendarle lo siguiente: Variar cunto [employer]. Hacer varias series de ejercicios [employer]. Hacer ejercicios durante varias semanas. Convertir los ejercicios de Kegel en parte de su rutina de ejercicios habitual. Esta informacin no tiene Marine scientist el consejo del mdico. Asegrese de hacerle al mdico cualquier pregunta que tenga. Document Revised: 06/10/2019 Document Reviewed: 11/04/2017 Elsevier Patient Education  2022 [employer].

## 2020-12-17 NOTE — Chronic Care Management (AMB) (Signed)
Chronic Care Management   CCM RN Visit Note  12/17/2020 Name: Summer Young MRN: [id number] DOB: Aug 13, 1950  Subjective: Summer Young is a 70 y.o. year old female who is a primary care patient of [name], NP. The care management team was consulted for assistance with disease management and care coordination needs.    Engaged with patient face to face for follow up visit in response to provider referral for case management and/or care coordination services.   Consent to Services:  The patient was given information about Chronic Care Management services, agreed to services, and gave verbal consent prior to initiation of services.  Please see initial visit note for detailed documentation.   Patient agreed to services and verbal consent obtained.   Assessment: Review of patient past medical history, allergies, medications, health status, including review of consultants reports, laboratory and other test data, was performed as part of comprehensive evaluation and provision of chronic care management services.   SDOH (Social Determinants of Health) assessments and interventions performed:    CCM Care Plan  Allergies  Allergen Reactions   Aspirin Hives    Other reaction(s): Other (See Comments) Other Reaction: Not Assessed    Insulin Detemir Hives and Rash    Errythema, edema, heat at site of injection did not improve after 2 weeks of use.     Shellfish Allergy Anaphylaxis   Dapagliflozin Other (See Comments)    Yeast infections Yeast infection Farxiga   Influenza Vaccines Other (See Comments)    Forxiga   Other     Other reaction(s): Other (See Comments) Uncoded Allergy. Allergen: Shellfish, Other Reaction: Not Assessed    Flu vaccine given 1998 caused anaphylaxis    Outpatient Encounter Medications as of 12/17/2020  Medication Sig Note   acetaminophen (TYLENOL) 650 MG CR tablet Take 650 mg by mouth daily as needed for pain.    alendronate (FOSAMAX) 70 MG tablet Take 1  tablet (70 mg total) by mouth once a week. Take with a full glass of water on an empty stomach.    Ascorbic Acid (VITAMIN C) 1000 MG tablet Take 1,000 mg by mouth daily as needed.  09/20/2018: As needed to prevent illness   atorvastatin (LIPITOR) 40 MG tablet Take 1 tablet by mouth once daily    Biotin 1000 MCG tablet Take 4,000 mcg by mouth every other day.  01/19/2018: Takes every other day   blood glucose meter kit and supplies Dispense based on patient and insurance preference. Use up to four times daily as directed. (FOR ICD-10 E10.9, E11.9).    Cholecalciferol (VITAMIN D3) 50 MCG (2000 UT) capsule Take 1,000 Units by mouth daily.     insulin degludec (TRESIBA FLEXTOUCH) 100 UNIT/ML SOPN FlexTouch Pen Inject 0.26 mLs (26 Units total) into the skin daily. (Patient taking differently: Inject 20 Units into the skin daily.)    metFORMIN (GLUCOPHAGE-XR) 500 MG 24 hr tablet TAKE 2 TABLETS BY MOUTH ONCE DAILY WITH BREAKFAST    ONETOUCH ULTRA test strip Check blood sugar 1 x daily as directed    polyethylene glycol (MIRALAX / GLYCOLAX) 17 g packet Take 17 g by mouth daily as needed.    Semaglutide, 1 MG/DOSE, (OZEMPIC, 1 MG/DOSE,) 2 MG/1.5ML SOPN INJECT 1 MG SUBCUTANEOUSLY ONCE A WEEK    spironolactone (ALDACTONE) 25 MG tablet Take 1 tablet by mouth once daily    vitamin B-12 (CYANOCOBALAMIN) 1000 MCG tablet Take 1,000 mcg by mouth daily.     No facility-administered encounter medications on file  as of 12/17/2020.    Patient Active Problem List   Diagnosis Date Noted   Mixed incontinence 12/17/2020   Overweight with body mass index (BMI) of 29 to 29.9 in adult 08/28/2020   Osteoporosis 08/16/2019   Chronic bilateral low back pain without sciatica 11/22/2014   Chronic pain of both knees 11/22/2014   Essential hypertension 11/22/2014   Dyslipidemia associated with type 2 diabetes mellitus ([place]) 11/22/2014   Type 2 diabetes mellitus with hyperglycemia ([place]) 01/19/2013    Conditions to be  addressed/monitored:HTN, HLD, DMII, Osteoporosis, and Chronic Pain   Care Plan : RNCM: General Plan of Care (Adult) for Chronic Disease Management and Care Coordination Needs  Updates made by [name], RN since 12/17/2020 12:00 AM     Problem: RNCM: Development for plan of care for Chronic Disease Management (HTN, HLD, DM, Chronic pain, osteoporosis)   Priority: High     Long-Range Goal: RNCM: Development for plan of care for Chronic Disease Management (HTN, HLD, DM, Chronic pain, osteoporosis)   Start Date: 12/06/2020  Expected End Date: 12/06/2021  Priority: High  Note:   Current Barriers:  Knowledge Deficits related to plan of care for management of HTN, HLD, DMII, and Chronic pain and osteoporosis   Chronic Disease Management support and education needs related to HTN, HLD, DMII, and Chronic pain and osteoporosis   RNCM Clinical Goal(s):  Patient will verbalize understanding of plan for management of HTN, HLD, DMII, Osteoporosis, and chronic pain  verbalize basic understanding of HTN, HLD, DMII, Osteoporosis, and chronic pain disease process and self health management plan  take all medications exactly as prescribed and will call provider for medication related questions demonstrate understanding of rationale for each prescribed medication  attend all scheduled medical appointments: 12-17-2020 at 0820 am, saw pcp today. Awaiting results of lab work and then will be scheduled for a follow up. demonstrate improved and ongoing adherence to prescribed treatment plan for HTN, HLD, DMII, Osteoporosis, and Chronic pain as evidenced by daily monitoring and recording of CBG  adherence to ADA/ carb modified diet adherence to prescribed medication regimen contacting provider for new or worsened symptoms or questions  demonstrate improved and ongoing health management independence  work with pharmacist to address DM management and medications needs related to DMII demonstrate a decrease in  HTN, HLD, DMII, Osteoporosis, and chronic pain exacerbations  demonstrate ongoing self health care management ability for effective management of chronic conditions  through collaboration with RN Care manager, provider, and care team.   Interventions: 1:1 collaboration with primary care provider regarding development and update of comprehensive plan of care as evidenced by provider attestation and co-signature Inter-disciplinary care team collaboration (see longitudinal plan of care) Evaluation of current treatment plan related to  self management and patient's adherence to plan as established by provider   SDOH Barriers (Status: Goal on track: YES.)  Patient interviewed and SDOH assessment performed        SDOH Interventions    Flowsheet Row Most Recent Value  SDOH Interventions   Food Insecurity Interventions Intervention Not Indicated  Financial Strain Interventions Other (Comment)  [patient assistance program for medications]  Housing Interventions Intervention Not Indicated  Intimate Partner Violence Interventions Intervention Not Indicated  Physical Activity Interventions Other (Comments)  [the patient is active and cleans houses, 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]  Stress Interventions Intervention Not Indicated  Social Connections Interventions Other (Comment)  [good support system]  Transportation Interventions Intervention Not Indicated  Patient interviewed and appropriate assessments performed Provided patient with information about resources available for SDOH needs  Discussed plans with patient for ongoing care management follow up and provided patient with direct contact information for care management team Advised patient to call the office for changes in SDOH, questions or concerns    Diabetes:  (Status: Goal on track: YES.) Lab Results  Component Value Date   HGBA1C 7.5 (H) 08/27/2020  12-17-2020: Pending new results of new HGBA1C today in the office.   Assessed patient's understanding of A1c goal: <7% Provided education to patient about basic DM disease process; Reviewed medications with patient and discussed importance of medication adherence;        Reviewed prescribed diet with patient heart healthy/ADA. 12-17-2020: Supplied the patient with Glucerna samples to try to help with adding nutrients to her diet ; Counseled on importance of regular laboratory monitoring as prescribed. 12-17-2020: The patient has pending lab work today;        Discussed plans with patient for ongoing care management follow up and provided patient with direct contact information for care management team;      Provided patient with written educational materials related to hypo and hyperglycemia and importance of correct treatment;       Reviewed scheduled/upcoming provider appointments including: 12-17-2020 at 0820 am, has completed appointment with pcp, knows to call for new concerns or needs, will follow up with the pcp in 6 months.          Advised patient, providing education and rationale, to check cbg as directed  and record. 12-17-2020:The patient is compliant with recommendations to check blood sugars       call provider for findings outside established parameters. 12-17-2020: States her blood sugars are doing well and states they are in the 100's       Referral made to pharmacy team for assistance with medications assistance program and effective management of DM;       Review of patient status, including review of consultants reports, relevant laboratory and other test results, and medications completed;       Screening for signs and symptoms of depression related to chronic disease state;        Assessed social determinant of health barriers;         Osteoporosis  (Status: Goal on track: YES.) Evaluation of current treatment plan related to Osteoporosis,  self-management and patient's adherence to plan as established by provider. Discussed plans with  patient for ongoing care management follow up and provided patient with direct contact information for care management team Advised patient to call the office for any falls, fractures, or changes in osteoporosis health; Provided education to patient re: safety, being careful and prevention of falls, notifying provider of changes in condtions; Reviewed medications with patient and discussed compliance. 12-17-2020: The patient is compliant with medications, denies any new concerns; Reviewed scheduled/upcoming provider appointments including 12-17-2020 at [place] am, Appointment completed today ; Discussed plans with patient for ongoing care management follow up and provided patient with direct contact information for care management team;  Hyperlipidemia:  (Status: Goal on track: YES.) Lab Results  Component Value Date   CHOL 111 08/27/2020   HDL 51 08/27/2020   LDLCALC 41 08/27/2020   TRIG 108 08/27/2020   CHOLHDL 2.2 08/27/2020   New lab work pending from today. Will review results when they are available  Medication review performed; medication list updated in electronic medical record.  Provider established cholesterol goals reviewed; Counseled on  importance of regular laboratory monitoring as prescribed; Provided HLD educational materials; Reviewed role and benefits of statin for ASCVD risk reduction; Discussed strategies to manage statin-induced myalgias; Reviewed importance of limiting foods high in cholesterol. 12-17-2020: Discussed heart healthy/ADA diet. The patient eats chicken but not a lot of other meats. She likes vegetables and is considering adding more beans to her dietary intake. Discussed foods low cholesterol options;  Hypertension: (Status: Goal on track: YES.) Last practice recorded BP readings:  BP Readings from Last 3 Encounters:  12/17/20 130/86  08/27/20 140/77  07/09/20 122/70  Most recent eGFR/CrCl:  Lab Results  Component Value Date   EGFR 95 08/27/2020     No components found for: CRCL  Evaluation of current treatment plan related to hypertension self management and patient's adherence to plan as established by provider. 12-17-2020: The patient feels she is doing well with management of her HTN. Denies any headache pain. She is normotensive in the office today. ;   Provided education to patient re: stroke prevention, s/s of heart attack and stroke; Reviewed prescribed diet heart healthy/ADA.  12-17-2020: The patient is compliant with heart healthy/ADA diet. Glucerna samples provided today in the office.  Reviewed medications with patient and discussed importance of compliance. 12-17-2020: The patient endorses compliance with medications.   Discussed plans with patient for ongoing care management follow up and provided patient with direct contact information for care management team; Advised patient, providing education and rationale, to monitor blood pressure daily and record, calling PCP for findings outside established parameters;  Reviewed scheduled/upcoming provider appointments including: 12-17-2020 at 0820 am- completed this appointment. Will have blood work and will follow up with the pcp in 6 months.  Pain:  (Status: Goal on track: NO.) Pain assessment performed. 12-17-2020: The patient in the office today to follow up with pcp for chronic back pain and discomfort. Recommendations provided to take Tylenol Arthritis 650 mg BID.  The patient states this is the biggest issue she has right now. She states she has to work to pay her bills. She says when she has to do the physical labor this exacerbates her pain in her back. Education and support given.  Medications reviewed. 12-17-2020: The pcp recommended the patient take Tylenol Arthritis BID for pain relief and discomfort. Reviewed provider established plan for pain management. 12-06-2020: The patient states her pain level is a 7 today on a scale of 0-10. 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 She plans on talking to the pcp about options for pain management on 12-17-2020. Heat application and Tylenol Arthritis are sometimes effective. She denies any acute distress but knows she needs some kind of relief from her chronic back pain. Reminded the patient to write down questions to ask the pcp at her upcoming appointment. Will continue to monitor. 12-17-2020: Discussed pacing her activity and resting when she needs to rest. The patient states that she feels great other than the back pain. She is going to start taking Tylenol Arthritis 650 mg BID to see how this works for her. She does lay down when the pain is intense. Discussed limiting physical activity when pain is at its worse. Empathetic listening and support given.  Discussed importance of adherence to all scheduled medical appointments; Counseled on the importance of reporting any/all new or changed pain symptoms or management strategies to pain management provider; Advised patient to report to care team affect of pain on daily activities; Discussed use of relaxation  techniques and/or diversional activities to assist with pain reduction (distraction, imagery, relaxation, massage, acupressure, TENS, heat, and cold application; Reviewed with patient prescribed pharmacological and nonpharmacological pain relief strategies; Advised patient to discuss unrelieved pain, changes in intensity and level of pain with provider; Screening for signs and symptoms of depression related to chronic disease state;  Assessed social determinant of health barriers;   Patient Goals/Self-Care Activities: Patient will self administer medications as prescribed as evidenced by self report/primary caregiver report  Patient will attend all scheduled provider appointments as evidenced by clinician review of documented attendance to scheduled appointments and patient/caregiver report Patient will call pharmacy for medication  refills as evidenced by patient report and review of pharmacy fill history as appropriate Patient will attend church or other social activities as evidenced by patient report Patient will continue to perform ADL's independently as evidenced by patient/caregiver report Patient will continue to perform IADL's independently as evidenced by patient/caregiver report Patient will call provider office for new concerns or questions as evidenced by review of documented incoming telephone call notes and patient report Patient will work with BSW to address care coordination needs and will continue to work with the clinical team to address health care and disease management related needs as evidenced by documented adherence to scheduled care management/care coordination appointments - schedule appointment with eye doctor - check blood sugar at prescribed times: before meals and at bedtime, when you have symptoms of low or high blood sugar, and before and after exercise - check feet daily for cuts, sores or redness - enter blood sugar readings and medication or insulin into daily log - take the blood sugar log to all doctor visits - trim toenails straight across - drink 6 to 8 glasses of water each day - eat fish at least once per week - fill half of plate with vegetables - keep a food diary - limit fast food meals to no more than 1 per week - manage portion size - prepare main meal at home 3 to 5 days each week - read food labels for fat, fiber, carbohydrates and portion size - reduce red meat to 2 to 3 times a week - switch to sugar-free drinks - keep feet up while sitting - wash and dry feet carefully every day - wear comfortable, cotton socks - wear comfortable, well-fitting shoes - check blood pressure weekly - choose a place to take my blood pressure (home, clinic or office, retail store) - write blood pressure results in a log or diary - learn about high blood pressure - keep a blood pressure  log - take blood pressure log to all doctor appointments - call doctor for signs and symptoms of high blood pressure - develop an action plan for high blood pressure - keep all doctor appointments - take medications for blood pressure exactly as prescribed - report new symptoms to your doctor - eat more whole grains, fruits and vegetables, lean meats and healthy fats - call for medicine refill 2 or 3 days before it runs out - take all medications exactly as prescribed - call doctor with any symptoms you believe are related to your medicine - call doctor when you experience any new symptoms - go to all doctor appointments as scheduled - adhere to prescribed diet: heart healthy/ADA diet       Plan:Telephone follow up appointment with care management team member scheduled for:  01-24-2021 at [place] am  Noreene Larsson RN, MSN, [place]  [facility] Mobile: [phone]

## 2020-12-17 NOTE — Patient Instructions (Signed)
Visit Information  PATIENT GOALS:  Goals Addressed             This Visit's Progress    RNCM: Manage Chronic Pain       Timeframe:  Long-Range Goal Priority:  High Start Date:    12-06-2020                         Expected End Date:   12-06-2021                    Follow Up Date 01/24/2021    - bring pain diary to all appointments - call for medicine refill 2 or 3 days before it runs out - develop a personal pain management plan - keep track of prescription refills - plan exercise or activity when pain is best controlled - prioritize tasks for the day - start a pain diary - track times pain is worst and when it is best - track what makes the pain worse and what makes it better - use ice or heat for pain relief - work slower and less intense when having pain    Why is this important?   Day-to-day life can be hard when you have chronic pain.  Pain medicine is just one piece of the treatment puzzle.  You can try these action steps to help you manage your pain.    12-06-2020: The patient rates her pain level of a 7 today on a scale of 0-10. The patient states that this is her biggest concern. The patient states that she is going to talk to the pcp about this on 12-17-2020 at her appointment. The patient is taking Tylenol arthritis and using a heating pad. This helps most days. 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 Education and support given. 12-17-2020: The patient saw the pcp today and recommendations given for the patient to take Tylenol Arteritis 650 mg BID for pain relief. The patient states that if she were not having the pain she would be doing very good. Education and support given.      RNCM: Prevent Falls and Broken Bones-Osteoporosis       Timeframe:  Long-Range Goal Priority:  High Start Date:    12-06-2020                         Expected End Date:      12-06-2021                 Follow Up Date 01-24-2021   - add more outdoor  lighting - always use handrails on the stairs - always wear shoes or slippers with non-slip sole - get at least 10 minutes of activity every day - install bathroom grab bars - keep a flashlight by the bed - keep cell phone with me always - make an emergency alert plan in case I fall - pick up clutter from the floors - remove, or use a non-slip pad, with my throw rugs - use a cane or walker - use a nightlight in the bathroom - wear low heeled or flat shoes with non-skid soles    Why is this important?   When you fall, there are 3 things that control if a bone breaks or not.  These are the fall itself, how hard and the direction that you fall and how fragile your bones are.  Preventing falls is very important  for you because of fragile bones.     12-06-2020: The patient is careful and knows when to pace her activities. Denies any issues with falls at this time. Biggest concern is chronic back pain and discomfort10-31-2022: Denies any falls. Encouraged the patient to pace her activities. Denies any acute findings. Will continue to monitor.         Patient verbalizes understanding of instructions provided today and agrees to view in [place].   Telephone follow up appointment with care management team member scheduled for: 01-24-2021 at [place] am  Noreene Larsson RN, MSN, [place] Mobile: [phone]

## 2020-12-17 NOTE — Assessment & Plan Note (Signed)
Encouraged diet and exercise for weight loss ?

## 2020-12-17 NOTE — Assessment & Plan Note (Signed)
RX for blood pressure cuff given

## 2020-12-17 NOTE — Assessment & Plan Note (Signed)
Handout given on Kegel exercises Could consider Pelvic floor PT if symptoms persist

## 2020-12-17 NOTE — Assessment & Plan Note (Signed)
A1C and urine microalbumin today Encouraged her to consume a low carb diet and exercise for weight loss Continue current meds Encouraged routine eye exams, will request copy of recent eye exam Encouraged routine foot exams She declines flu shot Encouraged her to get her covid booster Pneumovax and Prevnar UTD, will request copy from pharmacy

## 2020-12-17 NOTE — Progress Notes (Signed)
Subjective:    Patient ID: Summer Young, female    DOB: [birth year], 70 y.o.   MRN: [id number]  HPI  Pt presents to the clinic today for her annual exam.  Flu: never Tetanus: 06/2017 Covid: Pfizer x 3 Pneumovax: 09/2008 Prevnar: 11/2011 Shingrix: 10/2015 Pap Smear: 08/2018 Mammogram: > 2 years ago Bone Density: 07/2019 Colon Screening: 02/2018 Vision Screening: annually Dentist: biannually  Diet: She does eat some meat. She does eat fruits and veggies. She tries to avoid fried foods. She drinks mostly water, passion fruit. Exercise: Gym    Review of Systems     Past Medical History:  Diagnosis Date   Allergy    Chronic pain of both knees    Diabetes mellitus without complication (HCC)    Hyperlipidemia    Hypertension     Current Outpatient Medications  Medication Sig Dispense Refill   acetaminophen (TYLENOL) 500 MG tablet Take 650 mg by mouth 2 (two) times daily as needed.     alendronate (FOSAMAX) 70 MG tablet Take 1 tablet (70 mg total) by mouth once a week. Take with a full glass of water on an empty stomach. 12 tablet 1   Ascorbic Acid (VITAMIN C) 1000 MG tablet Take 1,000 mg by mouth daily as needed.      atorvastatin (LIPITOR) 40 MG tablet Take 1 tablet by mouth once daily 90 tablet 0   Biotin 1000 MCG tablet Take 4,000 mcg by mouth every other day.      blood glucose meter kit and supplies Dispense based on patient and insurance preference. Use up to four times daily as directed. (FOR ICD-10 E10.9, E11.9). 1 each 0   Cholecalciferol (VITAMIN D3) 50 MCG (2000 UT) capsule Take 1,000 Units by mouth daily.      insulin degludec (TRESIBA FLEXTOUCH) 100 UNIT/ML SOPN FlexTouch Pen Inject 0.26 mLs (26 Units total) into the skin daily. (Patient taking differently: Inject 20 Units into the skin daily.) 3 pen 5   metFORMIN (GLUCOPHAGE-XR) 500 MG 24 hr tablet TAKE 2 TABLETS BY MOUTH ONCE DAILY WITH BREAKFAST 180 tablet 3   ONETOUCH ULTRA test strip Check blood sugar 1 x daily  as directed 100 each 12   polyethylene glycol (MIRALAX / GLYCOLAX) 17 g packet Take 17 g by mouth daily as needed. (Patient not taking: Reported on 10/29/2020)     Semaglutide, 1 MG/DOSE, (OZEMPIC, 1 MG/DOSE,) 2 MG/1.5ML SOPN INJECT 1 MG SUBCUTANEOUSLY ONCE A WEEK 4 mL 0   Skin Protectants, Misc. (AMERIDERM PERISHIELD) 3.8 % OINT Apply to skin under breast as needed 100 g 5   spironolactone (ALDACTONE) 25 MG tablet Take 1 tablet by mouth once daily 90 tablet 0   vitamin B-12 (CYANOCOBALAMIN) 1000 MCG tablet Take 1,000 mcg by mouth daily.      No current facility-administered medications for this visit.    Allergies  Allergen Reactions   Aspirin Hives    Other reaction(s): Other (See Comments) Other Reaction: Not Assessed    Insulin Detemir Hives and Rash    Errythema, edema, heat at site of injection did not improve after 2 weeks of use.     Shellfish Allergy Anaphylaxis   Dapagliflozin Other (See Comments)    Yeast infections Yeast infection Farxiga   Influenza Vaccines Other (See Comments)    Forxiga   Other     Other reaction(s): Other (See Comments) Uncoded Allergy. Allergen: Shellfish, Other Reaction: Not Assessed    Flu vaccine given 1998 caused anaphylaxis  Family History  Problem Relation Age of Onset   Diabetes Mother    Diabetes Father    Stroke Father    Diabetes Sister    Heart attack Other    Diabetes Sister    Diabetes Sister    Diabetes Sister     Social History   Socioeconomic History   Marital status: Married    Spouse name: Not on file   Number of children: Not on file   Years of education: Not on file   Highest education level: High school graduate  Occupational History   Occupation: housekeeper    Comment: self-employed  Tobacco Use   Smoking status: Never   Smokeless tobacco: Never  Vaping Use   Vaping Use: Never used  Substance and Sexual Activity   Alcohol use: Yes    Alcohol/week: 0.0 - 1.0 standard drinks    Comment: occasional    Drug use: Never   Sexual activity: Yes    Birth control/protection: None  Other Topics Concern   Not on file  Social History Narrative   Not on file   Social Determinants of Health   Financial Resource Strain: Low Risk    Difficulty of Paying Living Expenses: Not hard at all  Food Insecurity: No Food Insecurity   Worried About Charity fundraiser in the Last Year: Never true   [place] in the Last Year: Never true  Transportation Needs: No Transportation Needs   Lack of Transportation (Medical): No   Lack of Transportation (Non-Medical): No  Physical Activity: Inactive   Days of Exercise per Week: 0 days   Minutes of Exercise per Session: 0 min  Stress: No Stress Concern Present   Feeling of Stress : Not at all  Social Connections: Moderately Isolated   Frequency of Communication with Friends and Family: More than three times a week   Frequency of Social Gatherings with Friends and Family: More than three times a week   Attends Religious Services: Never   Marine scientist or Organizations: No   Attends Music therapist: Never   Marital Status: Married  Human resources officer Violence: Not At Risk   Fear of Current or Ex-Partner: No   Emotionally Abused: No   Physically Abused: No   Sexually Abused: No     Constitutional: Denies fever, malaise, fatigue, headache or abrupt weight changes.  HEENT: Denies eye pain, eye redness, ear pain, ringing in the ears, wax buildup, runny nose, nasal congestion, bloody nose, or sore throat. Respiratory: Denies difficulty breathing, shortness of breath, cough or sputum production.   Cardiovascular: Denies chest pain, chest tightness, palpitations or swelling in the hands or feet.  Gastrointestinal: Denies abdominal pain, bloating, constipation, diarrhea or blood in the stool.  GU: Pt reports mixed incontinence. Denies urgency, frequency, pain with urination, burning sensation, blood in urine, odor or  discharge. Musculoskeletal: Pt reports chronic back and knee pain. Denies decrease in range of motion, difficulty with gait, muscle pain or joint swelling.  Skin: Denies redness, rashes, lesions or ulcercations.  Neurological: Denies dizziness, difficulty with memory, difficulty with speech or problems with balance and coordination.  Psych: Denies anxiety, depression, SI/HI.  No other specific complaints in a complete review of systems (except as listed in HPI above).  Objective:   Physical Exam  BP 130/86 (BP Location: Right Arm, Patient Position: Sitting, Cuff Size: Normal)   Pulse 76   Temp 97.6 F (36.4 C)   Resp 18  Ht _0  (1.575 m)   Wt 161 lb 12.8 oz (73.4 kg)   SpO2 100%   BMI 29.59 kg/m   Wt Readings from Last 3 Encounters:  08/27/20 162 lb 6.4 oz (73.7 kg)  07/09/20 161 lb 12.8 oz (73.4 kg)  06/05/20 163 lb 6.4 oz (74.1 kg)    General: Appears her stated age, overweight, in NAD. Skin: Warm, dry and intact. No ulcerations noted. HEENT: Head: normal shape and size; Eyes: EOMs intact;  Neck:  Neck supple, trachea midline. No masses, lumps or thyromegaly present.  Cardiovascular: Normal rate and rhythm. S1,S2 noted.  No murmur, rubs or gallops noted. No JVD or BLE edema. No carotid bruits noted. Pulmonary/Chest: Normal effort and positive vesicular breath sounds. No respiratory distress. No wheezes, rales or ronchi noted.  Abdomen: Soft and nontender. Hypoactive bowel sounds. No distention or masses noted. Liver, spleen and kidneys non palpable. Musculoskeletal: Strength 5/5 BUE/BLE. No difficulty with gait.  Neurological: Alert and oriented. Cranial nerves II-XII grossly intact. Coordination normal.  Psychiatric: Mood and affect normal. Behavior is normal. Judgment and thought content normal.    BMET    Component Value Date/Time   NA 140 08/27/2020 1533   K 4.2 08/27/2020 1533   CL 106 08/27/2020 1533   CO2 26 08/27/2020 1533   GLUCOSE 102 08/27/2020 1533    BUN 9 08/27/2020 1533   CREATININE 0.68 08/27/2020 1533   CALCIUM 9.6 08/27/2020 1533   GFRNONAA 79 06/10/2018 0824   GFRAA 91 06/10/2018 0824    Lipid Panel     Component Value Date/Time   CHOL 111 08/27/2020 1533   TRIG 108 08/27/2020 1533   HDL 51 08/27/2020 1533   CHOLHDL 2.2 08/27/2020 1533   LDLCALC 41 08/27/2020 1533    CBC    Component Value Date/Time   WBC 8.6 04/12/2019 0818   RBC 5.05 04/12/2019 0818   HGB 13.9 04/12/2019 0818   HCT 42.4 04/12/2019 0818   PLT 357 04/12/2019 0818   MCV 84.0 04/12/2019 0818   MCH 27.5 04/12/2019 0818   MCHC 32.8 04/12/2019 0818   RDW 13.2 04/12/2019 0818   LYMPHSABS 2,967 04/12/2019 0818   EOSABS 95 04/12/2019 0818   BASOSABS 34 04/12/2019 0818    Hgb A1C Lab Results  Component Value Date   HGBA1C 7.5 (H) 08/27/2020           Assessment & Plan:   Preventative Health Maintenance:  She declines flu shot today Tetanus UTD Pneumovax UTD, will call pharmacy to get a copy of this Prevnar UTD Shingrix UTD, will call pharmacy to get a copy of second dose Encouraged her to get her covid booster Pap smear UTD Mammogram ordered, she will call to schedule Bone density UTD Colon screening UTD Encouraged her to consume a balanced diet and exercise regimen Advised her to see an eye doctor and dentist annually Will check CBC, CMET, Lipid, A1C and urine microalbumin today  RTC in 6 months, follow up chronic conditions  Webb Silversmith, NP This visit occurred during the SARS-CoV-2 public health emergency.  Safety protocols were in place, including screening questions prior to the visit, additional usage of staff PPE, and extensive cleaning of exam room while observing appropriate contact time as indicated for disinfecting solutions.

## 2020-12-18 LAB — COMPLETE METABOLIC PANEL WITH GFR
AG Ratio: 1.8 (calc) (ref 1.0–2.5)
ALT: 18 U/L (ref 6–29)
AST: 17 U/L (ref 10–35)
Albumin: 4.5 g/dL (ref 3.6–5.1)
Alkaline phosphatase (APISO): 102 U/L (ref 37–153)
BUN: 14 mg/dL (ref 7–25)
CO2: 25 mmol/L (ref 20–32)
Calcium: 9.2 mg/dL (ref 8.6–10.4)
Chloride: 106 mmol/L (ref 98–110)
Creat: 0.78 mg/dL (ref 0.50–1.05)
Globulin: 2.5 g/dL (calc) (ref 1.9–3.7)
Glucose, Bld: 111 mg/dL (ref 65–139)
Potassium: 4.8 mmol/L (ref 3.5–5.3)
Sodium: 140 mmol/L (ref 135–146)
Total Bilirubin: 0.4 mg/dL (ref 0.2–1.2)
Total Protein: 7 g/dL (ref 6.1–8.1)
eGFR: 83 mL/min/{1.73_m2} (ref >=60)

## 2020-12-18 LAB — CBC
HCT: 42.8 % (ref 35.0–45.0)
Hemoglobin: 13.3 g/dL (ref 11.7–15.5)
MCH: 26.9 pg — ABNORMAL LOW (ref 27.0–33.0)
MCHC: 31.1 g/dL — ABNORMAL LOW (ref 32.0–36.0)
MCV: 86.6 fL (ref 80.0–100.0)
MPV: 9.5 fL (ref 7.5–12.5)
Platelets: 329 10*3/uL (ref 140–400)
RBC: 4.94 10*6/uL (ref 3.80–5.10)
RDW: 13.4 % (ref 11.0–15.0)
WBC: 7.4 10*3/uL (ref 3.8–10.8)

## 2020-12-18 LAB — HEMOGLOBIN A1C
Hgb A1c MFr Bld: 7.5 % of total Hgb — ABNORMAL HIGH (ref <5.7)
Mean Plasma Glucose: 169 mg/dL
eAG (mmol/L): 9.3 mmol/L

## 2020-12-18 LAB — MICROALBUMIN / CREATININE URINE RATIO
Creatinine, Urine: 51 mg/dL (ref 20–275)
Microalb, Ur: <0.2 mg/dL

## 2020-12-18 LAB — LIPID PANEL
Cholesterol: 122 mg/dL (ref <200)
HDL: 54 mg/dL (ref >=50)
LDL Cholesterol (Calc): 49 mg/dL (calc)
Non-HDL Cholesterol (Calc): 68 mg/dL (calc) (ref <130)
Total CHOL/HDL Ratio: 2.3 (calc) (ref <5.0)
Triglycerides: 100 mg/dL (ref <150)

## 2020-12-24 ENCOUNTER — Ambulatory Visit (INDEPENDENT_AMBULATORY_CARE_PROVIDER_SITE_OTHER): Payer: PPO | Admitting: Pharmacist

## 2020-12-24 DIAGNOSIS — E782 Mixed hyperlipidemia: Secondary | ICD-10-CM

## 2020-12-24 DIAGNOSIS — E1165 Type 2 diabetes mellitus with hyperglycemia: Secondary | ICD-10-CM

## 2020-12-24 NOTE — Patient Instructions (Signed)
Visit Information  Our goal A1c is less than 7%. This corresponds with fasting sugars less than 130 and 2 hour after meal sugars less than 180. Please check your blood sugars and keep log of results  Our goal bad cholesterol, or LDL, is less than 70 . This is why it is important to continue taking your atorvastatin  Please check your home blood pressure, keep a log of the results and bring this with you to your medical appointments.  Feel free to call me with any questions or concerns. I look forward to our next call!   Wallace Cullens, PharmD, [place] [phone]  The patient verbalized understanding of instructions, educational materials, and care plan provided today and declined offer to receive copy of patient instructions, educational materials, and care plan.   Telephone follow up appointment with care management team member scheduled for: 12/21 at 8:30 am

## 2020-12-24 NOTE — Chronic Care Management (AMB) (Signed)
Chronic Care Management Pharmacy Note  12/24/2020 Name:  Summer Young MRN:  [id number] DOB:  1951/02/06  Subjective: Summer Young is an 70 y.o. year old female who is a primary patient of [name], NP.  The CCM team was consulted for assistance with disease management and care coordination needs.    Engaged with patient by telephone for follow up visit in response to provider referral for pharmacy case management and/or care coordination services.   Consent to Services:  The patient was given information about Chronic Care Management services, agreed to services, and gave verbal consent prior to initiation of services.  Please see initial visit note for detailed documentation.   Patient Care Team: [name], NP as PCP - General (Internal Medicine) Curley Spice Virl Diamond, [place] as Pharmacist [name], RN as Case Manager (General Practice) Rebekah Chesterfield, LCSW as Social Worker (Licensed Clinical Social Worker)  Recent office visits: Office Visit with PCP on 10/31 for annual exam  Hospital visits: None in previous 6 months  Objective:  Lab Results  Component Value Date   CREATININE 0.78 12/17/2020   CREATININE 0.68 08/27/2020   CREATININE 0.82 04/12/2019    Lab Results  Component Value Date   HGBA1C 7.5 (H) 12/17/2020   Last diabetic Eye exam:  Lab Results  Component Value Date/Time   HMDIABEYEEXA No Retinopathy 12/27/2018 12:00 AM    Last diabetic Foot exam: No results found for: HMDIABFOOTEX      Component Value Date/Time   CHOL 122 12/17/2020 0907   TRIG 100 12/17/2020 0907   HDL 54 12/17/2020 0907   CHOLHDL 2.3 12/17/2020 0907   LDLCALC 49 12/17/2020 0907    Hepatic Function Latest Ref Rng & Units 12/17/2020 08/27/2020 04/12/2019  Total Protein 6.1 - 8.1 g/dL 7.0 6.9 7.2  AST 10 - 35 U/L $Remo'17 19 18  'LKPOV$ ALT 6 - 29 U/L $Remo'18 18 17  'lubbK$ Total Bilirubin 0.2 - 1.2 mg/dL 0.4 0.3 0.5    Social History   Tobacco Use  Smoking Status Never  Smokeless  Tobacco Never   BP Readings from Last 3 Encounters:  12/17/20 130/86  08/27/20 140/77  07/09/20 122/70   Pulse Readings from Last 3 Encounters:  12/17/20 76  08/27/20 85  05/16/20 93   Wt Readings from Last 3 Encounters:  12/17/20 161 lb 12.8 oz (73.4 kg)  08/27/20 162 lb 6.4 oz (73.7 kg)  07/09/20 161 lb 12.8 oz (73.4 kg)    Assessment: Review of patient past medical history, allergies, medications, health status, including review of consultants reports, laboratory and other test data, was performed as part of comprehensive evaluation and provision of chronic care management services.   SDOH:  (Social Determinants of Health) assessments and interventions performed:    CCM Care Plan  Allergies  Allergen Reactions   Aspirin Hives    Other reaction(s): Other (See Comments) Other Reaction: Not Assessed    Insulin Detemir Hives and Rash    Errythema, edema, heat at site of injection did not improve after 2 weeks of use.     Shellfish Allergy Anaphylaxis   Dapagliflozin Other (See Comments)    Yeast infections Yeast infection Farxiga   Influenza Vaccines Other (See Comments)    Forxiga   Other     Other reaction(s): Other (See Comments) Uncoded Allergy. Allergen: Shellfish, Other Reaction: Not Assessed    Flu vaccine given 1998 caused anaphylaxis    Medications Reviewed Today     Reviewed by  [name], RN (Case Manager) on 12/17/20 at [place] List Status: <None>   Medication Order Taking? Sig Documenting Provider Last Dose Status Informant  acetaminophen (TYLENOL) 650 MG CR tablet [id number] No Take 650 mg by mouth daily as needed for pain. [provider] Taking Active   alendronate (FOSAMAX) 70 MG tablet [id number] No Take 1 tablet (70 mg total) by mouth once a week. Take with a full glass of water on an empty stomach. Olin Hauser, DO Taking Active   Ascorbic Acid (VITAMIN C) 1000 MG tablet [id number] No Take 1,000 mg by mouth daily as  needed.  [provider] Taking Active            Med Note Winfield Cunas, [facility]   Mon Sep 20, 2018  4:05 PM) As needed to prevent illness  atorvastatin (LIPITOR) 40 MG tablet [id number] No Take 1 tablet by mouth once daily [name], NP Taking Active   Biotin 1000 MCG tablet [id number] No Take 4,000 mcg by mouth every other day.  [provider] Taking Active Self           Med Note Broadus John, Trude Mcburney   Tue Jan 19, 2018 11:31 AM) [name] every other day  blood glucose meter kit and supplies [id number] No Dispense based on patient and insurance preference. Use up to four times daily as directed. (FOR ICD-10 E10.9, E11.9). [name], FNP Taking Active   Cholecalciferol (VITAMIN D3) 50 MCG (2000 UT) capsule [id number] No Take 1,000 Units by mouth daily.  [provider] Taking Active   insulin degludec (TRESIBA FLEXTOUCH) 100 UNIT/ML SOPN FlexTouch Pen [id number] No Inject 0.26 mLs (26 Units total) into the skin daily.  Patient taking differently: Inject 20 Units into the skin daily.   [name], NP Taking Active   metFORMIN (GLUCOPHAGE-XR) 500 MG 24 hr tablet [id number] No TAKE 2 TABLETS BY MOUTH ONCE DAILY WITH BREAKFAST Karamalegos, Devonne Doughty, DO Taking Active   [facility] ULTRA test strip [id number] No Check blood sugar 1 x daily as directed Olin Hauser, DO Taking Active   polyethylene glycol (MIRALAX / GLYCOLAX) 17 g packet [id number] No Take 17 g by mouth daily as needed. [provider] Taking Active   Semaglutide, 1 MG/DOSE, (OZEMPIC, 1 MG/DOSE,) 2 MG/1.5ML SOPN [id number] No INJECT 1 MG SUBCUTANEOUSLY ONCE A WEEK Karamalegos, Devonne Doughty, DO Taking Active   spironolactone (ALDACTONE) 25 MG tablet [id number] No Take 1 tablet by mouth once daily Olin Hauser, DO Taking Active   vitamin B-12 (CYANOCOBALAMIN) 1000 MCG tablet [id number] No Take 1,000 mcg by mouth daily.  [provider] Taking Active              Patient Active Problem List   Diagnosis Date Noted   Mixed incontinence 12/17/2020   Overweight with body mass index (BMI) of 29 to 29.9 in adult 08/28/2020   Osteoporosis 08/16/2019   Chronic bilateral low back pain without sciatica 11/22/2014   Chronic pain of both knees 11/22/2014   Essential hypertension 11/22/2014   Dyslipidemia associated with type 2 diabetes mellitus ([place]) 11/22/2014   Type 2 diabetes mellitus with hyperglycemia ([place]) 01/19/2013    Immunization History  Administered Date(s) Administered   PFIZER(Purple Top)SARS-COV-2 Vaccination 04/21/2019, 05/12/2019, 12/17/2019   Pneumococcal Conjugate-13 11/18/2017   Pneumococcal Polysaccharide-23 09/20/2008, 10/17/2020   Tdap 04/23/2007, 07/16/2017   Zoster Recombinat (Shingrix) 10/17/2020   Zoster, Live 11/12/2015    Conditions to be addressed/monitored: HTN,  HLD, and DMII  Care Plan : General Pharmacy (Adult)  Updates made by Rennis Petty, RPH-CPP since 12/24/2020 12:00 AM     Problem: Disease Progression      Long-Range Goal: Disease Progression Prevented or Minimized   Start Date: 01/23/2020  Expected End Date: 04/22/2020  This Visit's Progress: On track  Recent Progress: On track  Priority: High  Note:   Current Barriers:  Unable to independently afford treatment regimen Patient APPROVED for [name] and Ozempic medication assistance from [employer] for 2022 calendar year  Pharmacist Clinical Goal(s):  Over the next 90 days, patient will achieve control of T2DM as evidenced by A1C <7.0%. through collaboration with PharmD and provider.  Over the next 90 days, patient will maintain control of LDL as evidenced by LDL <70 mg/dL through collaboration with PharmD and provider.  Over the next 90 days, patient will verbalize ability to afford treatment regimen. through collaboration with PharmD and provider.  Interventions: Inter-disciplinary care team collaboration (see longitudinal plan of  care) 1:1 collaboration with [name], NP regarding development and update of comprehensive plan of care as evidenced by provider attestation and co-signature Perform chart review. Patient seen for Office Visit with PCP on 10/31 for annual exam  Medication Assistance Collaborating with [place] for aid to patient with re-applying Tresiba and Ozempic medication assistance from [employer] for 2023 calendar year Patient confirms received application,completed and mailed back to [facility] last week  Diabetes: Current treatment: Tresiba 20 units once daily Metformin ER 500 mg - 2 tablets daily with breakfast Ozempic 1 mg once weekly Reports fasting blood sugars from past week ranging 95-104 Denies hypoglycemia Note patient carries glucose tablets with her Reports doing well with consistently having meals spread throughout the day. Discuss importance of limiting carbohydrate portion sizes Exercise: Recently limited by lower back pain, but reports this pain has improved since taking acetaminophen 650 mg tablet twice daily on a scheduled basis and resting over the weekend Denies having gone to gym recently, but plans to go back this afternoon  Hyperlipidemia: Controlled; current treatment: atorvastatin 40 mg daily  Have counseled patient on importance of medication adherence Have discussed dietary management   Patient Goals/Self-Care Activities Over the next 90 days, patient will:  Take medications as prescribed Check blood glucose daily, document, and provide at future appointments Keeps log in App on phone Collaborate with provider on medication access solutions Attends all scheduled provider appointments Calls pharmacy for medication refills Calls provider office for new concerns or questions  Follow Up Plan: Telephone follow up appointment with care management team member scheduled for: 12/21 at 8:30 am     Patient's preferred pharmacy is:  [facility] [place] [facility] [address] [place] [address] Phone: [phone] Fax: [phone]   Follow Up:  Patient agrees to Care Plan and Follow-up.  Wallace Cullens, PharmD, [place], CPP Clinical Pharmacist [facility] [phone]

## 2021-01-08 ENCOUNTER — Telehealth: Payer: Self-pay | Admitting: Pharmacy Technician

## 2021-01-08 DIAGNOSIS — Z596 Low income: Secondary | ICD-10-CM

## 2021-01-08 NOTE — Progress Notes (Signed)
[place])                                            [place] Team    01/08/2021  Summer Young 09-13-1950 [id number]  Received both patient and provider portion(s) of patient assistance application(s) for [place] and Ozempic. Faxed completed application and required documents into [employer].    Summer Young P. Mariaeduarda Defranco, [place]  [phone]

## 2021-01-16 DIAGNOSIS — E782 Mixed hyperlipidemia: Secondary | ICD-10-CM

## 2021-01-16 DIAGNOSIS — Z794 Long term (current) use of insulin: Secondary | ICD-10-CM

## 2021-01-16 DIAGNOSIS — E1165 Type 2 diabetes mellitus with hyperglycemia: Secondary | ICD-10-CM | POA: Diagnosis not present

## 2021-01-24 ENCOUNTER — Telehealth: Payer: Self-pay

## 2021-01-24 ENCOUNTER — Telehealth: Payer: PPO

## 2021-01-24 NOTE — Telephone Encounter (Signed)
  Care Management   Follow Up Note   01/24/2021 Name: Shanika Levings MRN: [id number] DOB: 11/15/1950   Referred by: [name], NP Reason for referral : Chronic Care Management (RNCM: Follow up for Chronic Disease Management and Care Coordination Needs )   An unsuccessful telephone outreach was attempted today. The patient was referred to the case management team for assistance with care management and care coordination.   Follow Up Plan: The care management team will reach out to the patient again over the next 30 days.   Noreene Larsson RN, MSN, [place] Mobile: [phone]

## 2021-01-29 ENCOUNTER — Telehealth: Payer: PPO

## 2021-01-30 ENCOUNTER — Telehealth: Payer: Self-pay | Admitting: Licensed Clinical Social Worker

## 2021-01-30 NOTE — Telephone Encounter (Signed)
° °   Clinical Social Work  Care Management   Phone Outreach    01/30/2021 Name: Summer Young MRN: [id number] DOB: 04-09-1950  Summer Young is a 70 y.o. year old female who is a primary care patient of [name], NP .   Reason for referral: Mental Health Counseling and Resources.    F/U phone call today to assess needs, progress and barriers with care plan goals.   Telephone outreach was unsuccessful. A HIPPA compliant phone message was left for the patient providing contact information and requesting a return call.   Plan:CCM LCSW will wait for return call. If no return call is received, CCM LCSW will make another attempt within 30 days.  Review of patient status, including review of consultants reports, relevant laboratory and other test results, and collaboration with appropriate care team members and the patient's provider was performed as part of comprehensive patient evaluation and provision of care management services.     Christa See, MSW, [place] [facility] Care Management [email or web address] Phone [phone] 10:38 AM

## 2021-02-03 ENCOUNTER — Other Ambulatory Visit: Payer: Self-pay | Admitting: Internal Medicine

## 2021-02-03 ENCOUNTER — Other Ambulatory Visit: Payer: Self-pay | Admitting: Family Medicine

## 2021-02-03 DIAGNOSIS — E1165 Type 2 diabetes mellitus with hyperglycemia: Secondary | ICD-10-CM

## 2021-02-03 DIAGNOSIS — I10 Essential (primary) hypertension: Secondary | ICD-10-CM

## 2021-02-03 DIAGNOSIS — E1169 Type 2 diabetes mellitus with other specified complication: Secondary | ICD-10-CM

## 2021-02-03 DIAGNOSIS — Z794 Long term (current) use of insulin: Secondary | ICD-10-CM

## 2021-02-04 NOTE — Telephone Encounter (Signed)
Requested Prescriptions  Pending Prescriptions Disp Refills   metFORMIN (GLUCOPHAGE-XR) 500 MG 24 hr tablet [Pharmacy Med Name: metFORMIN HCl ER 500 MG Oral Tablet Extended Release 24 Hour] 270 tablet 0    Sig: TAKE 3 TABLETS BY MOUTH ONCE DAILY WITH BREAKFAST     Endocrinology:  Diabetes - Biguanides Passed - 02/03/2021  6:39 PM      Passed - Cr in normal range and within 360 days    Creat  Date Value Ref Range Status  12/17/2020 0.78 0.50 - 1.05 mg/dL Final   Creatinine, Urine  Date Value Ref Range Status  12/17/2020 51 20 - 275 mg/dL Final         Passed - HBA1C is between 0 and 7.9 and within 180 days    Hgb A1c MFr Bld  Date Value Ref Range Status  12/17/2020 7.5 (H) <5.7 % of total Hgb Final    Comment:    For someone without known diabetes, a hemoglobin A1c value of 6.5% or greater indicates that they may have  diabetes and this should be confirmed with a follow-up  test. . For someone with known diabetes, a value <7% indicates  that their diabetes is well controlled and a value  greater than or equal to 7% indicates suboptimal  control. A1c targets should be individualized based on  duration of diabetes, age, comorbid conditions, and  other considerations. . Currently, no consensus exists regarding use of hemoglobin A1c for diagnosis of diabetes for children. .          Passed - eGFR in normal range and within 360 days    GFR, Est African American  Date Value Ref Range Status  06/10/2018 91 > OR = 60 mL/min/1.82m2 Final   GFR, Est Non African American  Date Value Ref Range Status  06/10/2018 79 > OR = 60 mL/min/1.37m2 Final   eGFR  Date Value Ref Range Status  12/17/2020 83 > OR = 60 mL/min/1.36m2 Final    Comment:    The eGFR is based on the CKD-EPI 2021 equation. To calculate  the new eGFR from a previous Creatinine or Cystatin C result, go to https://www.kidney.org/professionals/ kdoqi/gfr%5Fcalculator          Passed - Valid encounter within  last 6 months    Recent Outpatient Visits          1 month ago Encounter for general adult medical examination with abnormal findings   [facility] [place], Salvadore [place], NP   5 months ago Bilateral hip pain   [facility] [place], [place], NP   8 months ago Postmenopausal bleeding   [facility] [place], Netta Neat, DO   9 months ago Type 2 diabetes mellitus with hyperglycemia, without long-term current use of insulin [facility])   [facility], Netta Neat, DO   1 year ago Type 2 diabetes mellitus with hyperglycemia, without long-term current use of insulin [facility])   [facility], Jodelle Gross, FNP      Future Appointments            In 3 months [place], [facility]             spironolactone (ALDACTONE) 25 MG tablet [Pharmacy Med Name: Spironolactone 25 MG Oral Tablet] 90 tablet 0    Sig: Take 1 tablet by mouth once daily     Cardiovascular: Diuretics - Aldosterone Antagonist Passed - 02/03/2021  6:39 PM  Passed - Cr in normal range and within 360 days    Creat  Date Value Ref Range Status  12/17/2020 0.78 0.50 - 1.05 mg/dL Final   Creatinine, Urine  Date Value Ref Range Status  12/17/2020 51 20 - 275 mg/dL Final         Passed - K in normal range and within 360 days    Potassium  Date Value Ref Range Status  12/17/2020 4.8 3.5 - 5.3 mmol/L Final         Passed - Na in normal range and within 360 days    Sodium  Date Value Ref Range Status  12/17/2020 140 135 - 146 mmol/L Final         Passed - Last BP in normal range    BP Readings from Last 1 Encounters:  12/17/20 130/86         Passed - Valid encounter within last 6 months    Recent Outpatient Visits          1 month ago Encounter for general adult medical examination with abnormal findings   [facility] [place], Coralie Keens, NP   5 months ago Bilateral hip pain   [facility]  [place], [place], NP   8 months ago Postmenopausal bleeding   [facility] [place], Devonne Doughty, DO   9 months ago Type 2 diabetes mellitus with hyperglycemia, without long-term current use of insulin [facility])   [place], DO   1 year ago Type 2 diabetes mellitus with hyperglycemia, without long-term current use of insulin ([place])   [facility], Lupita Raider, FNP      Future Appointments            In 3 months [facility], [facility]

## 2021-02-04 NOTE — Telephone Encounter (Signed)
Requested Prescriptions  Pending Prescriptions Disp Refills   atorvastatin (LIPITOR) 40 MG tablet [Pharmacy Med Name: Atorvastatin Calcium 40 MG Oral Tablet] 90 tablet 0    Sig: Take 1 tablet by mouth once daily     Cardiovascular:  Antilipid - Statins Passed - 02/03/2021  6:39 PM      Passed - Total Cholesterol in normal range and within 360 days    Cholesterol  Date Value Ref Range Status  12/17/2020 122 <200 mg/dL Final         Passed - LDL in normal range and within 360 days    LDL Cholesterol (Calc)  Date Value Ref Range Status  12/17/2020 49 mg/dL (calc) Final    Comment:    Reference range: <100 . Desirable range <100 mg/dL for primary prevention;   <70 mg/dL for patients with CHD or diabetic patients  with > or = 2 CHD risk factors. Marland Kitchen LDL-C is now calculated using the Martin-Hopkins  calculation, which is a validated novel method providing  better accuracy than the Friedewald equation in the  estimation of LDL-C.  Cresenciano Genre et al. Annamaria Helling. 1749;449(67): 2061-2068  (http://education.Quest[employer].com/faq/FAQ164)          Passed - HDL in normal range and within 360 days    HDL  Date Value Ref Range Status  12/17/2020 54 > OR = 50 mg/dL Final         Passed - Triglycerides in normal range and within 360 days    Triglycerides  Date Value Ref Range Status  12/17/2020 100 <150 mg/dL Final         Passed - Patient is not pregnant      Passed - Valid encounter within last 12 months    Recent Outpatient Visits          1 month ago Encounter for general adult medical examination with abnormal findings   [facility] [place], Coralie Keens, NP   5 months ago Bilateral hip pain   [facility] [place], Coralie Keens, NP   8 months ago Postmenopausal bleeding   [facility] [place], Devonne Doughty, DO   9 months ago Type 2 diabetes mellitus with hyperglycemia, without long-term current use of insulin [facility])   [place], DO   1 year ago Type 2 diabetes mellitus with hyperglycemia, without long-term current use of insulin ([place])   [facility], Lupita Raider, FNP      Future Appointments            In 3 months [facility], [facility]

## 2021-02-06 ENCOUNTER — Ambulatory Visit (INDEPENDENT_AMBULATORY_CARE_PROVIDER_SITE_OTHER): Payer: PPO | Admitting: Pharmacist

## 2021-02-06 DIAGNOSIS — Z794 Long term (current) use of insulin: Secondary | ICD-10-CM

## 2021-02-06 NOTE — Patient Instructions (Signed)
Visit Information  Thank you for taking time to visit with me today. Please don't hesitate to contact me if I can be of assistance to you before our next scheduled telephone appointment.  Following are the goals we discussed today:   Goals Addressed             This Visit's Progress    Pharmacy Goals       Our goal A1c is less than 7%. This corresponds with fasting sugars less than 130 and 2 hour after meal sugars less than 180. Please check your blood sugars and keep log of results  Our goal bad cholesterol, or LDL, is less than 70 . This is why it is important to continue taking your atorvastatin  Please check your home blood pressure, keep a log of the results and bring this with you to your medical appointments.  Feel free to call me with any questions or concerns. I look forward to our next call!  Wallace Cullens, PharmD, [place] [phone]          Our next appointment is by telephone on 03/18/2021 at 9:15 am  Please call the care guide team at [phone] if you need to cancel or reschedule your appointment.    The patient verbalized understanding of instructions, educational materials, and care plan provided today and declined offer to receive copy of patient instructions, educational materials, and care plan.

## 2021-02-06 NOTE — Chronic Care Management (AMB) (Signed)
Chronic Care Management CCM Pharmacy Note  02/06/2021 Name:  Meryn Sarracino MRN:  [id number] DOB:  1950-06-05   Subjective: Sharone Picchi is an 70 y.o. year old female who is a primary patient of Lorre Munroe, NP.  The CCM team was consulted for assistance with disease management and care coordination needs.    Engaged with patient by telephone for follow up visit for pharmacy case management and/or care coordination services.   Objective:  Medications Reviewed Today     Reviewed by Manuela Neptune, RPH-CPP (Pharmacist) on 02/06/21 at 1102  Med List Status: <None>   Medication Order Taking? Sig Documenting Provider Last Dose Status Informant  acetaminophen (TYLENOL) 650 MG CR tablet [id number] Yes Take 650 mg by mouth 2 (two) times daily as needed for pain. [provider] Taking Active   alendronate (FOSAMAX) 70 MG tablet [id number]  Take 1 tablet (70 mg total) by mouth once a week. Take with a full glass of water on an empty stomach. Smitty Cords, DO  Active   Ascorbic Acid (VITAMIN C) 1000 MG tablet [id number]  Take 1,000 mg by mouth daily as needed.  [provider]  Active            Med Note De Nurse, [facility]   Mon Sep 20, 2018  4:05 PM) As needed to prevent illness  atorvastatin (LIPITOR) 40 MG tablet [id number]  Take 1 tablet by mouth once daily Lorre Munroe, NP  Active   Biotin 1000 MCG tablet [id number]  Take 4,000 mcg by mouth every other day.  [provider]  Active Self           Med Note Bary Richard   Tue Jan 19, 2018 11:31 AM) Leanora Ivanoff every other day  blood glucose meter kit and supplies [id number]  Dispense based on patient and insurance preference. Use up to four times daily as directed. (FOR ICD-10 E10.9, E11.9). Tarri Fuller, FNP  Active   Cholecalciferol (VITAMIN D3) 50 MCG (2000 UT) capsule [id number]  Take 1,000 Units by mouth daily.  [provider]  Active   insulin degludec (TRESIBA FLEXTOUCH) 100 UNIT/ML  SOPN FlexTouch Pen [id number] Yes Inject 0.26 mLs (26 Units total) into the skin daily.  Patient taking differently: Inject 20 Units into the skin daily.   Galen Manila, NP Taking Active   metFORMIN (GLUCOPHAGE-XR) 500 MG 24 hr tablet [id number] Yes TAKE 3 TABLETS BY MOUTH ONCE DAILY WITH BREAKFAST Lorre Munroe, NP Taking Active   [facility] ULTRA test strip [id number]  Check blood sugar 1 x daily as directed Smitty Cords, DO  Active   polyethylene glycol (MIRALAX / GLYCOLAX) 17 g packet [id number]  Take 17 g by mouth daily as needed. [provider]  Active   Semaglutide, 1 MG/DOSE, (OZEMPIC, 1 MG/DOSE,) 2 MG/1.5ML SOPN [id number] Yes INJECT 1 MG SUBCUTANEOUSLY ONCE A WEEK [place], Netta Neat, DO Taking Active   spironolactone (ALDACTONE) 25 MG tablet [id number]  Take 1 tablet by mouth once daily Lorre Munroe, NP  Active   vitamin B-12 (CYANOCOBALAMIN) 1000 MCG tablet [id number]  Take 1,000 mcg by mouth daily.  [provider]  Active             Pertinent Labs:  Lab Results  Component Value Date   HGBA1C 7.5 (H) 12/17/2020   Lab Results  Component Value Date   CHOL 122 12/17/2020   HDL 54 12/17/2020   LDLCALC 49 12/17/2020  TRIG 100 12/17/2020   CHOLHDL 2.3 12/17/2020   Lab Results  Component Value Date   CREATININE 0.78 12/17/2020   BUN 14 12/17/2020   NA 140 12/17/2020   K 4.8 12/17/2020   CL 106 12/17/2020   CO2 25 12/17/2020    SDOH:  (Social Determinants of Health) assessments and interventions performed:    [place]  Review of patient past medical history, allergies, medications, health status, including review of consultants reports, laboratory and other test data, was performed as part of comprehensive evaluation and provision of chronic care management services.   Care Plan : General Pharmacy (Adult)  Updates made by Rennis Petty, RPH-CPP since 02/06/2021 12:00 AM     Problem: Disease Progression       Long-Range Goal: Disease Progression Prevented or Minimized   Start Date: 01/23/2020  Expected End Date: 04/22/2020  This Visit's Progress: On track  Recent Progress: On track  Priority: High  Note:   Current Barriers:  Unable to independently afford treatment regimen Patient APPROVED for [name] and Ozempic medication assistance from [employer] for 2022 calendar year  Pharmacist Clinical Goal(s):  Over the next 90 days, patient will achieve control of T2DM as evidenced by A1C <7.0%. through collaboration with PharmD and provider.  Over the next 90 days, patient will maintain control of LDL as evidenced by LDL <70 mg/dL through collaboration with PharmD and provider.  Over the next 90 days, patient will verbalize ability to afford treatment regimen. through collaboration with PharmD and provider.  Interventions: Inter-disciplinary care team collaboration (see longitudinal plan of care) 1:1 collaboration with [name], NP regarding development and update of comprehensive plan of care as evidenced by provider attestation and co-signature Reports back pain has been worse recently, particularly since weather colder Reports using acetaminophen 650 mg twice daily and warm showers to help manage pain. Notes massage chair also helps Note patient works cleaning houses. Encourage to try to avoid activities that limit back pain Confirms plans to call office to schedule follow up visit with PCP regarding back pain  Medication Assistance Collaborating with [place] for aid to patient with re-applying Tresiba and Ozempic medication assistance from [employer] for 2023 calendar year From review of chart, not [facility] CPhT faxed both patient and provider portions of application to [employer] on 11/22  Diabetes: Current treatment: Tresiba 20 units once daily Metformin ER 500 mg - 2 tablets daily with breakfast Ozempic 1 mg once weekly Reports fasting blood sugars from past week  ranging: 87-105 Discuss importance of monitoring for s/s of hypoglycemia Note patient carries glucose tablets with her Discuss importance of having well-balanced meals spread throughout the day, while limiting carbohydrate portion sizes Exercise: Recently limited by lower back pain  Hyperlipidemia: Controlled; current treatment: atorvastatin 40 mg daily  Have counseled patient on importance of medication adherence Have discussed dietary management   Patient Goals/Self-Care Activities Over the next 90 days, patient will:  Take medications as prescribed Check blood glucose daily, document, and provide at future appointments Keeps log in App on phone Collaborate with provider on medication access solutions Attends all scheduled provider appointments Calls pharmacy for medication refills Calls provider office for new concerns or questions  Follow Up Plan: Telephone follow up appointment with care management team member scheduled for: 03/18/2021 at 9:15 am      Wallace Cullens, PharmD, [place], [place] [phone]

## 2021-02-13 ENCOUNTER — Telehealth: Payer: Self-pay | Admitting: Pharmacy Technician

## 2021-02-13 DIAGNOSIS — Z596 Low income: Secondary | ICD-10-CM

## 2021-02-13 NOTE — Progress Notes (Signed)
[facility]                                            [place] Team    02/13/2021  Moranda Billiot Feb 13, 1951 [id number]  Care coordination call placed to [place] in regard to [place] application.  Spoke to  [place] who informed patient is APPROVED 02/17/21-02/16/22. Medications will begin to ship the first business day in 2023 based on last fill date in 2022.  Leisel Pinette P. Maki Hege, [place]  [phone]

## 2021-02-19 ENCOUNTER — Ambulatory Visit (INDEPENDENT_AMBULATORY_CARE_PROVIDER_SITE_OTHER): Payer: PPO | Admitting: Licensed Clinical Social Worker

## 2021-02-19 DIAGNOSIS — I10 Essential (primary) hypertension: Secondary | ICD-10-CM

## 2021-02-19 DIAGNOSIS — M545 Low back pain, unspecified: Secondary | ICD-10-CM

## 2021-02-19 DIAGNOSIS — G8929 Other chronic pain: Secondary | ICD-10-CM

## 2021-02-19 DIAGNOSIS — E1165 Type 2 diabetes mellitus with hyperglycemia: Secondary | ICD-10-CM

## 2021-02-19 DIAGNOSIS — M25562 Pain in left knee: Secondary | ICD-10-CM

## 2021-02-20 NOTE — Chronic Care Management (AMB) (Signed)
Chronic Care Management    Clinical Social Work Note  02/20/2021 Name: Summer Young MRN: [id number] DOB: [birth year]  Clorine Swing is a 71 y.o. year old female who is a primary care patient of [name], NP. The CCM team was consulted to assist the patient with chronic disease management and/or care coordination needs related to:  Stress .   Engaged with patient by telephone for follow up visit in response to provider referral for social work chronic care management and care coordination services.   Consent to Services:  The patient was given information about Chronic Care Management services, agreed to services, and gave verbal consent prior to initiation of services.  Please see initial visit note for detailed documentation.   Patient agreed to services and consent obtained.   Consent to Services:  The patient was given information about Care Management services, agreed to services, and gave verbal consent prior to initiation of services.  Please see initial visit note for detailed documentation.   Patient agreed to services today and consent obtained.  Engaged with patient by phone in response to provider referral for social work care coordination services:  Assessment/Interventions:  Patient continues to maintain positive progress with care plan goals. Patient identified ongoing stress triggered by burn-out and chronic pain. CCM LCSW and patient identified strategies to increase boundaries and self-care to promote health and well-being  See Care Plan below for interventions and patient self-care activities.  Recent life changes or stressors: Chronic pain and relationship stress  Recommendation: Patient may benefit from, and is in agreement work with LCSW to address care coordination needs and will continue to work with the clinical team to address health care and disease management related needs.   Follow up Plan: Patient would like continued follow-up from CCM LCSW.  per patient's  request will follow up in 05/21/21.  Will call office if needed prior to next encounter.    SDOH (Social Determinants of Health) assessments and interventions performed:    Advanced Directives Status: Not addressed in this encounter.  CCM Care Plan  Allergies  Allergen Reactions   Aspirin Hives    Other reaction(s): Other (See Comments) Other Reaction: Not Assessed    Insulin Detemir Hives and Rash    Errythema, edema, heat at site of injection did not improve after 2 weeks of use.     Shellfish Allergy Anaphylaxis   Dapagliflozin Other (See Comments)    Yeast infections Yeast infection Farxiga   Influenza Vaccines Other (See Comments)    Forxiga   Other     Other reaction(s): Other (See Comments) Uncoded Allergy. Allergen: Shellfish, Other Reaction: Not Assessed    Flu vaccine given 1998 caused anaphylaxis    Outpatient Encounter Medications as of 02/19/2021  Medication Sig Note   acetaminophen (TYLENOL) 650 MG CR tablet Take 650 mg by mouth 2 (two) times daily as needed for pain.    alendronate (FOSAMAX) 70 MG tablet Take 1 tablet (70 mg total) by mouth once a week. Take with a full glass of water on an empty stomach.    Ascorbic Acid (VITAMIN C) 1000 MG tablet Take 1,000 mg by mouth daily as needed.  09/20/2018: As needed to prevent illness   atorvastatin (LIPITOR) 40 MG tablet Take 1 tablet by mouth once daily    Biotin 1000 MCG tablet Take 4,000 mcg by mouth every other day.  01/19/2018: Takes every other day   blood glucose meter kit and supplies Dispense based on patient and insurance  preference. Use up to four times daily as directed. (FOR ICD-10 E10.9, E11.9).    Cholecalciferol (VITAMIN D3) 50 MCG (2000 UT) capsule Take 1,000 Units by mouth daily.     insulin degludec (TRESIBA FLEXTOUCH) 100 UNIT/ML SOPN FlexTouch Pen Inject 0.26 mLs (26 Units total) into the skin daily. (Patient taking differently: Inject 20 Units into the skin daily.)    metFORMIN (GLUCOPHAGE-XR) 500 MG  24 hr tablet TAKE 3 TABLETS BY MOUTH ONCE DAILY WITH BREAKFAST    ONETOUCH ULTRA test strip Check blood sugar 1 x daily as directed    polyethylene glycol (MIRALAX / GLYCOLAX) 17 g packet Take 17 g by mouth daily as needed.    Semaglutide, 1 MG/DOSE, (OZEMPIC, 1 MG/DOSE,) 2 MG/1.5ML SOPN INJECT 1 MG SUBCUTANEOUSLY ONCE A WEEK    spironolactone (ALDACTONE) 25 MG tablet Take 1 tablet by mouth once daily    vitamin B-12 (CYANOCOBALAMIN) 1000 MCG tablet Take 1,000 mcg by mouth daily.     No facility-administered encounter medications on file as of 02/19/2021.    Patient Active Problem List   Diagnosis Date Noted   Mixed incontinence 12/17/2020   Overweight with body mass index (BMI) of 29 to 29.9 in adult 08/28/2020   Osteoporosis 08/16/2019   Chronic bilateral low back pain without sciatica 11/22/2014   Chronic pain of both knees 11/22/2014   Essential hypertension 11/22/2014   Dyslipidemia associated with type 2 diabetes mellitus ([place]) 11/22/2014   Type 2 diabetes mellitus with hyperglycemia ([place]) 01/19/2013    Conditions to be addressed/monitored: HTN, DMII, and Osteoporosis; Family and relationship dysfunction  Care Plan : General Social Work (Adult)  Updates made by Christa See D, LCSW since 02/20/2021 12:00 AM     Problem: Depression Identification (Depression)      Long-Range Goal: Depressive Symptoms Identified   Start Date: 01/24/2020  This Visit's Progress: On track  Recent Progress: On track  Priority: Medium  Note:   Current Barriers:  Acute Mental Health needs related to anxiety/stress Mental Health Concerns  Suicidal Ideation/Homicidal Ideation: No Clinical Social Work Goal(s):  Over the next 120 days, patient will work with SW bi-monthly by telephone or in person to reduce or manage symptoms related to anxiety Over the next 120 days, patient will work with SW to address concerns related to gaining mental health support and develop better coping skills to manage  symptoms Interventions: Patient interviewed and appropriate assessments performed Pt reports that she is managing symptoms of depression and anxiety well. Insomnia symptoms have resolved 09/13: Patient reports a decrease in anxiety "everything is under control" 1/3:Patient identified ongoing stress triggered by burn-out and chronic pain. CCM LCSW and patient identified strategies to increase boundaries and self-care to promote health and well-being Patient reports strain in marriage and is interested in therapy (Bilingual counselor requested) Patient prefers in person sessions 08/16: CCM LCSW informed patient of update regarding referral. CCM LCSW is awaiting a response from multiple local agencies regarding their ability to accept patient insurance, in addition, to having bilingual therapist availability 09/13: Patient reports relationship with spouse has improved and they are working on strengthening communication with one another. Services are not needed at this time CCM LCSW provided support and encouragement. Strategies to assist with communication with spouse discussed. Patient provided verbal consent for CCM LCSW to complete referral to therapy 08/16: CCM LCSW discussed strategies to assist in management of stress Patient was successful in identifying healthy coping skills. She has returned to the gym and continues to  meet with friends 1/3: Patient shared that she continues to eat balanced meals and maintain active lifestyle which helped her manage health conditions and relieve stressors Patient was successful in reflection and identifying progress of health goals, including decreasing the amount of medications she was taking "I came a long way" 1/3: Patient only takes Tylenol when pain is severe. She utilizes other holistic methods to cope with pain, such as, rest and drinking tea CCM LCSW discussed plans with patient for ongoing care management follow up and provided patient with direct contact  information for care management team CCM LCSW advised patient to contact CCM program for any urgent case management needs Solution-Focused Strategies, Active listening / Reflection utilized , Emotional Supportive Provided, Participation in counseling encouraged , and Verbalization of feelings encouraged  1:1 collaboration with primary care provider regarding development and update of comprehensive plan of care as evidenced by provider attestation and co-signature Patient Self Care Activities:  Attend all scheduled provider appointments Continue utilizing healthy coping skills and strategies on communication Contact clinic with any questions or concerns       Christa See, MSW, [email or web address] Phone [phone] 9:54 AM

## 2021-02-20 NOTE — Patient Instructions (Signed)
Visit Information  Thank you for taking time to visit with me today. Please don't hesitate to contact me if I can be of assistance to you before our next scheduled telephone appointment.  Following are the goals we discussed today:  Patient Self Care Activities:  Attend all scheduled provider appointments Continue utilizing healthy coping skills and strategies on communication Contact clinic with any questions or concerns  Our next appointment is by telephone on 05/21/21 at 2:15 PM  Please call the care guide team at [phone] if you need to cancel or reschedule your appointment.   If you are experiencing a Mental Health or [place] or need someone to talk to, please call the Suicide and Crisis Lifeline: 988 call 911   Patient verbalizes understanding of instructions provided today and agrees to view in [place].   Christa See, MSW, [facility] Care Management [email or web address] Phone [phone] 9:57 AM

## 2021-03-07 ENCOUNTER — Ambulatory Visit: Payer: Self-pay

## 2021-03-07 ENCOUNTER — Telehealth: Payer: PPO

## 2021-03-07 DIAGNOSIS — G8929 Other chronic pain: Secondary | ICD-10-CM

## 2021-03-07 DIAGNOSIS — I10 Essential (primary) hypertension: Secondary | ICD-10-CM

## 2021-03-07 DIAGNOSIS — M545 Low back pain, unspecified: Secondary | ICD-10-CM

## 2021-03-07 DIAGNOSIS — M810 Age-related osteoporosis without current pathological fracture: Secondary | ICD-10-CM

## 2021-03-07 DIAGNOSIS — E782 Mixed hyperlipidemia: Secondary | ICD-10-CM

## 2021-03-07 DIAGNOSIS — E1165 Type 2 diabetes mellitus with hyperglycemia: Secondary | ICD-10-CM

## 2021-03-07 NOTE — Chronic Care Management (AMB) (Signed)
Chronic Care Management   CCM RN Visit Note  03/07/2021 Name: Summer Young MRN: [id number] DOB: September 26, 1950  Subjective: Summer Young is a 71 y.o. year old female who is a primary care patient of [name], NP. The care management team was consulted for assistance with disease management and care coordination needs.    Engaged with patient by telephone for follow up visit in response to provider referral for case management and/or care coordination services.   Consent to Services:  The patient was given information about Chronic Care Management services, agreed to services, and gave verbal consent prior to initiation of services.  Please see initial visit note for detailed documentation.   Patient agreed to services and verbal consent obtained.   Assessment: Review of patient past medical history, allergies, medications, health status, including review of consultants reports, laboratory and other test data, was performed as part of comprehensive evaluation and provision of chronic care management services.   SDOH (Social Determinants of Health) assessments and interventions performed:    CCM Care Plan  Allergies  Allergen Reactions   Aspirin Hives    Other reaction(s): Other (See Comments) Other Reaction: Not Assessed    Insulin Detemir Hives and Rash    Errythema, edema, heat at site of injection did not improve after 2 weeks of use.     Shellfish Allergy Anaphylaxis   Dapagliflozin Other (See Comments)    Yeast infections Yeast infection Farxiga   Influenza Vaccines Other (See Comments)    Forxiga   Other     Other reaction(s): Other (See Comments) Uncoded Allergy. Allergen: Shellfish, Other Reaction: Not Assessed    Flu vaccine given 1998 caused anaphylaxis    Outpatient Encounter Medications as of 03/07/2021  Medication Sig Note   acetaminophen (TYLENOL) 650 MG CR tablet Take 650 mg by mouth 2 (two) times daily as needed for pain.    alendronate (FOSAMAX) 70 MG  tablet Take 1 tablet (70 mg total) by mouth once a week. Take with a full glass of water on an empty stomach.    Ascorbic Acid (VITAMIN C) 1000 MG tablet Take 1,000 mg by mouth daily as needed.  09/20/2018: As needed to prevent illness   atorvastatin (LIPITOR) 40 MG tablet Take 1 tablet by mouth once daily    Biotin 1000 MCG tablet Take 4,000 mcg by mouth every other day.  01/19/2018: Takes every other day   blood glucose meter kit and supplies Dispense based on patient and insurance preference. Use up to four times daily as directed. (FOR ICD-10 E10.9, E11.9).    Cholecalciferol (VITAMIN D3) 50 MCG (2000 UT) capsule Take 1,000 Units by mouth daily.     insulin degludec (TRESIBA FLEXTOUCH) 100 UNIT/ML SOPN FlexTouch Pen Inject 0.26 mLs (26 Units total) into the skin daily. (Patient taking differently: Inject 20 Units into the skin daily.)    metFORMIN (GLUCOPHAGE-XR) 500 MG 24 hr tablet TAKE 3 TABLETS BY MOUTH ONCE DAILY WITH BREAKFAST    ONETOUCH ULTRA test strip Check blood sugar 1 x daily as directed    polyethylene glycol (MIRALAX / GLYCOLAX) 17 g packet Take 17 g by mouth daily as needed.    Semaglutide, 1 MG/DOSE, (OZEMPIC, 1 MG/DOSE,) 2 MG/1.5ML SOPN INJECT 1 MG SUBCUTANEOUSLY ONCE A WEEK    spironolactone (ALDACTONE) 25 MG tablet Take 1 tablet by mouth once daily    vitamin B-12 (CYANOCOBALAMIN) 1000 MCG tablet Take 1,000 mcg by mouth daily.     No facility-administered encounter medications  on file as of 03/07/2021.    Patient Active Problem List   Diagnosis Date Noted   Mixed incontinence 12/17/2020   Overweight with body mass index (BMI) of 29 to 29.9 in adult 08/28/2020   Osteoporosis 08/16/2019   Chronic bilateral low back pain without sciatica 11/22/2014   Chronic pain of both knees 11/22/2014   Essential hypertension 11/22/2014   Dyslipidemia associated with type 2 diabetes mellitus ([place]) 11/22/2014   Type 2 diabetes mellitus with hyperglycemia ([place]) 01/19/2013    Conditions  to be addressed/monitored:HTN, HLD, DMII, Osteoporosis, and chronic pain   Care Plan : RNCM: General Plan of Care (Adult) for Chronic Disease Management and Care Coordination Needs  Updates made by [name], RN since 03/07/2021 12:00 AM     Problem: RNCM: Development for plan of care for Chronic Disease Management (HTN, HLD, DM, Chronic pain, osteoporosis)   Priority: High     Long-Range Goal: RNCM: Development for plan of care for Chronic Disease Management (HTN, HLD, DM, Chronic pain, osteoporosis)   Start Date: 12/06/2020  Expected End Date: 12/06/2021  Priority: High  Note:   Current Barriers:  Knowledge Deficits related to plan of care for management of HTN, HLD, DMII, and Chronic pain and osteoporosis   Chronic Disease Management support and education needs related to HTN, HLD, DMII, and Chronic pain and osteoporosis   RNCM Clinical Goal(s):  Patient will verbalize understanding of plan for management of HTN, HLD, DMII, Osteoporosis, and chronic pain  verbalize basic understanding of HTN, HLD, DMII, Osteoporosis, and chronic pain disease process and self health management plan  take all medications exactly as prescribed and will call provider for medication related questions demonstrate understanding of rationale for each prescribed medication  attend all scheduled medical appointments: 12-17-2020 at 0820 am, saw pcp today. Awaiting results of lab work and then will be scheduled for a follow up. 03-07-2021: No upcoming appointments, knows to call for changes  demonstrate improved and ongoing adherence to prescribed treatment plan for HTN, HLD, DMII, Osteoporosis, and Chronic pain as evidenced by daily monitoring and recording of CBG  adherence to ADA/ carb modified diet adherence to prescribed medication regimen contacting provider for new or worsened symptoms or questions  demonstrate improved and ongoing health management independence  work with pharmacist to address DM  management and medications needs related to DMII demonstrate a decrease in HTN, HLD, DMII, Osteoporosis, and chronic pain exacerbations  demonstrate ongoing self health care management ability for effective management of chronic conditions  through collaboration with RN Care manager, provider, and care team.   Interventions: 1:1 collaboration with primary care provider regarding development and update of comprehensive plan of care as evidenced by provider attestation and co-signature Inter-disciplinary care team collaboration (see longitudinal plan of care) Evaluation of current treatment plan related to  self management and patient's adherence to plan as established by provider   SDOH Barriers (Status: Goal on track: YES.)  Patient interviewed and SDOH assessment performed        SDOH Interventions    Flowsheet Row Most Recent Value  SDOH Interventions   Food Insecurity Interventions Intervention Not Indicated  Financial Strain Interventions Other (Comment)  [patient assistance program for medications]  Housing Interventions Intervention Not Indicated  Intimate Partner Violence Interventions Intervention Not Indicated  Physical Activity Interventions Other (Comments)  [the patient is active and cleans houses, 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]  Stress Interventions Intervention Not Indicated  Social Connections Interventions Other (Comment)  [  good support system]  Transportation Interventions Intervention Not Indicated     Patient interviewed and appropriate assessments performed Provided patient with information about resources available for SDOH needs  Discussed plans with patient for ongoing care management follow up and provided patient with direct contact information for care management team Advised patient to call the office for changes in [place], questions or concerns    Diabetes:  (Status: Goal on track: YES.) Lab Results  Component Value Date   HGBA1C 7.5 (H)  12/17/2020  12-17-2020: Pending new results of new HGBA1C today in the office. 7.5% Assessed patient's understanding of A1c goal: <7% Provided education to patient about basic DM disease process; Reviewed medications with patient and discussed importance of medication adherence;        Reviewed prescribed diet with patient heart healthy/ADA. 12-17-2020: Supplied the patient with Glucerna samples to try to help with adding nutrients to her diet. 03-07-2021: The patient is eating well and denies any new issues with dietary restrictions ; Counseled on importance of regular laboratory monitoring as prescribed. 12-17-2020: The patient has pending lab work today. 03-07-2021: Has regular labwork. No acute findings;        Discussed plans with patient for ongoing care management follow up and provided patient with direct contact information for care management team;      Provided patient with written educational materials related to hypo and hyperglycemia and importance of correct treatment. 03-07-2021: Lowest blood sugar reading has been 87. Highest she has seen is 125       Reviewed scheduled/upcoming provider appointments including: 12-17-2020 at 0820 am, has completed appointment with pcp, knows to call for new concerns or needs, will follow up with the pcp in 6 months.     03-07-2021: No upcoming appointments with pcp, knows to call for changes     Advised patient, providing education and rationale, to check cbg as directed  and record. 03-07-2021:The patient is compliant with recommendations to check blood sugars. Gave range of blood sugars 87 to 125. Ask about the   freestyle [place]. Education given. The patient states she does not know that she needs this.     call provider for findings outside established parameters. 12-17-2020: States her blood sugars are doing well and states they are in the 100's. 03-07-2021: Knows to call for changes in condition and if her blood sugars are out of range      Referral made  to pharmacy team for assistance with medications assistance program and effective management of DM. 03-07-2021: Is working with the pharm D for ongoing support and education for medications        Review of patient status, including review of consultants reports, relevant laboratory and other test results, and medications completed;       Screening for signs and symptoms of depression related to chronic disease state;        Assessed social determinant of health barriers;         Osteoporosis  (Status: Goal on track: YES.) Evaluation of current treatment plan related to Osteoporosis,  self-management and patient's adherence to plan as established by provider. 03-07-2021: The patient is active and denies any issues. Review of safety precautions. 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  Discussed plans with patient for ongoing care management follow up and provided patient with direct contact information for care management team Advised patient to call the office for any falls,  fractures, or changes in osteoporosis health; Provided education to patient re: safety, being careful and prevention of falls, notifying provider of changes in condtions; Reviewed medications with patient and discussed compliance. 03-07-2021: The patient is compliant with medications, denies any new concerns; Reviewed scheduled/upcoming provider appointments including 12-17-2020 at [place] am, Appointment completed today. 03-07-2021: No upcoming appointments. Knows to call for changes; Discussed plans with patient for ongoing care management follow up and provided patient with direct contact information for care management team;  Hyperlipidemia:  (Status: Goal on track: YES.) Lab Results  Component Value Date   CHOL 122 12/17/2020   HDL 54 12/17/2020   LDLCALC 49 12/17/2020   TRIG 100 12/17/2020   CHOLHDL 2.3 12/17/2020   New lab work pending from today. Will review  results when they are available  Medication review performed; medication list updated in electronic medical record. 03-07-2021: Is compliant with medications.  Provider established cholesterol goals reviewed. 03-07-2021: Praised for cholesterol levels being at goal; Counseled on importance of regular laboratory monitoring as prescribed. 03-07-2021: Knows to get regular labs done for monitoring  Provided HLD educational materials; Reviewed role and benefits of statin for ASCVD risk reduction; Discussed strategies to manage statin-induced myalgias; Reviewed importance of limiting foods high in cholesterol. 03-07-2021: Discussed heart healthy/ADA diet. The patient eats chicken but not a lot of other meats. She likes vegetables and is considering adding more beans to her dietary intake. Discussed foods low cholesterol options;  Hypertension: (Status: Goal on track: YES.) Last practice recorded BP readings:  BP Readings from Last 3 Encounters:  12/17/20 130/86  08/27/20 140/77  07/09/20 122/70  Most recent eGFR/CrCl:  Lab Results  Component Value Date   EGFR 95 08/27/2020    No components found for: CRCL  Evaluation of current treatment plan related to hypertension self management and patient's adherence to plan as established by provider. 12-17-2020: The patient feels she is doing well with management of her HTN. Denies any headache pain. She is normotensive in the office today. 03-07-2021: Blood pressures are normal. No issues with HTN or heart health;   Provided education to patient re: stroke prevention, s/s of heart attack and stroke; Reviewed prescribed diet heart healthy/ADA.  12-17-2020: The patient is compliant with heart healthy/ADA diet. Glucerna samples provided today in the office. 03-07-2021: Review of heart healthy/ADA diet  Reviewed medications with patient and discussed importance of compliance. 03-07-2021: The patient endorses compliance with medications.   Discussed plans with  patient for ongoing care management follow up and provided patient with direct contact information for care management team; Advised patient, providing education and rationale, to monitor blood pressure daily and record, calling PCP for findings outside established parameters;  Reviewed scheduled/upcoming provider appointments including: 12-17-2020 at 0820 am- completed this appointment. Will have blood work and will follow up with the pcp in 6 months. 03-07-2021: Knows to call the office for changes or new needs  Pain:  (Status: Goal on Track (progressing): YES.) Pain assessment performed. 12-17-2020: The patient in the office today to follow up with pcp for chronic back pain and discomfort. Recommendations provided to take Tylenol Arthritis 650 mg BID.  The patient states this is the biggest issue she has right now. She states she has to work to pay her bills. She says when she has to do the physical labor this exacerbates her pain in her back. Education and support given. 03-07-2021: The patient states she has been so busy that she has not had time to think  about her pain. States she is doing well today. 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 Denies any acute findings. Will continue to monitor.  Medications reviewed. 03-07-2021: The pcp recommended the patient take Tylenol Arthritis BID for pain relief and discomfort. Reviewed provider established plan for pain management. 12-06-2020: The patient states her pain level is a 7 today on a scale of 0-10. 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 She plans on talking to the pcp about options for pain management on 12-17-2020. Heat application and Tylenol Arthritis are sometimes effective. She denies any acute distress but knows she needs some kind of relief from her chronic back pain. Reminded the patient to write down questions to ask the pcp at her upcoming appointment. Will  continue to monitor. 12-17-2020: Discussed pacing her activity and resting when she needs to rest. The patient states that she feels great other than the back pain. She is going to start taking Tylenol Arthritis 650 mg BID to see how this works for her. She does lay down when the pain is intense. Discussed limiting physical activity when pain is at its worse. Empathetic listening and support given. 03-07-2021: The patient states she is doing well and is staying very busy. States her pain is on the back burner and she is working on getting her Orthoptist" open and running.  Discussed importance of adherence to all scheduled medical appointments; Counseled on the importance of reporting any/all new or changed pain symptoms or management strategies to pain management provider; Advised patient to report to care team affect of pain on daily activities; Discussed use of relaxation techniques and/or diversional activities to assist with pain reduction (distraction, imagery, relaxation, massage, acupressure, TENS, heat, and cold application; Reviewed with patient prescribed pharmacological and nonpharmacological pain relief strategies; Advised patient to discuss unrelieved pain, changes in intensity and level of pain with provider; Screening for signs and symptoms of depression related to chronic disease state;  Assessed social determinant of health barriers;   Patient Goals/Self-Care Activities: Patient will self administer medications as prescribed as evidenced by self report/primary caregiver report  Patient will attend all scheduled provider appointments as evidenced by clinician review of documented attendance to scheduled appointments and patient/caregiver report Patient will call pharmacy for medication refills as evidenced by patient report and review of pharmacy fill history as appropriate Patient will attend church or other social activities as evidenced by patient report Patient will continue to  perform ADL's independently as evidenced by patient/caregiver report Patient will continue to perform IADL's independently as evidenced by patient/caregiver report Patient will call provider office for new concerns or questions as evidenced by review of documented incoming telephone call notes and patient report Patient will work with BSW to address care coordination needs and will continue to work with the clinical team to address health care and disease management related needs as evidenced by documented adherence to scheduled care management/care coordination appointments - schedule appointment with eye doctor - check blood sugar at prescribed times: before meals and at bedtime, when you have symptoms of low or high blood sugar, and before and after exercise - check feet daily for cuts, sores or redness - enter blood sugar readings and medication or insulin into daily log - take the blood sugar log to all doctor visits - trim toenails straight across - drink 6 to 8 glasses of water each day - eat fish at least  once per week - fill half of plate with vegetables - keep a food diary - limit fast food meals to no more than 1 per week - manage portion size - prepare main meal at home 3 to 5 days each week - read food labels for fat, fiber, carbohydrates and portion size - reduce red meat to 2 to 3 times a week - switch to sugar-free drinks - keep feet up while sitting - wash and dry feet carefully every day - wear comfortable, cotton socks - wear comfortable, well-fitting shoes - check blood pressure weekly - choose a place to take my blood pressure (home, clinic or office, retail store) - write blood pressure results in a log or diary - learn about high blood pressure - keep a blood pressure log - take blood pressure log to all doctor appointments - call doctor for signs and symptoms of high blood pressure - develop an action plan for high blood pressure - keep all doctor  appointments - take medications for blood pressure exactly as prescribed - report new symptoms to your doctor - eat more whole grains, fruits and vegetables, lean meats and healthy fats - call for medicine refill 2 or 3 days before it runs out - take all medications exactly as prescribed - call doctor with any symptoms you believe are related to your medicine - call doctor when you experience any new symptoms - go to all doctor appointments as scheduled - adhere to prescribed diet: heart healthy/ADA diet       Plan:Telephone follow up appointment with care management team member scheduled for:  05-02-2021 at 0900 am  Noreene Larsson RN, MSN, [facility] Mobile: [phone]

## 2021-03-07 NOTE — Patient Instructions (Signed)
Visit Information  Thank you for taking time to visit with me today. Please don't hesitate to contact me if I can be of assistance to you before our next scheduled telephone appointment.  Following are the goals we discussed today:  RNCM Clinical Goal(s):  Patient will verbalize understanding of plan for management of HTN, HLD, DMII, Osteoporosis, and chronic pain  verbalize basic understanding of HTN, HLD, DMII, Osteoporosis, and chronic pain disease process and self health management plan  take all medications exactly as prescribed and will call provider for medication related questions demonstrate understanding of rationale for each prescribed medication  attend all scheduled medical appointments: 12-17-2020 at 0820 am, saw pcp today. Awaiting results of lab work and then will be scheduled for a follow up. 03-07-2021: No upcoming appointments, knows to call for changes  demonstrate improved and ongoing adherence to prescribed treatment plan for HTN, HLD, DMII, Osteoporosis, and Chronic pain as evidenced by daily monitoring and recording of CBG  adherence to ADA/ carb modified diet adherence to prescribed medication regimen contacting provider for new or worsened symptoms or questions  demonstrate improved and ongoing health management independence  work with pharmacist to address DM management and medications needs related to DMII demonstrate a decrease in HTN, HLD, DMII, Osteoporosis, and chronic pain exacerbations  demonstrate ongoing self health care management ability for effective management of chronic conditions  through collaboration with RN Care manager, provider, and care team.    Interventions: 1:1 collaboration with primary care provider regarding development and update of comprehensive plan of care as evidenced by provider attestation and co-signature Inter-disciplinary care team collaboration (see longitudinal plan of care) Evaluation of current treatment plan related to  self  management and patient's adherence to plan as established by provider     SDOH Barriers (Status: Goal on track: YES.)  Patient interviewed and SDOH assessment performed        SDOH Interventions     Flowsheet Row Most Recent Value  SDOH Interventions    Food Insecurity Interventions Intervention Not Indicated  Financial Strain Interventions Other (Comment)  [patient assistance program for medications]  Housing Interventions Intervention Not Indicated  Intimate Partner Violence Interventions Intervention Not Indicated  Physical Activity Interventions Other (Comments)  [the patient is active and cleans houses, 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]  Stress Interventions Intervention Not Indicated  Social Connections Interventions Other (Comment)  [good support system]  Transportation Interventions Intervention Not Indicated       Patient interviewed and appropriate assessments performed Provided patient with information about resources available for SDOH needs  Discussed plans with patient for ongoing care management follow up and provided patient with direct contact information for care management team Advised patient to call the office for changes in [place], questions or concerns       Diabetes:  (Status: Goal on track: YES.)      Lab Results  Component Value Date    HGBA1C 7.5 (H) 12/17/2020  12-17-2020: Pending new results of new HGBA1C today in the office. 7.5% Assessed patient's understanding of A1c goal: <7% Provided education to patient about basic DM disease process; Reviewed medications with patient and discussed importance of medication adherence;        Reviewed prescribed diet with patient heart healthy/ADA. 12-17-2020: Supplied the patient with Glucerna samples to try to help with adding nutrients to her diet. 03-07-2021: The patient is eating well and denies any new issues with dietary restrictions ; Counseled on importance of regular laboratory monitoring  as  prescribed. 12-17-2020: The patient has pending lab work today. 03-07-2021: Has regular labwork. No acute findings;        Discussed plans with patient for ongoing care management follow up and provided patient with direct contact information for care management team;      Provided patient with written educational materials related to hypo and hyperglycemia and importance of correct treatment. 03-07-2021: Lowest blood sugar reading has been 87. Highest she has seen is 125       Reviewed scheduled/upcoming provider appointments including: 12-17-2020 at 0820 am, has completed appointment with pcp, knows to call for new concerns or needs, will follow up with the pcp in 6 months.     03-07-2021: No upcoming appointments with pcp, knows to call for changes     Advised patient, providing education and rationale, to check cbg as directed  and record. 03-07-2021:The patient is compliant with recommendations to check blood sugars. Gave range of blood sugars 87 to 125. Ask about the   freestyle [place]. Education given. The patient states she does not know that she needs this.     call provider for findings outside established parameters. 12-17-2020: States her blood sugars are doing well and states they are in the 100's. 03-07-2021: Knows to call for changes in condition and if her blood sugars are out of range      Referral made to pharmacy team for assistance with medications assistance program and effective management of DM. 03-07-2021: Is working with the pharm D for ongoing support and education for medications        Review of patient status, including review of consultants reports, relevant laboratory and other test results, and medications completed;       Screening for signs and symptoms of depression related to chronic disease state;        Assessed social determinant of health barriers;          Osteoporosis  (Status: Goal on track: YES.) Evaluation of current treatment plan related to Osteoporosis,   self-management and patient's adherence to plan as established by provider. 03-07-2021: The patient is active and denies any issues. Review of safety precautions. 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  Discussed plans with patient for ongoing care management follow up and provided patient with direct contact information for care management team Advised patient to call the office for any falls, fractures, or changes in osteoporosis health; Provided education to patient re: safety, being careful and prevention of falls, notifying provider of changes in condtions; Reviewed medications with patient and discussed compliance. 03-07-2021: The patient is compliant with medications, denies any new concerns; Reviewed scheduled/upcoming provider appointments including 12-17-2020 at [place] am, Appointment completed today. 03-07-2021: No upcoming appointments. Knows to call for changes; Discussed plans with patient for ongoing care management follow up and provided patient with direct contact information for care management team;   Hyperlipidemia:  (Status: Goal on track: YES.)      Lab Results  Component Value Date    CHOL 122 12/17/2020    HDL 54 12/17/2020    LDLCALC 49 12/17/2020    TRIG 100 12/17/2020    CHOLHDL 2.3 12/17/2020   New lab work pending from today. Will review results when they are available   Medication review performed; medication list updated in electronic medical record. 03-07-2021: Is compliant with medications.  Provider established cholesterol goals reviewed. 03-07-2021: Praised for cholesterol levels  being at goal; Counseled on importance of regular laboratory monitoring as prescribed. 03-07-2021: Knows to get regular labs done for monitoring  Provided HLD educational materials; Reviewed role and benefits of statin for ASCVD risk reduction; Discussed strategies to manage statin-induced myalgias; Reviewed importance of  limiting foods high in cholesterol. 03-07-2021: Discussed heart healthy/ADA diet. The patient eats chicken but not a lot of other meats. She likes vegetables and is considering adding more beans to her dietary intake. Discussed foods low cholesterol options;   Hypertension: (Status: Goal on track: YES.) Last practice recorded BP readings:     BP Readings from Last 3 Encounters:  12/17/20 130/86  08/27/20 140/77  07/09/20 122/70  Most recent eGFR/CrCl:       Lab Results  Component Value Date    EGFR 95 08/27/2020    No components found for: CRCL   Evaluation of current treatment plan related to hypertension self management and patient's adherence to plan as established by provider. 12-17-2020: The patient feels she is doing well with management of her HTN. Denies any headache pain. She is normotensive in the office today. 03-07-2021: Blood pressures are normal. No issues with HTN or heart health;   Provided education to patient re: stroke prevention, s/s of heart attack and stroke; Reviewed prescribed diet heart healthy/ADA.  12-17-2020: The patient is compliant with heart healthy/ADA diet. Glucerna samples provided today in the office. 03-07-2021: Review of heart healthy/ADA diet  Reviewed medications with patient and discussed importance of compliance. 03-07-2021: The patient endorses compliance with medications.   Discussed plans with patient for ongoing care management follow up and provided patient with direct contact information for care management team; Advised patient, providing education and rationale, to monitor blood pressure daily and record, calling PCP for findings outside established parameters;  Reviewed scheduled/upcoming provider appointments including: 12-17-2020 at 0820 am- completed this appointment. Will have blood work and will follow up with the pcp in 6 months. 03-07-2021: Knows to call the office for changes or new needs   Pain:  (Status: Goal on Track (progressing):  YES.) Pain assessment performed. 12-17-2020: The patient in the office today to follow up with pcp for chronic back pain and discomfort. Recommendations provided to take Tylenol Arthritis 650 mg BID.  The patient states this is the biggest issue she has right now. She states she has to work to pay her bills. She says when she has to do the physical labor this exacerbates her pain in her back. Education and support given. 03-07-2021: The patient states she has been so busy that she has not had time to think about her pain. States she is doing well today. 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 Denies any acute findings. Will continue to monitor.  Medications reviewed. 03-07-2021: The pcp recommended the patient take Tylenol Arthritis BID for pain relief and discomfort. Reviewed provider established plan for pain management. 12-06-2020: The patient states her pain level is a 7 today on a scale of 0-10. 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 She plans on talking to the pcp about options for pain management on 12-17-2020. Heat application and Tylenol Arthritis are sometimes effective. She denies any acute distress but knows she needs some kind of relief from her chronic back pain. Reminded the patient to write down questions to ask the pcp at her upcoming appointment. Will continue to monitor. 12-17-2020: Discussed pacing her activity and resting  when she needs to rest. The patient states that she feels great other than the back pain. She is going to start taking Tylenol Arthritis 650 mg BID to see how this works for her. She does lay down when the pain is intense. Discussed limiting physical activity when pain is at its worse. Empathetic listening and support given. 03-07-2021: The patient states she is doing well and is staying very busy. States her pain is on the back burner and she is working on getting her Orthoptist" open and  running.  Discussed importance of adherence to all scheduled medical appointments; Counseled on the importance of reporting any/all new or changed pain symptoms or management strategies to pain management provider; Advised patient to report to care team affect of pain on daily activities; Discussed use of relaxation techniques and/or diversional activities to assist with pain reduction (distraction, imagery, relaxation, massage, acupressure, TENS, heat, and cold application; Reviewed with patient prescribed pharmacological and nonpharmacological pain relief strategies; Advised patient to discuss unrelieved pain, changes in intensity and level of pain with provider; Screening for signs and symptoms of depression related to chronic disease state;  Assessed social determinant of health barriers;    Patient Goals/Self-Care Activities: Patient will self administer medications as prescribed as evidenced by self report/primary caregiver report  Patient will attend all scheduled provider appointments as evidenced by clinician review of documented attendance to scheduled appointments and patient/caregiver report Patient will call pharmacy for medication refills as evidenced by patient report and review of pharmacy fill history as appropriate Patient will attend church or other social activities as evidenced by patient report Patient will continue to perform ADL's independently as evidenced by patient/caregiver report Patient will continue to perform IADL's independently as evidenced by patient/caregiver report Patient will call provider office for new concerns or questions as evidenced by review of documented incoming telephone call notes and patient report Patient will work with BSW to address care coordination needs and will continue to work with the clinical team to address health care and disease management related needs as evidenced by documented adherence to scheduled care management/care coordination  appointments - schedule appointment with eye doctor - check blood sugar at prescribed times: before meals and at bedtime, when you have symptoms of low or high blood sugar, and before and after exercise - check feet daily for cuts, sores or redness - enter blood sugar readings and medication or insulin into daily log - take the blood sugar log to all doctor visits - trim toenails straight across - drink 6 to 8 glasses of water each day - eat fish at least once per week - fill half of plate with vegetables - keep a food diary - limit fast food meals to no more than 1 per week - manage portion size - prepare main meal at home 3 to 5 days each week - read food labels for fat, fiber, carbohydrates and portion size - reduce red meat to 2 to 3 times a week - switch to sugar-free drinks - keep feet up while sitting - wash and dry feet carefully every day - wear comfortable, cotton socks - wear comfortable, well-fitting shoes - check blood pressure weekly - choose a place to take my blood pressure (home, clinic or office, retail store) - write blood pressure results in a log or diary - learn about high blood pressure - keep a blood pressure log - take blood pressure log to all doctor appointments - call doctor for signs and symptoms  of high blood pressure - develop an action plan for high blood pressure - keep all doctor appointments - take medications for blood pressure exactly as prescribed - report new symptoms to your doctor - eat more whole grains, fruits and vegetables, lean meats and healthy fats - call for medicine refill 2 or 3 days before it runs out - take all medications exactly as prescribed - call doctor with any symptoms you believe are related to your medicine - call doctor when you experience any new symptoms - go to all doctor appointments as scheduled - adhere to prescribed diet: heart healthy/ADA diet      Our next appointment is by telephone on 05-02-2021 at 0900  am  Please call the care guide team at [phone] if you need to cancel or reschedule your appointment.   If you are experiencing a Mental Health or [place] or need someone to talk to, please call the Suicide and Crisis Lifeline: 988 call the [place] National Suicide Prevention Lifeline: 330-764-9179 or TTY: 858-764-4597 TTY 205-140-0049) to talk to a trained counselor call 1-800-273-TALK (toll free, 24 hour hotline)   Patient verbalizes understanding of instructions and care plan provided today and agrees to view in [place]. Active MyChart status confirmed with patient.    Noreene Larsson RN, MSN, [place] Mobile: [phone]

## 2021-03-15 ENCOUNTER — Encounter: Payer: Self-pay | Admitting: Family Medicine

## 2021-03-15 ENCOUNTER — Other Ambulatory Visit: Payer: Self-pay

## 2021-03-15 ENCOUNTER — Ambulatory Visit (INDEPENDENT_AMBULATORY_CARE_PROVIDER_SITE_OTHER): Payer: PPO | Admitting: Family Medicine

## 2021-03-15 VITALS — BP 113/80 | HR 85 | Ht 62.0 in | Wt 159.8 lb

## 2021-03-15 DIAGNOSIS — R112 Nausea with vomiting, unspecified: Secondary | ICD-10-CM | POA: Diagnosis not present

## 2021-03-15 DIAGNOSIS — K279 Peptic ulcer, site unspecified, unspecified as acute or chronic, without hemorrhage or perforation: Secondary | ICD-10-CM | POA: Diagnosis not present

## 2021-03-15 DIAGNOSIS — K219 Gastro-esophageal reflux disease without esophagitis: Secondary | ICD-10-CM

## 2021-03-15 MED ORDER — OMEPRAZOLE 40 MG PO CPDR: 40.0000 mg | DELAYED_RELEASE_CAPSULE | Freq: Every day | ORAL | 2 refills | Status: DC

## 2021-03-15 MED ORDER — SUCRALFATE 1 G PO TABS: 1.0000 g | ORAL_TABLET | Freq: Three times a day (TID) | ORAL | 0 refills | Status: DC

## 2021-03-15 NOTE — Progress Notes (Signed)
Subjective:    Patient ID: Summer Young, female    DOB: 06-Oct-1950, 71 y.o.   MRN: [id number]  Summer Young is a 71 y.o. female presenting on 03/15/2021 for Abdominal Pain   HPI  GERD vs PUD Reports symptoms onset 1 week prior had cucumber salad 6pm for dinner, normally she eats it for lunch but that was first time had it for dinner. She had vomiting and diarrhea few episodes into that evening and next day AM, has had resolved. 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   Past Surgical History:  Procedure Laterality Date   ANKLE SURGERY Left 2009   DILATION AND CURETTAGE, DIAGNOSTIC / THERAPEUTIC  1998   miscarriage   KNEE SURGERY Left 1994   ACL    Depression screen [facility] 2/9 03/15/2021 12/17/2020 12/06/2020  Decreased Interest 0 0 0  Down, Depressed, Hopeless 0 0 0  PHQ - 2 Score 0 0 0  Altered sleeping 0 0 -  Tired, decreased energy 0 1 -  Change in appetite 1 1 -  Feeling bad or failure about yourself  0 0 -  Trouble concentrating 0 1 -  Moving slowly or fidgety/restless 0 0 -  Suicidal thoughts 0 0 -  PHQ-9 Score 1 3 -  Difficult doing work/chores Not difficult at all Not difficult at all -    Social History   Tobacco Use   Smoking status: Never   Smokeless tobacco: Never  Vaping Use   Vaping Use: Never used  Substance Use Topics   Alcohol use: Yes    Alcohol/week: 0.0 - 1.0 standard drinks    Comment: occasional   Drug use: Never    Review of Systems Per HPI unless specifically indicated above     Objective:    BP 113/80    Pulse 85    Ht $R'5\' 2"'se$  (1.575 m)    Wt 159 lb 12.8 oz  (72.5 kg)    SpO2 100%    BMI 29.23 kg/m   Wt Readings from Last 3 Encounters:  03/15/21 159 lb 12.8 oz (72.5 kg)  12/17/20 161 lb 12.8 oz (73.4 kg)  08/27/20 162 lb 6.4 oz (73.7 kg)    Physical Exam Results for orders placed or performed in visit on 12/17/20  CBC  Result Value Ref Range   WBC 7.4 3.8 - 10.8 Thousand/uL   RBC 4.94 3.80 - 5.10 Million/uL   Hemoglobin 13.3 11.7 - 15.5 g/dL   HCT 42.8 35.0 - 45.0 %   MCV 86.6 80.0 - 100.0 fL   MCH 26.9 (L) 27.0 - 33.0 pg   MCHC 31.1 (L) 32.0 - 36.0 g/dL   RDW 13.4 11.0 - 15.0 %   Platelets 329 140 - 400 Thousand/uL   MPV 9.5 7.5 - 12.5 fL  COMPLETE METABOLIC PANEL WITH GFR  Result Value Ref Range   Glucose, Bld 111 65 - 139 mg/dL   BUN 14 7 - 25 mg/dL   Creat 0.78 0.50 - 1.05 mg/dL   eGFR 83 > OR = 60 mL/min/1.26m2   BUN/Creatinine Ratio  NOT APPLICABLE 6 - 22 (calc)   Sodium 140 135 - 146 mmol/L   Potassium 4.8 3.5 - 5.3 mmol/L   Chloride 106 98 - 110 mmol/L   CO2 25 20 - 32 mmol/L   Calcium 9.2 8.6 - 10.4 mg/dL   Total Protein 7.0 6.1 - 8.1 g/dL   Albumin 4.5 3.6 - 5.1 g/dL   Globulin 2.5 1.9 - 3.7 g/dL (calc)   AG Ratio 1.8 1.0 - 2.5 (calc)   Total Bilirubin 0.4 0.2 - 1.2 mg/dL   Alkaline phosphatase (APISO) 102 37 - 153 U/L   AST 17 10 - 35 U/L   ALT 18 6 - 29 U/L  Lipid panel  Result Value Ref Range   Cholesterol 122 <200 mg/dL   HDL 54 > OR = 50 mg/dL   Triglycerides 100 <150 mg/dL   LDL Cholesterol (Calc) 49 mg/dL (calc)   Total CHOL/HDL Ratio 2.3 <5.0 (calc)   Non-HDL Cholesterol (Calc) 68 <130 mg/dL (calc)  Hemoglobin A1c  Result Value Ref Range   Hgb A1c MFr Bld 7.5 (H) <5.7 % of total Hgb   Mean Plasma Glucose 169 mg/dL   eAG (mmol/L) 9.3 mmol/L  Urine Microalbumin w/creat. ratio  Result Value Ref Range   Creatinine, Urine 51 20 - 275 mg/dL   Microalb, Ur <0.2 mg/dL   Microalb Creat Ratio NOTE <30 mcg/mg creat      Assessment & Plan:   Problem List Items Addressed This Visit   None Visit  Diagnoses     Gastroesophageal reflux disease without esophagitis    -  Primary   Relevant Medications   omeprazole (PRILOSEC) 40 MG capsule   sucralfate (CARAFATE) 1 g tablet   Other Relevant Orders   H. pylori breath test   PUD (peptic ulcer disease)       Relevant Medications   omeprazole (PRILOSEC) 40 MG capsule   sucralfate (CARAFATE) 1 g tablet   Other Relevant Orders   H. pylori breath test   Nausea and vomiting, unspecified vomiting type       Relevant Medications   omeprazole (PRILOSEC) 40 MG capsule       Suspect GERD / PUD with stress lately. No other significant GI red flags (no unintentional wt loss, night-sweats, refractory abdominal pain n/v, hematemesis or melena).  PMHx Diverticulitis but history not consistent Cannot rule out Gallbladder symptoms however seems to not have localization to RUQ and not postprandial trigger  Plan: 1. Start prolonged PPI trial with Omeprazole 40mg  daily for 4 weeks, anticipate will need up to total 8 weeks if PUD 2. Today check H pylori breath test ([facility] - if positive, will increase PPI to BID dosing, add on triple therapy antibiotics with amoxicillin (1g BID) and clarithromycin (500mg  BID) for 2 weeks 3. Start carafate 1g TID WC + QHS PRN for symptom relief 4. Strict return criteria given for worsening symptoms 5. Follow-up if no improvement, will refer to GI for further evaluation, may need future EGD given chronicity of problem. If gradual improvement can f/u in office within 4 weeks    Meds ordered this encounter  Medications   omeprazole (PRILOSEC) 40 MG capsule    Sig: Take 1 capsule (40 mg total) by mouth daily before breakfast.    Dispense:  30 capsule    Refill:  2   sucralfate (CARAFATE) 1 g tablet    Sig: Take 1 tablet (1 g total) by mouth 4 (four) times daily -  with meals and  at bedtime. As needed only for abdominal pain    Dispense:  30 tablet    Refill:  0      Follow up plan: Return in about 6 weeks  (around 04/26/2021) for 6 week follow-up with PCP for GERD / Abdominal pain.   Nobie Putnam, [facility] 03/15/2021, 9:06 AM

## 2021-03-15 NOTE — Patient Instructions (Addendum)
Thank you for coming to the office today.  I am not sure the exact cause of your abdominal pain, however I am concerned that one significant possibility could be uncontrolled Acid Reflux (GERD) and may have developed an Ulcer (Peptic Ulcer of stomach).  After you have completed your H Pylori Breath Test today, we can start with the prescribed medicines, and we will notify you of your result and if we have to change treatment.  Before starting the prescribed treatment you need to complete the H Pylori Breath Test, either back at our office as soon as possible (Lab is open morning only 8am to 12noon, need a lab only appointment scheduled, and you need to be FASTING for 4 hours before the test, NO FOOD OR DRINK, only small amount of water is fine, can take other medicines).  IF BREATH TEST = NEGATIVE (or waiting for results):  Starting AFTER the test take Omeprazole 40mg  daily. Prefer to take this med about 30 min before breakfast or 1st meal of day for 4 weeks, don't stop taking unless we discuss first. Probably will need for about 8 weeks total if this ends up being an Ulcer.   Take other prescribed medicine Carafate (Sucralfate) as needed up to 4 times daily (3 meals and bedtime)  to coat stomach lining to ease symptoms, if it helps reduce symptoms then it is more likely to be due to acid and/or ulcer.  IF BREATH TEST = POSITIVE (we notified you of this result): - CHANGE Protonix dose from 40mg  daily to 40mg  (one pill) TWICE daily, about 30 min before breakfast and dinner - FOR TWO WEEKS, then resume DAILY treatment - Also we will send in TWO antibiotics to take IN ADDITION: - Amoxicillin 1g TWICE daily Clarithromycin 500mg  TWICE daily - both for 2 weeks   DIET RECOMMENDATIONS - Avoid spicy, greasy, fried foods, also things like caffeine, dark chocolate, peppermint can worsen - Avoid large meals and late night snacks, also do not go more than 4-5 hours without a snack or meal (not eating will  worsen reflux symptoms due to stomach acid) - You may also elevate the head of your bed at night to sleep at very slight incline to help reduce symptoms   If the problem improves but keeps coming back, we can discuss higher dose or longer course at next visit.   If symptoms are worsening or persistent despite treatment or develop any different severe esophagus or abdominal pain, unable to swallow solids or liquids, nausea, vomiting especially blood in vomit, fever/chills, or unintentional weight loss / no appetite, please follow-up sooner in office or seek more immediate medical attention at hospital Emergency Department.  Regarding other medicines:   - STOP taking Ibuprofen, Advil, Motrin, Goody's / BC powder - DO NOT take without discussing with your doctor. These medicines can put you at high risk for future bleeding.  If need pain medicine, may take Tylenol Extra Strength (Acetaminophen) 500mg  tabs - take 1 to 2 tabs per dose (max 1000mg ) every 6-8 hours for pain (take regularly, don't skip a dose for next 7 days), max 24 hour daily dose is 6 tablets or 3000mg . In the future you can repeat the same everyday Tylenol course for 1-2 weeks at a time.   Please schedule a Follow-up Appointment to: Return in about 6 weeks (around 04/26/2021) for 6 week follow-up with PCP for GERD / Abdominal pain.  If you have any other questions or concerns, please feel free to call  the office or send a message through [place]. You may also schedule an earlier appointment if necessary.  Additionally, you may be receiving a survey about your experience at our office within a few days to 1 week by e-mail or mail. We value your feedback.  Nobie Putnam, DO [place]

## 2021-03-16 LAB — H. PYLORI BREATH TEST: H pylori Breath Test: NEGATIVE

## 2021-03-17 ENCOUNTER — Encounter: Payer: Self-pay | Admitting: Internal Medicine

## 2021-03-18 ENCOUNTER — Other Ambulatory Visit: Payer: Self-pay | Admitting: Pharmacist

## 2021-03-18 ENCOUNTER — Ambulatory Visit: Payer: PPO | Admitting: Pharmacist

## 2021-03-18 DIAGNOSIS — E1165 Type 2 diabetes mellitus with hyperglycemia: Secondary | ICD-10-CM

## 2021-03-18 DIAGNOSIS — E782 Mixed hyperlipidemia: Secondary | ICD-10-CM

## 2021-03-18 DIAGNOSIS — K219 Gastro-esophageal reflux disease without esophagitis: Secondary | ICD-10-CM

## 2021-03-18 MED ORDER — OZEMPIC (1 MG/DOSE) 2 MG/1.5ML ~~LOC~~ SOPN: PEN_INJECTOR | SUBCUTANEOUS | 0 refills | Status: DC

## 2021-03-18 NOTE — Telephone Encounter (Signed)
Patient requesting Rx for 1 month supply of Ozempic to local pharmacy.  Would you please send this to [place] for patient?  Note patient currently receives Ozempic from patient assistance program, but requests to have this Rx at [facility] as back up in case medication does not arrive in time from program.   Wallace Cullens, PharmD, [place] [phone]

## 2021-03-18 NOTE — Chronic Care Management (AMB) (Signed)
Chronic Care Management CCM Pharmacy Note  03/18/2021 Name:  Summer Young MRN:  [id number] DOB:  Aug 16, 1950   Subjective: Summer Young is an 71 y.o. year old female who is a primary patient of Summer Fenton, NP.  The CCM team was consulted for assistance with disease management and care coordination needs.    Engaged with patient by telephone for follow up visit for pharmacy case management and/or care coordination services.   Objective:  Medications Reviewed Today     Reviewed by Rennis Petty, RPH-CPP (Pharmacist) on 03/18/21 at [place] List Status: <None>   Medication Order Taking? Sig Documenting Provider Last Dose Status Informant  acetaminophen (TYLENOL) 650 MG CR tablet [id number]  Take 650 mg by mouth 2 (two) times daily as needed for pain. [provider]  Active   alendronate (FOSAMAX) 70 MG tablet [id number] Yes Take 1 tablet (70 mg total) by mouth once a week. Take with a full glass of water on an empty stomach. Olin Hauser, DO Taking Active   Ascorbic Acid (VITAMIN C) 1000 MG tablet [id number]  Take 1,000 mg by mouth daily as needed.  [provider]  Active            Med Note Winfield Cunas, [facility] A   Mon Sep 20, 2018  4:05 PM) As needed to prevent illness  atorvastatin (LIPITOR) 40 MG tablet [id number] Yes Take 1 tablet by mouth once daily Summer Fenton, NP Taking Active   Biotin 1000 MCG tablet [id number]  Take 4,000 mcg by mouth every other day.  [provider]  Active Self           Med Note [facility] Ellison   Tue Jan 19, 2018 11:31 AM) [name] every other day  blood glucose meter kit and supplies [id number]  Dispense based on patient and insurance preference. Use up to four times daily as directed. (FOR ICD-10 E10.9, E11.9). [name], FNP  Active   Cholecalciferol (VITAMIN D3) 50 MCG (2000 UT) capsule [id number]  Take 1,000 Units by mouth daily.  [provider]  Active   insulin degludec (TRESIBA FLEXTOUCH) 100  UNIT/ML SOPN FlexTouch Pen [id number] Yes Inject 0.26 mLs (26 Units total) into the skin daily.  Patient taking differently: Inject 20 Units into the skin daily.   [name], NP Taking Active   metFORMIN (GLUCOPHAGE-XR) 500 MG 24 hr tablet [id number] Yes TAKE 3 TABLETS BY MOUTH ONCE DAILY WITH BREAKFAST Summer Fenton, NP Taking Active   omeprazole (PRILOSEC) 40 MG capsule [id number] Yes Take 1 capsule (40 mg total) by mouth daily before breakfast. Olin Hauser, DO Taking Active   [facility] ULTRA test strip [id number]  Check blood sugar 1 x daily as directed Olin Hauser, DO  Active   polyethylene glycol (MIRALAX / GLYCOLAX) 17 g packet [id number]  Take 17 g by mouth daily as needed. [provider]  Active   Semaglutide, 1 MG/DOSE, (OZEMPIC, 1 MG/DOSE,) 2 MG/1.5ML SOPN [id number] Yes INJECT 1 MG SUBCUTANEOUSLY ONCE A WEEK Karamalegos, Devonne Doughty, DO Taking Active   spironolactone (ALDACTONE) 25 MG tablet [id number] Yes Take 1 tablet by mouth once daily Summer Fenton, NP Taking Active   sucralfate (CARAFATE) 1 g tablet [id number] No Take 1 tablet (1 g total) by mouth 4 (four) times daily -  with meals and at bedtime. As needed only for abdominal pain  Patient not taking: Reported on 03/18/2021   Olin Hauser, DO  Not Taking Active   vitamin B-12 (CYANOCOBALAMIN) 1000 MCG tablet [id number]  Take 1,000 mcg by mouth daily.  [provider]  Active             Pertinent Labs:  Lab Results  Component Value Date   HGBA1C 7.5 (H) 12/17/2020   Lab Results  Component Value Date   CHOL 122 12/17/2020   HDL 54 12/17/2020   LDLCALC 49 12/17/2020   TRIG 100 12/17/2020   CHOLHDL 2.3 12/17/2020   Lab Results  Component Value Date   CREATININE 0.78 12/17/2020   BUN 14 12/17/2020   NA 140 12/17/2020   K 4.8 12/17/2020   CL 106 12/17/2020   CO2 25 12/17/2020   BP Readings from Last 3 Encounters:  03/15/21 113/80  12/17/20 130/86   08/27/20 140/77   Pulse Readings from Last 3 Encounters:  03/15/21 85  12/17/20 76  08/27/20 85     SDOH:  (Social Determinants of Health) assessments and interventions performed:    Summer Young  Review of patient past medical history, allergies, medications, health status, including review of consultants reports, laboratory and other test data, was performed as part of comprehensive evaluation and provision of chronic care management services.   Care Plan : General Pharmacy (Adult)  Updates made by Rennis Petty, RPH-CPP since 03/18/2021 12:00 AM     Problem: Disease Progression      Long-Range Goal: Disease Progression Prevented or Minimized   Start Date: 01/23/2020  Expected End Date: 04/22/2020  This Visit's Progress: On track  Recent Progress: On track  Priority: High  Note:   Current Barriers:  Unable to independently afford treatment regimen Patient APPROVED for [name] and Ozempic medication assistance from [employer] for 2023 calendar year  Pharmacist Clinical Goal(s):  Over the next 90 days, patient will achieve control of T2DM as evidenced by A1C <7.0%. through collaboration with PharmD and provider.  Over the next 90 days, patient will maintain control of LDL as evidenced by LDL <70 mg/dL through collaboration with PharmD and provider.  Over the next 90 days, patient will verbalize ability to afford treatment regimen. through collaboration with PharmD and provider.  Interventions: Inter-disciplinary care team collaboration (see longitudinal plan of care) 1:1 collaboration with Summer Fenton, NP regarding development and update of comprehensive plan of care as evidenced by provider attestation and co-signature Perform chart review.  Patient seen for Office Visit with Dr. Parks Ranger on 1/27 for abdominal pain. Provider advised: Start prolonged PPI trial with Omeprazole $RemoveBeforeD'40mg'[id number]$  daily for 4 weeks, anticipate will need up to total 8 weeks if PUD Today check  H pylori breath test Start carafate 1g TID WC + QHS PRN for symptom relief From review of chart, note result of H pylori Breath Test from 03/15/2021 is negative Today patient reports significant improvement in reflux symptoms since started taking omeprazole 40 mg daily as directed. Reports sleeping better Confirms taking ~30 minutes before breakfast Reports not taking Carafate as not needing - symptoms controlled by omeprazole.  Counsel patient if uses Carafate, to separate from administration of other oral medications by at least 2 hours  Medication Assistance Collaborated with [place] Simcox for aid to patient with re-applying [place] and Ozempic medication assistance from [employer] for 2023 calendar year From review of chart, [facility] CPhT contacted [employer] on 12/28 and was advised patient APPROVED 02/17/21-02/16/22 for program re-enrollment Patient reports has not yet received refill of Ozempic and has ~2 weeks of  this medication remaining (states has plenty of [place]) Counsel patient to follow up with [employer] for further information on when refill will arrive Patient prefers to instead pick up supply of Ozempic from local pharmacy if does not receive supply in time from program Will collaborate with clinical team to request 1 month supply of Ozempic be sent to local pharmacy  Diabetes: Current treatment: Tresiba 20 units once daily Metformin ER 500 mg - 3 tablets daily with breakfast Ozempic 1 mg once weekly Reports fasting blood sugars from past week ranging: 95-125 Discuss importance of monitoring for s/s of hypoglycemia Note patient carries glucose tablets with her Discuss importance of having well-balanced meals spread throughout the day, while limiting carbohydrate portion sizes Will try Glucerna or Boost Glucose Control to help with lunch time meal 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; not been to the gym in ~2 weeks Discuss  walking/home exercises at home through health plan  Hyperlipidemia: Controlled; current treatment: atorvastatin 40 mg daily  Have counseled patient on importance of medication adherence Have discussed dietary management   Patient Goals/Self-Care Activities Over the next 90 days, patient will:  Take medications as prescribed Check blood glucose daily, document, and provide at future appointments Keeps log in App on phone Collaborate with provider on medication access solutions Attends all scheduled provider appointments Calls pharmacy for medication refills Calls provider office for new concerns or questions  Follow Up Plan: Telephone follow up appointment with care management team member scheduled for: 04/12/2021 at 8:30 am      Wallace Cullens, PharmD, [place], [place] [phone]

## 2021-03-18 NOTE — Patient Instructions (Signed)
Visit Information  Thank you for taking time to visit with me today. Please don't hesitate to contact me if I can be of assistance to you before our next scheduled telephone appointment.  Following are the goals we discussed today:   Goals Addressed             This Visit's Progress    Pharmacy Goals       Our goal A1c is less than 7%. This corresponds with fasting sugars less than 130 and 2 hour after meal sugars less than 180. Please check your blood sugars and keep log of results  Our goal bad cholesterol, or LDL, is less than 70 . This is why it is important to continue taking your atorvastatin  Please check your home blood pressure, keep a log of the results and bring this with you to your medical appointments.  Feel free to call me with any questions or concerns. I look forward to our next call!   Wallace Cullens, PharmD, [place] [phone]          Our next appointment is by telephone on 04/12/2021 at 8:30 am  Please call the care guide team at [phone] if you need to cancel or reschedule your appointment.    Patient verbalizes understanding of instructions and care plan provided today and agrees to view in [place]. Active MyChart status confirmed with patient.

## 2021-04-02 ENCOUNTER — Other Ambulatory Visit: Payer: Self-pay

## 2021-04-02 DIAGNOSIS — M810 Age-related osteoporosis without current pathological fracture: Secondary | ICD-10-CM

## 2021-04-02 MED ORDER — ALENDRONATE SODIUM 70 MG PO TABS: 70.0000 mg | ORAL_TABLET | ORAL | 1 refills | Status: DC

## 2021-04-10 ENCOUNTER — Telehealth: Payer: Self-pay | Admitting: Internal Medicine

## 2021-04-10 DIAGNOSIS — E1165 Type 2 diabetes mellitus with hyperglycemia: Secondary | ICD-10-CM

## 2021-04-10 DIAGNOSIS — Z794 Long term (current) use of insulin: Secondary | ICD-10-CM

## 2021-04-10 NOTE — Telephone Encounter (Signed)
While on the phone with patient in regards to AWV, patient asked if her diabetic medications have arrived at the office. I advised patient I would send a message and someone from the office would call her in regards to this. Please call patient with an update, thank you.

## 2021-04-11 NOTE — Telephone Encounter (Signed)
Summer Ormond, do you know anything about this pt's [name]?

## 2021-04-12 ENCOUNTER — Ambulatory Visit (INDEPENDENT_AMBULATORY_CARE_PROVIDER_SITE_OTHER): Payer: PPO | Admitting: Pharmacist

## 2021-04-12 DIAGNOSIS — Z794 Long term (current) use of insulin: Secondary | ICD-10-CM

## 2021-04-12 DIAGNOSIS — E1165 Type 2 diabetes mellitus with hyperglycemia: Secondary | ICD-10-CM

## 2021-04-12 DIAGNOSIS — E782 Mixed hyperlipidemia: Secondary | ICD-10-CM

## 2021-04-12 MED ORDER — OZEMPIC (1 MG/DOSE) 2 MG/1.5ML ~~LOC~~ SOPN: PEN_INJECTOR | SUBCUTANEOUS | 0 refills | Status: DC

## 2021-04-12 MED ORDER — TRESIBA FLEXTOUCH 100 UNIT/ML ~~LOC~~ SOPN: 26.0000 [IU] | PEN_INJECTOR | Freq: Every day | SUBCUTANEOUS | 0 refills | Status: AC

## 2021-04-12 NOTE — Addendum Note (Signed)
Addended by: [name] on: 04/12/2021 01:43 PM   Modules accepted: Orders

## 2021-04-12 NOTE — Telephone Encounter (Signed)
Medication sent to [place]

## 2021-04-12 NOTE — Chronic Care Management (AMB) (Signed)
Chronic Care Management CCM Pharmacy Note  04/12/2021 Name:  Summer Young MRN:  [id number] DOB:  Apr 13, 1950   Subjective: Summer Young is an 71 y.o. year old female who is a primary patient of [name], NP.  The CCM team was consulted for assistance with disease management and care coordination needs.    Engaged with patient by telephone for follow up visit for pharmacy case management and/or care coordination services.   Objective:  Medications Reviewed Today     Reviewed by Rennis Petty, RPH-CPP (Pharmacist) on 03/18/21 at [place] List Status: <None>   Medication Order Taking? Sig Documenting Provider Last Dose Status Informant  acetaminophen (TYLENOL) 650 MG CR tablet [id number]  Take 650 mg by mouth 2 (two) times daily as needed for pain. [provider]  Active   alendronate (FOSAMAX) 70 MG tablet [id number] Yes Take 1 tablet (70 mg total) by mouth once a week. Take with a full glass of water on an empty stomach. Olin Hauser, DO Taking Active   Ascorbic Acid (VITAMIN C) 1000 MG tablet [id number]  Take 1,000 mg by mouth daily as needed.  [provider]  Active            Med Note Winfield Cunas, [facility] A   Mon Sep 20, 2018  4:05 PM) As needed to prevent illness  atorvastatin (LIPITOR) 40 MG tablet [id number] Yes Take 1 tablet by mouth once daily [name], NP Taking Active   Biotin 1000 MCG tablet [id number]  Take 4,000 mcg by mouth every other day.  [provider]  Active Self           Med Note Bar[facility] Ellison   Tue Jan 19, 2018 11:31 AM) [name] every other day  blood glucose meter kit and supplies [id number]  Dispense based on patient and insurance preference. Use up to four times daily as directed. (FOR ICD-10 E10.9, E11.9). [name], FNP  Active   Cholecalciferol (VITAMIN D3) 50 MCG (2000 UT) capsule [id number]  Take 1,000 Units by mouth daily.  [provider]  Active   insulin degludec (TRESIBA FLEXTOUCH) 100  UNIT/ML SOPN FlexTouch Pen [id number] Yes Inject 0.26 mLs (26 Units total) into the skin daily.  Patient taking differently: Inject 20 Units into the skin daily.   [name], NP Taking Active   metFORMIN (GLUCOPHAGE-XR) 500 MG 24 hr tablet [id number] Yes TAKE 3 TABLETS BY MOUTH ONCE DAILY WITH BREAKFAST [name], NP Taking Active   omeprazole (PRILOSEC) 40 MG capsule [id number] Yes Take 1 capsule (40 mg total) by mouth daily before breakfast. Olin Hauser, DO Taking Active   [facility] ULTRA test strip [id number]  Check blood sugar 1 x daily as directed Olin Hauser, DO  Active   polyethylene glycol (MIRALAX / GLYCOLAX) 17 g packet [id number]  Take 17 g by mouth daily as needed. [provider]  Active   Semaglutide, 1 MG/DOSE, (OZEMPIC, 1 MG/DOSE,) 2 MG/1.5ML SOPN [id number] Yes INJECT 1 MG SUBCUTANEOUSLY ONCE A WEEK Karamalegos, Devonne Doughty, DO Taking Active   spironolactone (ALDACTONE) 25 MG tablet [id number] Yes Take 1 tablet by mouth once daily [name], NP Taking Active   sucralfate (CARAFATE) 1 g tablet [id number] No Take 1 tablet (1 g total) by mouth 4 (four) times daily -  with meals and at bedtime. As needed only for abdominal pain  Patient not taking: Reported on 03/18/2021   Olin Hauser, DO  Not Taking Active   vitamin B-12 (CYANOCOBALAMIN) 1000 MCG tablet [id number]  Take 1,000 mcg by mouth daily.  [provider]  Active             Pertinent Labs:  Lab Results  Component Value Date   HGBA1C 7.5 (H) 12/17/2020   Lab Results  Component Value Date   CHOL 122 12/17/2020   HDL 54 12/17/2020   LDLCALC 49 12/17/2020   TRIG 100 12/17/2020   CHOLHDL 2.3 12/17/2020   Lab Results  Component Value Date   CREATININE 0.78 12/17/2020   BUN 14 12/17/2020   NA 140 12/17/2020   K 4.8 12/17/2020   CL 106 12/17/2020   CO2 25 12/17/2020    SDOH:  (Social Determinants of Health) assessments and interventions  performed:    CCM Care Plan  Review of patient past medical history, allergies, medications, health status, including review of consultants reports, laboratory and other test data, was performed as part of comprehensive evaluation and provision of chronic care management services.   Care Plan : General Pharmacy (Adult)  Updates made by Rennis Petty, RPH-CPP since 04/12/2021 12:00 AM     Problem: Disease Progression      Long-Range Goal: Disease Progression Prevented or Minimized   Start Date: 01/23/2020  Expected End Date: 04/22/2020  This Visit's Progress: On track  Recent Progress: On track  Priority: High  Note:   Current Barriers:  Unable to independently afford treatment regimen Patient APPROVED for [name] and Ozempic medication assistance from [employer] for 2023 calendar year  Pharmacist Clinical Goal(s):  Over the next 90 days, patient will achieve control of T2DM as evidenced by A1C <7.0%. through collaboration with PharmD and provider.  Over the next 90 days, patient will maintain control of LDL as evidenced by LDL <70 mg/dL through collaboration with PharmD and provider.  Over the next 90 days, patient will verbalize ability to afford treatment regimen. through collaboration with PharmD and provider.  Interventions: Inter-disciplinary care team collaboration (see longitudinal plan of care) 1:1 collaboration with [name], NP regarding development and update of comprehensive plan of care as evidenced by provider attestation and co-signature Receive message from PCP advising patient contacted office to see if medication from assistance program had been received Collaborate with Mickel Baas CMA with PCP - reports office has not yet received patient's medications from assistance program  Medication Assistance Collaborated with [place] Simcox for aid to patient with re-applying [place] and Ozempic medication assistance from [employer] for 2023 calendar  year From review of chart, [facility] CPhT contacted [employer] on 12/28 and was advised patient APPROVED 02/17/21-02/16/22 for program re-enrollment Patient has not yet received supply of Ozempic and Tresiba from assistance program. Reports has been taking Ozempic from 30 day supply she picked up from [place] last month and has only ~1 week of this medication remaining Place call to [employer] assistance program today. Speak with representative who states patient's Joni Reining and pen needles Rxs started to process in January, but does not currently have a shipment date Request voucher for patient to receive supply of Tresiba and Ozempic from local pharmacy Receive 30-day supply voucher for Ozempic [id number]  PCN: [id number] GRP NUM: [id number] Member id  [id number] Receive 30-day supply voucher for [name] [id number]  PCN: [id number] GRP NUM: [id number] Member id  [id number] Follow up with patient/PCP. Request PCP send 30 day supply Rx of both Tresiba and Ozempic to [facility] for patient Follow  up with [facility] - confirm medications in stock and provide voucher information. Pharmacy confirms both Rx successfully billed through for $0 copayment  Diabetes: Current treatment: Tresiba 20 units once daily Metformin ER 500 mg - 3 tablets daily with breakfast Ozempic 1 mg once weekly Reports fasting blood sugars from past week ranging: 87-149 Reports had self-increased Tresiba to 21 units daily as had noticed her CBGs were elevated, but today identify patient only taking metformin ER 500 - 2 tablets daily, rather than 3 tablets Patient reports will decrease Tresiba back to 20 units daily, resume taking metformin ER 500 mg -3 tablets daily and continue Ozempic 1 mg weekly Counsel patient on monitoring for s/s of hypoglycemia and management Note patient carries glucose tablets with her Discuss importance of having well-balanced meals spread throughout the day, while limiting  carbohydrate portion sizes Considering trying Glucerna or Boost Glucose Control to help with lunch time meal 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; 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 the gym, but started doing dance workouts at home 1-2 times/week  Hyperlipidemia: Controlled; current treatment: atorvastatin 40 mg daily  Have counseled patient on importance of medication adherence Have discussed dietary management   Patient Goals/Self-Care Activities Over the next 90 days, patient will:  Take medications as prescribed Check blood glucose daily, document, and provide at future appointments Keeps log in App on phone Collaborate with provider on medication access solutions Attends all scheduled provider appointments Calls pharmacy for medication refills Calls provider office for new concerns or questions  Follow Up Plan: Telephone follow up appointment with care management team member scheduled for: 04/22/2021 at 8:30 AM      Wallace Cullens, PharmD, [place], [place] [phone]

## 2021-04-12 NOTE — Patient Instructions (Signed)
Visit Information  Thank you for taking time to visit with me today. Please don't hesitate to contact me if I can be of assistance to you before our next scheduled telephone appointment.  Following are the goals we discussed today:   Goals Addressed             This Visit's Progress    Pharmacy Goals       Our goal A1c is less than 7%. This corresponds with fasting sugars less than 130 and 2 hour after meal sugars less than 180. Please check your blood sugars and keep log of results  Our goal bad cholesterol, or LDL, is less than 70 . This is why it is important to continue taking your atorvastatin  Please check your home blood pressure, keep a log of the results and bring this with you to your medical appointments.  Feel free to call me with any questions or concerns. I look forward to our next call!    Wallace Cullens, PharmD, [place] [phone]          Our next appointment is by telephone on 04/22/2021 at 8:30 AM  Please call the care guide team at [phone] if you need to cancel or reschedule your appointment.    Patient verbalizes understanding of instructions and care plan provided today and agrees to view in [facility]. Active MyChart status confirmed with patient.

## 2021-04-22 ENCOUNTER — Ambulatory Visit (INDEPENDENT_AMBULATORY_CARE_PROVIDER_SITE_OTHER): Payer: PPO | Admitting: Pharmacist

## 2021-04-22 DIAGNOSIS — Z794 Long term (current) use of insulin: Secondary | ICD-10-CM

## 2021-04-22 DIAGNOSIS — E1165 Type 2 diabetes mellitus with hyperglycemia: Secondary | ICD-10-CM

## 2021-04-22 NOTE — Patient Instructions (Signed)
Visit Information ? ?Thank you for taking time to visit with me today. Please don't hesitate to contact me if I can be of assistance to you before our next scheduled telephone appointment. ? ?Following are the goals we discussed today:  ? Goals Addressed   ? ?  ?  ?  ?  ? This Visit's Progress  ?  Pharmacy Goals     ?  Our goal A1c is less than 7%. This corresponds with fasting sugars less than 130 and 2 hour after meal sugars less than 180. Please check your blood sugars and keep log of results ? ?Our goal bad cholesterol, or LDL, is less than 70 . This is why it is important to continue taking your atorvastatin ? ?Please check your home blood pressure, keep a log of the results and bring this with you to your medical appointments. ? ?Feel free to call me with any questions or concerns. I look forward to our next call! ? ? ?Wallace Cullens, PharmD, BCACP ?Clinical Pharmacist ?[facility] ?[place] ?[phone] ? ?  ? ?  ? ? ? ?Our next appointment is by telephone on 05/20/2021 at 8:30 am ? ?Please call the care guide team at [phone] if you need to cancel or reschedule your appointment.  ? ? ?Patient verbalizes understanding of instructions and care plan provided today and agrees to view in [facility]. Active MyChart status confirmed with patient.   ? ?

## 2021-04-22 NOTE — Chronic Care Management (AMB) (Signed)
? ?Chronic Care Management ?CCM Pharmacy Note ? ?04/22/2021 ?Name:  Summer Young MRN:  [id number] DOB:  December 02, 1950 ? ? ?Subjective: ?Summer Young is an 71 y.o. year old female who is a primary patient of Summer Fenton, NP.  The CCM team was consulted for assistance with disease management and care coordination needs.   ? ?Engaged with patient by telephone for follow up visit for pharmacy case management and/or care coordination services.  ? ?Objective: ? ?Medications Reviewed Today   ? ? Reviewed by Rennis Petty, RPH-CPP (Pharmacist) on 04/22/21 at [place] List Status: <None>  ? ?Medication Order Taking? Sig Documenting Provider Last Dose Status Informant  ?acetaminophen (TYLENOL) 650 MG CR tablet [id number] Yes Take 650 mg by mouth 2 (two) times daily as needed for pain. [provider] Taking Active   ?alendronate (FOSAMAX) 70 MG tablet [id number]  Take 1 tablet (70 mg total) by mouth once a week. Take with a full glass of water on an empty stomach. Olin Hauser, DO  Active   ?Ascorbic Acid (VITAMIN C) 1000 MG tablet [id number]  Take 1,000 mg by mouth daily as needed.  [provider]  Active   ?         ?Med Note [facility], Summer Young A   Mon Sep 20, 2018  4:05 PM) As needed to prevent illness  ?atorvastatin (LIPITOR) 40 MG tablet [id number] Yes Take 1 tablet by mouth once daily Summer Fenton, NP Taking Active   ?Biotin 1000 MCG tablet [id number]  Take 4,000 mcg by mouth every other day.  [provider]  Active Self  ?         ?Med Note [facility]   Tue Jan 19, 2018 11:31 AM) Summer Young every other day  ?blood glucose meter kit and supplies [id number]  Dispense based on patient and insurance preference. Use up to four times daily as directed. (FOR ICD-10 E10.9, E11.9). Summer, Lupita Raider, FNP  Active   ?Cholecalciferol (VITAMIN D3) 50 MCG (2000 UT) capsule [id number]  Take 1,000 Units by mouth daily.  [provider]  Active   ?insulin degludec (TRESIBA FLEXTOUCH) 100  UNIT/ML FlexTouch Pen [id number] Yes Inject 26 Units into the skin daily.  ?Patient taking differently: Inject 20 Units into the skin daily.  ? Summer Fenton, NP Taking Active   ?metFORMIN (GLUCOPHAGE-XR) 500 MG 24 hr tablet [id number] Yes TAKE 3 TABLETS BY MOUTH ONCE DAILY WITH BREAKFAST Summer Fenton, NP Taking Active   ?omeprazole (PRILOSEC) 40 MG capsule [id number]  Take 1 capsule (40 mg total) by mouth daily before breakfast. Olin Hauser, DO  Active   ?ONETOUCH ULTRA test strip [id number]  Check blood sugar 1 x daily as directed Olin Hauser, DO  Active   ?polyethylene glycol (MIRALAX / GLYCOLAX) 17 g packet [id number]  Take 17 g by mouth daily as needed. [provider]  Active   ?Semaglutide, 1 MG/DOSE, (OZEMPIC, 1 MG/DOSE,) 2 MG/1.5ML SOPN [id number] Yes INJECT 1 MG SUBCUTANEOUSLY ONCE A WEEK Summer, Coralie Keens, NP Taking Active   ?spironolactone (ALDACTONE) 25 MG tablet [id number]  Take 1 tablet by mouth once daily Summer Fenton, NP  Active   ?sucralfate (CARAFATE) 1 g tablet [id number]  Take 1 tablet (1 g total) by mouth 4 (four) times daily -  with meals and at bedtime. As needed only for abdominal pain  ?Patient not taking: Reported on 03/18/2021  ? Olin Hauser, DO  Active   ?  vitamin B-12 (CYANOCOBALAMIN) 1000 MCG tablet [id number]  Take 1,000 mcg by mouth daily.  [provider]  Active   ? ?  ?  ? ?  ? ? ?Pertinent Labs:  ?Lab Results  ?Component Value Date  ? HGBA1C 7.5 (H) 12/17/2020  ? ?Lab Results  ?Component Value Date  ? CHOL 122 12/17/2020  ? HDL 54 12/17/2020  ? LDLCALC 49 12/17/2020  ? TRIG 100 12/17/2020  ? CHOLHDL 2.3 12/17/2020  ? ?Lab Results  ?Component Value Date  ? CREATININE 0.78 12/17/2020  ? BUN 14 12/17/2020  ? NA 140 12/17/2020  ? K 4.8 12/17/2020  ? CL 106 12/17/2020  ? CO2 25 12/17/2020  ? ? ?SDOH:  (Social Determinants of Health) assessments and interventions performed:  ? ? ?CCM Care Plan ? ?Review of patient past medical  history, allergies, medications, health status, including review of consultants reports, laboratory and other test data, was performed as part of comprehensive evaluation and provision of chronic care management services.  ? ?Care Plan : General Pharmacy (Adult)  ?Updates made by Rennis Petty, RPH-CPP since 04/22/2021 12:00 AM  ?  ? ?Problem: Disease Progression   ?  ? ?Long-Range Goal: Disease Progression Prevented or Minimized   ?Start Date: 01/23/2020  ?Expected End Date: 04/22/2020  ?This Visit's Progress: On track  ?Recent Progress: On track  ?Priority: High  ?Note:   ?Current Barriers:  ?Unable to independently afford treatment regimen ?Patient APPROVED for [name] and Ozempic medication assistance from [employer] for 2023 calendar year ? ?Pharmacist Clinical Goal(s):  ?Over the next 90 days, patient will achieve control of T2DM as evidenced by A1C <7.0%. through collaboration with PharmD and provider.  ?Over the next 90 days, patient will maintain control of LDL as evidenced by LDL <70 mg/dL through collaboration with PharmD and provider.  ?Over the next 90 days, patient will verbalize ability to afford treatment regimen. through collaboration with PharmD and provider. ? ?Interventions: ?Inter-disciplinary care team collaboration (see longitudinal plan of care) ?1:1 collaboration with Summer Fenton, NP regarding development and update of comprehensive plan of care as evidenced by provider attestation and co-signature ? ?Medication Assistance ?Collaborated with Sharon Simcox for aid to patient with re-applying [place] and Ozempic medication assistance from [employer] for 2023 calendar year ?From review of chart, [facility] CPhT contacted [facility] on 12/28 and was advised patient APPROVED 02/17/21-02/16/22 for program re-enrollment ?Confirms picked up 30 day supplies of both [place] and Ozempic from [employer] through [employer] and then since also received supplies of Ozempic, [place] and  pen needles from assistance program.  ? 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 ?Hyperlipidemia: ?Controlled; current treatment: atorvastatin 40 mg daily  ?Have counseled patient on importance of medication adherence ?Have discussed dietary management ? ? ?Patient Goals/Self-Care Activities ?Over the next 90 days, patient will:  ?Take medications as prescribed ?Check blood glucose daily, document,  and provide at future appointments ?Keeps log in App on phone ?Collaborate with provider on medication access solutions ?Attends all scheduled provider appointments ?Calls pharmacy for medication refills ?Calls provider office for new concerns or questions ? ?Follow Up Plan: Telephone follow up appointment with care management team member scheduled for: 05/20/2021 at 8:30 am ?  ?  ? ?Wallace Cullens, PharmD, BCACP, CPP ?Clinical Pharmacist ?[facility] ?[place] ?[phone] ? ? ? ? ? ?

## 2021-05-02 ENCOUNTER — Telehealth: Payer: PPO

## 2021-05-02 ENCOUNTER — Ambulatory Visit: Payer: Self-pay

## 2021-05-02 NOTE — Patient Instructions (Signed)
Visit Information ? ?Thank you for taking time to visit with me today. Please don't hesitate to contact me if I can be of assistance to you before our next scheduled telephone appointment. ? ?Following are the goals we discussed today:  ?RNCM Clinical Goal(s):  ?Patient will verbalize understanding of plan for management of HTN, HLD, DMII, Osteoporosis, and chronic pain  ?verbalize basic understanding of HTN, HLD, DMII, Osteoporosis, and chronic pain disease process and self health management plan  ?take all medications exactly as prescribed and will call provider for medication related questions ?demonstrate understanding of rationale for each prescribed medication  ?attend all scheduled medical appointments:Patient requesting a new appointment for follow up with the pcp and blood work. Have contacted the administrative staff for assistance  ?demonstrate improved and ongoing adherence to prescribed treatment plan for HTN, HLD, DMII, Osteoporosis, and Chronic pain as evidenced by daily monitoring and recording of CBG  adherence to ADA/ carb modified diet adherence to prescribed medication regimen contacting provider for new or worsened symptoms or questions  ?demonstrate improved and ongoing health management independence  ?work with pharmacist to address DM management and medications needs related to DMII ?demonstrate a decrease in HTN, HLD, DMII, Osteoporosis, and chronic pain exacerbations  ?demonstrate ongoing self health care management ability for effective management of chronic conditions  through collaboration with RN Care manager, provider, and care team.  ?  ?Interventions: ?1:1 collaboration with primary care provider regarding development and update of comprehensive plan of care as evidenced by provider attestation and co-signature ?Inter-disciplinary care team collaboration (see longitudinal plan of care) ?Evaluation of current treatment plan related to  self management and patient's adherence to plan  as established by provider ?  ?  ?SDOH Barriers (Status: Goal on track: YES.)  ?Patient interviewed and SDOH assessment performed ?       ?SDOH Interventions   ?  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  ?   ?Patient interviewed and appropriate assessments performed ?Provided patient with information about resources available for SDOH needs  ?Discussed plans with patient for ongoing care management follow up and provided patient with direct contact information for care management team ?Advised patient to call the office for changes in SDOH, questions or concerns ?  ?  ?  ?Diabetes:  (Status: Goal on track: YES.) ?     ?Lab Results  ?Component Value Date  ?  HGBA1C 7.5 (H) 12/17/2020  ?12-17-2020: Pending new results of new HGBA1C today in the office. 7.5% ?Assessed patient's understanding of A1c goal: <7% ?Provided education to patient about basic DM disease process; ?Reviewed medications with patient and discussed importance of medication adherence. 05-02-2021: The patient is compliant with her medications. Denies any issues with medications compliance        ?Reviewed prescribed diet with patient heart healthy/ADA. 12-17-2020: Supplied the patient with Glucerna samples to try to help with adding nutrients to her diet. 05-02-2021: The patient is eating well and denies any new issues with dietary restrictions  ; ?Counseled on  importance of regular laboratory monitoring as prescribed. 12-17-2020: The patient has pending lab work today. 03-07-2021: Has regular labwork. No acute findings. 05-02-2021: The patient requesting assistance with an appointment for follow up with the pcp for new blood work to check A1C;        ?Discussed plans with patient for ongoing care management follow up and provided patient with direct contact information for care management team;      ?Provided patient with written educational materials related to hypo and hyperglycemia and importance of correct treatment. 03-07-2021: Lowest blood sugar reading has been 87. Highest she has seen is 125. 05-02-2021: States that her blood sugars are stable and doing well. Does need new lab work. Working with office staff to get an appointment.      ?Reviewed scheduled/upcoming provider appointments including: needs a new appointment ?Advised patient, providing education and rationale, to check cbg as directed  and record. 03-07-2021:The patient is compliant with recommendations to check blood sugars. Gave range of blood sugars 87 to 125. Ask about the   freestyle [place]. Education given. The patient states she does not know that she needs this. 05-02-2021: The patient states that her levels are doing well. The patient denies any changes with her DM.    ?call provider for findings outside established parameters. 12-17-2020: States her blood sugars are doing well and states they are in the 100's. 05-02-2021: Knows to call for changes in condition and if her blood sugars are out of range      ?Referral made to pharmacy team for assistance with medications assistance program and effective management of DM. 05-02-2021: Is working with the pharm D for ongoing support and education for medications        ?Review of patient status, including review of consultants reports, relevant laboratory and other test results, and medications completed;       ?Screening for signs and  symptoms of depression related to chronic disease state;        ?Assessed social determinant of health barriers;        ?  ?Osteoporosis  (Status: Goal on track: YES.) ?Evaluation of current treatment plan related to Osteoporosis,  self-management and patient's adherence to plan as established by provider. 05-02-2021: The patient is active and denies any issues. Review of safety precautions. 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  ?Discussed plans with patient for ongoing care management follow up and provided patient with direct contact information for care management team ?Advised patient to call the office for any falls, fractures, or changes in osteoporosis health; ?Provided education to patient re: safety, being careful and prevention of falls, notifying provider of changes in condtions; ?Reviewed medications with patient and discussed compliance. 05-02-2021: The patient is compliant with medications, denies any new concerns; ?Reviewed scheduled/upcoming provider appointments including 05-02-2021: The patient needs a follow up appointment with the pcp. The patient states that she needs a follow up for checking he A1C. ?Discussed plans with patient for ongoing care management follow up and provided patient with direct contact information for care management team; ?  ?Hyperlipidemia:  (Status: Goal on track: YES.) ?     ?Lab Results  ?Component Value Date  ?  CHOL 122 12/17/2020  ?  HDL 54 12/17/2020  ?  LDLCALC 49 12/17/2020  ?  TRIG 100 12/17/2020  ?  CHOLHDL 2.3 12/17/2020  ? New lab work pending from today. Will review results  when they are available ?  ?Medication review performed; medication list updated in electronic medical record. 05-02-2021: Is compliant with medications.  ?Provider established cholesterol goals reviewed. 05-02-2021: Praised for cholesterol levels being at goal; ?Counseled on importance of regular laboratory monitoring as  prescribed. 05-02-2021: Knows to get regular labs done for monitoring  ?Provided HLD educational materials; ?Reviewed role and benefits of statin for ASCVD risk reduction; ?Discussed strategies to manage statin-induced

## 2021-05-02 NOTE — Chronic Care Management (AMB) (Signed)
?Chronic Care Management  ? ?CCM RN Visit Note ? ?05/02/2021 ?Name: Summer Young MRN: [id number] DOB: 1950-09-12 ? ?Subjective: ?Summer Young is a 71 y.o. year old female who is a primary care patient of [name], NP. The care management team was consulted for assistance with disease management and care coordination needs.   ? ?Engaged with patient by telephone for follow up visit in response to provider referral for case management and/or care coordination services.  ? ?Consent to Services:  ?The patient was given information about Chronic Care Management services, agreed to services, and gave verbal consent prior to initiation of services.  Please see initial visit note for detailed documentation.  ? ?Patient agreed to services and verbal consent obtained.  ? ?Assessment: Review of patient past medical history, allergies, medications, health status, including review of consultants reports, laboratory and other test data, was performed as part of comprehensive evaluation and provision of chronic care management services.  ? ?SDOH (Social Determinants of Health) assessments and interventions performed:   ? ?CCM Care Plan ? ?Allergies  ?Allergen Reactions  ? Aspirin Hives  ?  Other reaction(s): Other (See Comments) ?Other Reaction: Not Assessed ?  ? Insulin Detemir Hives and Rash  ?  Errythema, edema, heat at site of injection did not improve after 2 weeks of use.  ?  ? Shellfish Allergy Anaphylaxis  ? Dapagliflozin Other (See Comments)  ?  Yeast infections ?Yeast infection ?[place]  ? Influenza Vaccines Other (See Comments)  ?  Dulce Sellar  ? Other   ?  Other reaction(s): Other (See Comments) ?Uncoded Allergy. Allergen: Shellfish, Other Reaction: Not Assessed    Flu vaccine given 1998 caused anaphylaxis  ? ? ?Outpatient Encounter Medications as of 05/02/2021  ?Medication Sig Note  ? acetaminophen (TYLENOL) 650 MG CR tablet Take 650 mg by mouth 2 (two) times daily as needed for pain.   ? alendronate (FOSAMAX) 70 MG  tablet Take 1 tablet (70 mg total) by mouth once a week. Take with a full glass of water on an empty stomach.   ? Ascorbic Acid (VITAMIN C) 1000 MG tablet Take 1,000 mg by mouth daily as needed.  09/20/2018: As needed to prevent illness  ? atorvastatin (LIPITOR) 40 MG tablet Take 1 tablet by mouth once daily   ? Biotin 1000 MCG tablet Take 4,000 mcg by mouth every other day.  01/19/2018: Takes every other day  ? blood glucose meter kit and supplies Dispense based on patient and insurance preference. Use up to four times daily as directed. (FOR ICD-10 E10.9, E11.9).   ? Cholecalciferol (VITAMIN D3) 50 MCG (2000 UT) capsule Take 1,000 Units by mouth daily.    ? insulin degludec (TRESIBA FLEXTOUCH) 100 UNIT/ML FlexTouch Pen Inject 26 Units into the skin daily. (Patient taking differently: Inject 20 Units into the skin daily.)   ? metFORMIN (GLUCOPHAGE-XR) 500 MG 24 hr tablet TAKE 3 TABLETS BY MOUTH ONCE DAILY WITH BREAKFAST   ? omeprazole (PRILOSEC) 40 MG capsule Take 1 capsule (40 mg total) by mouth daily before breakfast.   ? ONETOUCH ULTRA test strip Check blood sugar 1 x daily as directed   ? polyethylene glycol (MIRALAX / GLYCOLAX) 17 g packet Take 17 g by mouth daily as needed.   ? Semaglutide, 1 MG/DOSE, (OZEMPIC, 1 MG/DOSE,) 2 MG/1.5ML SOPN INJECT 1 MG SUBCUTANEOUSLY ONCE A WEEK   ? spironolactone (ALDACTONE) 25 MG tablet Take 1 tablet by mouth once daily   ? sucralfate (CARAFATE) 1 g tablet  Take 1 tablet (1 g total) by mouth 4 (four) times daily -  with meals and at bedtime. As needed only for abdominal pain (Patient not taking: Reported on 03/18/2021)   ? vitamin B-12 (CYANOCOBALAMIN) 1000 MCG tablet Take 1,000 mcg by mouth daily.    ? ?No facility-administered encounter medications on file as of 05/02/2021.  ? ? ?Patient Active Problem List  ? Diagnosis Date Noted  ? Mixed incontinence 12/17/2020  ? Overweight with body mass index (BMI) of 29 to 29.9 in adult 08/28/2020  ? Osteoporosis 08/16/2019  ? Chronic  bilateral low back pain without sciatica 11/22/2014  ? Chronic pain of both knees 11/22/2014  ? Essential hypertension 11/22/2014  ? Dyslipidemia associated with type 2 diabetes mellitus ([place]) 11/22/2014  ? Type 2 diabetes mellitus with hyperglycemia ([place]) 01/19/2013  ? ? ?Conditions to be addressed/monitored:HTN, HLD, DMII, GERD, Osteoporosis, and Chronic pain ? ?Care Plan : RNCM: General Plan of Care (Adult) for Chronic Disease Management and Care Coordination Needs  ?Updates made by [name], RN since 05/02/2021 12:00 AM  ?  ? ?Problem: RNCM: Development for plan of care for Chronic Disease Management (HTN, HLD, DM, Chronic pain, osteoporosis)   ?Priority: High  ?  ? ?Long-Range Goal: RNCM: Development for plan of care for Chronic Disease Management (HTN, HLD, DM, Chronic pain, osteoporosis)   ?Start Date: 12/06/2020  ?Expected End Date: 12/06/2021  ?Priority: High  ?Note:   ?Current Barriers:  ?Knowledge Deficits related to plan of care for management of HTN, HLD, DMII, and Chronic pain and osteoporosis   ?Chronic Disease Management support and education needs related to HTN, HLD, DMII, and Chronic pain and osteoporosis  ? ?RNCM Clinical Goal(s):  ?Patient will verbalize understanding of plan for management of HTN, HLD, DMII, Osteoporosis, and chronic pain  ?verbalize basic understanding of HTN, HLD, DMII, Osteoporosis, and chronic pain disease process and self health management plan  ?take all medications exactly as prescribed and will call provider for medication related questions ?demonstrate understanding of rationale for each prescribed medication  ?attend all scheduled medical appointments:Patient requesting a new appointment for follow up with the pcp and blood work. Have contacted the administrative staff for assistance  ?demonstrate improved and ongoing adherence to prescribed treatment plan for HTN, HLD, DMII, Osteoporosis, and Chronic pain as evidenced by daily monitoring and recording of CBG   adherence to ADA/ carb modified diet adherence to prescribed medication regimen contacting provider for new or worsened symptoms or questions  ?demonstrate improved and ongoing health management independence  ?work with pharmacist to address DM management and medications needs related to DMII ?demonstrate a decrease in HTN, HLD, DMII, Osteoporosis, and chronic pain exacerbations  ?demonstrate ongoing self health care management ability for effective management of chronic conditions  through collaboration with RN Care manager, provider, and care team.  ? ?Interventions: ?1:1 collaboration with primary care provider regarding development and update of comprehensive plan of care as evidenced by provider attestation and co-signature ?Inter-disciplinary care team collaboration (see longitudinal plan of care) ?Evaluation of current treatment plan related to  self management and patient's adherence to plan as established by provider ? ? ?SDOH Barriers (Status: Goal on track: YES.)  ?Patient interviewed and SDOH assessment performed ?       ?SDOH Interventions   ? 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 ?  ?Patient interviewed and appropriate assessments performed ?Provided patient with information about resources available for SDOH needs  ?Discussed plans with patient for ongoing care management follow up and provided  patient with direct contact information for care management team ?Advised patient to call the office for changes in SDOH, questions or concerns ? ? ? ?Diabetes:  (Status: Goal on track: YES.) ?Lab Results  ?[place]

## 2021-05-05 ENCOUNTER — Other Ambulatory Visit: Payer: Self-pay | Admitting: Internal Medicine

## 2021-05-05 DIAGNOSIS — I10 Essential (primary) hypertension: Secondary | ICD-10-CM

## 2021-05-07 ENCOUNTER — Ambulatory Visit: Payer: PPO | Admitting: Internal Medicine

## 2021-05-07 NOTE — Telephone Encounter (Signed)
Requested Prescriptions  ?Pending Prescriptions Disp Refills  ?? spironolactone (ALDACTONE) 25 MG tablet [Pharmacy Med Name: Spironolactone 25 MG Oral Tablet] 90 tablet 0  ?  Sig: Take 1 tablet by mouth once daily  ?  ? Cardiovascular: Diuretics - Aldosterone Antagonist Passed - 05/05/2021  7:07 PM  ?  ?  Passed - Cr in normal range and within 180 days  ?  Creat  ?Date Value Ref Range Status  ?12/17/2020 0.78 0.50 - 1.05 mg/dL Final  ? ?Creatinine, Urine  ?Date Value Ref Range Status  ?12/17/2020 51 20 - 275 mg/dL Final  ?   ?  ?  Passed - K in normal range and within 180 days  ?  Potassium  ?Date Value Ref Range Status  ?12/17/2020 4.8 3.5 - 5.3 mmol/L Final  ?   ?  ?  Passed - Na in normal range and within 180 days  ?  Sodium  ?Date Value Ref Range Status  ?12/17/2020 140 135 - 146 mmol/L Final  ?   ?  ?  Passed - eGFR is 30 or above and within 180 days  ?  GFR, Est African American  ?Date Value Ref Range Status  ?06/10/2018 91 > OR = 60 mL/min/1.48m2 Final  ? ?GFR, Est Non African American  ?Date Value Ref Range Status  ?06/10/2018 79 > OR = 60 mL/min/1.76m2 Final  ? ?eGFR  ?Date Value Ref Range Status  ?12/17/2020 83 > OR = 60 mL/min/1.88m2 Final  ?  Comment:  ?  The eGFR is based on the CKD-EPI 2021 equation. To calculate  ?the new eGFR from a previous Creatinine or Cystatin C ?result, go to https://www.kidney.org/professionals/ ?kdoqi/gfr%5Fcalculator ?  ?   ?  ?  Passed - Last BP in normal range  ?  BP Readings from Last 1 Encounters:  ?03/15/21 113/80  ?   ?  ?  Passed - Valid encounter within last 6 months  ?  Recent Outpatient Visits   ?      ? 1 month ago Gastroesophageal reflux disease without esophagitis  ? [place], DO  ? 4 months ago Encounter for general adult medical examination with abnormal findings  ? [facility] [place], [place], NP  ? 8 months ago Bilateral hip pain  ? [facility] [place], [place], NP  ? 11 months ago  Postmenopausal bleeding  ? [place], DO  ? 1 year ago Type 2 diabetes mellitus with hyperglycemia, without long-term current use of insulin ([place])  ? [place], [place]  ?  ?  ?Future Appointments   ?        ? In 2 weeks Baity, Coralie Keens, NP [facility], [place]  ?  ? ?  ?  ?  ? ?

## 2021-05-14 ENCOUNTER — Ambulatory Visit: Payer: PPO

## 2021-05-20 ENCOUNTER — Ambulatory Visit (INDEPENDENT_AMBULATORY_CARE_PROVIDER_SITE_OTHER): Payer: PPO | Admitting: Pharmacist

## 2021-05-20 DIAGNOSIS — Z794 Long term (current) use of insulin: Secondary | ICD-10-CM

## 2021-05-20 DIAGNOSIS — E782 Mixed hyperlipidemia: Secondary | ICD-10-CM

## 2021-05-20 NOTE — Chronic Care Management (AMB) (Signed)
? ?Chronic Care Management ?CCM Pharmacy Note ? ?05/20/2021 ?Name:  Summer Young MRN:  [id number] DOB:  [birth year] ? ? ?Subjective: ?Summer Young is an 71 y.o. year old female who is a primary patient of [name], NP.  The CCM team was consulted for assistance with disease management and care coordination needs.   ? ?Engaged with patient by telephone for follow up visit for pharmacy case management and/or care coordination services.  ? ?Objective: ? ?Medications Reviewed Today   ? ? Reviewed by Rennis Petty, RPH-CPP (Pharmacist) on 05/20/21 at [phone]  Med List Status: <None>  ? ?Medication Order Taking? Sig Documenting Provider Last Dose Status Informant  ?acetaminophen (TYLENOL) 650 MG CR tablet [id number]  Take 650 mg by mouth 2 (two) times daily as needed for pain. [provider]  Active   ?alendronate (FOSAMAX) 70 MG tablet [id number]  Take 1 tablet (70 mg total) by mouth once a week. Take with a full glass of water on an empty stomach. Olin Hauser, DO  Active   ?Ascorbic Acid (VITAMIN C) 1000 MG tablet [id number]  Take 1,000 mg by mouth daily as needed.  [provider]  Active   ?         ?Med Note [facility], Donney Caraveo A   Mon Sep 20, 2018  4:05 PM) As needed to prevent illness  ?atorvastatin (LIPITOR) 40 MG tablet [id number]  Take 1 tablet by mouth once daily [name], NP  Active   ?Biotin 1000 MCG tablet [id number]  Take 4,000 mcg by mouth every other day.  [provider]  Active Self  ?         ?Med Note [facility] Ellison   Tue Jan 19, 2018 11:31 AM) [name] every other day  ?blood glucose meter kit and supplies [id number]  Dispense based on patient and insurance preference. Use up to four times daily as directed. (FOR ICD-10 E10.9, E11.9). Malfi, Lupita Raider, FNP  Active   ?Cholecalciferol (VITAMIN D3) 50 MCG (2000 UT) capsule [id number]  Take 1,000 Units by mouth daily.  [provider]  Active   ?insulin degludec (TRESIBA FLEXTOUCH) 100 UNIT/ML FlexTouch  Pen [id number] Yes Inject 26 Units into the skin daily.  ?Patient taking differently: Inject 20 Units into the skin daily.  ? [name], NP Taking Active   ?metFORMIN (GLUCOPHAGE-XR) 500 MG 24 hr tablet [id number] Yes TAKE 3 TABLETS BY MOUTH ONCE DAILY WITH BREAKFAST [name], NP Taking Active   ?omeprazole (PRILOSEC) 40 MG capsule [id number]  Take 1 capsule (40 mg total) by mouth daily before breakfast. Olin Hauser, DO  Active   ?ONETOUCH ULTRA test strip [id number]  Check blood sugar 1 x daily as directed Olin Hauser, DO  Active   ?polyethylene glycol (MIRALAX / GLYCOLAX) 17 g packet [id number]  Take 17 g by mouth daily as needed. [provider]  Active   ?Semaglutide, 1 MG/DOSE, (OZEMPIC, 1 MG/DOSE,) 2 MG/1.5ML SOPN [id number] Yes INJECT 1 MG SUBCUTANEOUSLY ONCE A WEEK Baity, Coralie Keens, NP Taking Active   ?spironolactone (ALDACTONE) 25 MG tablet [id number]  Take 1 tablet by mouth once daily [name], NP  Active   ?sucralfate (CARAFATE) 1 g tablet [id number]  Take 1 tablet (1 g total) by mouth 4 (four) times daily -  with meals and at bedtime. As needed only for abdominal pain  ?Patient not taking: Reported on 03/18/2021  ? Olin Hauser, DO  Active   ?  vitamin B-12 (CYANOCOBALAMIN) 1000 MCG tablet [id number]  Take 1,000 mcg by mouth daily.  [provider]  Active   ? ?  ?  ? ?  ? ? ?Pertinent Labs:  ?Lab Results  ?Component Value Date  ? HGBA1C 7.5 (H) 12/17/2020  ? ?Lab Results  ?Component Value Date  ? CHOL 122 12/17/2020  ? HDL 54 12/17/2020  ? LDLCALC 49 12/17/2020  ? TRIG 100 12/17/2020  ? CHOLHDL 2.3 12/17/2020  ? ?Lab Results  ?Component Value Date  ? CREATININE 0.78 12/17/2020  ? BUN 14 12/17/2020  ? NA 140 12/17/2020  ? K 4.8 12/17/2020  ? CL 106 12/17/2020  ? CO2 25 12/17/2020  ? ? ?SDOH:  (Social Determinants of Health) assessments and interventions performed:  ? ? ?CCM Care Plan ? ?Review of patient past medical history, allergies,  medications, health status, including review of consultants reports, laboratory and other test data, was performed as part of comprehensive evaluation and provision of chronic care management services.  ? ?Care Plan : General Pharmacy (Adult)  ?Updates made by Rennis Petty, RPH-CPP since 05/20/2021 12:00 AM  ?  ? ?Problem: Disease Progression   ?  ? ?Long-Range Goal: Disease Progression Prevented or Minimized   ?Start Date: 01/23/2020  ?Expected End Date: 04/22/2020  ?This Visit's Progress: On track  ?Recent Progress: On track  ?Priority: High  ?Note:   ?Current Barriers:  ?Unable to independently afford treatment regimen ?Patient APPROVED for [name] and Ozempic medication assistance from [employer] for 2023 calendar year ? ?Pharmacist Clinical Goal(s):  ?Over the next 90 days, patient will achieve control of T2DM as evidenced by A1C <7.0%. through collaboration with PharmD and provider.  ?Over the next 90 days, patient will maintain control of LDL as evidenced by LDL <70 mg/dL through collaboration with PharmD and provider.  ?Over the next 90 days, patient will verbalize ability to afford treatment regimen. through collaboration with PharmD and provider. ? ?Interventions: ?Inter-disciplinary care team collaboration (see longitudinal plan of care) ?1:1 collaboration with [name], NP regarding development and update of comprehensive plan of care as evidenced by provider attestation and co-signature ?Confirms scheduled her follow up appointment with PCP; scheduled for 4/12 ?Today reports she is currently in [place] as her nephew has recently passed away ? 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 ?Hyperlipidemia: ?Controlled; current treatment: atorvastatin 40 mg daily  ?Have counseled patient on importance of medication adherence ?Have discussed dietary management ? ? ?Patient Goals/Self-Care Activities ?Over the next 90 days, patient will:  ?Take medications as prescribed ?Check blood glucose daily, document, and provide at future appointments ?Keeps log in App on phone ?Collaborate with provider on medication access solutions ?Attends all scheduled provider appointments ?Calls pharmacy for medication refills ?Calls provider office for new concerns or questions ? ?Follow Up Plan: Telephone follow up appointment with care management team member scheduled for: 07/24/2021 at 8:30 AM ?  ?  ? ?Wallace Cullens, PharmD, BCACP, CPP ?Clinical Pharmacist ?[facility] ?[place] ?[phone] ? ? ? ? ? ?

## 2021-05-20 NOTE — Patient Instructions (Signed)
Visit Information ? ?Thank you for taking time to visit with me today. Please don't hesitate to contact me if I can be of assistance to you before our next scheduled telephone appointment. ? ?Following are the goals we discussed today:  ? Goals Addressed   ? ?  ?  ?  ?  ? This Visit's Progress  ?  Pharmacy Goals     ?  Our goal A1c is less than 7%. This corresponds with fasting sugars less than 130 and 2 hour after meal sugars less than 180. Please check your blood sugars and keep log of results ? ?Our goal bad cholesterol, or LDL, is less than 70 . This is why it is important to continue taking your atorvastatin ? ?Please check your home blood pressure, keep a log of the results and bring this with you to your medical appointments. ? ?Feel free to call me with any questions or concerns. I look forward to our next call! ? ?Wallace Cullens, PharmD, BCACP ?Clinical Pharmacist ?[facility] ?[place] ?[phone] ? ?  ? ?  ? ? ? ?Our next appointment is by telephone on 07/24/2021 at 8:30 AM ? ?Please call the care guide team at [phone] if you need to cancel or reschedule your appointment.  ? ? ?Patient verbalizes understanding of instructions and care plan provided today and agrees to view in [place]. Active MyChart status confirmed with patient.   ? ?

## 2021-05-21 ENCOUNTER — Telehealth: Payer: PPO

## 2021-05-21 ENCOUNTER — Ambulatory Visit: Payer: PPO | Admitting: Internal Medicine

## 2021-05-22 ENCOUNTER — Ambulatory Visit: Payer: PPO | Admitting: Internal Medicine

## 2021-05-29 ENCOUNTER — Encounter: Payer: Self-pay | Admitting: Internal Medicine

## 2021-05-29 ENCOUNTER — Ambulatory Visit (INDEPENDENT_AMBULATORY_CARE_PROVIDER_SITE_OTHER): Payer: PPO | Admitting: Internal Medicine

## 2021-05-29 ENCOUNTER — Ambulatory Visit
Admission: RE | Admit: 2021-05-29 | Discharge: 2021-05-29 | Disposition: A | Discharge status: Home / Self Care | Payer: PPO | Source: Ambulatory Visit | Attending: Internal Medicine | Admitting: Internal Medicine

## 2021-05-29 ENCOUNTER — Ambulatory Visit
Admission: RE | Admit: 2021-05-29 | Discharge: 2021-05-29 | Disposition: A | Discharge status: Home / Self Care | Payer: PPO | Attending: Internal Medicine | Admitting: Internal Medicine

## 2021-05-29 VITALS — BP 122/66 | HR 79 | Temp 96.6°F | Wt 162.0 lb

## 2021-05-29 DIAGNOSIS — E1165 Type 2 diabetes mellitus with hyperglycemia: Secondary | ICD-10-CM

## 2021-05-29 DIAGNOSIS — M545 Low back pain, unspecified: Secondary | ICD-10-CM

## 2021-05-29 DIAGNOSIS — E663 Overweight: Secondary | ICD-10-CM

## 2021-05-29 DIAGNOSIS — I10 Essential (primary) hypertension: Secondary | ICD-10-CM | POA: Diagnosis not present

## 2021-05-29 DIAGNOSIS — K219 Gastro-esophageal reflux disease without esophagitis: Secondary | ICD-10-CM

## 2021-05-29 DIAGNOSIS — G8929 Other chronic pain: Secondary | ICD-10-CM | POA: Insufficient documentation

## 2021-05-29 DIAGNOSIS — M25561 Pain in right knee: Secondary | ICD-10-CM

## 2021-05-29 DIAGNOSIS — E1169 Type 2 diabetes mellitus with other specified complication: Secondary | ICD-10-CM

## 2021-05-29 DIAGNOSIS — N3946 Mixed incontinence: Secondary | ICD-10-CM

## 2021-05-29 DIAGNOSIS — E785 Hyperlipidemia, unspecified: Secondary | ICD-10-CM

## 2021-05-29 DIAGNOSIS — M25562 Pain in left knee: Secondary | ICD-10-CM

## 2021-05-29 DIAGNOSIS — Z6829 Body mass index (BMI) 29.0-29.9, adult: Secondary | ICD-10-CM

## 2021-05-29 DIAGNOSIS — M79675 Pain in left toe(s): Secondary | ICD-10-CM | POA: Diagnosis not present

## 2021-05-29 DIAGNOSIS — M810 Age-related osteoporosis without current pathological fracture: Secondary | ICD-10-CM

## 2021-05-29 NOTE — Progress Notes (Signed)
? ?Subjective:  ? ? Patient ID: Summer Young, female    DOB: [birth year], 71 y.o.   MRN: [id number] ? ?HPI ? ?Patient presents to clinic today for 5-month follow-up of chronic conditions. ? ?HTN: Her BP today is 122/66.  She is taking Spironolactone as prescribed.  ECG from 08/2018 reviewed. ? ?HLD: Her last LDL was 49, triglycerides 100, 11/2020.  She denies myalgias on Atorvastatin.  She tries to consume a low-fat diet. ? ?DM2: Her last A1c was 7.5%, 11/2018.  She is taking Metformin, Ozempic and [place] as prescribed.  Her sugars range 92-148.  She checks her feet routinely.  Her last eye exam was 09/2020.  Flu never.  Pneumovax 09/2020.  Prevnar 11/2017.  [place] x3. ? ?Osteoporosis: She is taking Fosamax as prescribed.  She tries to get weightbearing exercise and daily.  Bone density from 07/2019 reviewed. ? ?OA: Mainly in her back, hips and knees. She does feel like it is getting worse. The pain does not radiate down her legs. She denies numbness, tingling or weakness of her lower extremities. She is taking Tylenol and using Voltaren gel as prescribed. ? ?GERD: She denies breakthrough on Omeprazole. There is no upper GI on file. ? ?She also reports pain in her left great toe. She noticed this a few months ago. She describes the pain as soreness when you press on the nail. ? ?Review of Systems ? ?   ?Past Medical History:  ?Diagnosis Date  ? Allergy   ? Chronic pain of both knees   ? Diabetes mellitus without complication ([place])   ? Hyperlipidemia   ? Hypertension   ? ? ?Current Outpatient Medications  ?Medication Sig Dispense Refill  ? acetaminophen (TYLENOL) 650 MG CR tablet Take 650 mg by mouth 2 (two) times daily as needed for pain.    ? alendronate (FOSAMAX) 70 MG tablet Take 1 tablet (70 mg total) by mouth once a week. Take with a full glass of water on an empty stomach. 12 tablet 1  ? Ascorbic Acid (VITAMIN C) 1000 MG tablet Take 1,000 mg by mouth daily as needed.     ? atorvastatin (LIPITOR) 40 MG tablet  Take 1 tablet by mouth once daily 90 tablet 0  ? Biotin 1000 MCG tablet Take 4,000 mcg by mouth every other day.     ? blood glucose meter kit and supplies Dispense based on patient and insurance preference. Use up to four times daily as directed. (FOR ICD-10 E10.9, E11.9). 1 each 0  ? Cholecalciferol (VITAMIN D3) 50 MCG (2000 UT) capsule Take 1,000 Units by mouth daily.     ? insulin degludec (TRESIBA FLEXTOUCH) 100 UNIT/ML FlexTouch Pen Inject 26 Units into the skin daily. (Patient taking differently: Inject 20 Units into the skin daily.) 15 mL 0  ? metFORMIN (GLUCOPHAGE-XR) 500 MG 24 hr tablet TAKE 3 TABLETS BY MOUTH ONCE DAILY WITH BREAKFAST 270 tablet 0  ? omeprazole (PRILOSEC) 40 MG capsule Take 1 capsule (40 mg total) by mouth daily before breakfast. 30 capsule 2  ? ONETOUCH ULTRA test strip Check blood sugar 1 x daily as directed 100 each 12  ? polyethylene glycol (MIRALAX / GLYCOLAX) 17 g packet Take 17 g by mouth daily as needed.    ? Semaglutide, 1 MG/DOSE, (OZEMPIC, 1 MG/DOSE,) 2 MG/1.5ML SOPN INJECT 1 MG SUBCUTANEOUSLY ONCE A WEEK 4 mL 0  ? spironolactone (ALDACTONE) 25 MG tablet Take 1 tablet by mouth once daily 90 tablet 0  ? sucralfate (  CARAFATE) 1 g tablet Take 1 tablet (1 g total) by mouth 4 (four) times daily -  with meals and at bedtime. As needed only for abdominal pain (Patient not taking: Reported on 03/18/2021) 30 tablet 0  ? vitamin B-12 (CYANOCOBALAMIN) 1000 MCG tablet Take 1,000 mcg by mouth daily.     ? ?No current facility-administered medications for this visit.  ? ? ?Allergies  ?Allergen Reactions  ? Aspirin Hives  ?  Other reaction(s): Other (See Comments) ?Other Reaction: Not Assessed ?  ? Insulin Detemir Hives and Rash  ?  Errythema, edema, heat at site of injection did not improve after 2 weeks of use.  ?  ? Shellfish Allergy Anaphylaxis  ? Dapagliflozin Other (See Comments)  ?  Yeast infections ?Yeast infection ?[place]  ? Influenza Vaccines Other (See Comments)  ?  Dulce Sellar  ?  Other   ?  Other reaction(s): Other (See Comments) ?Uncoded Allergy. Allergen: Shellfish, Other Reaction: Not Assessed    Flu vaccine given 1998 caused anaphylaxis  ? ? ?Family History  ?Problem Relation Age of Onset  ? Diabetes Mother   ? Diabetes Father   ? Stroke Father   ? Diabetes Sister   ? Heart attack Other   ? Diabetes Sister   ? Diabetes Sister   ? Diabetes Sister   ? ? ?Social History  ? ?Socioeconomic History  ? Marital status: Married  ?  Spouse name: Not on file  ? Number of children: Not on file  ? Years of education: Not on file  ? Highest education level: High school graduate  ?Occupational History  ? 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 Smoking status: Never  ? Smokeless tobacco: Never  ?Vaping Use  ? Vaping Use: Never used  ?Substance and Sexual Activity  ? Alcohol use: Yes  ?  Alcohol/week: 0.0 - 1.0 standard drinks  ?  Comment: occasional  ? Drug use: Never  ? Sexual activity: Yes  ?  Birth control/protection: None  ?Other Topics Concern  ? Not on file  ?Social History Narrative  ? Not on file  ? ?Social Determinants of Health  ? ?Financial Resource Strain: Low Risk   ? Difficulty of Paying Living Expenses: Not hard at all  ?Food Insecurity: No Food Insecurity  ? Worried About Charity fundraiser in the Last Year: Never true  ? Ran Out of Food in the Last Year: Never true  ?Transportation Needs: No Transportation Needs  ? Lack of Transportation (Medical): No  ? Lack of Transportation (Non-Medical): No  ?Physical Activity: Inactive  ? Days of Exercise per Week: 0 days  ? Minutes of Exercise per Session: 0 min  ?Stress: No Stress Concern Present  ? Feeling of Stress : Not at all  ?Social Connections: Moderately Isolated  ? Frequency of Communication with Friends and Family: More than three times a week  ? Frequency of Social Gatherings with Friends and Family: More than three times a week  ? Attends Religious Services: Never  ? Active Member of Clubs or  Organizations: No  ? Attends Archivist Meetings: Never  ? Marital Status: Married  ?Intimate Partner Violence: Not At Risk  ? Fear of Current or Ex-Partner: No  ? Emotionally Abused: No  ? Physically Abused: No  ? Sexually Abused: No  ? ? ? ?Constitutional: Denies fever, malaise, fatigue, headache or abrupt weight changes.  ?HEENT: Denies eye pain, eye redness, ear pain, ringing in the  ears, wax buildup, runny nose, nasal congestion, bloody nose, or sore throat. ?Respiratory: Denies difficulty breathing, shortness of breath, cough or sputum production.   ?Cardiovascular: Denies chest pain, chest tightness, palpitations or swelling in the hands or feet.  ?Gastrointestinal: Denies abdominal pain, bloating, constipation, diarrhea or blood in the stool.  ?GU: Patient reports mixed incontinence.  Denies urgency, frequency, pain with urination, burning sensation, blood in urine, odor or discharge. ?Musculoskeletal: Patient reports chronic back pain, pain in left great toe.  Denies decrease in range of motion, difficulty with gait, muscle pain or joint swelling.  ?Skin: Denies redness, rashes, lesions or ulcercations.  ?Neurological: Denies dizziness, difficulty with memory, difficulty with speech or problems with balance and coordination.  ?Psych: Denies anxiety, depression, SI/HI. ? ?No other specific complaints in a complete review of systems (except as listed in HPI above). ? ?Objective:  ? Physical Exam ? ?BP 122/66 (BP Location: Right Arm, Patient Position: Sitting, Cuff Size: Large)   Pulse 79   Temp (!) 96.6 ?F (35.9 ?C) (Temporal)   Wt 162 lb (73.5 kg)   SpO2 100%   BMI 29.63 kg/m?  ? ?Wt Readings from Last 3 Encounters:  ?03/15/21 159 lb 12.8 oz (72.5 kg)  ?12/17/20 161 lb 12.8 oz (73.4 kg)  ?08/27/20 162 lb 6.4 oz (73.7 kg)  ? ? ?General: Appears her stated age, overweight, in NAD. ?Skin: Warm, dry and intact. No ulcerations noted. ?HEENT: Head: normal shape and size; Eyes: sclera white, no  icterus, conjunctiva pink, PERRLA and EOMs intact;  ?Cardiovascular: Normal rate and rhythm. S1,S2 noted.  No murmur, rubs or gallops noted. No JVD or BLE edema. No carotid bruits noted. ?Pulmonary/Chest: Normal effort and posi

## 2021-05-29 NOTE — Assessment & Plan Note (Signed)
Controlled on Spironolactone ?Reinforced DASH diet and exercise for weight loss ?C-Met today ?

## 2021-05-29 NOTE — Assessment & Plan Note (Signed)
Deteriorated ?X-ray lumbar spine today ?Encourage weight loss as this can help reduce back pain ?Continue Tylenol and Voltaren gel ?

## 2021-05-29 NOTE — Assessment & Plan Note (Addendum)
Encourage weight loss as this can help reduce joint pain ?Continue Tylenol and Voltaren gel ?

## 2021-05-29 NOTE — Assessment & Plan Note (Signed)
No complaints at this time off medication ?We will monitor ?

## 2021-05-29 NOTE — Patient Instructions (Signed)

## 2021-05-29 NOTE — Assessment & Plan Note (Signed)
Encourage diet and exercise for weight loss 

## 2021-05-29 NOTE — Assessment & Plan Note (Signed)
Encourage daily weightbearing exercise ?Continue Fosamax ?

## 2021-05-29 NOTE — Assessment & Plan Note (Signed)
C-Met and lipid profile today ?Encouraged her to consume a low-fat diet ?Continue Atorvastatin ?

## 2021-05-29 NOTE — Assessment & Plan Note (Signed)
A1c today ?Urine microalbumin check 11/2020 ?Continue Metformin, Ozempic and Tresiba ?We will request a copy of her eye exam ?Encourage routine foot exams ?She declines flu shot ?Pneumovax and Prevnar UTD ?Encouraged her to get a COVID booster ?

## 2021-05-29 NOTE — Assessment & Plan Note (Signed)
Avoid foods that trigger reflux ?Encourage weight loss as this can help reduce reflux symptoms ?Continue Omeprazole ?

## 2021-05-30 LAB — LIPID PANEL
Cholesterol: 133 mg/dL (ref <200)
HDL: 50 mg/dL (ref >=50)
LDL Cholesterol (Calc): 62 mg/dL (calc)
Non-HDL Cholesterol (Calc): 83 mg/dL (calc) (ref <130)
Total CHOL/HDL Ratio: 2.7 (calc) (ref <5.0)
Triglycerides: 130 mg/dL (ref <150)

## 2021-05-30 LAB — COMPLETE METABOLIC PANEL WITH GFR
AG Ratio: 1.6 (calc) (ref 1.0–2.5)
ALT: 21 U/L (ref 6–29)
AST: 17 U/L (ref 10–35)
Albumin: 4.1 g/dL (ref 3.6–5.1)
Alkaline phosphatase (APISO): 102 U/L (ref 37–153)
BUN: 13 mg/dL (ref 7–25)
CO2: 27 mmol/L (ref 20–32)
Calcium: 9.3 mg/dL (ref 8.6–10.4)
Chloride: 107 mmol/L (ref 98–110)
Creat: 0.66 mg/dL (ref 0.50–1.05)
Globulin: 2.6 g/dL (calc) (ref 1.9–3.7)
Glucose, Bld: 112 mg/dL — ABNORMAL HIGH (ref 65–99)
Potassium: 5 mmol/L (ref 3.5–5.3)
Sodium: 141 mmol/L (ref 135–146)
Total Bilirubin: 0.4 mg/dL (ref 0.2–1.2)
Total Protein: 6.7 g/dL (ref 6.1–8.1)
eGFR: 95 mL/min/{1.73_m2} (ref >=60)

## 2021-05-30 LAB — CBC
HCT: 42.2 % (ref 35.0–45.0)
Hemoglobin: 13.3 g/dL (ref 11.7–15.5)
MCH: 27.4 pg (ref 27.0–33.0)
MCHC: 31.5 g/dL — ABNORMAL LOW (ref 32.0–36.0)
MCV: 86.8 fL (ref 80.0–100.0)
MPV: 9.7 fL (ref 7.5–12.5)
Platelets: 323 10*3/uL (ref 140–400)
RBC: 4.86 10*6/uL (ref 3.80–5.10)
RDW: 13.2 % (ref 11.0–15.0)
WBC: 7.3 10*3/uL (ref 3.8–10.8)

## 2021-05-30 LAB — HEMOGLOBIN A1C
Hgb A1c MFr Bld: 7.8 % of total Hgb — ABNORMAL HIGH (ref <5.7)
Mean Plasma Glucose: 177 mg/dL
eAG (mmol/L): 9.8 mmol/L

## 2021-06-04 ENCOUNTER — Ambulatory Visit: Payer: PPO | Admitting: Licensed Clinical Social Worker

## 2021-06-04 DIAGNOSIS — G8929 Other chronic pain: Secondary | ICD-10-CM

## 2021-06-04 DIAGNOSIS — M810 Age-related osteoporosis without current pathological fracture: Secondary | ICD-10-CM

## 2021-06-04 DIAGNOSIS — E1165 Type 2 diabetes mellitus with hyperglycemia: Secondary | ICD-10-CM

## 2021-06-04 DIAGNOSIS — F4321 Adjustment disorder with depressed mood: Secondary | ICD-10-CM

## 2021-06-04 NOTE — Chronic Care Management (AMB) (Signed)
?Chronic Care Management  ? ? Clinical Social Work Note ? ?06/04/2021 ?Name: Jaidon Sponsel MRN: [id number] DOB: 04-Jun-1950 ? ?Anastazja Isaac is a 71 y.o. year old female who is a primary care patient of [name], NP. The CCM team was consulted to assist the patient with chronic disease management and/or care coordination needs related to: Grief Counseling.  ? ?Engaged with patient by telephone for follow up visit in response to provider referral for social work chronic care management and care coordination services.  ? ?Consent to Services:  ?The patient was given information about Chronic Care Management services, agreed to services, and gave verbal consent prior to initiation of services.  Please see initial visit note for detailed documentation.  ? ?Patient agreed to services and consent obtained.  ? ?Assessment: Review of patient past medical history, allergies, medications, and health status, including review of relevant consultants reports was performed today as part of a comprehensive evaluation and provision of chronic care management and care coordination services.    ? ?SDOH (Social Determinants of Health) assessments and interventions performed:   ? ?Advanced Directives Status: Not addressed in this encounter. ? ?CCM Care Plan ? ?Allergies  ?Allergen Reactions  ? Aspirin Hives  ?  Other reaction(s): Other (See Comments) ?Other Reaction: Not Assessed ?  ? Insulin Detemir Hives and Rash  ?  Errythema, edema, heat at site of injection did not improve after 2 weeks of use.  ?  ? Shellfish Allergy Anaphylaxis  ? Dapagliflozin Other (See Comments)  ?  Yeast infections ?Yeast infection ?[place]  ? Influenza Vaccines Other (See Comments)  ?  Dulce Sellar  ? Other   ?  Other reaction(s): Other (See Comments) ?Uncoded Allergy. Allergen: Shellfish, Other Reaction: Not Assessed    Flu vaccine given 1998 caused anaphylaxis  ? ? ?Outpatient Encounter Medications as of 06/04/2021  ?Medication Sig Note  ? acetaminophen  (TYLENOL) 650 MG CR tablet Take 650 mg by mouth 2 (two) times daily as needed for pain.   ? alendronate (FOSAMAX) 70 MG tablet Take 1 tablet (70 mg total) by mouth once a week. Take with a full glass of water on an empty stomach.   ? Ascorbic Acid (VITAMIN C) 1000 MG tablet Take 1,000 mg by mouth daily as needed.  09/20/2018: As needed to prevent illness  ? atorvastatin (LIPITOR) 40 MG tablet Take 1 tablet by mouth once daily   ? Biotin 1000 MCG tablet Take 4,000 mcg by mouth every other day.  01/19/2018: Takes every other day  ? blood glucose meter kit and supplies Dispense based on patient and insurance preference. Use up to four times daily as directed. (FOR ICD-10 E10.9, E11.9).   ? Cholecalciferol (VITAMIN D3) 50 MCG (2000 UT) capsule Take 1,000 Units by mouth daily.    ? insulin degludec (TRESIBA FLEXTOUCH) 100 UNIT/ML FlexTouch Pen Inject 26 Units into the skin daily. (Patient taking differently: Inject 20 Units into the skin daily.)   ? metFORMIN (GLUCOPHAGE-XR) 500 MG 24 hr tablet TAKE 3 TABLETS BY MOUTH ONCE DAILY WITH BREAKFAST   ? omeprazole (PRILOSEC) 40 MG capsule Take 1 capsule (40 mg total) by mouth daily before breakfast.   ? ONETOUCH ULTRA test strip Check blood sugar 1 x daily as directed   ? Semaglutide, 1 MG/DOSE, (OZEMPIC, 1 MG/DOSE,) 2 MG/1.5ML SOPN INJECT 1 MG SUBCUTANEOUSLY ONCE A WEEK   ? spironolactone (ALDACTONE) 25 MG tablet Take 1 tablet by mouth once daily   ? vitamin B-12 (CYANOCOBALAMIN) 1000  MCG tablet Take 1,000 mcg by mouth daily.    ? ?No facility-administered encounter medications on file as of 06/04/2021.  ? ? ?Patient Active Problem List  ? Diagnosis Date Noted  ? GERD (gastroesophageal reflux disease) 05/29/2021  ? Mixed incontinence 12/17/2020  ? Overweight with body mass index (BMI) of 29 to 29.9 in adult 08/28/2020  ? Osteoporosis 08/16/2019  ? Chronic bilateral low back pain without sciatica 11/22/2014  ? Chronic pain of both knees 11/22/2014  ? Essential hypertension  11/22/2014  ? Dyslipidemia associated with type 2 diabetes mellitus ([place]) 11/22/2014  ? Type 2 diabetes mellitus with hyperglycemia ([place]) 01/19/2013  ? ? ?Conditions to be addressed/monitored: DMII, Osteoporosis, and Chronic Pain ; Grief ? ?Care Plan : General Social Work (Adult)  ?Updates made by Rebekah Chesterfield, LCSW since 06/04/2021 12:00 AM  ?  ? ?Problem: Depression Identification (Depression)   ?  ? ?Long-Range Goal: Depressive Symptoms Identified   ?Start Date: 01/24/2020  ?Expected End Date: 09/16/2021  ?This Visit's Progress: On track  ?Recent Progress: On track  ?Priority: Medium  ?Note:   ?Current Barriers:  ?Acute Mental Health needs related to anxiety/stress ?Mental Health Concerns  ?Suicidal Ideation/Homicidal Ideation: No ?Clinical Social Work Goal(s):  ?Over the next 120 days, patient will work with SW bi-monthly by telephone or in person to reduce or manage symptoms related to anxiety ?Over the next 120 days, patient will work with SW to address concerns related to gaining mental health support and develop better coping skills to manage symptoms ?Interventions: ?Patient interviewed and appropriate assessments performed ?Pt is experiencing grief due to recent loss of nephew. Validation and encouragement provided. CCM LCSW discussed strategies to manage symptoms ?CCM LCSW and patient identified strategies to increase boundaries and self-care to promote health and well-being ?Patient is interested in initiation of marriage counseling (Bilingual counselor requested) LCSW will provided her options in-network ?Strategies to assist with increasing empathy and communication with spouse discussed. Patient provided verbal consent for CCM LCSW to complete referral to therapy ?Patient was successful in identifying healthy coping skills ?CCM LCSW discussed plans with patient for ongoing care management follow up and provided patient with direct contact information for care management team ?CCM LCSW advised patient  to contact CCM program for any urgent case management needs ?Solution-Focused Strategies, Active listening / Reflection utilized , Emotional Supportive Provided, Participation in counseling encouraged , and Verbalization of feelings encouraged  ?Patient Self Care Activities:  ?Attend all scheduled provider appointments ?Continue utilizing healthy coping skills and strategies on communication ?Contact clinic with any questions or concerns ? ?  ?  ? ?Follow Up Plan: Appointment scheduled for SW follow up with client by phone on: 07/30/21 ? ?Christa See, MSW, LCSW ?[facility] Care Management ?[facility] [facility] ?[email or web address] ?Phone [phone] ?1:59 PM ? ? ? ?

## 2021-06-04 NOTE — Patient Instructions (Signed)
Visit Information ? ?Thank you for taking time to visit with me today. Please don't hesitate to contact me if I can be of assistance to you before our next scheduled telephone appointment. ? ?Following are the goals we discussed today:  ?Patient Self Care Activities:  ?Attend all scheduled provider appointments ?Continue utilizing healthy coping skills and strategies on communication ?Contact clinic with any questions or concerns ? ?Our next appointment is by telephone on 07/30/21 at 10:00 AM ? ?Please call the care guide team at [phone] if you need to cancel or reschedule your appointment.  ? ?If you are experiencing a Mental Health or [place] or need someone to talk to, please call the Suicide and Crisis Lifeline: 988  ? ?Patient verbalizes understanding of instructions and care plan provided today and agrees to view in [place]. Active MyChart status confirmed with patient.   ? ?Christa See, MSW, LCSW ?[facility] Care Management ?[place] [facility] ?[email or web address] ?Phone [phone] ?1:57 PM ?  ?

## 2021-06-06 ENCOUNTER — Other Ambulatory Visit: Payer: Self-pay | Admitting: Internal Medicine

## 2021-06-06 DIAGNOSIS — Z794 Long term (current) use of insulin: Secondary | ICD-10-CM

## 2021-06-06 DIAGNOSIS — E1169 Type 2 diabetes mellitus with other specified complication: Secondary | ICD-10-CM

## 2021-06-06 NOTE — Telephone Encounter (Signed)
Requested Prescriptions  ?Pending Prescriptions Disp Refills  ?? atorvastatin (LIPITOR) 40 MG tablet [Pharmacy Med Name: Atorvastatin Calcium 40 MG Oral Tablet] 90 tablet 0  ?  Sig: Take 1 tablet by mouth once daily  ?  ? Cardiovascular:  Antilipid - Statins Failed - 06/06/2021  8:23 AM  ?  ?  Failed - Lipid Panel in normal range within the last 12 months  ?  Cholesterol  ?Date Value Ref Range Status  ?05/29/2021 133 <200 mg/dL Final  ? ?LDL Cholesterol (Calc)  ?Date Value Ref Range Status  ?05/29/2021 62 mg/dL (calc) Final  ?  Comment:  ?  Reference range: <100 ?Marland Kitchen ?Desirable range <100 mg/dL for primary prevention;   ?<70 mg/dL for patients with CHD or diabetic patients  ?with > or = 2 CHD risk factors. ?. ?LDL-C is now calculated using the Martin-Hopkins  ?calculation, which is a validated novel method providing  ?better accuracy than the Friedewald equation in the  ?estimation of LDL-C.  ?Cresenciano Genre et al. Annamaria Helling. 3704;888(91): 2061-2068  ?(http://education.[employer].com/faq/FAQ164) ?  ? ?HDL  ?Date Value Ref Range Status  ?05/29/2021 50 > OR = 50 mg/dL Final  ? ?Triglycerides  ?Date Value Ref Range Status  ?05/29/2021 130 <150 mg/dL Final  ? ?  ?  ?  Passed - Patient is not pregnant  ?  ?  Passed - Valid encounter within last 12 months  ?  Recent Outpatient Visits   ?      ? 1 week ago Type 2 diabetes mellitus with hyperglycemia, without long-term current use of insulin ([place])  ? [facility] [place], [place] W, NP  ? 2 months ago Gastroesophageal reflux disease without esophagitis  ? [place], DO  ? 5 months ago Encounter for general adult medical examination with abnormal findings  ? [facility] [place], [place] W, NP  ? 9 months ago Bilateral hip pain  ? [facility] [place], Coralie Keens, NP  ? 1 year ago Postmenopausal bleeding  ? [facility] [place], Devonne Doughty, DO  ?  ?  ? ?  ?  ?  ?? metFORMIN  (GLUCOPHAGE-XR) 500 MG 24 hr tablet [Pharmacy Med Name: metFORMIN HCl ER 500 MG Oral Tablet Extended Release 24 Hour] 270 tablet 0  ?  Sig: TAKE 3 TABLETS BY MOUTH ONCE DAILY WITH BREAKFAST  ?  ? Endocrinology:  Diabetes - Biguan[place] Failed - 06/06/2021  8:23 AM  ?  ?  Failed - B12 Level in normal range and within 720 days  ?  No results found for: VITAMINB12   ?  ?  Failed - CBC within normal limits and completed in the last 12 months  ?  WBC  ?Date Value Ref Range Status  ?05/29/2021 7.3 3.8 - 10.8 Thousand/uL Final  ? ?RBC  ?Date Value Ref Range Status  ?05/29/2021 4.86 3.80 - 5.10 Million/uL Final  ? ?Hemoglobin  ?Date Value Ref Range Status  ?05/29/2021 13.3 11.7 - 15.5 g/dL Final  ? ?HCT  ?Date Value Ref Range Status  ?05/29/2021 42.2 35.0 - 45.0 % Final  ? ?MCHC  ?Date Value Ref Range Status  ?05/29/2021 31.5 (L) 32.0 - 36.0 g/dL Final  ? ?MCH  ?Date Value Ref Range Status  ?05/29/2021 27.4 27.0 - 33.0 pg Final  ? ?MCV  ?Date Value Ref Range Status  ?05/29/2021 86.8 80.0 - 100.0 fL Final  ? ?No results found for: PLTCOUNTKUC, LABPLAT, [place] ?  RDW  ?Date Value Ref Range Status  ?05/29/2021 13.2 11.0 - 15.0 % Final  ? ?  ?  ?  Passed - Cr in normal range and within 360 days  ?  Creat  ?Date Value Ref Range Status  ?05/29/2021 0.66 0.50 - 1.05 mg/dL Final  ? ?Creatinine, Urine  ?Date Value Ref Range Status  ?12/17/2020 51 20 - 275 mg/dL Final  ?   ?  ?  Passed - HBA1C is between 0 and 7.9 and within 180 days  ?  Hgb A1c MFr Bld  ?Date Value Ref Range Status  ?05/29/2021 7.8 (H) <5.7 % of total Hgb Final  ?  Comment:  ?  For someone without known diabetes, a hemoglobin A1c ?value of 6.5% or greater indicates that they may have  ?diabetes and this should be confirmed with a follow-up  ?test. ?. ?For someone with known diabetes, a value <7% indicates  ?that their diabetes is well controlled and a value  ?greater than or equal to 7% indicates suboptimal  ?control. A1c targets should be individualized based on   ?duration of diabetes, age, comorbid conditions, and  ?other considerations. ?. ?Currently, no consensus exists regarding use of ?hemoglobin A1c for diagnosis of diabetes for children. ?. ?  ?   ?  ?  Passed - eGFR in normal range and within 360 days  ?  GFR, Est African American  ?Date Value Ref Range Status  ?06/10/2018 91 > OR = 60 mL/min/1.28m2 Final  ? ?GFR, Est Non African American  ?Date Value Ref Range Status  ?06/10/2018 79 > OR = 60 mL/min/1.75m2 Final  ? ?eGFR  ?Date Value Ref Range Status  ?05/29/2021 95 > OR = 60 mL/min/1.57m2 Final  ?  Comment:  ?  The eGFR is based on the CKD-EPI 2021 equation. To calculate  ?the new eGFR from a previous Creatinine or Cystatin C ?result, go to https://www.kidney.org/professionals/ ?kdoqi/gfr%5Fcalculator ?  ?   ?  ?  Passed - Valid encounter within last 6 months  ?  Recent Outpatient Visits   ?      ? 1 week ago Type 2 diabetes mellitus with hyperglycemia, without long-term current use of insulin ([place])  ? [facility] [place], [place] W, NP  ? 2 months ago Gastroesophageal reflux disease without esophagitis  ? [place], DO  ? 5 months ago Encounter for general adult medical examination with abnormal findings  ? [facility] [place], [place] W, NP  ? 9 months ago Bilateral hip pain  ? [facility] [place], Coralie Keens, NP  ? 1 year ago Postmenopausal bleeding  ? [place], DO  ?  ?  ? ?  ?  ?  ? ?

## 2021-06-10 ENCOUNTER — Ambulatory Visit: Payer: PPO | Admitting: Podiatry

## 2021-06-10 ENCOUNTER — Encounter: Payer: Self-pay | Admitting: Podiatry

## 2021-06-10 DIAGNOSIS — L600 Ingrowing nail: Secondary | ICD-10-CM | POA: Diagnosis not present

## 2021-06-10 MED ORDER — NEOMYCIN-POLYMYXIN-HC 3.5-10000-1 OT SOLN: OTIC | 0 refills | Status: DC

## 2021-06-10 NOTE — Progress Notes (Signed)
?Subjective:  ?Patient ID: Summer Young, female    DOB: November 06, 1950,  MRN: [id number] ?HPI ?Chief Complaint  ?Patient presents with  ? Nail Problem  ?  Patient presents today for painful left great toenail x 2 months.  She dosen't feel that it is an ingrown but she states "when the sheets touch the top of my nail its really sensitive"  ? ? ?71 y.o. female presents with the above complaint.  ? ?ROS: Denies fever chills nausea vomiting muscle aches pains calf pain back pain chest pain shortness of breath. ? ?Past Medical History:  ?Diagnosis Date  ? Allergy   ? Chronic pain of both knees   ? Diabetes mellitus without complication ([place])   ? Hyperlipidemia   ? Hypertension   ? ?Past Surgical History:  ?Procedure Laterality Date  ? ANKLE SURGERY Left 2009  ? DILATION AND CURETTAGE, DIAGNOSTIC / THERAPEUTIC  1998  ? miscarriage  ? KNEE SURGERY Left 1994  ? ACL  ? ? ?Current Outpatient Medications:  ?  neomycin-polymyxin-hydrocortisone (CORTISPORIN) OTIC solution, Apply 1-2 drops to toe after soaking, Disp: 10 mL, Rfl: 0 ?  acetaminophen (TYLENOL) 650 MG CR tablet, Take 650 mg by mouth 2 (two) times daily as needed for pain., Disp: , Rfl:  ?  alendronate (FOSAMAX) 70 MG tablet, Take 1 tablet (70 mg total) by mouth once a week. Take with a full glass of water on an empty stomach., Disp: 12 tablet, Rfl: 1 ?  Ascorbic Acid (VITAMIN C) 1000 MG tablet, Take 1,000 mg by mouth daily as needed. , Disp: , Rfl:  ?  atorvastatin (LIPITOR) 40 MG tablet, Take 1 tablet by mouth once daily, Disp: 90 tablet, Rfl: 0 ?  Biotin 1000 MCG tablet, Take 4,000 mcg by mouth every other day. , Disp: , Rfl:  ?  blood glucose meter kit and supplies, Dispense based on patient and insurance preference. Use up to four times daily as directed. (FOR ICD-10 E10.9, E11.9)., Disp: 1 each, Rfl: 0 ?  Cholecalciferol (VITAMIN D3) 50 MCG (2000 UT) capsule, Take 1,000 Units by mouth daily. , Disp: , Rfl:  ?  insulin degludec (TRESIBA FLEXTOUCH) 100 UNIT/ML  FlexTouch Pen, Inject 26 Units into the skin daily. (Patient taking differently: Inject 20 Units into the skin daily.), Disp: 15 mL, Rfl: 0 ?  metFORMIN (GLUCOPHAGE-XR) 500 MG 24 hr tablet, TAKE 3 TABLETS BY MOUTH ONCE DAILY WITH BREAKFAST, Disp: 270 tablet, Rfl: 0 ?  omeprazole (PRILOSEC) 40 MG capsule, Take 1 capsule (40 mg total) by mouth daily before breakfast., Disp: 30 capsule, Rfl: 2 ?  ONETOUCH ULTRA test strip, Check blood sugar 1 x daily as directed, Disp: 100 each, Rfl: 12 ?  Semaglutide, 1 MG/DOSE, (OZEMPIC, 1 MG/DOSE,) 2 MG/1.5ML SOPN, INJECT 1 MG SUBCUTANEOUSLY ONCE A WEEK, Disp: 4 mL, Rfl: 0 ?  spironolactone (ALDACTONE) 25 MG tablet, Take 1 tablet by mouth once daily, Disp: 90 tablet, Rfl: 0 ?  vitamin B-12 (CYANOCOBALAMIN) 1000 MCG tablet, Take 1,000 mcg by mouth daily. , Disp: , Rfl:  ? ?Allergies  ?Allergen Reactions  ? Aspirin Hives  ?  Other reaction(s): Other (See Comments) ?Other Reaction: Not Assessed ?  ? Insulin Detemir Hives and Rash  ?  Errythema, edema, heat at site of injection did not improve after 2 weeks of use.  ?  ? Shellfish Allergy Anaphylaxis  ? Dapagliflozin Other (See Comments)  ?  Yeast infections ?Yeast infection ?[place]  ? Influenza Vaccines Other (See Comments)  ?  Dulce Sellar  ? Other   ?  Other reaction(s): Other (See Comments) ?Uncoded Allergy. Allergen: Shellfish, Other Reaction: Not Assessed    Flu vaccine given 1998 caused anaphylaxis  ? ?Review of Systems ?Objective:  ?There were no vitals filed for this visit. ? ?General: Well developed, nourished, in no acute distress, alert and oriented x3  ? ?Dermatological: Skin is warm, dry and supple bilateral. Nails x 10 are well maintained; remaining integument appears unremarkable at this time. There are no open sores, no preulcerative lesions, no rash or signs of infection present.  Ingrown toenail tibial and fibular border of the hallux left with mild erythema.  No purulence no malodor sharp incurvated nail margins which  are exquisitely painful. ? ?Vascular: Dorsalis Pedis artery and Posterior Tibial artery pedal pulses are 2/4 bilateral with immedate capillary fill time. Pedal hair growth present. No varicosities and no lower extremity edema present bilateral.  ? ?Neruologic: Grossly intact via light touch bilateral. Vibratory intact via tuning fork bilateral. Protective threshold with Semmes Wienstein monofilament intact to all pedal sites bilateral. Patellar and Achilles deep tendon reflexes 2+ bilateral. No Babinski or clonus noted bilateral.  ? ?Musculoskeletal: No gross boney pedal deformities bilateral. No pain, crepitus, or limitation noted with foot and ankle range of motion bilateral. Muscular strength 5/5 in all groups tested bilateral. ? ?Gait: Unassisted, Nonantalgic.  ? ? ?Radiographs: ? ?None taken ? ?Assessment & Plan:  ? ?Assessment: Ingrown toenail tibial and fibular border hallux left ? ?Plan: Chemical matrixectomy was performed today after local anesthetic was administered.  Tolerated the procedure well.  Both oral and written home-going instructions were provided.  Was also provided with a prescription for Cortisporin Otic to be applied twice daily after soaking.  She will follow-up with me in 2 to 3 weeks. ? ? ? ? ?Tania Steinhauser T. Fox, DPM ?

## 2021-06-10 NOTE — Patient Instructions (Signed)

## 2021-06-12 ENCOUNTER — Telehealth: Payer: Self-pay | Admitting: Podiatry

## 2021-06-12 NOTE — Telephone Encounter (Signed)
Thank you :)

## 2021-06-12 NOTE — Telephone Encounter (Signed)
Called patient and gave instructions for soaking toe,verbalized understanding.

## 2021-06-12 NOTE — Telephone Encounter (Signed)
Patient called wants to know how long she needs to be soaking her feet and applying medicine?

## 2021-06-28 ENCOUNTER — Other Ambulatory Visit: Payer: Self-pay | Admitting: Family Medicine

## 2021-06-28 DIAGNOSIS — K279 Peptic ulcer, site unspecified, unspecified as acute or chronic, without hemorrhage or perforation: Secondary | ICD-10-CM

## 2021-06-28 DIAGNOSIS — R112 Nausea with vomiting, unspecified: Secondary | ICD-10-CM

## 2021-06-28 DIAGNOSIS — K219 Gastro-esophageal reflux disease without esophagitis: Secondary | ICD-10-CM

## 2021-07-01 NOTE — Telephone Encounter (Signed)
Requested Prescriptions  ?Pending Prescriptions Disp Refills  ?? omeprazole (PRILOSEC) 40 MG capsule [Pharmacy Med Name: Omeprazole 40 MG Oral Capsule Delayed Release] 90 capsule 0  ?  Sig: TAKE 1 CAPSULE BY MOUTH ONCE DAILY BEFORE BREAKFAST  ?  ? Gastroenterology: Proton Pump Inhibitors Passed - 06/28/2021  8:46 AM  ?  ?  Passed - Valid encounter within last 12 months  ?  Recent Outpatient Visits   ?      ? 1 month ago Type 2 diabetes mellitus with hyperglycemia, without long-term current use of insulin ([place])  ? [facility] [place], [place], NP  ? 3 months ago Gastroesophageal reflux disease without esophagitis  ? [place], DO  ? 6 months ago Encounter for general adult medical examination with abnormal findings  ? [facility] [place], [place], NP  ? 10 months ago Bilateral hip pain  ? [facility] [place], [place]alie Keens, NP  ? 1 year ago Postmenopausal bleeding  ? [place], DO  ?  ?  ? ?  ?  ?  ? ?

## 2021-07-03 ENCOUNTER — Ambulatory Visit: Payer: PPO | Admitting: Podiatry

## 2021-07-03 ENCOUNTER — Encounter: Payer: Self-pay | Admitting: Podiatry

## 2021-07-03 DIAGNOSIS — Z9889 Other specified postprocedural states: Secondary | ICD-10-CM

## 2021-07-03 DIAGNOSIS — L600 Ingrowing nail: Secondary | ICD-10-CM

## 2021-07-03 NOTE — Progress Notes (Signed)
She presents today for her postop follow-up regarding her matrixectomy hallux left.  States that she continues to soak it Epsom salts and warm water and continues the antibiotic. ? ?Objective: Vital signs stable alert oriented x3 tibial border of the hallux left demonstrates no erythema edema cellulitis drainage or odor just mild tenderness in the proximal tibial nail fold.  But no signs of infection. ? ?Assessment: Well-healing surgical toe. ? ?Plan: Encouraged her to continue to soak it for the next few days just to help get some of the soreness out cover during the daytime but leave open at bedtime follow-up with me if this is not completely well within 2 weeks ?

## 2021-07-11 ENCOUNTER — Ambulatory Visit: Payer: Self-pay

## 2021-07-11 ENCOUNTER — Ambulatory Visit (INDEPENDENT_AMBULATORY_CARE_PROVIDER_SITE_OTHER): Payer: PPO

## 2021-07-11 ENCOUNTER — Telehealth: Payer: PPO

## 2021-07-11 DIAGNOSIS — K219 Gastro-esophageal reflux disease without esophagitis: Secondary | ICD-10-CM

## 2021-07-11 DIAGNOSIS — G8929 Other chronic pain: Secondary | ICD-10-CM

## 2021-07-11 DIAGNOSIS — F439 Reaction to severe stress, unspecified: Secondary | ICD-10-CM

## 2021-07-11 DIAGNOSIS — I951 Orthostatic hypotension: Secondary | ICD-10-CM

## 2021-07-11 DIAGNOSIS — I10 Essential (primary) hypertension: Secondary | ICD-10-CM

## 2021-07-11 DIAGNOSIS — E1165 Type 2 diabetes mellitus with hyperglycemia: Secondary | ICD-10-CM

## 2021-07-11 DIAGNOSIS — M810 Age-related osteoporosis without current pathological fracture: Secondary | ICD-10-CM

## 2021-07-11 DIAGNOSIS — F419 Anxiety disorder, unspecified: Secondary | ICD-10-CM

## 2021-07-11 DIAGNOSIS — M545 Low back pain, unspecified: Secondary | ICD-10-CM

## 2021-07-11 DIAGNOSIS — E782 Mixed hyperlipidemia: Secondary | ICD-10-CM

## 2021-07-11 NOTE — Chronic Care Management (AMB) (Signed)
  Care Management   Follow Up Note   07/11/2021 Name: Summer Young MRN: [id number] DOB: 02-13-1951   Referred by: [name], NP Reason for referral : Chronic Care Management (RNCM: Call back to the patient to give the patient provider recommendations)   Call made back to the patient after talking to the pcp about the patient having dizziness, especially in the am. This has been happening more frequently over the last month. She denies any drop in her blood sugars as she has tested this at the time she is feeling dizzy. She does not have a way to check her blood pressures and ask about a blood pressure cuff. The patient maybe having possible orthostatic hypotension. She has had a history of high blood pressures but her blood pressures have been in range without the use of medications.  BP Readings from Last 3 Encounters:  05/29/21 122/66  03/15/21 113/80  12/17/20 130/86     Collaboration with the pcp. The pcp recommended the patient come in to be evaluated in the office with orthostatic vital signs. The patient was also requesting a script for a blood pressure cuff. The pcp can address this at the appointment with the patient. The patient agrees to the recommendations of the pcp and will call for an appointment with the pcp.   Follow Up Plan: Telephone follow up appointment with care management team member scheduled for: 09-26-2021 at 0900 am  Noreene Larsson RN, MSN, [place] Mobile: [phone]

## 2021-07-11 NOTE — Progress Notes (Signed)
I would recommend that she be seen in office for further evaluation so that we can do orthostatics in office.  We can give her prescription for a blood pressure cuff at that time.

## 2021-07-11 NOTE — Patient Instructions (Signed)
Visit Information  Thank you for taking time to visit with me today. Please don't hesitate to contact me if I can be of assistance to you before our next scheduled telephone appointment.  Following are the goals we discussed today:  Diabetes:  (Status: Goal on track: YES.)      Lab Results  Component Value Date    HGBA1C 7.8 (H) 05/29/2021  12-17-2020:  7.5% Assessed patient's understanding of A1c goal: <7%. 07-11-2021: The patient is frustrated because her blood sugars are up and down. She wanted her A1C to be down but it was up more. She has determined that rice is what is really throwing her blood sugars off. She is going to stop eating rice.  Provided education to patient about basic DM disease process. 07-11-2021: Review and education provided. The patient wanted to know if stress would be causing her blood sugars to be elevated. Discussed that stress definitely can impact her health and well being. Reflective listening and support given.  Reviewed medications with patient and discussed importance of medication adherence. 07-11-2021: The patient is compliant with her medications. Denies any issues with medications compliance        Reviewed prescribed diet with patient heart healthy/ADA. 12-17-2020: Supplied the patient with Glucerna samples to try to help with adding nutrients to her diet. 05-02-2021: The patient is eating well and denies any new issues with dietary restrictions. 07-11-2021: The patient is eating well. Has determined that even a little bit of rice makes her blood sugars go crazy. The patient states that she is going to eliminate rice altogether. Discussed emotional eating and monitoring for foods high in carbs ; Counseled on importance of regular laboratory monitoring as prescribed. 12-17-2020: The patient has pending lab work today. 03-07-2021: Has regular labwork. No acute findings. 05-02-2021: The patient requesting assistance with an appointment for follow up with the pcp for new  blood work to check A1C. 07-11-2021: The patient had A1C done recently and she knows what the goal of A1C levels are;        Discussed plans with patient for ongoing care management follow up and provided patient with direct contact information for care management team;      Provided patient with written educational materials related to hypo and hyperglycemia and importance of correct treatment. 03-07-2021: Lowest blood sugar reading has been 87. Highest she has seen is 125. 05-02-2021: States that her blood sugars are stable and doing well. Does need new lab work. Working with office staff to get an appointment.  07-11-2021: The patient states that her blood sugars have been up and down. The patient states that the lowest she has seen is 85 and the highest has been 165. Education and support given.     Reviewed scheduled/upcoming provider appointments including: 12-02-2021 at 8 am Advised patient, providing education and rationale, to check cbg as directed  and record. 03-07-2021:The patient is compliant with recommendations to check blood sugars. Gave range of blood sugars 87 to 125. Ask about the   freestyle [place]. Education given. The patient states she does not know that she needs this. 05-02-2021: The patient states that her levels are doing well. The patient denies any changes with her DM. 07-11-2021: The patient is having readings up and down. Is monitoring her diet closely and range has been 85 to 165. Will continue to monitor for changes.     call provider for findings outside established parameters. 12-17-2020: States her blood sugars are doing well and states  they are in the 100's. 07-11-2021: Knows to call for changes in condition and if her blood sugars are out of range      Referral made to pharmacy team for assistance with medications assistance program and effective management of DM. 07-11-2021: Is working with the pharm D for ongoing support and education for medications        Review of patient  status, including review of consultants reports, relevant laboratory and other test results, and medications completed;       Screening for signs and symptoms of depression related to chronic disease state;        Assessed social determinant of health barriers;          Osteoporosis  (Status: Goal on track: YES.) Evaluation of current treatment plan related to Osteoporosis,  self-management and patient's adherence to plan as established by provider. 05-02-2021: The patient is active and denies any issues. Review of safety precautions. 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 07-11-2021: Education and support given. The patient is very active and under a lot of stress right now. Discussed self care and focusing on her health and well being. Denies any safety concerns at this time. Will continue to monitor.  Discussed plans with patient for ongoing care management follow up and provided patient with direct contact information for care management team Advised patient to call the office for any falls, fractures, or changes in osteoporosis health; Provided education to patient re: safety, being careful and prevention of falls, notifying provider of changes in condtions; Reviewed medications with patient and discussed compliance. 07-11-2021: The patient is compliant with medications, denies any new concerns; Reviewed scheduled/upcoming provider appointments including : 12-02-2021 at 0800 am Discussed plans with patient for ongoing care management follow up and provided patient with direct contact information for care management team;   Hyperlipidemia:  (Status: Goal on track: YES.)      Lab Results  Component Value Date    CHOL 133 05/29/2021    HDL 50 05/29/2021    LDLCALC 62 05/29/2021    TRIG 130 05/29/2021    CHOLHDL 2.7 05/29/2021    Medication review performed; medication list updated in electronic medical record. 07-11-2021: Is  compliant with medications. Takes Atorvastatin 40 mg QD Provider established cholesterol goals reviewed. 07-11-2021: Praised for cholesterol levels being at goal; Counseled on importance of regular laboratory monitoring as prescribed. 07-11-2021: Knows to get regular labs done for monitoring  Provided HLD educational materials; Reviewed role and benefits of statin for ASCVD risk reduction; Discussed strategies to manage statin-induced myalgias; Reviewed importance of limiting foods high in cholesterol. 05-02-2021: Discussed heart healthy/ADA diet. The patient eats chicken but not a lot of other meats. She likes vegetables and is considering adding more beans to her dietary intake. Discussed foods low cholesterol options. 07-11-2021: The patient is being more mindful of her dietary intake. Education and support given to monitor for foods high in cholesterol.    Hypertension: (Status: Goal on track: YES.) Last practice recorded BP readings:     BP Readings from Last 3 Encounters:  05/29/21 122/66  03/15/21 113/80  12/17/20 130/86  Most recent eGFR/CrCl:       Lab Results  Component Value Date    EGFR 95 05/29/2021    No components found for: CRCL   Evaluation of current treatment plan related to hypertension self management and patient's adherence to plan as established by provider.  12-17-2020: The patient feels she is doing well with management of her HTN. Denies any headache pain. She is normotensive in the office today. 05-02-2021: Blood pressures are normal. No issues with HTN or heart health. 07-11-2021: The patient is having some dizziness in the mornings. After assessment these events are happening when she changes position. The patient states that she has noticed this more when she is moving. Education on orthostatic hypotension and likely her blood pressures dropping when she changes position. The patient ask about a blood pressure cuff to check her blood pressures. Will collaborate with the  pcp and pharm D for assistance. May be able to provide one from the office from donated cuffs. Will discuss with team and see what the best options are. Review of safety and fall precautions;   Provided education to patient re: stroke prevention, s/s of heart attack and stroke; Reviewed prescribed diet heart healthy/ADA. 07-11-2021: Review of heart healthy/ADA diet. The patient is being more mindful of her dietary restrictions  Reviewed medications with patient and discussed importance of compliance. 07-11-2021: The patient endorses compliance with medications.   Discussed plans with patient for ongoing care management follow up and provided patient with direct contact information for care management team; Advised patient, providing education and rationale, to monitor blood pressure daily and record, calling PCP for findings outside established parameters;  Reviewed scheduled/upcoming provider appointments including: Needs new appointment, awaiting assistance for new appointment.    Pain:  (Status: Goal on Track (progressing): YES.) Pain assessment performed. 12-17-2020: The patient in the office today to follow up with pcp for chronic back pain and discomfort. Recommendations provided to take Tylenol Arthritis 650 mg BID.  The patient states this is the biggest issue she has right now. She states she has to work to pay her bills. She says when she has to do the physical labor this exacerbates her pain in her back. Education and support given. 03-07-2021: The patient states she has been so busy that she has not had time to think about her pain. States she is doing well today. 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 Denies any acute findings. Will continue to monitor. 05-02-2021: The patient is rating her pain today at a 3 in her back. The patient states that was at a 10 yesterday.  She states she was getting stuff ready for her store and feels a lot better today.  07-11-2021: The patient is rating her pain at a 2 to 3 today. States that she is always having back pain but it is better since she is not cleaning. The patient states that she is trying to be more mindful of what she is doing with her activity. She can tolerate it better at this level.  Medications reviewed. 03-07-2021: The pcp recommended the patient take Tylenol Arthritis BID for pain relief and discomfort. 07-11-2021: The patient states that she is taking her medications when needed. Reviewed provider established plan for pain management. 12-06-2020: The patient states her pain level is a 7 today on a scale of 0-10. 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 She plans on talking to the pcp about options for pain management on 12-17-2020. Heat application and Tylenol Arthritis are sometimes effective. She denies any acute distress but knows she needs some kind of relief from her chronic back pain. Reminded the patient to write down questions to ask the pcp at her upcoming appointment.  Will continue to monitor. 12-17-2020: Discussed pacing her activity and resting when she needs to rest. The patient states that she feels great other than the back pain. She is going to start taking Tylenol Arthritis 650 mg BID to see how this works for her. She does lay down when the pain is intense. Discussed limiting physical activity when pain is at its worse. Empathetic listening and support given. 05-02-2021: The patient states she is doing well and is staying very busy. States her pain is on the back burner and she is working on getting her Orthoptist" open and running. 07-11-2021: The patient states currently what she is doing is managing her pain well. Will continue to monitor for changes.  Discussed importance of adherence to all scheduled medical appointments. 07-11-2021: Will see the pcp 12-02-2021; Counseled on the importance of reporting any/all new or changed pain symptoms or  management strategies to pain management provider; Advised patient to report to care team affect of pain on daily activities; Discussed use of relaxation techniques and/or diversional activities to assist with pain reduction (distraction, imagery, relaxation, massage, acupressure, TENS, heat, and cold application; Reviewed with patient prescribed pharmacological and nonpharmacological pain relief strategies; Advised patient to discuss unrelieved pain, changes in intensity and level of pain with provider; Screening for signs and symptoms of depression related to chronic disease state;  Assessed social determinant of health barriers;      GERD  (Status: Goal on Track (progressing): YES.) Long Term Goal  Evaluation of current treatment plan related to GERD,  self-management and patient's adherence to plan as established by provider. 07-11-2021: States her GERD is well controlled at this time. She is taking her medications as directed and monitoring dietary restrictions.  Discussed plans with patient for ongoing care management follow up and provided patient with direct contact information for care management team Advised patient to call the office for changes in GI issues, questions or concerns; Provided education to patient re: monitoring for foods that may exacerbate her GERD, reporting new changes in GERD, and reaching out to the CCM team or pcp for questions or concerns; Reviewed medications with patient and discussed 07-11-2021: The patient is compliant with medications and says the medications has been effective in managing her GERD; Discussed plans with patient for ongoing care management follow up and provided patient with direct contact information for care management team; Advised patient to discuss call for changes in GERD with provider;      Anxiety and Stress  (Status: New goal. Goal on Track (progressing): YES.) Long Term Goal  Evaluation of current treatment plan related to Anxiety and  stress , Mental Health Concerns  self-management and patient's adherence to plan as established by provider. Discussed plans with patient for ongoing care management follow up and provided patient with direct contact information for care management team Advised patient to call the office for changes in mood, anxiety, depression, or increased stressors impacting her care. The patient states that her marriage is not doing well and she is tired of dealing with her husband and the way he is acting. She wants to get out of debt and therefore she is looking to sell her house and rent something. She has people that she knows that are willing to help her and have her best interest at heart. Education and support given.; Provided education to patient re: self care, being mindful of how stress impacts her health and well being and trying to not worry about things out of her  control; Collaborated with LCSW  regarding stressors with her marriage impacting her health and well being; Reviewed scheduled/upcoming provider appointments including 12-02-2021 at 8 am; Social Work referral for ongoing support and education for mental health well being. ; Discussed plans with patient for ongoing care management follow up and provided patient with direct contact information for care management team; Advised patient to discuss mental health concerns with provider;     Our next appointment is by telephone on 09-26-2021 at 0900 am  Please call the care guide team at [phone] if you need to cancel or reschedule your appointment.   If you are experiencing a Mental Health or [place] or need someone to talk to, please call the Suicide and Crisis Lifeline: 988 call the [place] National Suicide Prevention Lifeline: (918)469-3956 or TTY: 603-617-4882 TTY 220-125-4548) to talk to a trained counselor call 1-800-273-TALK (toll free, 24 hour hotline)   Patient verbalizes understanding of instructions and care plan  provided today and agrees to view in [place]. Active MyChart status and patient understanding of how to access instructions and care plan via MyChart confirmed with patient.     Noreene Larsson RN, MSN, [place] Mobile: [phone]

## 2021-07-11 NOTE — Patient Instructions (Signed)
Visit Information  Thank you for taking time to visit with me today. Please don't hesitate to contact me if I can be of assistance to you before our next scheduled telephone appointment.  Following are the goals we discussed today:  Call made back to the patient after talking to the pcp about the patient having dizziness, especially in the am. This has been happening more frequently over the last month. She denies any drop in her blood sugars as she has tested this at the time she is feeling dizzy. She does not have a way to check her blood pressures and ask about a blood pressure cuff. The patient maybe having possible orthostatic hypotension. She has had a history of high blood pressures but her blood pressures have been in range without the use of medications.      BP Readings from Last 3 Encounters:  05/29/21 122/66  03/15/21 113/80  12/17/20 130/86      Collaboration with the pcp. The pcp recommended the patient come in to be evaluated in the office with orthostatic vital signs. The patient was also requesting a script for a blood pressure cuff. The pcp can address this at the appointment with the patient. The patient agrees to the recommendations of the pcp and will call for an appointment with the pcp.   Our next appointment is by telephone on 09-26-2021 at 0900 am  Please call the care guide team at [phone] if you need to cancel or reschedule your appointment.   If you are experiencing a Mental Health or [place] or need someone to talk to, please call the Suicide and Crisis Lifeline: 988 call the [place] National Suicide Prevention Lifeline: 770-740-5053 or TTY: (713)509-2191 TTY (858)836-0221) to talk to a trained counselor call 1-800-273-TALK (toll free, 24 hour hotline)   Patient verbalizes understanding of instructions and care plan provided today and agrees to view in [place]. Active MyChart status and patient understanding of how to access instructions and  care plan via MyChart confirmed with patient.     Noreene Larsson RN, MSN, [place] Mobile: [phone]

## 2021-07-11 NOTE — Chronic Care Management (AMB) (Signed)
Chronic Care Management   CCM RN Visit Note  07/11/2021 Name: Summer Young MRN: [id number] DOB: [birth year]  Subjective: Summer Young is a 71 y.o. year old female who is a primary care patient of [name], NP. The care management team was consulted for assistance with disease management and care coordination needs.    Engaged with patient by telephone for follow up visit in response to provider referral for case management and/or care coordination services.   Consent to Services:  The patient was given information about Chronic Care Management services, agreed to services, and gave verbal consent prior to initiation of services.  Please see initial visit note for detailed documentation.   Patient agreed to services and verbal consent obtained.   Assessment: Review of patient past medical history, allergies, medications, health status, including review of consultants reports, laboratory and other test data, was performed as part of comprehensive evaluation and provision of chronic care management services.   SDOH (Social Determinants of Health) assessments and interventions performed:    CCM Care Plan  Allergies  Allergen Reactions   Aspirin Hives    Other reaction(s): Other (See Comments) Other Reaction: Not Assessed    Insulin Detemir Hives and Rash    Errythema, edema, heat at site of injection did not improve after 2 weeks of use.     Shellfish Allergy Anaphylaxis   Dapagliflozin Other (See Comments)    Yeast infections Yeast infection Farxiga   Influenza Vaccines Other (See Comments)    Forxiga   Other     Other reaction(s): Other (See Comments) Uncoded Allergy. Allergen: Shellfish, Other Reaction: Not Assessed    Flu vaccine given 1998 caused anaphylaxis    Outpatient Encounter Medications as of 07/11/2021  Medication Sig Note   acetaminophen (TYLENOL) 650 MG CR tablet Take 650 mg by mouth 2 (two) times daily as needed for pain.    alendronate (FOSAMAX) 70 MG  tablet Take 1 tablet (70 mg total) by mouth once a week. Take with a full glass of water on an empty stomach.    Ascorbic Acid (VITAMIN C) 1000 MG tablet Take 1,000 mg by mouth daily as needed.  09/20/2018: As needed to prevent illness   atorvastatin (LIPITOR) 40 MG tablet Take 1 tablet by mouth once daily    Biotin 1000 MCG tablet Take 4,000 mcg by mouth every other day.  01/19/2018: Takes every other day   blood glucose meter kit and supplies Dispense based on patient and insurance preference. Use up to four times daily as directed. (FOR ICD-10 E10.9, E11.9).    Cholecalciferol (VITAMIN D3) 50 MCG (2000 UT) capsule Take 1,000 Units by mouth daily.     insulin degludec (TRESIBA FLEXTOUCH) 100 UNIT/ML FlexTouch Pen Inject 26 Units into the skin daily. (Patient taking differently: Inject 20 Units into the skin daily.)    metFORMIN (GLUCOPHAGE-XR) 500 MG 24 hr tablet TAKE 3 TABLETS BY MOUTH ONCE DAILY WITH BREAKFAST    neomycin-polymyxin-hydrocortisone (CORTISPORIN) OTIC solution Apply 1-2 drops to toe after soaking    omeprazole (PRILOSEC) 40 MG capsule TAKE 1 CAPSULE BY MOUTH ONCE DAILY BEFORE BREAKFAST    ONETOUCH ULTRA test strip Check blood sugar 1 x daily as directed    Semaglutide, 1 MG/DOSE, (OZEMPIC, 1 MG/DOSE,) 2 MG/1.5ML SOPN INJECT 1 MG SUBCUTANEOUSLY ONCE A WEEK    spironolactone (ALDACTONE) 25 MG tablet Take 1 tablet by mouth once daily    vitamin B-12 (CYANOCOBALAMIN) 1000 MCG tablet Take 1,000 mcg by mouth daily.  No facility-administered encounter medications on file as of 07/11/2021.    Patient Active Problem List   Diagnosis Date Noted   GERD (gastroesophageal reflux disease) 05/29/2021   Mixed incontinence 12/17/2020   Overweight with body mass index (BMI) of 29 to 29.9 in adult 08/28/2020   Osteoporosis 08/16/2019   Chronic bilateral low back pain without sciatica 11/22/2014   Chronic pain of both knees 11/22/2014   Essential hypertension 11/22/2014   Dyslipidemia  associated with type 2 diabetes mellitus ([place]) 11/22/2014   Type 2 diabetes mellitus with hyperglycemia ([place]) 01/19/2013    Conditions to be addressed/monitored:HTN, HLD, DMII, Anxiety, Osteoporosis, and possible orthostatic hypotension, chronic pain, GERD, and stress  Care Plan : RNCM: General Plan of Care (Adult) for Chronic Disease Management and Care Coordination Needs  Updates made by [name], RN since 07/11/2021 12:00 AM     Problem: RNCM: Development for plan of care for Chronic Disease Management (HTN, HLD, DM, Chronic pain, osteoporosis)   Priority: High     Long-Range Goal: RNCM: Development for plan of care for Chronic Disease Management (HTN, HLD, DM, Chronic pain, osteoporosis)   Start Date: 12/06/2020  Expected End Date: 12/06/2021  Priority: High  Note:   Current Barriers:  Knowledge Deficits related to plan of care for management of HTN, HLD, DMII, and Chronic pain and osteoporosis   Chronic Disease Management support and education needs related to HTN, HLD, DMII, and Chronic pain and osteoporosis   RNCM Clinical Goal(s):  Patient will verbalize understanding of plan for management of HTN, HLD, DMII, Osteoporosis, and chronic pain  verbalize basic understanding of HTN, HLD, DMII, Osteoporosis, and chronic pain disease process and self health management plan  take all medications exactly as prescribed and will call provider for medication related questions demonstrate understanding of rationale for each prescribed medication  attend all scheduled medical appointments:Patient requesting a new appointment for follow up with the pcp and blood work. Have contacted the administrative staff for assistance  demonstrate improved and ongoing adherence to prescribed treatment plan for HTN, HLD, DMII, Osteoporosis, and Chronic pain as evidenced by daily monitoring and recording of CBG  adherence to ADA/ carb modified diet adherence to prescribed medication regimen contacting  provider for new or worsened symptoms or questions  demonstrate improved and ongoing health management independence  work with pharmacist to address DM management and medications needs related to DMII demonstrate a decrease in HTN, HLD, DMII, Osteoporosis, and chronic pain exacerbations  demonstrate ongoing self health care management ability for effective management of chronic conditions  through collaboration with RN Care manager, provider, and care team.   Interventions: 1:1 collaboration with primary care provider regarding development and update of comprehensive plan of care as evidenced by provider attestation and co-signature Inter-disciplinary care team collaboration (see longitudinal plan of care) Evaluation of current treatment plan related to  self management and patient's adherence to plan as established by provider   SDOH Barriers (Status: Goal on track: YES.)  Patient interviewed and SDOH assessment performed        SDOH Interventions    Flowsheet Row Most Recent Value  SDOH Interventions   Food Insecurity Interventions Intervention Not Indicated  Financial Strain Interventions Other (Comment)  [patient assistance program for medications]  Housing Interventions Intervention Not Indicated  Intimate Partner Violence Interventions Intervention Not Indicated  Physical Activity Interventions Other (Comments)  [the patient is active and cleans houses, 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]  Stress Interventions Intervention Not Indicated  Social Connections Interventions Other (Comment)  [good support system]  Transportation Interventions Intervention Not Indicated     Patient interviewed and appropriate assessments performed Provided patient with information about resources available for SDOH needs  Discussed plans with patient for ongoing care management follow up and provided patient with direct contact information for care management team Advised patient to call the  office for changes in [place], questions or concerns    Diabetes:  (Status: Goal on track: YES.) Lab Results  Component Value Date   HGBA1C 7.8 (H) 05/29/2021  12-17-2020:  7.5% Assessed patient's understanding of A1c goal: <7%. 07-11-2021: The patient is frustrated because her blood sugars are up and down. She wanted her A1C to be down but it was up more. She has determined that rice is what is really throwing her blood sugars off. She is going to stop eating rice.  Provided education to patient about basic DM disease process. 07-11-2021: Review and education provided. The patient wanted to know if stress would be causing her blood sugars to be elevated. Discussed that stress definitely can impact her health and well being. Reflective listening and support given.  Reviewed medications with patient and discussed importance of medication adherence. 07-11-2021: The patient is compliant with her medications. Denies any issues with medications compliance        Reviewed prescribed diet with patient heart healthy/ADA. 12-17-2020: Supplied the patient with Glucerna samples to try to help with adding nutrients to her diet. 05-02-2021: The patient is eating well and denies any new issues with dietary restrictions. 07-11-2021: The patient is eating well. Has determined that even a little bit of rice makes her blood sugars go crazy. The patient states that she is going to eliminate rice altogether. Discussed emotional eating and monitoring for foods high in carbs ; Counseled on importance of regular laboratory monitoring as prescribed. 12-17-2020: The patient has pending lab work today. 03-07-2021: Has regular labwork. No acute findings. 05-02-2021: The patient requesting assistance with an appointment for follow up with the pcp for new blood work to check A1C. 07-11-2021: The patient had A1C done recently and she knows what the goal of A1C levels are;        Discussed plans with patient for ongoing care management follow  up and provided patient with direct contact information for care management team;      Provided patient with written educational materials related to hypo and hyperglycemia and importance of correct treatment. 03-07-2021: Lowest blood sugar reading has been 87. Highest she has seen is 125. 05-02-2021: States that her blood sugars are stable and doing well. Does need new lab work. Working with office staff to get an appointment.  07-11-2021: The patient states that her blood sugars have been up and down. The patient states that the lowest she has seen is 85 and the highest has been 165. Education and support given.     Reviewed scheduled/upcoming provider appointments including: 12-02-2021 at 8 am Advised patient, providing education and rationale, to check cbg as directed  and record. 03-07-2021:The patient is compliant with recommendations to check blood sugars. Gave range of blood sugars 87 to 125. Ask about the   freestyle [place]. Education given. The patient states she does not know that she needs this. 05-02-2021: The patient states that her levels are doing well. The patient denies any changes with her DM. 07-11-2021: The patient is having readings up and down. Is monitoring her diet closely and range has been 85 to 165. Will  continue to monitor for changes.     call provider for findings outside established parameters. 12-17-2020: States her blood sugars are doing well and states they are in the 100's. 07-11-2021: Knows to call for changes in condition and if her blood sugars are out of range      Referral made to pharmacy team for assistance with medications assistance program and effective management of DM. 07-11-2021: Is working with the pharm D for ongoing support and education for medications        Review of patient status, including review of consultants reports, relevant laboratory and other test results, and medications completed;       Screening for signs and symptoms of depression related to chronic  disease state;        Assessed social determinant of health barriers;         Osteoporosis  (Status: Goal on track: YES.) Evaluation of current treatment plan related to Osteoporosis,  self-management and patient's adherence to plan as established by provider. 05-02-2021: The patient is active and denies any issues. Review of safety precautions. 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 07-11-2021: Education and support given. The patient is very active and under a lot of stress right now. Discussed self care and focusing on her health and well being. Denies any safety concerns at this time. Will continue to monitor.  Discussed plans with patient for ongoing care management follow up and provided patient with direct contact information for care management team Advised patient to call the office for any falls, fractures, or changes in osteoporosis health; Provided education to patient re: safety, being careful and prevention of falls, notifying provider of changes in condtions; Reviewed medications with patient and discussed compliance. 07-11-2021: The patient is compliant with medications, denies any new concerns; Reviewed scheduled/upcoming provider appointments including : 12-02-2021 at 0800 am Discussed plans with patient for ongoing care management follow up and provided patient with direct contact information for care management team;  Hyperlipidemia:  (Status: Goal on track: YES.) Lab Results  Component Value Date   CHOL 133 05/29/2021   HDL 50 05/29/2021   LDLCALC 62 05/29/2021   TRIG 130 05/29/2021   CHOLHDL 2.7 05/29/2021   Medication review performed; medication list updated in electronic medical record. 07-11-2021: Is compliant with medications. Takes Atorvastatin 40 mg QD Provider established cholesterol goals reviewed. 07-11-2021: Praised for cholesterol levels being at goal; Counseled on importance of regular laboratory  monitoring as prescribed. 07-11-2021: Knows to get regular labs done for monitoring  Provided HLD educational materials; Reviewed role and benefits of statin for ASCVD risk reduction; Discussed strategies to manage statin-induced myalgias; Reviewed importance of limiting foods high in cholesterol. 05-02-2021: Discussed heart healthy/ADA diet. The patient eats chicken but not a lot of other meats. She likes vegetables and is considering adding more beans to her dietary intake. Discussed foods low cholesterol options. 07-11-2021: The patient is being more mindful of her dietary intake. Education and support given to monitor for foods high in cholesterol.   Hypertension: (Status: Goal on track: YES.) Last practice recorded BP readings:  BP Readings from Last 3 Encounters:  05/29/21 122/66  03/15/21 113/80  12/17/20 130/86  Most recent eGFR/CrCl:  Lab Results  Component Value Date   EGFR 95 05/29/2021    No components found for: CRCL  Evaluation of current treatment plan related to hypertension self management and patient's adherence to plan as established  by provider. 12-17-2020: The patient feels she is doing well with management of her HTN. Denies any headache pain. She is normotensive in the office today. 05-02-2021: Blood pressures are normal. No issues with HTN or heart health. 07-11-2021: The patient is having some dizziness in the mornings. After assessment these events are happening when she changes position. The patient states that she has noticed this more when she is moving. Education on orthostatic hypotension and likely her blood pressures dropping when she changes position. The patient ask about a blood pressure cuff to check her blood pressures. Will collaborate with the pcp and pharm D for assistance. May be able to provide one from the office from donated cuffs. Will discuss with team and see what the best options are. Review of safety and fall precautions;   Provided education to patient  re: stroke prevention, s/s of heart attack and stroke; Reviewed prescribed diet heart healthy/ADA. 07-11-2021: Review of heart healthy/ADA diet. The patient is being more mindful of her dietary restrictions  Reviewed medications with patient and discussed importance of compliance. 07-11-2021: The patient endorses compliance with medications.   Discussed plans with patient for ongoing care management follow up and provided patient with direct contact information for care management team; Advised patient, providing education and rationale, to monitor blood pressure daily and record, calling PCP for findings outside established parameters;  Reviewed scheduled/upcoming provider appointments including: Needs new appointment, awaiting assistance for new appointment.   Pain:  (Status: Goal on Track (progressing): YES.) Pain assessment performed. 12-17-2020: The patient in the office today to follow up with pcp for chronic back pain and discomfort. Recommendations provided to take Tylenol Arthritis 650 mg BID.  The patient states this is the biggest issue she has right now. She states she has to work to pay her bills. She says when she has to do the physical labor this exacerbates her pain in her back. Education and support given. 03-07-2021: The patient states she has been so busy that she has not had time to think about her pain. States she is doing well today. 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 Denies any acute findings. Will continue to monitor. 05-02-2021: The patient is rating her pain today at a 3 in her back. The patient states that was at a 10 yesterday.  She states she was getting stuff ready for her store and feels a lot better today. 07-11-2021: The patient is rating her pain at a 2 to 3 today. States that she is always having back pain but it is better since she is not cleaning. The patient states that she is trying to be more mindful of what she is doing  with her activity. She can tolerate it better at this level.  Medications reviewed. 03-07-2021: The pcp recommended the patient take Tylenol Arthritis BID for pain relief and discomfort. 07-11-2021: The patient states that she is taking her medications when needed. Reviewed provider established plan for pain management. 12-06-2020: The patient states her pain level is a 7 today on a scale of 0-10. 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 She plans on talking to the pcp about options for pain management on 12-17-2020. Heat application and Tylenol Arthritis are sometimes effective. She denies any acute distress but knows she needs some kind of relief from her chronic back pain. Reminded the patient to write down questions to ask the pcp at her upcoming  appointment. Will continue to monitor. 12-17-2020: Discussed pacing her activity and resting when she needs to rest. The patient states that she feels great other than the back pain. She is going to start taking Tylenol Arthritis 650 mg BID to see how this works for her. She does lay down when the pain is intense. Discussed limiting physical activity when pain is at its worse. Empathetic listening and support given. 05-02-2021: The patient states she is doing well and is staying very busy. States her pain is on the back burner and she is working on getting her Orthoptist" open and running. 07-11-2021: The patient states currently what she is doing is managing her pain well. Will continue to monitor for changes.  Discussed importance of adherence to all scheduled medical appointments. 07-11-2021: Will see the pcp 12-02-2021; Counseled on the importance of reporting any/all new or changed pain symptoms or management strategies to pain management provider; Advised patient to report to care team affect of pain on daily activities; Discussed use of relaxation techniques and/or diversional activities to assist with pain reduction  (distraction, imagery, relaxation, massage, acupressure, TENS, heat, and cold application; Reviewed with patient prescribed pharmacological and nonpharmacological pain relief strategies; Advised patient to discuss unrelieved pain, changes in intensity and level of pain with provider; Screening for signs and symptoms of depression related to chronic disease state;  Assessed social determinant of health barriers;    GERD  (Status: Goal on Track (progressing): YES.) Long Term Goal  Evaluation of current treatment plan related to GERD,  self-management and patient's adherence to plan as established by provider. 07-11-2021: States her GERD is well controlled at this time. She is taking her medications as directed and monitoring dietary restrictions.  Discussed plans with patient for ongoing care management follow up and provided patient with direct contact information for care management team Advised patient to call the office for changes in GI issues, questions or concerns; Provided education to patient re: monitoring for foods that may exacerbate her GERD, reporting new changes in GERD, and reaching out to the CCM team or pcp for questions or concerns; Reviewed medications with patient and discussed 07-11-2021: The patient is compliant with medications and says the medications has been effective in managing her GERD; Discussed plans with patient for ongoing care management follow up and provided patient with direct contact information for care management team; Advised patient to discuss call for changes in GERD with provider;    Anxiety and Stress  (Status: New goal. Goal on Track (progressing): YES.) Long Term Goal  Evaluation of current treatment plan related to Anxiety and stress , Mental Health Concerns  self-management and patient's adherence to plan as established by provider. Discussed plans with patient for ongoing care management follow up and provided patient with direct contact information  for care management team Advised patient to call the office for changes in mood, anxiety, depression, or increased stressors impacting her care. The patient states that her marriage is not doing well and she is tired of dealing with her husband and the way he is acting. She wants to get out of debt and therefore she is looking to sell her house and rent something. She has people that she knows that are willing to help her and have her best interest at heart. Education and support given.; Provided education to patient re: self care, being mindful of how stress impacts her health and well being and trying to not worry about things out of her control; Collaborated with  LCSW  regarding stressors with her marriage impacting her health and well being; Reviewed scheduled/upcoming provider appointments including 12-02-2021 at 8 am; Social Work referral for ongoing support and education for mental health well being. ; Discussed plans with patient for ongoing care management follow up and provided patient with direct contact information for care management team; Advised patient to discuss mental health concerns with provider;   Patient Goals/Self-Care Activities: Patient will self administer medications as prescribed as evidenced by self report/primary caregiver report  Patient will attend all scheduled provider appointments as evidenced by clinician review of documented attendance to scheduled appointments and patient/caregiver report Patient will call pharmacy for medication refills as evidenced by patient report and review of pharmacy fill history as appropriate Patient will attend church or other social activities as evidenced by patient report Patient will continue to perform ADL's independently as evidenced by patient/caregiver report Patient will continue to perform IADL's independently as evidenced by patient/caregiver report Patient will call provider office for new concerns or questions as evidenced  by review of documented incoming telephone call notes and patient report Patient will work with BSW to address care coordination needs and will continue to work with the clinical team to address health care and disease management related needs as evidenced by documented adherence to scheduled care management/care coordination appointments - schedule appointment with eye doctor - check blood sugar at prescribed times: before meals and at bedtime, when you have symptoms of low or high blood sugar, and before and after exercise - check feet daily for cuts, sores or redness - enter blood sugar readings and medication or insulin into daily log - take the blood sugar log to all doctor visits - trim toenails straight across - drink 6 to 8 glasses of water each day - eat fish at least once per week - fill half of plate with vegetables - keep a food diary - limit fast food meals to no more than 1 per week - manage portion size - prepare main meal at home 3 to 5 days each week - read food labels for fat, fiber, carbohydrates and portion size - reduce red meat to 2 to 3 times a week - switch to sugar-free drinks - keep feet up while sitting - wash and dry feet carefully every day - wear comfortable, cotton socks - wear comfortable, well-fitting shoes - check blood pressure weekly - choose a place to take my blood pressure (home, clinic or office, retail store) - write blood pressure results in a log or diary - learn about high blood pressure - keep a blood pressure log - take blood pressure log to all doctor appointments - call doctor for signs and symptoms of high blood pressure - develop an action plan for high blood pressure - keep all doctor appointments - take medications for blood pressure exactly as prescribed - report new symptoms to your doctor - eat more whole grains, fruits and vegetables, lean meats and healthy fats - call for medicine refill 2 or 3 days before it runs out - take  all medications exactly as prescribed - call doctor with any symptoms you believe are related to your medicine - call doctor when you experience any new symptoms - go to all doctor appointments as scheduled - adhere to prescribed diet: heart healthy/ADA diet       Plan:Telephone follow up appointment with care management team member scheduled for:  09-26-2021 at 0900 am  Noreene Larsson RN, MSN, [place] Coordinator  [facility] Mobile: [phone]

## 2021-07-12 ENCOUNTER — Ambulatory Visit: Payer: Self-pay | Admitting: Pharmacist

## 2021-07-12 DIAGNOSIS — I10 Essential (primary) hypertension: Secondary | ICD-10-CM

## 2021-07-12 NOTE — Patient Instructions (Signed)
Visit Information  Thank you for taking time to visit with me today. Please don't hesitate to contact me if I can be of assistance to you before our next scheduled telephone appointment.  Following are the goals we discussed today:   Goals Addressed             This Visit's Progress    Pharmacy Goals       Our goal A1c is less than 7%. This corresponds with fasting sugars less than 130 and 2 hour after meal sugars less than 180. Please check your blood sugars and keep log of results  Our goal bad cholesterol, or LDL, is less than 70 . This is why it is important to continue taking your atorvastatin  Please check your home blood pressure, keep a log of the results and bring this with you to your medical appointments.  Feel free to call me with any questions or concerns. I look forward to our next call!   Wallace Cullens, PharmD, [place] [phone]          Our next appointment is by telephone on 07/24/2021 at 8:30 AM  Please call the care guide team at [phone] if you need to cancel or reschedule your appointment.    Patient verbalizes understanding of instructions and care plan provided today and agrees to view in [place]. Active MyChart status and patient understanding of how to access instructions and care plan via MyChart confirmed with patient.

## 2021-07-12 NOTE — Chronic Care Management (AMB) (Signed)
 Chronic Care Management CCM Pharmacy Note  07/12/2021 Name:  Summer Young MRN:  [id number] DOB:  01/17/1951   Subjective: Summer Young is an 71 y.o. year old female who is a primary patient of Baity, Regina W, NP.  The CCM team was consulted for assistance with disease management and care coordination needs.    Engaged with patient by telephone for follow up visit for pharmacy case management and/or care coordination services.   Objective:  Medications Reviewed Today     Reviewed by Tate, Pamela J, RN (Case Manager) on 07/11/21 at 0914  Med List Status: <None>   Medication Order Taking? Sig Documenting Provider Last Dose Status Informant  acetaminophen (TYLENOL) 650 MG CR tablet [id number] No Take 650 mg by mouth 2 (two) times daily as needed for pain. [provider] Taking Active   alendronate (FOSAMAX) 70 MG tablet [id number] No Take 1 tablet (70 mg total) by mouth once a week. Take with a full glass of water on an empty stomach. Karamalegos, Alexander J, DO Taking Active   Ascorbic Acid (VITAMIN C) 1000 MG tablet [id number] No Take 1,000 mg by mouth daily as needed.  [provider] Taking Active            Med Note (DHALLA, ELISABETH A   Mon Sep 20, 2018  4:05 PM) As needed to prevent illness  atorvastatin (LIPITOR) 40 MG tablet [id number]  Take 1 tablet by mouth once daily Baity, Regina W, NP  Active   Biotin 1000 MCG tablet [id number] No Take 4,000 mcg by mouth every other day.  [provider] Taking Active Self           Med Note (HAUSER, JANET S   Tue Jan 19, 2018 11:31 AM) Takes every other day  blood glucose meter kit and supplies [id number] No Dispense based on patient and insurance preference. Use up to four times daily as directed. (FOR ICD-10 E10.9, E11.9). Malfi, Nicole M, FNP Taking Active   Cholecalciferol (VITAMIN D3) 50 MCG (2000 UT) capsule [id number] No Take 1,000 Units by mouth daily.  [provider] Taking Active   insulin degludec  (TRESIBA FLEXTOUCH) 100 UNIT/ML FlexTouch Pen [id number] No Inject 26 Units into the skin daily.  Patient taking differently: Inject 20 Units into the skin daily.   Baity, Regina W, NP Taking Active   metFORMIN (GLUCOPHAGE-XR) 500 MG 24 hr tablet [id number]  TAKE 3 TABLETS BY MOUTH ONCE DAILY WITH BREAKFAST Baity, Regina W, NP  Active   neomycin-polymyxin-hydrocortisone (CORTISPORIN) OTIC solution [id number]  Apply 1-2 drops to toe after soaking Hyatt, Max T, DPM  Active   omeprazole (PRILOSEC) 40 MG capsule [id number]  TAKE 1 CAPSULE BY MOUTH ONCE DAILY BEFORE BREAKFAST Baity, Regina W, NP  Active   ONETOUCH ULTRA test strip [id number] No Check blood sugar 1 x daily as directed Karamalegos, Alexander J, DO Taking Active   Semaglutide, 1 MG/DOSE, (OZEMPIC, 1 MG/DOSE,) 2 MG/1.5ML SOPN [id number] No INJECT 1 MG SUBCUTANEOUSLY ONCE A WEEK Baity, Regina W, NP Taking Active   spironolactone (ALDACTONE) 25 MG tablet [id number] No Take 1 tablet by mouth once daily Baity, Regina W, NP Taking Active   vitamin B-12 (CYANOCOBALAMIN) 1000 MCG tablet [id number] No Take 1,000 mcg by mouth daily.  [provider] Taking Active             Pertinent Labs:   Lab Results  Component Value Date   CREATININE 0.66 05/29/2021   BUN 13 05/29/2021     NA 141 05/29/2021   K 5.0 05/29/2021   CL 107 05/29/2021   CO2 27 05/29/2021   BP Readings from Last 3 Encounters:  05/29/21 122/66  03/15/21 113/80  12/17/20 130/86   Pulse Readings from Last 3 Encounters:  05/29/21 79  03/15/21 85  12/17/20 76     SDOH:  (Social Determinants of Health) assessments and interventions performed:    [place]  Review of patient past medical history, allergies, medications, health status, including review of consultants reports, laboratory and other test data, was performed as part of comprehensive evaluation and provision of chronic care management services.   Care Plan : General Pharmacy (Adult)  Updates made  by Rennis Petty, RPH-CPP since 07/12/2021 12:00 AM     Problem: Disease Progression      Long-Range Goal: Disease Progression Prevented or Minimized   Start Date: 01/23/2020  Expected End Date: 04/22/2020  Recent Progress: On track  Priority: High  Note:   Current Barriers:  Unable to independently afford treatment regimen Patient APPROVED for [name] and Ozempic medication assistance from [employer] for 2023 calendar year  Pharmacist Clinical Goal(s):  Over the next 90 days, patient will achieve control of T2DM as evidenced by A1C <7.0%. through collaboration with PharmD and provider.  Over the next 90 days, patient will maintain control of LDL as evidenced by LDL <70 mg/dL through collaboration with PharmD and provider.  Over the next 90 days, patient will verbalize ability to afford treatment regimen. through collaboration with PharmD and provider.  Interventions: Inter-disciplinary care team collaboration (see longitudinal plan of care) 1:1 collaboration with [name], NP regarding development and update of comprehensive plan of care as evidenced by provider attestation and co-signature Receive message from [facility] Nurse Case Manager stating "patient is likely having some orthostatic hypotension and is noticing when she changes position especially in the am. She ask about a blood pressure cuff script." Note PCP responded recommending patient be seen in office for further evaluation/orthostatics in office  Hypertension/morning dizziness: Current treatment: Spironolactone 25 mg daily Today follow up with patient to let her know blood pressure monitor is not covered through her insurance. Discuss cost of obtaining and upper arm monitor from local stores. Patient reports that she is taking positional changes slowly Encourage patient to stay well hydrated Denies hypoglycemia Encourage patient to call office to schedule the follow up appointment with PCP   Patient  Goals/Self-Care Activities Over the next 90 days, patient will:  Take medications as prescribed Check blood glucose daily, document, and provide at future appointments Keeps log in App on phone Collaborate with provider on medication access solutions Attends all scheduled provider appointments Calls pharmacy for medication refills Calls provider office for new concerns or questions  Follow Up Plan: Telephone follow up appointment with care management team member scheduled for: 07/24/2021 at 8:30 AM      Wallace Cullens, PharmD, [place] [phone]

## 2021-07-17 DIAGNOSIS — I10 Essential (primary) hypertension: Secondary | ICD-10-CM

## 2021-07-17 DIAGNOSIS — E1169 Type 2 diabetes mellitus with other specified complication: Secondary | ICD-10-CM

## 2021-07-17 DIAGNOSIS — Z794 Long term (current) use of insulin: Secondary | ICD-10-CM

## 2021-07-17 DIAGNOSIS — E785 Hyperlipidemia, unspecified: Secondary | ICD-10-CM

## 2021-07-17 DIAGNOSIS — F419 Anxiety disorder, unspecified: Secondary | ICD-10-CM

## 2021-07-17 DIAGNOSIS — M810 Age-related osteoporosis without current pathological fracture: Secondary | ICD-10-CM

## 2021-07-23 ENCOUNTER — Ambulatory Visit: Payer: PPO | Admitting: Internal Medicine

## 2021-07-23 NOTE — Progress Notes (Deleted)
Subjective:    Patient ID: Summer Young, female    DOB: [birth year], 71 y.o.   MRN: [id number]  HPI  Patient wanting to follow-up on HTN, HLD and DM2:  HTN: Her BP today is.  She is taking Spironolactone as prescribed.  ECG from 08/2018 reviewed.  HLD: Her last LDL was 62, triglycerides 130, 05/2021.  She denies myalgias on Atorvastatin.  She tries to consume low-fat diet.  DM2: Her last A1c was 7.8%, 05/2021.  She is taking Metformin, Ozempic and [place] as prescribed.  Her sugars range.  She checks her feet routinely.  Her last eye exam was 05/2021.  Flu never.  Pneumovax 09/2020.  Prevnar 11/2017.  [place] x 3.  Review of Systems  Past Medical History:  Diagnosis Date   Allergy    Chronic pain of both knees    Diabetes mellitus without complication (HCC)    Hyperlipidemia    Hypertension     Current Outpatient Medications  Medication Sig Dispense Refill   acetaminophen (TYLENOL) 650 MG CR tablet Take 650 mg by mouth 2 (two) times daily as needed for pain.     alendronate (FOSAMAX) 70 MG tablet Take 1 tablet (70 mg total) by mouth once a week. Take with a full glass of water on an empty stomach. 12 tablet 1   Ascorbic Acid (VITAMIN C) 1000 MG tablet Take 1,000 mg by mouth daily as needed.      atorvastatin (LIPITOR) 40 MG tablet Take 1 tablet by mouth once daily 90 tablet 0   Biotin 1000 MCG tablet Take 4,000 mcg by mouth every other day.      blood glucose meter kit and supplies Dispense based on patient and insurance preference. Use up to four times daily as directed. (FOR ICD-10 E10.9, E11.9). 1 each 0   Cholecalciferol (VITAMIN D3) 50 MCG (2000 UT) capsule Take 1,000 Units by mouth daily.      insulin degludec (TRESIBA FLEXTOUCH) 100 UNIT/ML FlexTouch Pen Inject 26 Units into the skin daily. (Patient taking differently: Inject 20 Units into the skin daily.) 15 mL 0   metFORMIN (GLUCOPHAGE-XR) 500 MG 24 hr tablet TAKE 3 TABLETS BY MOUTH ONCE DAILY WITH BREAKFAST 270 tablet 0    neomycin-polymyxin-hydrocortisone (CORTISPORIN) OTIC solution Apply 1-2 drops to toe after soaking 10 mL 0   omeprazole (PRILOSEC) 40 MG capsule TAKE 1 CAPSULE BY MOUTH ONCE DAILY BEFORE BREAKFAST 90 capsule 0   ONETOUCH ULTRA test strip Check blood sugar 1 x daily as directed 100 each 12   Semaglutide, 1 MG/DOSE, (OZEMPIC, 1 MG/DOSE,) 2 MG/1.5ML SOPN INJECT 1 MG SUBCUTANEOUSLY ONCE A WEEK 4 mL 0   spironolactone (ALDACTONE) 25 MG tablet Take 1 tablet by mouth once daily 90 tablet 0   vitamin B-12 (CYANOCOBALAMIN) 1000 MCG tablet Take 1,000 mcg by mouth daily.      No current facility-administered medications for this visit.    Allergies  Allergen Reactions   Aspirin Hives    Other reaction(s): Other (See Comments) Other Reaction: Not Assessed    Insulin Detemir Hives and Rash    Errythema, edema, heat at site of injection did not improve after 2 weeks of use.     Shellfish Allergy Anaphylaxis   Dapagliflozin Other (See Comments)    Yeast infections Yeast infection Farxiga   Influenza Vaccines Other (See Comments)    Forxiga   Other     Other reaction(s): Other (See Comments) Uncoded Allergy. Allergen: Shellfish, Other Reaction: Not Assessed  Flu vaccine given 1998 caused anaphylaxis    Family History  Problem Relation Age of Onset   Diabetes Mother    Diabetes Father    Stroke Father    Diabetes Sister    Heart attack Other    Diabetes Sister    Diabetes Sister    Diabetes Sister     Social History   Socioeconomic History   Marital status: Married    Spouse name: Not on file   Number of children: Not on file   Years of education: Not on file   Highest education level: High school graduate  Occupational History   Occupation: housekeeper    Comment: self-employed  Tobacco Use   Smoking status: Never   Smokeless tobacco: Never  Vaping Use   Vaping Use: Never used  Substance and Sexual Activity   Alcohol use: Yes    Alcohol/week: 0.0 - 1.0 standard  drinks    Comment: occasional   Drug use: Never   Sexual activity: Yes    Birth control/protection: None  Other Topics Concern   Not on file  Social History Narrative   Not on file   Social Determinants of Health   Financial Resource Strain: Low Risk    Difficulty of Paying Living Expenses: Not hard at all  Food Insecurity: No Food Insecurity   Worried About Charity fundraiser in the Last Year: Never true   [place] in the Last Year: Never true  Transportation Needs: No Transportation Needs   Lack of Transportation (Medical): No   Lack of Transportation (Non-Medical): No  Physical Activity: Inactive   Days of Exercise per Week: 0 days   Minutes of Exercise per Session: 0 min  Stress: No Stress Concern Present   Feeling of Stress : Not at all  Social Connections: Moderately Isolated   Frequency of Communication with Friends and Family: More than three times a week   Frequency of Social Gatherings with Friends and Family: More than three times a week   Attends Religious Services: Never   Marine scientist or Organizations: No   Attends Music therapist: Never   Marital Status: Married  Human resources officer Violence: Not At Risk   Fear of Current or Ex-Partner: No   Emotionally Abused: No   Physically Abused: No   Sexually Abused: No     Constitutional: Denies fever, malaise, fatigue, headache or abrupt weight changes.  HEENT: Denies eye pain, eye redness, ear pain, ringing in the ears, wax buildup, runny nose, nasal congestion, bloody nose, or sore throat. Respiratory: Denies difficulty breathing, shortness of breath, cough or sputum production.   Cardiovascular: Denies chest pain, chest tightness, palpitations or swelling in the hands or feet.  Gastrointestinal: Denies abdominal pain, bloating, constipation, diarrhea or blood in the stool.  GU: Denies urgency, frequency, pain with urination, burning sensation, blood in urine, odor or  discharge. Musculoskeletal: Pt reports chronic joint pain. Denies decrease in range of motion, difficulty with gait, muscle pain or joint swelling.  Skin: Denies redness, rashes, lesions or ulcercations.  Neurological: Denies dizziness, difficulty with memory, difficulty with speech or problems with balance and coordination.  Psych: Denies anxiety, depression, SI/HI.  No other specific complaints in a complete review of systems (except as listed in HPI above).     Objective:   Physical Exam   There were no vitals taken for this visit. Wt Readings from Last 3 Encounters:  05/29/21 162 lb (73.5 kg)  03/15/21  159 lb 12.8 oz (72.5 kg)  12/17/20 161 lb 12.8 oz (73.4 kg)    General: Appears their stated age, well developed, well nourished in NAD. Skin: Warm, dry and intact. No rashes, lesions or ulcerations noted. HEENT: Head: normal shape and size; Eyes: sclera white, no icterus, conjunctiva pink, PERRLA and EOMs intact; Ears: Tm's gray and intact, normal light reflex; Nose: mucosa pink and moist, septum midline; Throat/Mouth: Teeth present, mucosa pink and moist, no exudate, lesions or ulcerations noted.  Neck:  Neck supple, trachea midline. No masses, lumps or thyromegaly present.  Cardiovascular: Normal rate and rhythm. S1,S2 noted.  No murmur, rubs or gallops noted. No JVD or BLE edema. No carotid bruits noted. Pulmonary/Chest: Normal effort and positive vesicular breath sounds. No respiratory distress. No wheezes, rales or ronchi noted.  Abdomen: Soft and nontender. Normal bowel sounds. No distention or masses noted. Liver, spleen and kidneys non palpable. Musculoskeletal: Normal range of motion. No signs of joint swelling. No difficulty with gait.  Neurological: Alert and oriented. Cranial nerves II-XII grossly intact. Coordination normal.  Psychiatric: Mood and affect normal. Behavior is normal. Judgment and thought content normal.     BMET    Component Value Date/Time   NA 141  05/29/2021 0832   K 5.0 05/29/2021 0832   CL 107 05/29/2021 0832   CO2 27 05/29/2021 0832   GLUCOSE 112 (H) 05/29/2021 0832   BUN 13 05/29/2021 0832   CREATININE 0.66 05/29/2021 0832   CALCIUM 9.3 05/29/2021 0832   GFRNONAA 79 06/10/2018 0824   GFRAA 91 06/10/2018 0824    Lipid Panel     Component Value Date/Time   CHOL 133 05/29/2021 0832   TRIG 130 05/29/2021 0832   HDL 50 05/29/2021 0832   CHOLHDL 2.7 05/29/2021 0832   LDLCALC 62 05/29/2021 0832    CBC    Component Value Date/Time   WBC 7.3 05/29/2021 0832   RBC 4.86 05/29/2021 0832   HGB 13.3 05/29/2021 0832   HCT 42.2 05/29/2021 0832   PLT 323 05/29/2021 0832   MCV 86.8 05/29/2021 0832   MCH 27.4 05/29/2021 0832   MCHC 31.5 (L) 05/29/2021 0832   RDW 13.2 05/29/2021 0832   LYMPHSABS 2,967 04/12/2019 0818   EOSABS 95 04/12/2019 0818   BASOSABS 34 04/12/2019 0818    Hgb A1C Lab Results  Component Value Date   HGBA1C 7.8 (H) 05/29/2021           Assessment & Plan:     Webb Silversmith, NP

## 2021-07-24 ENCOUNTER — Telehealth: Payer: Self-pay | Admitting: Pharmacist

## 2021-07-24 ENCOUNTER — Telehealth: Payer: PPO

## 2021-07-24 NOTE — Telephone Encounter (Signed)
  Chronic Care Management   Outreach Note  07/24/2021 Name: Waynesha Rammel MRN: [id number] DOB: Jan 24, 1951  Referred by: [name], NP Reason for referral : No chief complaint on file.   Outreach to patient today by telephone as scheduled. Note from review of chart, patient missed appointment with PCP scheduled for yesterday.   Today when reach patient by telephone, she has just arrived to the office as she understood appointment with PCP to have been scheduled for today. Patient reschedules appointment with office to see PCP on 6/8.  Reschedule appointment with CCM Pharmacist as requested  Follow Up Plan: Will outreach to patient by telephone again on 08/12/2021 at 8:30 AM  Wallace Cullens, PharmD, [place] [facility] [phone]

## 2021-07-25 ENCOUNTER — Ambulatory Visit (INDEPENDENT_AMBULATORY_CARE_PROVIDER_SITE_OTHER): Payer: PPO | Admitting: Internal Medicine

## 2021-07-25 ENCOUNTER — Encounter: Payer: Self-pay | Admitting: Internal Medicine

## 2021-07-25 VITALS — BP 124/82 | HR 90 | Temp 98.4°F | Wt 161.0 lb

## 2021-07-25 DIAGNOSIS — R42 Dizziness and giddiness: Secondary | ICD-10-CM

## 2021-07-25 DIAGNOSIS — G479 Sleep disorder, unspecified: Secondary | ICD-10-CM | POA: Diagnosis not present

## 2021-07-25 DIAGNOSIS — Z1231 Encounter for screening mammogram for malignant neoplasm of breast: Secondary | ICD-10-CM

## 2021-07-25 DIAGNOSIS — F439 Reaction to severe stress, unspecified: Secondary | ICD-10-CM | POA: Diagnosis not present

## 2021-07-25 MED ORDER — EPINEPHRINE 0.3 MG/0.3ML IJ SOAJ: 0.3000 mg | INTRAMUSCULAR | 1 refills | Status: DC | PRN

## 2021-07-25 MED ORDER — MECLIZINE HCL 25 MG PO TABS: 25.0000 mg | ORAL_TABLET | Freq: Three times a day (TID) | ORAL | 1 refills | Status: DC | PRN

## 2021-07-25 NOTE — Progress Notes (Signed)
Subjective:    Patient ID: Summer Young, female    DOB: July 27, 1950, 71 y.o.   MRN: [id number]  HPI  Pt presents to the clinic today with c/o dizziness. This started about 2 months ago, but worsened in the last 2 weeks. It seems worse in the morning. She describes it as a sensation that the room is spinning. She denies sinus symptoms, ear problems, chest pain, shortness of breath or syncope. She has not tried anything OTC for this. She does feel like she drinks adequate amounts of water. Pt has a history of vertigo around 2009.  She has been under a lot of stress lately, not sleeping well and is not sure if this is a contributing factor.  Review of Systems     Past Medical History:  Diagnosis Date   Allergy    Chronic pain of both knees    Diabetes mellitus without complication (HCC)    Hyperlipidemia    Hypertension     Current Outpatient Medications  Medication Sig Dispense Refill   acetaminophen (TYLENOL) 650 MG CR tablet Take 650 mg by mouth 2 (two) times daily as needed for pain.     alendronate (FOSAMAX) 70 MG tablet Take 1 tablet (70 mg total) by mouth once a week. Take with a full glass of water on an empty stomach. 12 tablet 1   Ascorbic Acid (VITAMIN C) 1000 MG tablet Take 1,000 mg by mouth daily as needed.      atorvastatin (LIPITOR) 40 MG tablet Take 1 tablet by mouth once daily 90 tablet 0   Biotin 1000 MCG tablet Take 4,000 mcg by mouth every other day.      blood glucose meter kit and supplies Dispense based on patient and insurance preference. Use up to four times daily as directed. (FOR ICD-10 E10.9, E11.9). 1 each 0   Cholecalciferol (VITAMIN D3) 50 MCG (2000 UT) capsule Take 1,000 Units by mouth daily.      insulin degludec (TRESIBA FLEXTOUCH) 100 UNIT/ML FlexTouch Pen Inject 26 Units into the skin daily. (Patient taking differently: Inject 20 Units into the skin daily.) 15 mL 0   metFORMIN (GLUCOPHAGE-XR) 500 MG 24 hr tablet TAKE 3 TABLETS BY MOUTH ONCE DAILY WITH  BREAKFAST 270 tablet 0   neomycin-polymyxin-hydrocortisone (CORTISPORIN) OTIC solution Apply 1-2 drops to toe after soaking 10 mL 0   omeprazole (PRILOSEC) 40 MG capsule TAKE 1 CAPSULE BY MOUTH ONCE DAILY BEFORE BREAKFAST 90 capsule 0   ONETOUCH ULTRA test strip Check blood sugar 1 x daily as directed 100 each 12   Semaglutide, 1 MG/DOSE, (OZEMPIC, 1 MG/DOSE,) 2 MG/1.5ML SOPN INJECT 1 MG SUBCUTANEOUSLY ONCE A WEEK 4 mL 0   spironolactone (ALDACTONE) 25 MG tablet Take 1 tablet by mouth once daily 90 tablet 0   vitamin B-12 (CYANOCOBALAMIN) 1000 MCG tablet Take 1,000 mcg by mouth daily.      No current facility-administered medications for this visit.    Allergies  Allergen Reactions   Aspirin Hives    Other reaction(s): Other (See Comments) Other Reaction: Not Assessed    Insulin Detemir Hives and Rash    Errythema, edema, heat at site of injection did not improve after 2 weeks of use.     Shellfish Allergy Anaphylaxis   Dapagliflozin Other (See Comments)    Yeast infections Yeast infection Farxiga   Influenza Vaccines Other (See Comments)    Forxiga   Other     Other reaction(s): Other (See Comments) Uncoded Allergy. Allergen:  Shellfish, Other Reaction: Not Assessed    Flu vaccine given 1998 caused anaphylaxis    Family History  Problem Relation Age of Onset   Diabetes Mother    Diabetes Father    Stroke Father    Diabetes Sister    Heart attack Other    Diabetes Sister    Diabetes Sister    Diabetes Sister     Social History   Socioeconomic History   Marital status: Married    Spouse name: Not on file   Number of children: Not on file   Years of education: Not on file   Highest education level: High school graduate  Occupational History   Occupation: housekeeper    Comment: self-employed  Tobacco Use   Smoking status: Never   Smokeless tobacco: Never  Vaping Use   Vaping Use: Never used  Substance and Sexual Activity   Alcohol use: Yes    Alcohol/week:  0.0 - 1.0 standard drinks of alcohol    Comment: occasional   Drug use: Never   Sexual activity: Yes    Birth control/protection: None  Other Topics Concern   Not on file  Social History Narrative   Not on file   Social Determinants of Health   Financial Resource Strain: Low Risk  (12/06/2020)   Overall Financial Resource Strain (CARDIA)    Difficulty of Paying Living Expenses: Not hard at all  Food Insecurity: No Food Insecurity (12/06/2020)   Hunger Vital Sign    Worried About Running Out of Food in the Last Year: Never true    Ran Out of Food in the Last Year: Never true  Transportation Needs: No Transportation Needs (12/06/2020)   PRAPARE - Hydrologist (Medical): No    Lack of Transportation (Non-Medical): No  Physical Activity: Inactive (12/06/2020)   Exercise Vital Sign    Days of Exercise per Week: 0 days    Minutes of Exercise per Session: 0 min  Stress: No Stress Concern Present (12/06/2020)   [place]    Feeling of Stress : Not at all  Social Connections: Moderately Isolated (12/06/2020)   Social Connection and Isolation Panel [NHANES]    Frequency of Communication with Friends and Family: More than three times a week    Frequency of Social Gatherings with Friends and Family: More than three times a week    Attends Religious Services: Never    Marine scientist or Organizations: No    Attends Archivist Meetings: Never    Marital Status: Married  Human resources officer Violence: Not At Risk (12/06/2020)   Humiliation, Afraid, Rape, and Kick questionnaire    Fear of Current or Ex-Partner: No    Emotionally Abused: No    Physically Abused: No    Sexually Abused: No     Constitutional: Denies fever, malaise, fatigue, headache or abrupt weight changes.  HEENT: Denies eye pain, eye redness, ear pain, ringing in the ears, wax buildup, runny nose, nasal  congestion, bloody nose, or sore throat. Respiratory: Denies difficulty breathing, shortness of breath, cough or sputum production.   Cardiovascular: Denies chest pain, chest tightness, palpitations or swelling in the hands or feet.  Musculoskeletal: Denies decrease in range of motion, difficulty with gait, muscle pain or joint pain and swelling.  Skin: Denies redness, rashes, lesions or ulcercations.  Neurological: Pt reports dizziness. Denies difficulty with memory, difficulty with speech or problems with balance and coordination.  No other specific complaints in a complete review of systems (except as listed in HPI above).  Objective:   Physical Exam  BP 124/82 (BP Location: Left Arm, Patient Position: Sitting, Cuff Size: Large)   Pulse 90   Temp 98.4 F (36.9 C) (Oral)   Wt 161 lb (73 kg)   SpO2 100%   BMI 29.45 kg/m   Wt Readings from Last 3 Encounters:  05/29/21 162 lb (73.5 kg)  03/15/21 159 lb 12.8 oz (72.5 kg)  12/17/20 161 lb 12.8 oz (73.4 kg)    General: Appears her stated age, overweight, in NAD. HEENT: Head: normal shape and size; Eyes: sclera white, no icterus, conjunctiva pink, PERRLA and EOMs intact; Ears: Tm's gray and intact, normal light reflex;  Neck:  Neck supple, trachea midline. No masses, lumps or thyromegaly present.  Cardiovascular: Normal rate and rhythm. S1,S2 noted.  No murmur, rubs or gallops noted.  No carotid bruits noted. Pulmonary/Chest: Normal effort and positive vesicular breath sounds. No respiratory distress. No wheezes, rales or ronchi noted.  Musculoskeletal: No difficulty with gait.  Neurological: Alert and oriented. Cranial nerves II-XII grossly intact.  Negative Romberg.  Coordination normal.   BMET    Component Value Date/Time   NA 141 05/29/2021 0832   K 5.0 05/29/2021 0832   CL 107 05/29/2021 0832   CO2 27 05/29/2021 0832   GLUCOSE 112 (H) 05/29/2021 0832   BUN 13 05/29/2021 0832   CREATININE 0.66 05/29/2021 0832   CALCIUM  9.3 05/29/2021 0832   GFRNONAA 79 06/10/2018 0824   GFRAA 91 06/10/2018 0824    Lipid Panel     Component Value Date/Time   CHOL 133 05/29/2021 0832   TRIG 130 05/29/2021 0832   HDL 50 05/29/2021 0832   CHOLHDL 2.7 05/29/2021 0832   LDLCALC 62 05/29/2021 0832    CBC    Component Value Date/Time   WBC 7.3 05/29/2021 0832   RBC 4.86 05/29/2021 0832   HGB 13.3 05/29/2021 0832   HCT 42.2 05/29/2021 0832   PLT 323 05/29/2021 0832   MCV 86.8 05/29/2021 0832   MCH 27.4 05/29/2021 0832   MCHC 31.5 (L) 05/29/2021 0832   RDW 13.2 05/29/2021 0832   LYMPHSABS 2,967 04/12/2019 0818   EOSABS 95 04/12/2019 0818   BASOSABS 34 04/12/2019 0818    Hgb A1C Lab Results  Component Value Date   HGBA1C 7.8 (H) 05/29/2021           Assessment & Plan:   Dizziness, Stress, Sleep Disturbance:  Orthostatics: negative Will repeat CBC, BMET and TSH Encouraged adequate water intake Change positions slowly RX for Meclizine 25 mg Q8H prn  We will follow-up after labs with further recommendation and treatment plan  RTC in 4 months for your annual exam Webb Silversmith, NP

## 2021-07-25 NOTE — Addendum Note (Signed)
Addended by: [name] on: 07/25/2021 03:53 PM   Modules accepted: Orders

## 2021-07-25 NOTE — Patient Instructions (Signed)

## 2021-07-26 LAB — BASIC METABOLIC PANEL WITH GFR
BUN: 12 mg/dL (ref 7–25)
CO2: 25 mmol/L (ref 20–32)
Calcium: 9.6 mg/dL (ref 8.6–10.4)
Chloride: 105 mmol/L (ref 98–110)
Creat: 0.76 mg/dL (ref 0.50–1.05)
Glucose, Bld: 132 mg/dL (ref 65–139)
Potassium: 4.2 mmol/L (ref 3.5–5.3)
Sodium: 141 mmol/L (ref 135–146)
eGFR: 85 mL/min/{1.73_m2} (ref >=60)

## 2021-07-26 LAB — TSH: TSH: 2.01 mIU/L (ref 0.40–4.50)

## 2021-07-26 LAB — CBC
HCT: 40.8 % (ref 35.0–45.0)
Hemoglobin: 13.3 g/dL (ref 11.7–15.5)
MCH: 27.5 pg (ref 27.0–33.0)
MCHC: 32.6 g/dL (ref 32.0–36.0)
MCV: 84.3 fL (ref 80.0–100.0)
MPV: 9.9 fL (ref 7.5–12.5)
Platelets: 372 10*3/uL (ref 140–400)
RBC: 4.84 10*6/uL (ref 3.80–5.10)
RDW: 13.5 % (ref 11.0–15.0)
WBC: 9.5 10*3/uL (ref 3.8–10.8)

## 2021-07-28 ENCOUNTER — Other Ambulatory Visit: Payer: Self-pay | Admitting: Internal Medicine

## 2021-07-28 DIAGNOSIS — I10 Essential (primary) hypertension: Secondary | ICD-10-CM

## 2021-07-29 ENCOUNTER — Ambulatory Visit (INDEPENDENT_AMBULATORY_CARE_PROVIDER_SITE_OTHER): Payer: PPO

## 2021-07-29 DIAGNOSIS — Z0000 Encounter for general adult medical examination without abnormal findings: Secondary | ICD-10-CM

## 2021-07-29 NOTE — Progress Notes (Signed)
Subjective:    I connected with  Val Riles on 07/29/21 by a audio enabled telemedicine application and verified that I am speaking with the correct person using two identifiers.  Patient Location: Home  Provider Location: Office/Clinic  I discussed the limitations of evaluation and management by telemedicine. The patient expressed understanding and agreed to proceed.   Summer Young is a 71 y.o. female who presents for Medicare Annual (Subsequent) preventive examination.  Review of Systems    Per HPI unless specifically indicated below        Objective:    Today's Vitals   07/29/21 0801  PainSc: 3    There is no height or weight on file to calculate BMI.     05/08/2020    2:06 PM 09/16/2018   11:28 AM 02/26/2018    3:35 PM 01/29/2018    9:17 AM 01/19/2018   12:19 PM  Advanced Directives  Does Patient Have a Medical Advance Directive? _0   Does patient want to make changes to medical advance directive?  No - Patient declined     Would patient like information on creating a medical advance directive?   No - Patient declined Yes (MAU/Ambulatory/Procedural Areas - Information given) Yes (MAU/Ambulatory/Procedural Areas - Information given)    Current Medications (verified) Outpatient Encounter Medications as of 07/29/2021  Medication Sig   acetaminophen (TYLENOL) 650 MG CR tablet Take 650 mg by mouth 2 (two) times daily as needed for pain.   alendronate (FOSAMAX) 70 MG tablet Take 1 tablet (70 mg total) by mouth once a week. Take with a full glass of water on an empty stomach.   Ascorbic Acid (VITAMIN C) 1000 MG tablet Take 1,000 mg by mouth daily as needed.    atorvastatin (LIPITOR) 40 MG tablet Take 1 tablet by mouth once daily   Biotin 1000 MCG tablet Take 4,000 mcg by mouth every other day.    blood glucose meter kit and supplies Dispense based on patient and insurance preference. Use up to four times daily as directed. (FOR ICD-10 E10.9, E11.9).    Cholecalciferol (VITAMIN D3) 50 MCG (2000 UT) capsule Take 1,000 Units by mouth daily.    EPINEPHrine 0.3 mg/0.3 mL IJ SOAJ injection Inject 0.3 mg into the muscle as needed for anaphylaxis.   insulin degludec (TRESIBA FLEXTOUCH) 100 UNIT/ML FlexTouch Pen Inject 26 Units into the skin daily. (Patient taking differently: Inject 20 Units into the skin daily.)   meclizine (ANTIVERT) 25 MG tablet Take 1 tablet (25 mg total) by mouth 3 (three) times daily as needed for dizziness.   metFORMIN (GLUCOPHAGE-XR) 500 MG 24 hr tablet TAKE 3 TABLETS BY MOUTH ONCE DAILY WITH BREAKFAST   omeprazole (PRILOSEC) 40 MG capsule TAKE 1 CAPSULE BY MOUTH ONCE DAILY BEFORE BREAKFAST   ONETOUCH ULTRA test strip Check blood sugar 1 x daily as directed   Semaglutide, 1 MG/DOSE, (OZEMPIC, 1 MG/DOSE,) 2 MG/1.5ML SOPN INJECT 1 MG SUBCUTANEOUSLY ONCE A WEEK   spironolactone (ALDACTONE) 25 MG tablet Take 1 tablet by mouth once daily   vitamin B-12 (CYANOCOBALAMIN) 1000 MCG tablet Take 1,000 mcg by mouth daily.    [DISCONTINUED] neomycin-polymyxin-hydrocortisone (CORTISPORIN) OTIC solution Apply 1-2 drops to toe after soaking (Patient not taking: Reported on 07/29/2021)   No facility-administered encounter medications on file as of 07/29/2021.    Allergies (verified) Aspirin, Insulin detemir, Shellfish allergy, Dapagliflozin, Influenza vaccines, and Other   History: Past Medical History:  Diagnosis Date   Allergy  Chronic pain of both knees    Diabetes mellitus without complication ([place])    Hyperlipidemia    Hypertension    Past Surgical History:  Procedure Laterality Date   ANKLE SURGERY Left 2009   DILATION AND CURETTAGE, DIAGNOSTIC / THERAPEUTIC  1998   miscarriage   KNEE SURGERY Left 26   ACL   Family History  Problem Relation Age of Onset   Diabetes Mother    Diabetes Father    Stroke Father    Diabetes Sister    Heart attack Other    Diabetes Sister    Diabetes Sister    Diabetes Sister     Social History   Socioeconomic History   Marital status: Married    Spouse name: Reynoso,Eduardo   Number of children: Not on file   Years of education: Not on file   Highest education level: High school graduate  Occupational History   Occupation: housekeeper    Comment: self-employed   Occupation: Self- Employed  Tobacco Use   Smoking status: Never   Smokeless tobacco: Never  Vaping Use   Vaping Use: Never used  Substance and Sexual Activity   Alcohol use: Yes    Alcohol/week: 0.0 - 1.0 standard drinks of alcohol    Comment: occasional   Drug use: Never   Sexual activity: Yes    Birth control/protection: None  Other Topics Concern   Not on file  Social History Narrative   Not on file   Social Determinants of Health   Financial Resource Strain: Low Risk  (07/29/2021)   Overall Financial Resource Strain (CARDIA)    Difficulty of Paying Living Expenses: Not very hard  Food Insecurity: No Food Insecurity (07/29/2021)   Hunger Vital Sign    Worried About Running Out of Food in the Last Year: Never true    Ran Out of Food in the Last Year: Never true  Transportation Needs: No Transportation Needs (12/06/2020)   PRAPARE - Hydrologist (Medical): No    Lack of Transportation (Non-Medical): No  Physical Activity: Insufficiently Active (07/29/2021)   Exercise Vital Sign    Days of Exercise per Week: 2 days    Minutes of Exercise per Session: 20 min  Stress: Stress Concern Present (07/29/2021)   [place]    Feeling of Stress : To some extent  Social Connections: Moderately Isolated (12/06/2020)   Social Connection and Isolation Panel [NHANES]    Frequency of Communication with Friends and Family: More than three times a week    Frequency of Social Gatherings with Friends and Family: More than three times a week    Attends Religious Services: Never    Marine scientist or  Organizations: No    Attends Music therapist: Never    Marital Status: Married    Tobacco Counseling Counseling given: Not Answered   Clinical Intake:  Pre-visit preparation completed: No  Pain : 0-10 Pain Score: 3  Pain Type: Chronic pain Pain Location: Back Pain Orientation: Lower Pain Descriptors / Indicators: Aching, Dull Pain Onset: More than a month ago Pain Frequency: Intermittent Pain Relieving Factors: Pain relieved after a hoot shower  Pain Relieving Factors: Pain relieved after a hoot shower  Nutritional Status: BMI 25 -29 Overweight Nutritional Risks: Nausea/ vomitting/ diarrhea  How often do you need to have someone help you when you read instructions, pamphlets, or other written materials from your doctor or pharmacy?:  1 - Never  Diabetic?Nutrition Risk Assessment:  Has the patient had any N/V/D within the last 2 months?  No  Does the patient have any non-healing wounds?  No  Has the patient had any unintentional weight loss or weight gain?  No   Diabetes:  Is the patient diabetic?  Yes  If diabetic, was a CBG obtained today?  No  Did the patient bring in their glucometer from home?  No  How often do you monitor your CBG's? Once daily .   Financial Strains and Diabetes Management:  Are you having any financial strains with the device, your supplies or your medication? No .  Does the patient want to be seen by Chronic Care Management for management of their diabetes?  No  Would the patient like to be referred to a Nutritionist or for Diabetic Management?  No   Diabetic Exams:  Diabetic Eye Exam: Completed 05/29/2021 Diabetic Foot Exam: Completed 08/27/2020  . Referral placed to podiatry 05/29/2021   Interpreter Needed?: No  Information entered by :: Donnie Mesa, [place]   Activities of Daily Living    07/29/2021    8:04 AM 05/29/2021    8:18 AM  In your present state of health, do you have any difficulty performing the following  activities:  Hearing? 0 0  Vision? 1 0  Difficulty concentrating or making decisions? 1 0  Walking or climbing stairs? 1 1  Comment chronic back and knee issue   Dressing or bathing? 0 0  Doing errands, shopping? 0 0    Patient Care Team: [name], NP as PCP - General (Internal Medicine) Curley Spice, Virl Diamond, [place] as Pharmacist Hall Busing Nobie Putnam, RN as Case Manager (General Practice) Rebekah Chesterfield, LCSW as Social Worker (Licensed Clinical Social Worker)  Indicate any recent [place] you may have received from other than [facility] providers in the past year (date may be approximate). No hospitalization in the past 12 months.    Assessment:   This is a routine wellness examination for [place].  Hearing/Vision screen No results found.  Dietary issues and exercise activities discussed: Current Exercise Habits: Structured exercise class, Time (Minutes): 20, Frequency (Times/Week): 2, Weekly Exercise (Minutes/Week): 40, Intensity: Mild, Exercise limited by: orthopedic condition(s) The patient is eating well. Has determined that even a little bit of rice makes her blood sugars increase. The patient states that she is going to eliminate rice altogether. Discussed emotional eating and monitoring for foods high in carbs   Goals Addressed   None   Depression Screen    07/29/2021    8:05 AM 05/29/2021    8:17 AM 03/15/2021    9:02 AM 12/17/2020    8:43 AM 12/06/2020   10:19 AM 05/16/2020   10:30 AM 05/08/2020    2:07 PM  PHQ 2/9 Scores  PHQ - 2 Score 1 1 0 0 0 0 0  PHQ- 9 Score _0 Fall Risk    07/29/2021    8:04 AM 05/29/2021    8:17 AM 03/15/2021    9:02 AM 05/08/2020    2:06 PM 09/19/2019    8:07 AM  Fall Risk   Falls in the past year? 0 0 0 0 0  Number falls in past yr: 0 0 0  0  Injury with Fall? 0 0 0  0  Risk for fall due to : No Fall Risks No Fall Risks No Fall Risks Medication side effect No  Fall Risks  Follow up Falls evaluation completed Falls  evaluation completed Falls evaluation completed Falls evaluation completed;Education provided;Falls prevention discussed Falls evaluation completed    FALL RISK PREVENTION PERTAINING TO THE HOME:  Any stairs in or around the home? Yes  If so, are there any without handrails? Yes  Home free of loose throw rugs in walkways, pet beds, electrical cords, etc? Yes  Adequate lighting in your home to reduce risk of falls? Yes   ASSISTIVE DEVICES UTILIZED TO PREVENT FALLS:  Life alert? No  Use of a cane, walker or w/c? No  Grab bars in the bathroom? Yes  Shower chair or bench in shower? Yes  Elevated toilet seat or a handicapped toilet? No    Cognitive Function:        07/29/2021    8:07 AM 05/08/2020    2:09 PM 09/16/2018   11:53 AM  6CIT Screen  What Year? 0 points 0 points 0 points  What month? 0 points 0 points 0 points  What time? 0 points 0 points 0 points  Count back from 20 0 points 0 points 0 points  Months in reverse 0 points 0 points 0 points  Repeat phrase 0 points 4 points 2 points  Total Score 0 points 4 points 2 points    Immunizations Immunization History  Administered Date(s) Administered   PFIZER(Purple Top)SARS-COV-2 Vaccination 04/21/2019, 05/12/2019, 12/17/2019   Pneumococcal Conjugate-13 11/18/2017   Pneumococcal Polysaccharide-23 09/20/2008, 10/17/2020   Tdap 04/23/2007, 07/16/2017   Zoster Recombinat (Shingrix) 10/17/2020   Zoster, Live 11/12/2015    TDAP status: Up to date  Flu Vaccine status: Up to date  Pneumococcal vaccine status: Up to date  Covid-19 vaccine status: Completed vaccines  Qualifies for Shingles Vaccine? Yes   Zostavax completed No   Shingrix Completed?: Yes  Screening Tests Health Maintenance  Topic Date Due   MAMMOGRAM  Never done   COVID-19 Vaccine (4 - Pfizer series) 02/11/2020   Zoster Vaccines- Shingrix (2 of 2) 12/12/2020   FOOT EXAM  08/27/2021   HEMOGLOBIN A1C  11/28/2021   URINE MICROALBUMIN  12/17/2021    OPHTHALMOLOGY EXAM  05/30/2022   COLONOSCOPY (Pts 45-41yr Insurance coverage will need to be confirmed)  03/06/2023   TETANUS/TDAP  07/17/2027   Pneumonia Vaccine 71 Years old  Completed   DEXA SCAN  Completed   Hepatitis C Screening  Completed   HPV VACCINES  Aged Out    Health Maintenance  Health Maintenance Due  Topic Date Due   MAMMOGRAM  Never done   COVID-19 Vaccine (4 - [employer]) 02/11/2020   Zoster Vaccines- Shingrix (2 of 2) 12/12/2020    Colorectal cancer screening: Type of screening: Colonoscopy. Completed 03/05/2018. Repeat every 5 years  Mammogram status: Ordered 07/25/21. Pt provided with contact info and advised to call to schedule appt.   DEXA Scan: Completed 03/03/2018  Lung Cancer Screening: (Low Dose CT Chest recommended if Age 69-80years, 30 pack-year currently smoking OR have quit w/in 15years.) does not qualify.   Lung Cancer Screening Referral: does not qualify   Additional Screening:  Hepatitis C Screening: does qualify; Completed 03/03/2018   Vision Screening: Recommended annual ophthalmology exams for early detection of glaucoma and other disorders of the eye. Is the patient up to date with their annual eye exam?  Yes  Who is the provider or what is the name of the office in which the patient attends annual eye exams? [facility]  If pt is not established  with a provider, would they like to be referred to a provider to establish care? No .   Dental Screening: Recommended annual dental exams for proper oral hygiene  Community Resource Referral / Chronic Care Management: CRR required this visit?  No   CCM required this visit?  No      Plan:     I have personally reviewed and noted the following in the patient's chart:   Medical and social history Use of alcohol, tobacco or illicit drugs  Current medications and supplements including opioid prescriptions.  Functional ability and status Nutritional status Physical activity Advanced  directives List of other physicians Hospitalizations, surgeries, and ER visits in previous 12 months Vitals Screenings to include cognitive, depression, and falls Referrals and appointments  In addition, I have reviewed and discussed with patient certain preventive protocols, quality metrics, and best practice recommendations. A written personalized care plan for preventive services as well as general preventive health recommendations were provided to patient.     Wilson Singer, [place]   07/29/2021   Nurse Notes: The pt is currently checking her blood sugar once daily. Her A1C which was checked two months ago increased from 7.5% to 7.8%. She is currently checking her blood sugar once daily but wants to know if she should be checking it more frequently.

## 2021-07-30 ENCOUNTER — Ambulatory Visit (INDEPENDENT_AMBULATORY_CARE_PROVIDER_SITE_OTHER): Payer: PPO | Admitting: Licensed Clinical Social Worker

## 2021-07-30 DIAGNOSIS — I10 Essential (primary) hypertension: Secondary | ICD-10-CM

## 2021-07-30 DIAGNOSIS — M810 Age-related osteoporosis without current pathological fracture: Secondary | ICD-10-CM

## 2021-07-30 DIAGNOSIS — E1165 Type 2 diabetes mellitus with hyperglycemia: Secondary | ICD-10-CM

## 2021-07-30 NOTE — Chronic Care Management (AMB) (Signed)
Chronic Care Management    Clinical Social Work Note  07/30/2021 Name: Summer Young MRN: [id number] DOB: February 02, 1951  Summer Young is a 71 y.o. year old female who is a primary care patient of [name], NP. The CCM team was consulted to assist the patient with chronic disease management and/or care coordination needs related to: Mental Health Counseling and Resources.   Engaged with patient by telephone for follow up visit in response to provider referral for social work chronic care management and care coordination services.   Consent to Services:  The patient was given information about Chronic Care Management services, agreed to services, and gave verbal consent prior to initiation of services.  Please see initial visit note for detailed documentation.   Patient agreed to services and consent obtained.   Assessment: Review of patient past medical history, allergies, medications, and health status, including review of relevant consultants reports was performed today as part of a comprehensive evaluation and provision of chronic care management and care coordination services.     SDOH (Social Determinants of Health) assessments and interventions performed:    Advanced Directives Status: Not addressed in this encounter.  CCM Care Plan  Allergies  Allergen Reactions   Aspirin Hives    Other reaction(s): Other (See Comments) Other Reaction: Not Assessed    Insulin Detemir Hives and Rash    Errythema, edema, heat at site of injection did not improve after 2 weeks of use.     Shellfish Allergy Anaphylaxis   Dapagliflozin Other (See Comments)    Yeast infections Yeast infection Farxiga   Influenza Vaccines Other (See Comments)    Forxiga   Other     Other reaction(s): Other (See Comments) Uncoded Allergy. Allergen: Shellfish, Other Reaction: Not Assessed    Flu vaccine given 1998 caused anaphylaxis    Outpatient Encounter Medications as of 07/30/2021  Medication Sig Note    acetaminophen (TYLENOL) 650 MG CR tablet Take 650 mg by mouth 2 (two) times daily as needed for pain.    alendronate (FOSAMAX) 70 MG tablet Take 1 tablet (70 mg total) by mouth once a week. Take with a full glass of water on an empty stomach.    Ascorbic Acid (VITAMIN C) 1000 MG tablet Take 1,000 mg by mouth daily as needed.  09/20/2018: As needed to prevent illness   atorvastatin (LIPITOR) 40 MG tablet Take 1 tablet by mouth once daily    Biotin 1000 MCG tablet Take 4,000 mcg by mouth every other day.  01/19/2018: Takes every other day   blood glucose meter kit and supplies Dispense based on patient and insurance preference. Use up to four times daily as directed. (FOR ICD-10 E10.9, E11.9).    Cholecalciferol (VITAMIN D3) 50 MCG (2000 UT) capsule Take 1,000 Units by mouth daily.     EPINEPHrine 0.3 mg/0.3 mL IJ SOAJ injection Inject 0.3 mg into the muscle as needed for anaphylaxis.    insulin degludec (TRESIBA FLEXTOUCH) 100 UNIT/ML FlexTouch Pen Inject 26 Units into the skin daily. (Patient taking differently: Inject 20 Units into the skin daily.)    meclizine (ANTIVERT) 25 MG tablet Take 1 tablet (25 mg total) by mouth 3 (three) times daily as needed for dizziness.    metFORMIN (GLUCOPHAGE-XR) 500 MG 24 hr tablet TAKE 3 TABLETS BY MOUTH ONCE DAILY WITH BREAKFAST    omeprazole (PRILOSEC) 40 MG capsule TAKE 1 CAPSULE BY MOUTH ONCE DAILY BEFORE BREAKFAST    ONETOUCH ULTRA test strip Check blood sugar 1  x daily as directed    Semaglutide, 1 MG/DOSE, (OZEMPIC, 1 MG/DOSE,) 2 MG/1.5ML SOPN INJECT 1 MG SUBCUTANEOUSLY ONCE A WEEK    spironolactone (ALDACTONE) 25 MG tablet Take 1 tablet by mouth once daily    vitamin B-12 (CYANOCOBALAMIN) 1000 MCG tablet Take 1,000 mcg by mouth daily.     No facility-administered encounter medications on file as of 07/30/2021.    Patient Active Problem List   Diagnosis Date Noted   GERD (gastroesophageal reflux disease) 05/29/2021   Mixed incontinence 12/17/2020    Overweight with body mass index (BMI) of 29 to 29.9 in adult 08/28/2020   Osteoporosis 08/16/2019   Chronic bilateral low back pain without sciatica 11/22/2014   Chronic pain of both knees 11/22/2014   Essential hypertension 11/22/2014   Dyslipidemia associated with type 2 diabetes mellitus ([place]) 11/22/2014   Type 2 diabetes mellitus with hyperglycemia ([place]) 01/19/2013    Conditions to be addressed/monitored: HTN, DMII, and Osteoporosis; Grief and Stress  Care Plan : General Social Work (Adult)  Updates made by Christa See D, LCSW since 07/30/2021 12:00 AM     Problem: Depression Identification (Depression)      Long-Range Goal: Depressive Symptoms Identified   Start Date: 01/24/2020  Expected End Date: 09/16/2021  This Visit's Progress: On track  Recent Progress: On track  Priority: Medium  Note:   Current Barriers:  Acute Mental Health needs related to anxiety/stress Mental Health Concerns  Suicidal Ideation/Homicidal Ideation: No Clinical Social Work Goal(s):  Over the next 120 days, patient will work with SW bi-monthly by telephone or in person to reduce or manage symptoms related to anxiety Over the next 120 days, patient will work with SW to address concerns related to gaining mental health support and develop better coping skills to manage symptoms Interventions: Patient interviewed and appropriate assessments performed Patient agreed to track episodes of dizziness to provide to PCP. States that episodes occur after taking morning medications Patient picked up medications and reports compliance with medications Patient successfully identified triggers to stress, in addition, to grief of multiple family members. Strategies discussed to assist patient with having realistic expectations and delegating tasks, as needed to promote decrease in stress Patient is looking forward to visiting [place] two weeks in [place] CCM LCSW and patient identified strategies to promote  stress management and self-care  Patient was successful in identifying healthy coping skills CCM LCSW discussed plans with patient for ongoing care management follow up and provided patient with direct contact information for care management team Solution-Focused Strategies, Active listening / Reflection utilized , Emotional Supportive Provided, Participation in counseling encouraged, and Verbalization of feelings encouraged  Patient Self Care Activities:  Attend all scheduled provider appointments Continue utilizing healthy coping skills and strategies on communication Contact clinic with any questions or concerns       Follow Up Plan: SW will follow up with patient by phone over the next 8 weeks      Christa See, MSW, [email or web address] Phone [phone] 1:59 PM

## 2021-07-30 NOTE — Telephone Encounter (Signed)
Requested Prescriptions  Pending Prescriptions Disp Refills  . spironolactone (ALDACTONE) 25 MG tablet [Pharmacy Med Name: Spironolactone 25 MG Oral Tablet] 90 tablet 0    Sig: Take 1 tablet by mouth once daily     Cardiovascular: Diuretics - Aldosterone Antagonist Passed - 07/28/2021  3:44 PM      Passed - Cr in normal range and within 180 days    Creat  Date Value Ref Range Status  07/25/2021 0.76 0.50 - 1.05 mg/dL Final   Creatinine, Urine  Date Value Ref Range Status  12/17/2020 51 20 - 275 mg/dL Final         Passed - K in normal range and within 180 days    Potassium  Date Value Ref Range Status  07/25/2021 4.2 3.5 - 5.3 mmol/L Final         Passed - Na in normal range and within 180 days    Sodium  Date Value Ref Range Status  07/25/2021 141 135 - 146 mmol/L Final         Passed - eGFR is 30 or above and within 180 days    GFR, Est African American  Date Value Ref Range Status  06/10/2018 91 > OR = 60 mL/min/1.73m2 Final   GFR, Est Non African American  Date Value Ref Range Status  06/10/2018 79 > OR = 60 mL/min/1.73m2 Final   eGFR  Date Value Ref Range Status  07/25/2021 85 > OR = 60 mL/min/1.73m2 Final    Comment:    The eGFR is based on the CKD-EPI 2021 equation. To calculate  the new eGFR from a previous Creatinine or Cystatin C result, go to https://www.kidney.org/professionals/ kdoqi/gfr%5Fcalculator          Passed - Last BP in normal range    BP Readings from Last 1 Encounters:  07/25/21 124/82         Passed - Valid encounter within last 6 months    Recent Outpatient Visits          5 days ago Dizziness   [facility] Baity, Regina W, NP   2 months ago Type 2 diabetes mellitus with hyperglycemia, without long-term current use of insulin (HCC)   [facility] Baity, Regina W, NP   4 months ago Gastroesophageal reflux disease without esophagitis   [facility] Karamalegos, Alexander J, DO   7  months ago Encounter for general adult medical examination with abnormal findings   [facility] Baity, Regina W, NP   11 months ago Bilateral hip pain   [facility] Baity, Regina W, NP      Future Appointments            In 4 months Baity, Regina W, NP [facility]            

## 2021-07-30 NOTE — Patient Instructions (Signed)
Visit Information  Thank you for taking time to visit with me today. Please don't hesitate to contact me if I can be of assistance to you before our next scheduled telephone appointment.  Following are the goals we discussed today:  Patient Self Care Activities:  Attend all scheduled provider appointments Continue utilizing healthy coping skills and strategies on communication Contact clinic with any questions or concerns  Our next appointment is by telephone on 10/15/21 at 3 PM  Please call the care guide team at [phone] if you need to cancel or reschedule your appointment.   If you are experiencing a Mental Health or [place] or need someone to talk to, please call the Suicide and Crisis Lifeline: 988 call 911   Patient verbalizes understanding of instructions and care plan provided today and agrees to view in [place]. Active MyChart status and patient understanding of how to access instructions and care plan via MyChart confirmed with patient.     Christa See, MSW, [facility] Care Management [email or web address] Phone [phone] 2:00 PM

## 2021-08-06 ENCOUNTER — Telehealth: Payer: Self-pay

## 2021-08-06 NOTE — Telephone Encounter (Signed)
Copied from [place] [id number]. Topic: Referral - Request for Referral >> Aug 06, 2021  9:40 AM Marcellus Scott wrote: Has patient seen PCP for this complaint? Yes.   *If NO, is insurance requiring patient see PCP for this issue before PCP can refer them? Referral for which specialty: N/A Preferred provider/office: [place] [facility] Reason for referral: Mammogram  Pt requesting a call back when this has been sent.

## 2021-08-06 NOTE — Telephone Encounter (Signed)
Order completed. Advised pt to call [place] to schedule.    Thanks,   -Mickel Baas

## 2021-08-12 ENCOUNTER — Ambulatory Visit: Payer: PPO | Admitting: Pharmacist

## 2021-08-12 DIAGNOSIS — E1165 Type 2 diabetes mellitus with hyperglycemia: Secondary | ICD-10-CM

## 2021-08-14 ENCOUNTER — Ambulatory Visit: Payer: PPO | Admitting: Pharmacist

## 2021-08-14 ENCOUNTER — Other Ambulatory Visit: Payer: Self-pay

## 2021-08-14 DIAGNOSIS — E1165 Type 2 diabetes mellitus with hyperglycemia: Secondary | ICD-10-CM

## 2021-08-14 MED ORDER — ONETOUCH DELICA LANCETS 33G MISC: 1 refills | Status: AC

## 2021-08-14 MED ORDER — ONETOUCH VERIO W/DEVICE KIT: PACK | 0 refills | Status: AC

## 2021-08-14 MED ORDER — FREESTYLE LIBRE 3 SENSOR MISC: 12 refills | Status: DC

## 2021-08-14 MED ORDER — ONETOUCH VERIO VI STRP
ORAL_STRIP | 1 refills | Status: DC
Start: 2021-08-14 — End: 2022-04-04

## 2021-08-14 NOTE — Progress Notes (Signed)
I think this is a duplicate message. Do you recall sending this in for patient?

## 2021-08-14 NOTE — Chronic Care Management (AMB) (Signed)
Chronic Care Management CCM Pharmacy Note  08/14/2021 Name:  Summer Young MRN:  [id number] DOB:  [birth year]   Subjective: Summer Young is an 71 y.o. year old female who is a primary patient of Lorre Munroe, NP.  The CCM team was consulted for assistance with disease management and care coordination needs.    Engaged with patient by telephone for follow up visit for pharmacy case management and/or care coordination services.   Objective:  Medications Reviewed Today     Reviewed by Manuela Neptune, RPH-CPP (Pharmacist) on 08/12/21 at 1558  Med List Status: <None>   Medication Order Taking? Sig Documenting Provider Last Dose Status Informant  acetaminophen (TYLENOL) 650 MG CR tablet [id number]  Take 650 mg by mouth 2 (two) times daily as needed for pain. [provider]  Active   alendronate (FOSAMAX) 70 MG tablet [id number]  Take 1 tablet (70 mg total) by mouth once a week. Take with a full glass of water on an empty stomach. Smitty Cords, DO  Active   Ascorbic Acid (VITAMIN C) 1000 MG tablet [id number]  Take 1,000 mg by mouth daily as needed.  [provider]  Active            Med Note De Nurse, [facility] A   Mon Sep 20, 2018  4:05 PM) As needed to prevent illness  atorvastatin (LIPITOR) 40 MG tablet [id number]  Take 1 tablet by mouth once daily Lorre Munroe, NP  Active   Biotin 1000 MCG tablet [id number]  Take 4,000 mcg by mouth every other day.  [provider]  Active Self           Med Note Bary Richard   Tue Jan 19, 2018 11:31 AM) Leanora Ivanoff every other day  blood glucose meter kit and supplies [id number]  Dispense based on patient and insurance preference. Use up to four times daily as directed. (FOR ICD-10 E10.9, E11.9). Tarri Fuller, FNP  Active   Cholecalciferol (VITAMIN D3) 50 MCG (2000 UT) capsule [id number]  Take 1,000 Units by mouth daily.  [provider]  Active   EPINEPHrine 0.3 mg/0.3 mL IJ SOAJ injection [id number]   Inject 0.3 mg into the muscle as needed for anaphylaxis. Lorre Munroe, NP  Active   insulin degludec (TRESIBA FLEXTOUCH) 100 UNIT/ML FlexTouch Pen [id number] Yes Inject 26 Units into the skin daily.  Patient taking differently: Inject 20 Units into the skin daily.   Lorre Munroe, NP Taking Active   meclizine (ANTIVERT) 25 MG tablet [id number]  Take 1 tablet (25 mg total) by mouth 3 (three) times daily as needed for dizziness. Lorre Munroe, NP  Active   metFORMIN (GLUCOPHAGE-XR) 500 MG 24 hr tablet [id number] Yes TAKE 3 TABLETS BY MOUTH ONCE DAILY WITH BREAKFAST Lorre Munroe, NP Taking Active   omeprazole (PRILOSEC) 40 MG capsule [id number]  TAKE 1 CAPSULE BY MOUTH ONCE DAILY BEFORE BREAKFAST Lorre Munroe, NP  Active   [facility] ULTRA test strip [id number]  Check blood sugar 1 x daily as directed Smitty Cords, DO  Active   Semaglutide, 1 MG/DOSE, (OZEMPIC, 1 MG/DOSE,) 2 MG/1.5ML SOPN [id number] Yes INJECT 1 MG SUBCUTANEOUSLY ONCE A WEEK Baity, Salvadore [place], NP Taking Active   spironolactone (ALDACTONE) 25 MG tablet [id number]  Take 1 tablet by mouth once daily Lorre Munroe, NP  Active   vitamin B-12 (CYANOCOBALAMIN) 1000 MCG tablet [id number]  Take 1,000 mcg by mouth daily.  [provider]  Active             Pertinent Labs:  Lab Results  Component Value Date   HGBA1C 7.8 (H) 05/29/2021   Lab Results  Component Value Date   CHOL 133 05/29/2021   HDL 50 05/29/2021   LDLCALC 62 05/29/2021   TRIG 130 05/29/2021   CHOLHDL 2.7 05/29/2021   Lab Results  Component Value Date   CREATININE 0.76 07/25/2021   BUN 12 07/25/2021   NA 141 07/25/2021   K 4.2 07/25/2021   CL 105 07/25/2021   CO2 25 07/25/2021    SDOH:  (Social Determinants of Health) assessments and interventions performed:    [place]  Review of patient past medical history, allergies, medications, health status, including review of consultants reports, laboratory and other test data, was  performed as part of comprehensive evaluation and provision of chronic care management services.   Care Plan : General Pharmacy (Adult)  Updates made by Rennis Petty, RPH-CPP since 08/14/2021 12:00 AM     Problem: Disease Progression      Long-Range Goal: Disease Progression Prevented or Minimized   Start Date: 01/23/2020  Expected End Date: 04/22/2020  Recent Progress: On track  Priority: High  Note:   Current Barriers:  Unable to independently afford treatment regimen Patient APPROVED for [name] and Ozempic medication assistance from [employer] for 2023 calendar year  Pharmacist Clinical Goal(s):  Over the next 90 days, patient will achieve control of T2DM as evidenced by A1C <7.0%. through collaboration with PharmD and provider.  Over the next 90 days, patient will maintain control of LDL as evidenced by LDL <70 mg/dL through collaboration with PharmD and provider.  Over the next 90 days, patient will verbalize ability to afford treatment regimen. through collaboration with PharmD and provider.  Interventions: Inter-disciplinary care team collaboration (see longitudinal plan of care) 1:1 collaboration with [name], NP regarding development and update of comprehensive plan of care as evidenced by provider attestation and co-signature  Diabetes: Current treatment: Tresiba 20 units once daily Metformin ER 500 mg - 3 tablets daily with breakfast Ozempic 1 mg once weekly Today follow up with [place]. [employer] RPh confirms received Rxs for One Touch Verio Flex and supplies as well as for [employer] 3 sensors Confirms Freestyle Libre 3 sensors covered at no charge to patient and in stock at pharmacy Reports will need to order glucometer for patient, but will be ready to pick up tomorrow afternoon Follow up with patient to provider this update Patient interested in trying Freestyle Libre 3 CGM Patient plans to use iPhone in place of reader device Discuss  options for training on continuous glucose monitor vs contact information for [employer] [phone]) for training on device/assistance at home Patient plans to setup at home using videos and support from [employer], but will let CM Pharmacist know if she has questions Send patient link for guide/videos on setting up [place] 3 and phone number for [place] link for patient to shared data with clinic via [place], once he has the App setup Patient to use data from CGM as feedback on dietary choices/habits to aid with improving glucose control   Patient Goals/Self-Care Activities Over the next 90 days, patient will:  Take medications as prescribed Check blood glucose daily, document, and provide at future appointments Keeps log in App on phone Collaborate with provider on medication access solutions Attends all scheduled provider appointments Calls pharmacy for medication  refills Calls provider office for new concerns or questions  Follow Up Plan: Telephone follow up appointment with care management team member scheduled for: 09/11/2021 at 8:30 am      Wallace Cullens, PharmD, [place] [phone]

## 2021-08-14 NOTE — Patient Instructions (Signed)
Visit Information  Thank you for taking time to visit with me today. Please don't hesitate to contact me if I can be of assistance to you before our next scheduled telephone appointment.  Following are the goals we discussed today:   Goals Addressed             This Visit's Progress    Pharmacy Goals       Our goal A1c is less than 7%. This corresponds with fasting sugars less than 130 and 2 hour after meal sugars less than 180. Please check your blood sugars and keep log of results  Our goal bad cholesterol, or LDL, is less than 70 . This is why it is important to continue taking your atorvastatin  Please check your home blood pressure, keep a log of the results and bring this with you to your medical appointments.  Feel free to call me with any questions or concerns. I look forward to our next call!   Wallace Cullens, PharmD, [place] [phone]          Our next appointment is by telephone on 09/11/2021 at 8:30 am  Please call the care guide team at [phone] if you need to cancel or reschedule your appointment.    Patient verbalizes understanding of instructions and care plan provided today and agrees to view in [place]. Active MyChart status and patient understanding of how to access instructions and care plan via MyChart confirmed with patient.

## 2021-08-16 DIAGNOSIS — E785 Hyperlipidemia, unspecified: Secondary | ICD-10-CM

## 2021-08-16 DIAGNOSIS — Z794 Long term (current) use of insulin: Secondary | ICD-10-CM

## 2021-08-16 DIAGNOSIS — E1169 Type 2 diabetes mellitus with other specified complication: Secondary | ICD-10-CM

## 2021-08-21 ENCOUNTER — Ambulatory Visit (INDEPENDENT_AMBULATORY_CARE_PROVIDER_SITE_OTHER): Payer: PPO | Admitting: Pharmacist

## 2021-08-21 DIAGNOSIS — E1165 Type 2 diabetes mellitus with hyperglycemia: Secondary | ICD-10-CM

## 2021-08-21 NOTE — Patient Instructions (Signed)
Visit Information  Thank you for taking time to visit with me today. Please don't hesitate to contact me if I can be of assistance to you before our next scheduled telephone appointment.  Following are the goals we discussed today:   Goals Addressed             This Visit's Progress    Pharmacy Goals       Our goal A1c is less than 7%. This corresponds with fasting sugars less than 130 and 2 hour after meal sugars less than 180. Please check your blood sugars and keep log of results  Our goal bad cholesterol, or LDL, is less than 70 . This is why it is important to continue taking your atorvastatin  Please check your home blood pressure, keep a log of the results and bring this with you to your medical appointments.  Feel free to call me with any questions or concerns. I look forward to our next call!  Wallace Cullens, PharmD, [place] [phone]          Our next appointment is by telephone on 09/11/2021 at 8:30 am  Please call the care guide team at [phone] if you need to cancel or reschedule your appointment.   Patient verbalizes understanding of instructions and care plan provided today and agrees to view in [place]. Active MyChart status and patient understanding of how to access instructions and care plan via MyChart confirmed with patient.

## 2021-08-21 NOTE — Chronic Care Management (AMB) (Signed)
Chronic Care Management CCM Pharmacy Note  08/21/2021 Name:  Summer Young MRN:  [id number] DOB:  11/11/1950   Subjective: Summer Young is an 71 y.o. year old female who is a primary patient of [name], NP.  The CCM team was consulted for assistance with disease management and care coordination needs.    Receive message from office advising patient contacted office requesting a call back. Return call to patient who requests assistance regarding her new Freestyle Libre 3 CGM  Engaged with patient by telephone for follow up visit for pharmacy case management and/or care coordination services.   Objective:  Medications     Reviewed by Rennis Petty, RPH-CPP (Pharmacist) on 08/12/21 at 39  Med List Status: <None>   Medication Order Taking? Sig Documenting Provider Last Dose Status Informant  acetaminophen (TYLENOL) 650 MG CR tablet [id number]  Take 650 mg by mouth 2 (two) times daily as needed for pain. [provider]  Active   alendronate (FOSAMAX) 70 MG tablet [id number]  Take 1 tablet (70 mg total) by mouth once a week. Take with a full glass of water on an empty stomach. Olin Hauser, DO  Active   Ascorbic Acid (VITAMIN C) 1000 MG tablet [id number]  Take 1,000 mg by mouth daily as needed.  [provider]  Active            Med Note Winfield Cunas, [facility] A   Mon Sep 20, 2018  4:05 PM) As needed to prevent illness  atorvastatin (LIPITOR) 40 MG tablet [id number]  Take 1 tablet by mouth once daily [name], NP  Active   Biotin 1000 MCG tablet [id number]  Take 4,000 mcg by mouth every other day.  [provider]  Active Self           Med Note Barrington Ellison   Tue Jan 19, 2018 11:31 AM) [name] every other day  blood glucose meter kit and supplies [id number]  Dispense based on patient and insurance preference. Use up to four times daily as directed. (FOR ICD-10 E10.9, E11.9). [name], FNP  Active   Cholecalciferol (VITAMIN D3) 50  MCG (2000 UT) capsule [id number]  Take 1,000 Units by mouth daily.  [provider]  Active   EPINEPHrine 0.3 mg/0.3 mL IJ SOAJ injection [id number]  Inject 0.3 mg into the muscle as needed for anaphylaxis. [name], NP  Active   insulin degludec (TRESIBA FLEXTOUCH) 100 UNIT/ML FlexTouch Pen [id number] Yes Inject 26 Units into the skin daily.  Patient taking differently: Inject 20 Units into the skin daily.   [name], NP Taking Active   meclizine (ANTIVERT) 25 MG tablet [id number]  Take 1 tablet (25 mg total) by mouth 3 (three) times daily as needed for dizziness. [name], NP  Active   metFORMIN (GLUCOPHAGE-XR) 500 MG 24 hr tablet [id number] Yes TAKE 3 TABLETS BY MOUTH ONCE DAILY WITH BREAKFAST [name], NP Taking Active   omeprazole (PRILOSEC) 40 MG capsule [id number]  TAKE 1 CAPSULE BY MOUTH ONCE DAILY BEFORE BREAKFAST [name], NP  Active   [facility] ULTRA test strip [id number]  Check blood sugar 1 x daily as directed Olin Hauser, DO  Active   Semaglutide, 1 MG/DOSE, (OZEMPIC, 1 MG/DOSE,) 2 MG/1.5ML [place] [facility] [id number] Yes INJECT 1 MG SUBCUTANEOUSLY ONCE A WEEK Baity, Coralie Keens, NP Taking Active   spironolactone (ALDACTONE) 25 MG tablet [id number]  Take 1 tablet by mouth once daily Baity,  Coralie Keens, NP  Active   vitamin B-12 (CYANOCOBALAMIN) 1000 MCG tablet [id number]  Take 1,000 mcg by mouth daily.  [provider]  Active             Pertinent Labs:  Lab Results  Component Value Date   HGBA1C 7.8 (H) 05/29/2021   Lab Results  Component Value Date   CHOL 133 05/29/2021   HDL 50 05/29/2021   LDLCALC 62 05/29/2021   TRIG 130 05/29/2021   CHOLHDL 2.7 05/29/2021   Lab Results  Component Value Date   CREATININE 0.76 07/25/2021   BUN 12 07/25/2021   NA 141 07/25/2021   K 4.2 07/25/2021   CL 105 07/25/2021   CO2 25 07/25/2021    SDOH:  (Social Determinants of Health) assessments and interventions performed:    [place]  Review of patient past medical history, allergies, medications, health status, including review of consultants reports, laboratory and other test data, was performed as part of comprehensive evaluation and provision of chronic care management services.   Care Plan : General Pharmacy (Adult)  Updates made by Rennis Petty, RPH-CPP since 08/21/2021 12:00 AM     Problem: Disease Progression      Long-Range Goal: Disease Progression Prevented or Minimized   Start Date: 01/23/2020  Expected End Date: 04/22/2020  Recent Progress: On track  Priority: High  Note:   Current Barriers:  Unable to independently afford treatment regimen Patient APPROVED for [name] and Ozempic medication assistance from [employer] for 2023 calendar year  Pharmacist Clinical Goal(s):  Over the next 90 days, patient will achieve control of T2DM as evidenced by A1C <7.0%. through collaboration with PharmD and provider.  Over the next 90 days, patient will maintain control of LDL as evidenced by LDL <70 mg/dL through collaboration with PharmD and provider.  Over the next 90 days, patient will verbalize ability to afford treatment regimen. through collaboration with PharmD and provider.  Interventions: Inter-disciplinary care team collaboration (see longitudinal plan of care) 1:1 collaboration with [name], NP regarding development and update of comprehensive plan of care as evidenced by provider attestation and co-signature Receive message from office advising patient contacted office requesting a call back. Return call to patient who requests assistance regarding her new Freestyle Libre 3 CGM Assist patient with linking Freestyle Libre 3 App to share data with CM Pharmacist/clinic via [employer] patient on using CGM as feedback on dietary choices/habits to aid with improving glucose control Patient started using CGM yesterday. Review limited data available with patient and discuss how to use  this data for feedback Patient expresses enthusiasm regarding using this tool to improve blood sugar control Patient denies further questions regarding medications or CGM today   Patient Goals/Self-Care Activities Over the next 90 days, patient will:  Take medications as prescribed Check blood glucose daily, document, and provide at future appointments Keeps log in App on phone Collaborate with provider on medication access solutions Attends all scheduled provider appointments Calls pharmacy for medication refills Calls provider office for new concerns or questions  Follow Up Plan: Telephone follow up appointment with care management team member scheduled for: 09/11/2021 at 8:30 am      Wallace Cullens, PharmD, [place] [phone]

## 2021-08-26 ENCOUNTER — Ambulatory Visit: Payer: PPO | Admitting: Pharmacist

## 2021-08-26 DIAGNOSIS — E1165 Type 2 diabetes mellitus with hyperglycemia: Secondary | ICD-10-CM

## 2021-08-26 NOTE — Patient Instructions (Signed)
Visit Information  Thank you for taking time to visit with me today. Please don't hesitate to contact me if I can be of assistance to you before our next scheduled telephone appointment.   Goals Addressed             This Visit's Progress    Pharmacy Goals       Our goal A1c is less than 7%. This corresponds with fasting sugars less than 130 and 2 hour after meal sugars less than 180. Please check your blood sugars and keep log of results  Our goal bad cholesterol, or LDL, is less than 70 . This is why it is important to continue taking your atorvastatin  Please check your home blood pressure, keep a log of the results and bring this with you to your medical appointments.  Feel free to call me with any questions or concerns. I look forward to our next call!   Wallace Cullens, PharmD, [place] [phone]          Our next appointment is by telephone on 09/11/2021 at 8:30 am  Please call the care guide team at [phone] if you need to cancel or reschedule your appointment.    Patient verbalizes understanding of instructions and care plan provided today and agrees to view in [place]. Active MyChart status and patient understanding of how to access instructions and care plan via MyChart confirmed with patient.

## 2021-08-26 NOTE — Chronic Care Management (AMB) (Signed)
Chronic Care Management CCM Pharmacy Note  08/26/2021 Name:  Summer Young MRN:  [id number] DOB:  December 02, 1950  Subjective: Summer Young is an 71 y.o. year old female who is a primary patient of [name], NP.  The CCM team was consulted for assistance with disease management and care coordination needs.    Engaged with patient by telephone for follow up visit for pharmacy case management and/or care coordination services.   Objective:  Medications Reviewed Today     Reviewed by Rennis Petty, RPH-CPP (Pharmacist) on 08/12/21 at 17  Med List Status: <None>   Medication Order Taking? Sig Documenting Provider Last Dose Status Informant  acetaminophen (TYLENOL) 650 MG CR tablet [id number]  Take 650 mg by mouth 2 (two) times daily as needed for pain. [provider]  Active   alendronate (FOSAMAX) 70 MG tablet [id number]  Take 1 tablet (70 mg total) by mouth once a week. Take with a full glass of water on an empty stomach. Olin Hauser, DO  Active   Ascorbic Acid (VITAMIN C) 1000 MG tablet [id number]  Take 1,000 mg by mouth daily as needed.  [provider]  Active            Med Note Winfield Cunas, [facility] A   Mon Sep 20, 2018  4:05 PM) As needed to prevent illness  atorvastatin (LIPITOR) 40 MG tablet [id number]  Take 1 tablet by mouth once daily [name], NP  Active   Biotin 1000 MCG tablet [id number]  Take 4,000 mcg by mouth every other day.  [provider]  Active Self           Med Note Barrington Ellison   Tue Jan 19, 2018 11:31 AM) [name] every other day  blood glucose meter kit and supplies [id number]  Dispense based on patient and insurance preference. Use up to four times daily as directed. (FOR ICD-10 E10.9, E11.9). [name], FNP  Active   Cholecalciferol (VITAMIN D3) 50 MCG (2000 UT) capsule [id number]  Take 1,000 Units by mouth daily.  [provider]  Active   EPINEPHrine 0.3 mg/0.3 mL IJ SOAJ injection [id number]   Inject 0.3 mg into the muscle as needed for anaphylaxis. [name], NP  Active   insulin degludec (TRESIBA FLEXTOUCH) 100 UNIT/ML FlexTouch Pen [id number] Yes Inject 26 Units into the skin daily.  Patient taking differently: Inject 20 Units into the skin daily.   [name], NP Taking Active   meclizine (ANTIVERT) 25 MG tablet [id number]  Take 1 tablet (25 mg total) by mouth 3 (three) times daily as needed for dizziness. [name], NP  Active   metFORMIN (GLUCOPHAGE-XR) 500 MG 24 hr tablet [id number] Yes TAKE 3 TABLETS BY MOUTH ONCE DAILY WITH BREAKFAST [name], NP Taking Active   omeprazole (PRILOSEC) 40 MG capsule [id number]  TAKE 1 CAPSULE BY MOUTH ONCE DAILY BEFORE BREAKFAST [name], NP  Active   [facility] ULTRA test strip [id number]  Check blood sugar 1 x daily as directed Olin Hauser, DO  Active   Semaglutide, 1 MG/DOSE, (OZEMPIC, 1 MG/DOSE,) 2 MG/1.5ML SOPN [id number] Yes INJECT 1 MG SUBCUTANEOUSLY ONCE A WEEK Baity, Coralie Keens, NP Taking Active   spironolactone (ALDACTONE) 25 MG tablet [id number]  Take 1 tablet by mouth once daily [name], NP  Active   vitamin B-12 (CYANOCOBALAMIN) 1000 MCG tablet [id number]  Take 1,000 mcg by mouth daily.  [provider]  Active             Pertinent Labs:  Lab Results  Component Value Date   HGBA1C 7.8 (H) 05/29/2021   Lab Results  Component Value Date   CHOL 133 05/29/2021   HDL 50 05/29/2021   LDLCALC 62 05/29/2021   TRIG 130 05/29/2021   CHOLHDL 2.7 05/29/2021   Lab Results  Component Value Date   CREATININE 0.76 07/25/2021   BUN 12 07/25/2021   NA 141 07/25/2021   K 4.2 07/25/2021   CL 105 07/25/2021   CO2 25 07/25/2021    SDOH:  (Social Determinants of Health) assessments and interventions performed:    [place]  Review of patient past medical history, allergies, medications, health status, including review of consultants reports, laboratory and other test data, was  performed as part of comprehensive evaluation and provision of chronic care management services.   Care Plan : General Pharmacy (Adult)  Updates made by Rennis Petty, RPH-CPP since 08/26/2021 12:00 AM     Problem: Disease Progression      Long-Range Goal: Disease Progression Prevented or Minimized   Start Date: 01/23/2020  Expected End Date: 04/22/2020  Recent Progress: On track  Priority: High  Note:   Current Barriers:  Unable to independently afford treatment regimen Patient APPROVED for [name] and Ozempic medication assistance from [employer] for 2023 calendar year  Pharmacist Clinical Goal(s):  Over the next 90 days, patient will achieve control of T2DM as evidenced by A1C <7.0%. through collaboration with PharmD and provider.  Over the next 90 days, patient will maintain control of LDL as evidenced by LDL <70 mg/dL through collaboration with PharmD and provider.  Over the next 90 days, patient will verbalize ability to afford treatment regimen. through collaboration with PharmD and provider.  Interventions: Inter-disciplinary care team collaboration (see longitudinal plan of care) 1:1 collaboration with [name], NP regarding development and update of comprehensive plan of care as evidenced by provider attestation and co-signature Receive a call from patient requesting a call back regarding her T2DM/CGM. Patient requesting feedback on her CGM results  Diabetes: Current treatment: Tresiba 20 units once daily Metformin ER 500 mg - 3 tablets daily with breakfast Ozempic 1 mg once weekly Patient using Freestyle Libre 3 continuous blood glucose monitor Using her iPhone in place of reader devices Review results from [place]: Note patient does not yet have 14 days of data    Congratulate patient on use of CGM as feedback on dietary choices/habits to aid with improving glucose control Notes has identified particular carbohydrates that were impacting  her blood sugar, cut back on portion sizes of these and is making sure to include protein in her meals  Patient Goals/Self-Care Activities Over the next 90 days, patient will:  Take medications as prescribed Check blood glucose daily, document, and provide at future appointments Keeps log in App on phone Collaborate with provider on medication access solutions Attends all scheduled provider appointments Calls pharmacy for medication refills Calls provider office for new concerns or questions  Follow Up Plan: Telephone follow up appointment with care management team member scheduled for: 09/11/2021 at 8:30 am      Wallace Cullens, PharmD, [place] [phone]

## 2021-08-30 ENCOUNTER — Ambulatory Visit
Admission: RE | Admit: 2021-08-30 | Discharge: 2021-08-30 | Disposition: A | Discharge status: Home / Self Care | Payer: PPO | Source: Ambulatory Visit | Attending: Internal Medicine | Admitting: Internal Medicine

## 2021-08-30 ENCOUNTER — Other Ambulatory Visit: Payer: Self-pay | Admitting: Internal Medicine

## 2021-08-30 ENCOUNTER — Inpatient Hospital Stay
Admission: RE | Admit: 2021-08-30 | Discharge: 2021-08-30 | Disposition: A | Discharge status: Home / Self Care | Payer: Self-pay | Source: Ambulatory Visit | Attending: *Deleted | Admitting: *Deleted

## 2021-08-30 ENCOUNTER — Other Ambulatory Visit: Payer: Self-pay | Admitting: *Deleted

## 2021-08-30 DIAGNOSIS — Z1231 Encounter for screening mammogram for malignant neoplasm of breast: Secondary | ICD-10-CM | POA: Diagnosis present

## 2021-08-30 DIAGNOSIS — I10 Essential (primary) hypertension: Secondary | ICD-10-CM

## 2021-08-30 DIAGNOSIS — E1169 Type 2 diabetes mellitus with other specified complication: Secondary | ICD-10-CM

## 2021-08-30 DIAGNOSIS — E1165 Type 2 diabetes mellitus with hyperglycemia: Secondary | ICD-10-CM

## 2021-08-30 NOTE — Telephone Encounter (Signed)
Requested Prescriptions  Pending Prescriptions Disp Refills  . spironolactone (ALDACTONE) 25 MG tablet [Pharmacy Med Name: Spironolactone 25 MG Oral Tablet] 90 tablet 0    Sig: Take 1 tablet by mouth once daily     Cardiovascular: Diuretics - Aldosterone Antagonist Passed - 08/30/2021  9:19 AM      Passed - Cr in normal range and within 180 days    Creat  Date Value Ref Range Status  07/25/2021 0.76 0.50 - 1.05 mg/dL Final   Creatinine, Urine  Date Value Ref Range Status  12/17/2020 51 20 - 275 mg/dL Final         Passed - K in normal range and within 180 days    Potassium  Date Value Ref Range Status  07/25/2021 4.2 3.5 - 5.3 mmol/L Final         Passed - Na in normal range and within 180 days    Sodium  Date Value Ref Range Status  07/25/2021 141 135 - 146 mmol/L Final         Passed - eGFR is 30 or above and within 180 days    GFR, Est African American  Date Value Ref Range Status  06/10/2018 91 > OR = 60 mL/min/1.73m2 Final   GFR, Est Non African American  Date Value Ref Range Status  06/10/2018 79 > OR = 60 mL/min/1.68m2 Final   eGFR  Date Value Ref Range Status  07/25/2021 85 > OR = 60 mL/min/1.67m2 Final    Comment:    The eGFR is based on the CKD-EPI 2021 equation. To calculate  the new eGFR from a previous Creatinine or Cystatin C result, go to https://www.kidney.org/professionals/ kdoqi/gfr%5Fcalculator          Passed - Last BP in normal range    BP Readings from Last 1 Encounters:  07/25/21 124/82         Passed - Valid encounter within last 6 months    Recent Outpatient Visits          1 month ago [facility] [place], [place] W, NP   3 months ago Type 2 diabetes mellitus with hyperglycemia, without long-term current use of insulin [facility])   [facility] [place], Coralie Keens, NP   5 months ago Gastroesophageal reflux disease without esophagitis   [place], DO   8  months ago Encounter for general adult medical examination with abnormal findings   [facility], Coralie Keens, NP   1 year ago Bilateral hip pain   [facility] [place], Coralie Keens, NP      Future Appointments            In 3 months [place], Coralie Keens, NP [facility], [facility]           . metFORMIN (GLUCOPHAGE-XR) 500 MG 24 hr tablet [Pharmacy Med Name: metFORMIN HCl ER 500 MG Oral Tablet Extended Release 24 Hour] 270 tablet 0    Sig: TAKE 3 TABLETS BY [place]     Endocrinology:  Diabetes - Biguanides Failed - 08/30/2021  9:19 AM      Failed - B12 Level in normal range and within 720 days    No results found for: "VITAMINB12"       Failed - CBC within normal limits and completed in the last 12 months    WBC  Date Value Ref Range Status  07/25/2021 9.5 3.8 - 10.8 Thousand/uL Final   RBC  Date Value Ref Range Status  07/25/2021 4.84 3.80 - 5.10 Million/uL Final   Hemoglobin  Date Value Ref Range Status  07/25/2021 13.3 11.7 - 15.5 g/dL Final   HCT  Date Value Ref Range Status  07/25/2021 40.8 35.0 - 45.0 % Final   MCHC  Date Value Ref Range Status  07/25/2021 32.6 32.0 - 36.0 g/dL Final   [facility]  Date Value Ref Range Status  07/25/2021 27.5 27.0 - 33.0 pg Final   MCV  Date Value Ref Range Status  07/25/2021 84.3 80.0 - 100.0 fL Final   No results found for: "PLTCOUNTKUC", "LABPLAT", "POCPLA" RDW  Date Value Ref Range Status  07/25/2021 13.5 11.0 - 15.0 % Final         Passed - Cr in normal range and within 360 days    Creat  Date Value Ref Range Status  07/25/2021 0.76 0.50 - 1.05 mg/dL Final   Creatinine, Urine  Date Value Ref Range Status  12/17/2020 51 20 - 275 mg/dL Final         Passed - HBA1C is between 0 and 7.9 and within 180 days    Hgb A1c MFr Bld  Date Value Ref Range Status  05/29/2021 7.8 (H) <5.7 % of total Hgb Final    Comment:    For someone without known diabetes, a  hemoglobin A1c value of 6.5% or greater indicates that they may have  diabetes and this should be confirmed with a follow-up  test. . For someone with known diabetes, a value <7% indicates  that their diabetes is well controlled and a value  greater than or equal to 7% indicates suboptimal  control. A1c targets should be individualized based on  duration of diabetes, age, comorbid conditions, and  other considerations. . Currently, no consensus exists regarding use of hemoglobin A1c for diagnosis of diabetes for children. .          Passed - eGFR in normal range and within 360 days    GFR, Est African American  Date Value Ref Range Status  06/10/2018 91 > OR = 60 mL/min/1.47m2 Final   GFR, Est Non African American  Date Value Ref Range Status  06/10/2018 79 > OR = 60 mL/min/1.61m2 Final   eGFR  Date Value Ref Range Status  07/25/2021 85 > OR = 60 mL/min/1.63m2 Final    Comment:    The eGFR is based on the CKD-EPI 2021 equation. To calculate  the new eGFR from a previous Creatinine or Cystatin C result, go to https://www.kidney.org/professionals/ kdoqi/gfr%5Fcalculator          Passed - Valid encounter within last 6 months    Recent Outpatient Visits          1 month ago [facility] [place], [place] W, NP   3 months ago Type 2 diabetes mellitus with hyperglycemia, without long-term current use of insulin [facility])   [facility] [place], Coralie Keens, NP   5 months ago Gastroesophageal reflux disease without esophagitis   [place], DO   8 months ago Encounter for general adult medical examination with abnormal findings   [place], Coralie Keens, NP   1 year ago Bilateral hip pain   [facility] [place], Coralie Keens, NP      Future Appointments  In 3 months Baity, Coralie Keens, NP [facility] [place], [place]           . atorvastatin (LIPITOR) 40  MG tablet [Pharmacy Med Name: Atorvastatin Calcium 40 MG Oral Tablet] 90 tablet 0    Sig: Take 1 tablet by mouth once daily     Cardiovascular:  Antilipid - Statins Failed - 08/30/2021  9:19 AM      Failed - Lipid Panel in normal range within the last 12 months    Cholesterol  Date Value Ref Range Status  05/29/2021 133 <200 mg/dL Final   LDL Cholesterol (Calc)  Date Value Ref Range Status  05/29/2021 62 mg/dL (calc) Final    Comment:    Reference range: <100 . Desirable range <100 mg/dL for primary prevention;   <70 mg/dL for patients with CHD or diabetic patients  with > or = 2 CHD risk factors. Marland Kitchen LDL-C is now calculated using the Martin-Hopkins  calculation, which is a validated novel method providing  better accuracy than the Friedewald equation in the  estimation of LDL-C.  Cresenciano Genre et al. Annamaria Helling. 6294;765(46): 2061-2068  (http://education.[employer].com/faq/FAQ164)    HDL  Date Value Ref Range Status  05/29/2021 50 > OR = 50 mg/dL Final   Triglycerides  Date Value Ref Range Status  05/29/2021 130 <150 mg/dL Final         Passed - Patient is not pregnant      Passed - Valid encounter within last 12 months    Recent Outpatient Visits          1 month ago [facility] [place], [place] W, NP   3 months ago Type 2 diabetes mellitus with hyperglycemia, without long-term current use of insulin [facility])   [facility] [place], Coralie Keens, NP   5 months ago Gastroesophageal reflux disease without esophagitis   [place], DO   8 months ago Encounter for general adult medical examination with abnormal findings   [facility] [place], Coralie Keens, NP   1 year ago Bilateral hip pain   [facility] [place], Coralie Keens, NP      Future Appointments            In 3 months Baity, Coralie Keens, NP [facility], [facility]

## 2021-09-11 ENCOUNTER — Ambulatory Visit: Payer: PPO | Admitting: Pharmacist

## 2021-09-11 ENCOUNTER — Telehealth: Payer: PPO

## 2021-09-11 DIAGNOSIS — E1165 Type 2 diabetes mellitus with hyperglycemia: Secondary | ICD-10-CM

## 2021-09-11 NOTE — Chronic Care Management (AMB) (Signed)
Chronic Care Management CCM Pharmacy Note  09/11/2021 Name:  Summer Young MRN:  [id number] DOB:  Aug 01, 1950   Subjective: Summer Young is an 71 y.o. year old female who is a primary patient of Lorre Munroe, NP.  The CCM team was consulted for assistance with disease management and care coordination needs.    Engaged with patient by telephone for follow up visit for pharmacy case management and/or care coordination services.   Objective:  Medications Reviewed Today     Reviewed by Manuela Neptune, RPH-CPP (Pharmacist) on 09/11/21 at 0900  Med List Status: <None>   Medication Order Taking? Sig Documenting Provider Last Dose Status Informant  acetaminophen (TYLENOL) 650 MG CR tablet [id number]  Take 650 mg by mouth 2 (two) times daily as needed for pain. [provider]  Active   alendronate (FOSAMAX) 70 MG tablet [id number] Yes Take 1 tablet (70 mg total) by mouth once a week. Take with a full glass of water on an empty stomach. Smitty Cords, DO Taking Active   Ascorbic Acid (VITAMIN C) 1000 MG tablet [id number] No Take 1,000 mg by mouth daily as needed.   Patient not taking: Reported on 09/11/2021   [provider] Not Taking Active            Med Note De Nurse, [facility] A   Mon Sep 20, 2018  4:05 PM) As needed to prevent illness  atorvastatin (LIPITOR) 40 MG tablet [id number] Yes Take 1 tablet by mouth once daily Lorre Munroe, NP Taking Active   Biotin 1000 MCG tablet [id number]  Take 4,000 mcg by mouth every other day.  [provider]  Active Self           Med Note Bary Richard   Tue Jan 19, 2018 11:31 AM) Leanora Ivanoff every other day  blood glucose meter kit and supplies [id number]  Dispense based on patient and insurance preference. Use up to four times daily as directed. (FOR ICD-10 E10.9, E11.9). Tarri Fuller, FNP  Active   Blood Glucose Monitoring Suppl [facility] VERIO) w/Device KIT [id number]  Use to check blood sugar as directed.  DX:  E11.65 Lorre Munroe, NP  Active   Cholecalciferol (VITAMIN D3) 50 MCG (2000 UT) capsule [id number]  Take 1,000 Units by mouth daily.  [provider]  Active   Continuous Blood Gluc Sensor (FREESTYLE LIBRE 3 SENSOR) [place] [id number]  Place 1 sensor on the skin every 14 days DX: E11.65 Lorre Munroe, NP  Active   EPINEPHrine 0.3 mg/0.3 mL IJ SOAJ injection [id number]  Inject 0.3 mg into the muscle as needed for anaphylaxis. Lorre Munroe, NP  Active   glucose blood [facility] VERIO) test strip [id number]  Use to check blood sugar as directed  DX: E11.65 Lorre Munroe, NP  Active   insulin degludec (TRESIBA FLEXTOUCH) 100 UNIT/ML FlexTouch Pen [id number] Yes Inject 26 Units into the skin daily.  Patient taking differently: Inject 20 Units into the skin daily.   Lorre Munroe, NP Taking Active   meclizine (ANTIVERT) 25 MG tablet [id number] No Take 1 tablet (25 mg total) by mouth 3 (three) times daily as needed for dizziness.  Patient not taking: Reported on 09/11/2021   Lorre Munroe, NP Not Taking Active   metFORMIN (GLUCOPHAGE-XR) 500 MG 24 hr tablet [id number] Yes TAKE 3 TABLETS BY MOUTH ONCE DAILY WITH BREAKFAST Lorre Munroe, NP Taking Active   omeprazole (PRILOSEC) 40 MG capsule [id number] Yes TAKE  1 CAPSULE BY MOUTH ONCE DAILY BEFORE BREAKFAST  Patient taking differently: Take 40 mg by mouth daily as needed.   [name], NP Taking Active   OneTouch Delica Lancets 93[address] [place] [id number]  Use to check blood sugar as directed.  DX: E11.65 [name], NP  Active   [facility] ULTRA test strip [id number]  Check blood sugar 1 x daily as directed Olin Hauser, DO  Active   Semaglutide, 1 MG/DOSE, (OZEMPIC, 1 MG/DOSE,) 2 MG/1.5ML SOPN [id number] Yes INJECT 1 MG SUBCUTANEOUSLY ONCE A WEEK Baity, Coralie Keens, NP Taking Active   spironolactone (ALDACTONE) 25 MG tablet [id number] Yes Take 1 tablet by mouth once daily [name], NP Taking Active   vitamin B-12 (CYANOCOBALAMIN)  1000 MCG tablet [id number]  Take 1,000 mcg by mouth daily.  [provider]  Active             Pertinent Labs:  Lab Results  Component Value Date   HGBA1C 7.8 (H) 05/29/2021   Lab Results  Component Value Date   CHOL 133 05/29/2021   HDL 50 05/29/2021   LDLCALC 62 05/29/2021   TRIG 130 05/29/2021   CHOLHDL 2.7 05/29/2021   Lab Results  Component Value Date   CREATININE 0.76 07/25/2021   BUN 12 07/25/2021   NA 141 07/25/2021   K 4.2 07/25/2021   CL 105 07/25/2021   CO2 25 07/25/2021   BP Readings from Last 3 Encounters:  07/25/21 124/82  05/29/21 122/66  03/15/21 113/80   Pulse Readings from Last 3 Encounters:  07/25/21 90  05/29/21 79  03/15/21 85     SDOH:  (Social Determinants of Health) assessments and interventions performed:    [place]  Review of patient past medical history, allergies, medications, health status, including review of consultants reports, laboratory and other test data, was performed as part of comprehensive evaluation and provision of chronic care management services.   Care Plan : General Pharmacy (Adult)  Updates made by Rennis Petty, RPH-CPP since 09/11/2021 12:00 AM     Problem: Disease Progression      Long-Range Goal: Disease Progression Prevented or Minimized   Start Date: 01/23/2020  Expected End Date: 04/22/2020  This Visit's Progress: On track  Recent Progress: On track  Priority: High  Note:   Current Barriers:  Unable to independently afford treatment regimen Patient APPROVED for [name] and Ozempic medication assistance from [employer] for 2023 calendar year  Pharmacist Clinical Goal(s):  Over the next 90 days, patient will achieve control of T2DM as evidenced by A1C <7.0%. through collaboration with PharmD and provider.  Over the next 90 days, patient will maintain control of LDL as evidenced by LDL <70 mg/dL through collaboration with PharmD and provider.  Over the next 90 days, patient will  verbalize ability to afford treatment regimen. through collaboration with PharmD and provider.  Interventions: Inter-disciplinary care team collaboration (see longitudinal plan of care) 1:1 collaboration with [name], NP regarding development and update of comprehensive plan of care as evidenced by provider attestation and co-signature  Diabetes: Current treatment: Tresiba 20 units once daily Metformin ER 500 mg - 3 tablets daily with breakfast Ozempic 1 mg once weekly Patient using Freestyle Libre 3 continuous blood glucose monitor Using her iPhone in place of reader devices Review results from Freestyle LibreView:    From review of CGM data, note daily log indicates patient had 3 low glucose events yesterday, 7/25.  Today patient advises  these low readings from CGM were due to sensor error. Reports sensor was showing errors on her end and she monitored blood sugar instead with glucometer and blood sugar readings were >100. Reports she since replaced sensor, starting a new one today and has not had errors since. Reports continues to do well with using feedback from CGM to help guide dietary choices Notes has identified particular carbohydrates that were impacting her blood sugar and is adjusting portion sizes of these and is making sure to include protein in her meals Counsel patient to have regular well-balanced meals, including a controlled portion size (being sure to include the carbohydrates as part of the balanced meal) Review importance of monitoring for low blood sugar and how to manage hypoglycemia Confirms carries glucose tablets with her Patient planning to continue CGM for the next 14 days (current sensor) and then stop for now as feels that she has gained the insight that she was looking for from this tool  Patient Goals/Self-Care Activities Over the next 90 days, patient will:  Take medications as prescribed Check blood glucose daily, document, and provide at future  appointments Keeps log in App on phone Collaborate with provider on medication access solutions Attends all scheduled provider appointments Calls pharmacy for medication refills Calls provider office for new concerns or questions  Follow Up Plan: Telephone follow up appointment with care management team member scheduled for: 10/28/2021 at 8:30 AM      Wallace Cullens, PharmD, [place] [phone]

## 2021-09-11 NOTE — Patient Instructions (Signed)
Visit Information  Thank you for taking time to visit with me today. Please don't hesitate to contact me if I can be of assistance to you before our next scheduled telephone appointment.  Following are the goals we discussed today:   Goals Addressed             This Visit's Progress    Pharmacy Goals       Our goal A1c is less than 7%. This corresponds with fasting sugars less than 130 and 2 hour after meal sugars less than 180. Please check your blood sugars and keep log of results  Our goal bad cholesterol, or LDL, is less than 70 . This is why it is important to continue taking your atorvastatin  Please check your home blood pressure, keep a log of the results and bring this with you to your medical appointments.  Feel free to call me with any questions or concerns. I look forward to our next call!    Wallace Cullens, PharmD, [place] [phone]          Our next appointment is by telephone on 10/28/2021 at 8:30 AM  Please call the care guide team at [phone] if you need to cancel or reschedule your appointment.    Patient verbalizes understanding of instructions and care plan provided today and agrees to view in [place]. Active MyChart status and patient understanding of how to access instructions and care plan via MyChart confirmed with patient.

## 2021-09-16 ENCOUNTER — Other Ambulatory Visit: Payer: Self-pay | Admitting: Internal Medicine

## 2021-09-16 DIAGNOSIS — E1165 Type 2 diabetes mellitus with hyperglycemia: Secondary | ICD-10-CM

## 2021-09-16 DIAGNOSIS — Z794 Long term (current) use of insulin: Secondary | ICD-10-CM | POA: Diagnosis not present

## 2021-09-16 DIAGNOSIS — M810 Age-related osteoporosis without current pathological fracture: Secondary | ICD-10-CM

## 2021-09-16 NOTE — Telephone Encounter (Signed)
Medication Refill - Medication:  alendronate (FOSAMAX) 70 MG tablet  Has the patient contacted their pharmacy? Yes.   (Agent: If no, request that the patient contact the pharmacy for the refill. If patient does not wish to contact the pharmacy document the reason why and proceed with request.) (Agent: If yes, when and what did the pharmacy advise?)no refills/ call pcp  Preferred Pharmacy (with phone number or street name): [facility] Has the patient been seen for an appointment in the last year OR does the patient have an upcoming appointment? Yes.    Agent: Please be advised that RX refills may take up to 3 business days. We ask that you follow-up with your pharmacy.

## 2021-09-17 MED ORDER — ALENDRONATE SODIUM 70 MG PO TABS: 70.0000 mg | ORAL_TABLET | ORAL | 0 refills | Status: DC

## 2021-09-17 NOTE — Telephone Encounter (Signed)
Requested medications are due for refill today.  A little soon  Requested medications are on the active medications list.  yes  Last refill. 04/02/2021 #12 1 refill  Future visit scheduled.   yes  Notes to clinic.  Missing/expired labs.    Requested Prescriptions  Pending Prescriptions Disp Refills   alendronate (FOSAMAX) 70 MG tablet 12 tablet 1    Sig: Take 1 tablet (70 mg total) by mouth once a week. Take with a full glass of water on an empty stomach.     Endocrinology:  Bisphosphonates Failed - 09/16/2021 10:10 AM      Failed - Vitamin D in normal range and within 360 days    No results found for: "XT0626RS8", "NI6270JJ0", "VD125OH2TOT", "25OHVITD3", "25OHVITD2", "25OHVITD1", "VD25OH"       Failed - Mg Level in normal range and within 360 days    No results found for: "MG"       Failed - Phosphate in normal range and within 360 days    No results found for: "PHOS"       Failed - Bone Mineral Density or Dexa Scan completed in the last 2 years      Passed - Ca in normal range and within 360 days    Calcium  Date Value Ref Range Status  07/25/2021 9.6 8.6 - 10.4 mg/dL Final         Passed - Cr in normal range and within 360 days    Creat  Date Value Ref Range Status  07/25/2021 0.76 0.50 - 1.05 mg/dL Final   Creatinine, Urine  Date Value Ref Range Status  12/17/2020 51 20 - 275 mg/dL Final         Passed - eGFR is 30 or above and within 360 days    GFR, Est African American  Date Value Ref Range Status  06/10/2018 91 > OR = 60 mL/min/1.63m2 Final   GFR, Est Non African American  Date Value Ref Range Status  06/10/2018 79 > OR = 60 mL/min/1.81m2 Final   eGFR  Date Value Ref Range Status  07/25/2021 85 > OR = 60 mL/min/1.48m2 Final    Comment:    The eGFR is based on the CKD-EPI 2021 equation. To calculate  the new eGFR from a previous Creatinine or Cystatin C result, go to https://www.kidney.org/professionals/ kdoqi/gfr%5Fcalculator          Passed -  Valid encounter within last 12 months    Recent Outpatient Visits           1 month ago [facility] [place], [place] W, NP   3 months ago Type 2 diabetes mellitus with hyperglycemia, without long-term current use of insulin [facility])   [facility] [place], Coralie Keens, NP   6 months ago Gastroesophageal reflux disease without esophagitis   [place], DO   9 months ago Encounter for general adult medical examination with abnormal findings   [facility] [place], Coralie Keens, NP   1 year ago Bilateral hip pain   [facility] [place], Coralie Keens, NP       Future Appointments             In 2 months Baity, Coralie Keens, NP [facility], [facility]

## 2021-09-22 ENCOUNTER — Other Ambulatory Visit: Payer: Self-pay | Admitting: Internal Medicine

## 2021-09-22 DIAGNOSIS — K219 Gastro-esophageal reflux disease without esophagitis: Secondary | ICD-10-CM

## 2021-09-22 DIAGNOSIS — K279 Peptic ulcer, site unspecified, unspecified as acute or chronic, without hemorrhage or perforation: Secondary | ICD-10-CM

## 2021-09-22 DIAGNOSIS — R112 Nausea with vomiting, unspecified: Secondary | ICD-10-CM

## 2021-09-23 LAB — HM DIABETES EYE EXAM

## 2021-09-23 NOTE — Telephone Encounter (Signed)
Requested Prescriptions  Pending Prescriptions Disp Refills  . omeprazole (PRILOSEC) 40 MG capsule [Pharmacy Med Name: Omeprazole 40 MG Oral Capsule Delayed Release] 90 capsule 0    Sig: TAKE 1 CAPSULE BY MOUTH ONCE DAILY BEFORE BREAKFAST     Gastroenterology: Proton Pump Inhibitors Passed - 09/22/2021  1:44 PM      Passed - Valid encounter within last 12 months    Recent Outpatient Visits          2 months ago [facility] [place], [place] W, NP   3 months ago Type 2 diabetes mellitus with hyperglycemia, without long-term current use of insulin [facility])   [facility] [place], Coralie Keens, NP   6 months ago Gastroesophageal reflux disease without esophagitis   [place], DO   9 months ago Encounter for general adult medical examination with abnormal findings   [facility] [place], Coralie Keens, NP   1 year ago Bilateral hip pain   [facility] [place], Coralie Keens, NP      Future Appointments            In 2 months Baity, Coralie Keens, NP [facility], [facility]

## 2021-09-26 ENCOUNTER — Telehealth: Payer: PPO

## 2021-09-26 ENCOUNTER — Ambulatory Visit (INDEPENDENT_AMBULATORY_CARE_PROVIDER_SITE_OTHER): Payer: PPO

## 2021-09-26 DIAGNOSIS — E782 Mixed hyperlipidemia: Secondary | ICD-10-CM

## 2021-09-26 DIAGNOSIS — G8929 Other chronic pain: Secondary | ICD-10-CM

## 2021-09-26 DIAGNOSIS — I10 Essential (primary) hypertension: Secondary | ICD-10-CM

## 2021-09-26 DIAGNOSIS — E1165 Type 2 diabetes mellitus with hyperglycemia: Secondary | ICD-10-CM

## 2021-09-26 NOTE — Patient Instructions (Signed)
Visit Information  Thank you for taking time to visit with me today. Please don't hesitate to contact me if I can be of assistance to you before our next scheduled telephone appointment.  Following are the goals we discussed today:  Current Barriers: 09-26-2021: The patient is doing well and has met the goals of care. The patient knows the [facility] is not going to be making regular outreaches. Verbalized understanding. The patient states she is still having a lot of stress but doing well right now. She states that she is going to [place] on 10-10-2021 and she is excited about this. Education on making sure she has her medications and glucose meter. Also discussed reaching out for changes in her chronic conditions. She feels she is stable and doing well currently. The patient has the [facility] number for new needs or concerns when they arise. Care plan being closed.  Knowledge Deficits related to plan of care for management of HTN, HLD, DMII, and Chronic pain and osteoporosis   Chronic Disease Management support and education needs related to HTN, HLD, DMII, and Chronic pain and osteoporosis    RNCM Clinical Goal(s):  Patient will verbalize understanding of plan for management of HTN, HLD, DMII, Osteoporosis, and chronic pain  verbalize basic understanding of HTN, HLD, DMII, Osteoporosis, and chronic pain disease process and self health management plan  take all medications exactly as prescribed and will call provider for medication related questions demonstrate understanding of rationale for each prescribed medication  attend all scheduled medical appointments:Patient requesting a new appointment for follow up with the pcp and blood work. Have contacted the administrative staff for assistance  demonstrate improved and ongoing adherence to prescribed treatment plan for HTN, HLD, DMII, Osteoporosis, and Chronic pain as evidenced by daily monitoring and recording of CBG  adherence to ADA/ carb modified diet  adherence to prescribed medication regimen contacting provider for new or worsened symptoms or questions  demonstrate improved and ongoing health management independence  work with pharmacist to address DM management and medications needs related to DMII demonstrate a decrease in HTN, HLD, DMII, Osteoporosis, and chronic pain exacerbations  demonstrate ongoing self health care management ability for effective management of chronic conditions  through collaboration with RN Care manager, provider, and care team.    Interventions: 1:1 collaboration with primary care provider regarding development and update of comprehensive plan of care as evidenced by provider attestation and co-signature Inter-disciplinary care team collaboration (see longitudinal plan of care) Evaluation of current treatment plan related to  self management and patient's adherence to plan as established by provider     SDOH Barriers (Status: Goal on track: YES.)  Patient interviewed and SDOH assessment performed        SDOH Interventions     Flowsheet Row Most Recent Value  SDOH Interventions    Food Insecurity Interventions Intervention Not Indicated  Financial Strain Interventions Other (Comment)  [patient assistance program for medications]  Housing Interventions Intervention Not Indicated  Intimate Partner Violence Interventions Intervention Not Indicated  Physical Activity Interventions Other (Comments)  [the patient is active and cleans houses, 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]  Stress Interventions Intervention Not Indicated  Social Connections Interventions Other (Comment)  [good support system]  Transportation Interventions Intervention Not Indicated       Patient interviewed and appropriate assessments performed Provided patient with information about resources available for SDOH needs  Discussed plans with patient for ongoing care management follow up and provided patient with direct contact  information for care management team Advised patient to call the office for changes in SDOH, questions or concerns       Diabetes:  (Status: Goal on track: YES.)      Lab Results  Component Value Date    HGBA1C 7.8 (H) 05/29/2021  12-17-2020:  7.5% Assessed patient's understanding of A1c goal: <7%. 07-11-2021: The patient is frustrated because her blood sugars are up and down. She wanted her A1C to be down but it was up more. She has determined that rice is what is really throwing her blood sugars off. She is going to stop eating rice.  Provided education to patient about basic DM disease process. 07-11-2021: Review and education provided. The patient wanted to know if stress would be causing her blood sugars to be elevated. Discussed that stress definitely can impact her health and well being. Reflective listening and support given.  Reviewed medications with patient and discussed importance of medication adherence. 07-11-2021: The patient is compliant with her medications. Denies any issues with medications compliance        Reviewed prescribed diet with patient heart healthy/ADA. 12-17-2020: Supplied the patient with Glucerna samples to try to help with adding nutrients to her diet. 05-02-2021: The patient is eating well and denies any new issues with dietary restrictions. 07-11-2021: The patient is eating well. Has determined that even a little bit of rice makes her blood sugars go crazy. The patient states that she is going to eliminate rice altogether. Discussed emotional eating and monitoring for foods high in carbs ; Counseled on importance of regular laboratory monitoring as prescribed. 12-17-2020: The patient has pending lab work today. 03-07-2021: Has regular labwork. No acute findings. 05-02-2021: The patient requesting assistance with an appointment for follow up with the pcp for new blood work to check A1C. 07-11-2021: The patient had A1C done recently and she knows what the goal of A1C levels  are;        Discussed plans with patient for ongoing care management follow up and provided patient with direct contact information for care management team;      Provided patient with written educational materials related to hypo and hyperglycemia and importance of correct treatment. 03-07-2021: Lowest blood sugar reading has been 87. Highest she has seen is 125. 05-02-2021: States that her blood sugars are stable and doing well. Does need new lab work. Working with office staff to get an appointment.  07-11-2021: The patient states that her blood sugars have been up and down. The patient states that the lowest she has seen is 85 and the highest has been 165. Education and support given.     Reviewed scheduled/upcoming provider appointments including: 12-02-2021 at 8 am Advised patient, providing education and rationale, to check cbg as directed  and record. 03-07-2021:The patient is compliant with recommendations to check blood sugars. Gave range of blood sugars 87 to 125. Ask about the   freestyle [place]. Education given. The patient states she does not know that she needs this. 05-02-2021: The patient states that her levels are doing well. The patient denies any changes with her DM. 07-11-2021: The patient is having readings up and down. Is monitoring her diet closely and range has been 85 to 165. Will continue to monitor for changes.     call provider for findings outside established parameters. 12-17-2020: States her blood sugars are doing well and states they are in the 100's. 07-11-2021: Knows to call for changes in condition and if her blood sugars  are out of range      Referral made to pharmacy team for assistance with medications assistance program and effective management of DM. 07-11-2021: Is working with the pharm D for ongoing support and education for medications        Review of patient status, including review of consultants reports, relevant laboratory and other test results, and medications  completed;       Screening for signs and symptoms of depression related to chronic disease state;        Assessed social determinant of health barriers;          Osteoporosis  (Status: Goal on track: YES.) Evaluation of current treatment plan related to Osteoporosis,  self-management and patient's adherence to plan as established by provider. 05-02-2021: The patient is active and denies any issues. Review of safety precautions. 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 07-11-2021: Education and support given. The patient is very active and under a lot of stress right now. Discussed self care and focusing on her health and well being. Denies any safety concerns at this time. Will continue to monitor.  Discussed plans with patient for ongoing care management follow up and provided patient with direct contact information for care management team Advised patient to call the office for any falls, fractures, or changes in osteoporosis health; Provided education to patient re: safety, being careful and prevention of falls, notifying provider of changes in condtions; Reviewed medications with patient and discussed compliance. 07-11-2021: The patient is compliant with medications, denies any new concerns; Reviewed scheduled/upcoming provider appointments including : 12-02-2021 at 0800 am Discussed plans with patient for ongoing care management follow up and provided patient with direct contact information for care management team;   Hyperlipidemia:  (Status: Goal on track: YES.)      Lab Results  Component Value Date    CHOL 133 05/29/2021    HDL 50 05/29/2021    LDLCALC 62 05/29/2021    TRIG 130 05/29/2021    CHOLHDL 2.7 05/29/2021    Medication review performed; medication list updated in electronic medical record. 07-11-2021: Is compliant with medications. Takes Atorvastatin 40 mg QD Provider established cholesterol goals reviewed.  07-11-2021: Praised for cholesterol levels being at goal; Counseled on importance of regular laboratory monitoring as prescribed. 07-11-2021: Knows to get regular labs done for monitoring  Provided HLD educational materials; Reviewed role and benefits of statin for ASCVD risk reduction; Discussed strategies to manage statin-induced myalgias; Reviewed importance of limiting foods high in cholesterol. 05-02-2021: Discussed heart healthy/ADA diet. The patient eats chicken but not a lot of other meats. She likes vegetables and is considering adding more beans to her dietary intake. Discussed foods low cholesterol options. 07-11-2021: The patient is being more mindful of her dietary intake. Education and support given to monitor for foods high in cholesterol.    Hypertension: (Status: Goal on track: YES.) Last practice recorded BP readings:     BP Readings from Last 3 Encounters:  05/29/21 122/66  03/15/21 113/80  12/17/20 130/86  Most recent eGFR/CrCl:       Lab Results  Component Value Date    EGFR 95 05/29/2021    No components found for: CRCL   Evaluation of current treatment plan related to hypertension self management and patient's adherence to plan as established by provider. 12-17-2020: The patient feels she is doing well with management of her HTN. Denies any headache pain. She  is normotensive in the office today. 05-02-2021: Blood pressures are normal. No issues with HTN or heart health. 07-11-2021: The patient is having some dizziness in the mornings. After assessment these events are happening when she changes position. The patient states that she has noticed this more when she is moving. Education on orthostatic hypotension and likely her blood pressures dropping when she changes position. The patient ask about a blood pressure cuff to check her blood pressures. Will collaborate with the pcp and pharm D for assistance. May be able to provide one from the office from donated cuffs. Will discuss  with team and see what the best options are. Review of safety and fall precautions;   Provided education to patient re: stroke prevention, s/s of heart attack and stroke; Reviewed prescribed diet heart healthy/ADA. 07-11-2021: Review of heart healthy/ADA diet. The patient is being more mindful of her dietary restrictions  Reviewed medications with patient and discussed importance of compliance. 07-11-2021: The patient endorses compliance with medications.   Discussed plans with patient for ongoing care management follow up and provided patient with direct contact information for care management team; Advised patient, providing education and rationale, to monitor blood pressure daily and record, calling PCP for findings outside established parameters;  Reviewed scheduled/upcoming provider appointments including: Needs new appointment, awaiting assistance for new appointment.    Pain:  (Status: Goal on Track (progressing): YES.) Pain assessment performed. 12-17-2020: The patient in the office today to follow up with pcp for chronic back pain and discomfort. Recommendations provided to take Tylenol Arthritis 650 mg BID.  The patient states this is the biggest issue she has right now. She states she has to work to pay her bills. She says when she has to do the physical labor this exacerbates her pain in her back. Education and support given. 03-07-2021: The patient states she has been so busy that she has not had time to think about her pain. States she is doing well today. 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 Denies any acute findings. Will continue to monitor. 05-02-2021: The patient is rating her pain today at a 3 in her back. The patient states that was at a 10 yesterday.  She states she was getting stuff ready for her store and feels a lot better today. 07-11-2021: The patient is rating her pain at a 2 to 3 today. States that she is always having back pain but it  is better since she is not cleaning. The patient states that she is trying to be more mindful of what she is doing with her activity. She can tolerate it better at this level.  Medications reviewed. 03-07-2021: The pcp recommended the patient take Tylenol Arthritis BID for pain relief and discomfort. 07-11-2021: The patient states that she is taking her medications when needed. Reviewed provider established plan for pain management. 12-06-2020: The patient states her pain level is a 7 today on a scale of 0-10. 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 She plans on talking to the pcp about options for pain management on 12-17-2020. Heat application and Tylenol Arthritis are sometimes effective. She denies any acute distress but knows she needs some kind of relief from her chronic back pain. Reminded the patient to write down questions to ask the pcp at her upcoming appointment. Will continue to monitor. 12-17-2020: Discussed pacing her activity and resting when she needs to rest. The patient  states that she feels great other than the back pain. She is going to start taking Tylenol Arthritis 650 mg BID to see how this works for her. She does lay down when the pain is intense. Discussed limiting physical activity when pain is at its worse. Empathetic listening and support given. 05-02-2021: The patient states she is doing well and is staying very busy. States her pain is on the back burner and she is working on getting her Orthoptist" open and running. 07-11-2021: The patient states currently what she is doing is managing her pain well. Will continue to monitor for changes.  Discussed importance of adherence to all scheduled medical appointments. 07-11-2021: Will see the pcp 12-02-2021; Counseled on the importance of reporting any/all new or changed pain symptoms or management strategies to pain management provider; Advised patient to report to care team affect of pain on daily  activities; Discussed use of relaxation techniques and/or diversional activities to assist with pain reduction (distraction, imagery, relaxation, massage, acupressure, TENS, heat, and cold application; Reviewed with patient prescribed pharmacological and nonpharmacological pain relief strategies; Advised patient to discuss unrelieved pain, changes in intensity and level of pain with provider; Screening for signs and symptoms of depression related to chronic disease state;  Assessed social determinant of health barriers;      GERD  (Status: Goal on Track (progressing): YES.) Long Term Goal  Evaluation of current treatment plan related to GERD,  self-management and patient's adherence to plan as established by provider. 07-11-2021: States her GERD is well controlled at this time. She is taking her medications as directed and monitoring dietary restrictions.  Discussed plans with patient for ongoing care management follow up and provided patient with direct contact information for care management team Advised patient to call the office for changes in GI issues, questions or concerns; Provided education to patient re: monitoring for foods that may exacerbate her GERD, reporting new changes in GERD, and reaching out to the CCM team or pcp for questions or concerns; Reviewed medications with patient and discussed 07-11-2021: The patient is compliant with medications and says the medications has been effective in managing her GERD; Discussed plans with patient for ongoing care management follow up and provided patient with direct contact information for care management team; Advised patient to discuss call for changes in GERD with provider;      Anxiety and Stress  (Status: New goal. Goal on Track (progressing): YES.) Long Term Goal  Evaluation of current treatment plan related to Anxiety and stress , Mental Health Concerns  self-management and patient's adherence to plan as established by  provider. Discussed plans with patient for ongoing care management follow up and provided patient with direct contact information for care management team Advised patient to call the office for changes in mood, anxiety, depression, or increased stressors impacting her care. The patient states that her marriage is not doing well and she is tired of dealing with her husband and the way he is acting. She wants to get out of debt and therefore she is looking to sell her house and rent something. She has people that she knows that are willing to help her and have her best interest at heart. Education and support given.; Provided education to patient re: self care, being mindful of how stress impacts her health and well being and trying to not worry about things out of her control; Collaborated with LCSW  regarding stressors with her marriage impacting her health and well being; Reviewed scheduled/upcoming  provider appointments including 12-02-2021 at 8 am; Social Work referral for ongoing support and education for mental health well being. ; Discussed plans with patient for ongoing care management follow up and provided patient with direct contact information for care management team; Advised patient to discuss mental health concerns with provider;       Please call the care guide team at [phone] if you need to  schedule an appointment.   If you are experiencing a Mental Health or [place] or need someone to talk to, please call the Suicide and Crisis Lifeline: 988 call the [place] National Suicide Prevention Lifeline: (903)596-2613 or TTY: 539 137 1339 TTY 438-097-2642) to talk to a trained counselor call 1-800-273-TALK (toll free, 24 hour hotline)   Patient verbalizes understanding of instructions and care plan provided today and agrees to view in [place]. Active MyChart status and patient understanding of how to access instructions and care plan via MyChart confirmed with  patient.     Noreene Larsson RN, MSN, [facility] Mobile: [phone]

## 2021-09-26 NOTE — Chronic Care Management (AMB) (Signed)
Chronic Care Management   CCM RN Visit Note  09/26/2021 Name: Summer Young MRN: [id number] DOB: 1950-06-21  Subjective: Summer Young is a 71 y.o. year old female who is a primary care patient of [name], NP. The care management team was consulted for assistance with disease management and care coordination needs.    Engaged with patient by telephone for follow up visit in response to provider referral for case management and/or care coordination services.   Consent to Services:  The patient was given information about Chronic Care Management services, agreed to services, and gave verbal consent prior to initiation of services.  Please see initial visit note for detailed documentation.   Patient agreed to services and verbal consent obtained.   Assessment: Review of patient past medical history, allergies, medications, health status, including review of consultants reports, laboratory and other test data, was performed as part of comprehensive evaluation and provision of chronic care management services.   SDOH (Social Determinants of Health) assessments and interventions performed:  SDOH Interventions    Flowsheet Row Most Recent Value  SDOH Interventions   Food Insecurity Interventions Intervention Not Indicated  Financial Strain Interventions Intervention Not Indicated  Housing Interventions Intervention Not Indicated  Stress Interventions Other (Comment)  [has some different things going on in her life, she handles well]  Social Connections Interventions Other (Comment)  [good support system]  Transportation Interventions Intervention Not Indicated        CCM Care Plan  Allergies  Allergen Reactions   Aspirin Hives    Other reaction(s): Other (See Comments) Other Reaction: Not Assessed    Insulin Detemir Hives and Rash    Errythema, edema, heat at site of injection did not improve after 2 weeks of use.     Shellfish Allergy Anaphylaxis   Dapagliflozin Other  (See Comments)    Yeast infections Yeast infection Farxiga   Influenza Vaccines Other (See Comments)    Forxiga   Other     Other reaction(s): Other (See Comments) Uncoded Allergy. Allergen: Shellfish, Other Reaction: Not Assessed    Flu vaccine given 1998 caused anaphylaxis    Outpatient Encounter Medications as of 09/26/2021  Medication Sig Note   acetaminophen (TYLENOL) 650 MG CR tablet Take 650 mg by mouth 2 (two) times daily as needed for pain.    alendronate (FOSAMAX) 70 MG tablet Take 1 tablet (70 mg total) by mouth once a week. Take with a full glass of water on an empty stomach.    Ascorbic Acid (VITAMIN C) 1000 MG tablet Take 1,000 mg by mouth daily as needed.  (Patient not taking: Reported on 09/11/2021) 09/20/2018: As needed to prevent illness   atorvastatin (LIPITOR) 40 MG tablet Take 1 tablet by mouth once daily    Biotin 1000 MCG tablet Take 4,000 mcg by mouth every other day.  01/19/2018: Takes every other day   blood glucose meter kit and supplies Dispense based on patient and insurance preference. Use up to four times daily as directed. (FOR ICD-10 E10.9, E11.9).    Blood Glucose Monitoring Suppl (ONETOUCH VERIO) w/Device KIT Use to check blood sugar as directed.  DX: E11.65    Cholecalciferol (VITAMIN D3) 50 MCG (2000 UT) capsule Take 1,000 Units by mouth daily.     Continuous Blood Gluc Sensor (FREESTYLE LIBRE 3 SENSOR) MISC Place 1 sensor on the skin every 14 days DX: E11.65    EPINEPHrine 0.3 mg/0.3 mL IJ SOAJ injection Inject 0.3 mg into the muscle as needed for  anaphylaxis.    glucose blood (ONETOUCH VERIO) test strip Use to check blood sugar as directed  DX: E11.65    insulin degludec (TRESIBA FLEXTOUCH) 100 UNIT/ML FlexTouch Pen Inject 26 Units into the skin daily. (Patient taking differently: Inject 20 Units into the skin daily.)    meclizine (ANTIVERT) 25 MG tablet Take 1 tablet (25 mg total) by mouth 3 (three) times daily as needed for dizziness. (Patient not taking:  Reported on 09/11/2021)    metFORMIN (GLUCOPHAGE-XR) 500 MG 24 hr tablet TAKE 3 TABLETS BY MOUTH ONCE DAILY WITH BREAKFAST    omeprazole (PRILOSEC) 40 MG capsule TAKE 1 CAPSULE BY MOUTH ONCE DAILY BEFORE BREAKFAST    OneTouch Delica Lancets 46O MISC Use to check blood sugar as directed.  DX: E11.65    ONETOUCH ULTRA test strip Check blood sugar 1 x daily as directed    Semaglutide, 1 MG/DOSE, (OZEMPIC, 1 MG/DOSE,) 2 MG/1.5ML SOPN INJECT 1 MG SUBCUTANEOUSLY ONCE A WEEK    spironolactone (ALDACTONE) 25 MG tablet Take 1 tablet by mouth once daily    vitamin B-12 (CYANOCOBALAMIN) 1000 MCG tablet Take 1,000 mcg by mouth daily.     No facility-administered encounter medications on file as of 09/26/2021.    Patient Active Problem List   Diagnosis Date Noted   GERD (gastroesophageal reflux disease) 05/29/2021   Mixed incontinence 12/17/2020   Overweight with body mass index (BMI) of 29 to 29.9 in adult 08/28/2020   Osteoporosis 08/16/2019   Chronic bilateral low back pain without sciatica 11/22/2014   Chronic pain of both knees 11/22/2014   Essential hypertension 11/22/2014   Dyslipidemia associated with type 2 diabetes mellitus ([place]) 11/22/2014   Type 2 diabetes mellitus with hyperglycemia ([place]) 01/19/2013    Conditions to be addressed/monitored:HTN, HLD, DMII, and chronic pain  Care Plan : RNCM: General Plan of Care (Adult) for Chronic Disease Management and Care Coordination Needs  Updates made by [name], RN since 09/26/2021 12:00 AM  Completed 09/26/2021   Problem: RNCM: Development for plan of care for Chronic Disease Management (HTN, HLD, DM, Chronic pain, osteoporosis) Resolved 09/26/2021  Priority: High     Long-Range Goal: RNCM: Development for plan of care for Chronic Disease Management (HTN, HLD, DM, Chronic pain, osteoporosis) Completed 09/26/2021  Start Date: 12/06/2020  Expected End Date: 12/06/2021  Priority: High  Note:   Current Barriers: 09-26-2021: The patient is  doing well and has met the goals of care. The patient knows the [facility] is not going to be making regular outreaches. Verbalized understanding. The patient states she is still having a lot of stress but doing well right now. She states that she is going to [place] on 10-10-2021 and she is excited about this. Education on making sure she has her medications and glucose meter. Also discussed reaching out for changes in her chronic conditions. She feels she is stable and doing well currently. The patient has the [facility] number for new needs or concerns when they arise. Care plan being closed.  Knowledge Deficits related to plan of care for management of HTN, HLD, DMII, and Chronic pain and osteoporosis   Chronic Disease Management support and education needs related to HTN, HLD, DMII, and Chronic pain and osteoporosis   RNCM Clinical Goal(s):  Patient will verbalize understanding of plan for management of HTN, HLD, DMII, Osteoporosis, and chronic pain  verbalize basic understanding of HTN, HLD, DMII, Osteoporosis, and chronic pain disease process and self health management plan  take all medications  exactly as prescribed and will call provider for medication related questions demonstrate understanding of rationale for each prescribed medication  attend all scheduled medical appointments:Patient requesting a new appointment for follow up with the pcp and blood work. Have contacted the administrative staff for assistance  demonstrate improved and ongoing adherence to prescribed treatment plan for HTN, HLD, DMII, Osteoporosis, and Chronic pain as evidenced by daily monitoring and recording of CBG  adherence to ADA/ carb modified diet adherence to prescribed medication regimen contacting provider for new or worsened symptoms or questions  demonstrate improved and ongoing health management independence  work with pharmacist to address DM management and medications needs related to DMII demonstrate a decrease in HTN,  HLD, DMII, Osteoporosis, and chronic pain exacerbations  demonstrate ongoing self health care management ability for effective management of chronic conditions  through collaboration with RN Care manager, provider, and care team.   Interventions: 1:1 collaboration with primary care provider regarding development and update of comprehensive plan of care as evidenced by provider attestation and co-signature Inter-disciplinary care team collaboration (see longitudinal plan of care) Evaluation of current treatment plan related to  self management and patient's adherence to plan as established by provider   SDOH Barriers (Status: Goal on track: YES.)  Patient interviewed and SDOH assessment performed        SDOH Interventions    Flowsheet Row Most Recent Value  SDOH Interventions   Food Insecurity Interventions Intervention Not Indicated  Financial Strain Interventions Other (Comment)  [patient assistance program for medications]  Housing Interventions Intervention Not Indicated  Intimate Partner Violence Interventions Intervention Not Indicated  Physical Activity Interventions Other (Comments)  [the patient is active and cleans houses, 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]  Stress Interventions Intervention Not Indicated  Social Connections Interventions Other (Comment)  [good support system]  Transportation Interventions Intervention Not Indicated     Patient interviewed and appropriate assessments performed Provided patient with information about resources available for SDOH needs  Discussed plans with patient for ongoing care management follow up and provided patient with direct contact information for care management team Advised patient to call the office for changes in [place], questions or concerns    Diabetes:  (Status: Goal on track: YES.) Lab Results  Component Value Date   HGBA1C 7.8 (H) 05/29/2021  12-17-2020:  7.5% Assessed patient's understanding of A1c goal: <7%.  07-11-2021: The patient is frustrated because her blood sugars are up and down. She wanted her A1C to be down but it was up more. She has determined that rice is what is really throwing her blood sugars off. She is going to stop eating rice.  Provided education to patient about basic DM disease process. 07-11-2021: Review and education provided. The patient wanted to know if stress would be causing her blood sugars to be elevated. Discussed that stress definitely can impact her health and well being. Reflective listening and support given.  Reviewed medications with patient and discussed importance of medication adherence. 07-11-2021: The patient is compliant with her medications. Denies any issues with medications compliance        Reviewed prescribed diet with patient heart healthy/ADA. 12-17-2020: Supplied the patient with Glucerna samples to try to help with adding nutrients to her diet. 05-02-2021: The patient is eating well and denies any new issues with dietary restrictions. 07-11-2021: The patient is eating well. Has determined that even a little bit of rice makes her blood sugars go crazy. The patient states that she is going  to eliminate rice altogether. Discussed emotional eating and monitoring for foods high in carbs ; Counseled on importance of regular laboratory monitoring as prescribed. 12-17-2020: The patient has pending lab work today. 03-07-2021: Has regular labwork. No acute findings. 05-02-2021: The patient requesting assistance with an appointment for follow up with the pcp for new blood work to check A1C. 07-11-2021: The patient had A1C done recently and she knows what the goal of A1C levels are;        Discussed plans with patient for ongoing care management follow up and provided patient with direct contact information for care management team;      Provided patient with written educational materials related to hypo and hyperglycemia and importance of correct treatment. 03-07-2021: Lowest blood  sugar reading has been 87. Highest she has seen is 125. 05-02-2021: States that her blood sugars are stable and doing well. Does need new lab work. Working with office staff to get an appointment.  07-11-2021: The patient states that her blood sugars have been up and down. The patient states that the lowest she has seen is 85 and the highest has been 165. Education and support given.     Reviewed scheduled/upcoming provider appointments including: 12-02-2021 at 8 am Advised patient, providing education and rationale, to check cbg as directed  and record. 03-07-2021:The patient is compliant with recommendations to check blood sugars. Gave range of blood sugars 87 to 125. Ask about the   freestyle [place]. Education given. The patient states she does not know that she needs this. 05-02-2021: The patient states that her levels are doing well. The patient denies any changes with her DM. 07-11-2021: The patient is having readings up and down. Is monitoring her diet closely and range has been 85 to 165. Will continue to monitor for changes.     call provider for findings outside established parameters. 12-17-2020: States her blood sugars are doing well and states they are in the 100's. 07-11-2021: Knows to call for changes in condition and if her blood sugars are out of range      Referral made to pharmacy team for assistance with medications assistance program and effective management of DM. 07-11-2021: Is working with the pharm D for ongoing support and education for medications        Review of patient status, including review of consultants reports, relevant laboratory and other test results, and medications completed;       Screening for signs and symptoms of depression related to chronic disease state;        Assessed social determinant of health barriers;         Osteoporosis  (Status: Goal on track: YES.) Evaluation of current treatment plan related to Osteoporosis,  self-management and patient's adherence to  plan as established by provider. 05-02-2021: The patient is active and denies any issues. Review of safety precautions. 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 07-11-2021: Education and support given. The patient is very active and under a lot of stress right now. Discussed self care and focusing on her health and well being. Denies any safety concerns at this time. Will continue to monitor.  Discussed plans with patient for ongoing care management follow up and provided patient with direct contact information for care management team Advised patient to call the office for any falls, fractures, or changes in osteoporosis health; Provided education to patient re: safety, being careful and prevention of  falls, notifying provider of changes in condtions; Reviewed medications with patient and discussed compliance. 07-11-2021: The patient is compliant with medications, denies any new concerns; Reviewed scheduled/upcoming provider appointments including : 12-02-2021 at 0800 am Discussed plans with patient for ongoing care management follow up and provided patient with direct contact information for care management team;  Hyperlipidemia:  (Status: Goal on track: YES.) Lab Results  Component Value Date   CHOL 133 05/29/2021   HDL 50 05/29/2021   LDLCALC 62 05/29/2021   TRIG 130 05/29/2021   CHOLHDL 2.7 05/29/2021   Medication review performed; medication list updated in electronic medical record. 07-11-2021: Is compliant with medications. Takes Atorvastatin 40 mg QD Provider established cholesterol goals reviewed. 07-11-2021: Praised for cholesterol levels being at goal; Counseled on importance of regular laboratory monitoring as prescribed. 07-11-2021: Knows to get regular labs done for monitoring  Provided HLD educational materials; Reviewed role and benefits of statin for ASCVD risk reduction; Discussed strategies to manage statin-induced  myalgias; Reviewed importance of limiting foods high in cholesterol. 05-02-2021: Discussed heart healthy/ADA diet. The patient eats chicken but not a lot of other meats. She likes vegetables and is considering adding more beans to her dietary intake. Discussed foods low cholesterol options. 07-11-2021: The patient is being more mindful of her dietary intake. Education and support given to monitor for foods high in cholesterol.   Hypertension: (Status: Goal on track: YES.) Last practice recorded BP readings:  BP Readings from Last 3 Encounters:  05/29/21 122/66  03/15/21 113/80  12/17/20 130/86  Most recent eGFR/CrCl:  Lab Results  Component Value Date   EGFR 95 05/29/2021    No components found for: CRCL  Evaluation of current treatment plan related to hypertension self management and patient's adherence to plan as established by provider. 12-17-2020: The patient feels she is doing well with management of her HTN. Denies any headache pain. She is normotensive in the office today. 05-02-2021: Blood pressures are normal. No issues with HTN or heart health. 07-11-2021: The patient is having some dizziness in the mornings. After assessment these events are happening when she changes position. The patient states that she has noticed this more when she is moving. Education on orthostatic hypotension and likely her blood pressures dropping when she changes position. The patient ask about a blood pressure cuff to check her blood pressures. Will collaborate with the pcp and pharm D for assistance. May be able to provide one from the office from donated cuffs. Will discuss with team and see what the best options are. Review of safety and fall precautions;   Provided education to patient re: stroke prevention, s/s of heart attack and stroke; Reviewed prescribed diet heart healthy/ADA. 07-11-2021: Review of heart healthy/ADA diet. The patient is being more mindful of her dietary restrictions  Reviewed medications  with patient and discussed importance of compliance. 07-11-2021: The patient endorses compliance with medications.   Discussed plans with patient for ongoing care management follow up and provided patient with direct contact information for care management team; Advised patient, providing education and rationale, to monitor blood pressure daily and record, calling PCP for findings outside established parameters;  Reviewed scheduled/upcoming provider appointments including: Needs new appointment, awaiting assistance for new appointment.   Pain:  (Status: Goal on Track (progressing): YES.) Pain assessment performed. 12-17-2020: The patient in the office today to follow up with pcp for chronic back pain and discomfort. Recommendations provided to take Tylenol Arthritis 650 mg BID.  The patient states this is the  biggest issue she has right now. She states she has to work to pay her bills. She says when she has to do the physical labor this exacerbates her pain in her back. Education and support given. 03-07-2021: The patient states she has been so busy that she has not had time to think about her pain. States she is doing well today. 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 Denies any acute findings. Will continue to monitor. 05-02-2021: The patient is rating her pain today at a 3 in her back. The patient states that was at a 10 yesterday.  She states she was getting stuff ready for her store and feels a lot better today. 07-11-2021: The patient is rating her pain at a 2 to 3 today. States that she is always having back pain but it is better since she is not cleaning. The patient states that she is trying to be more mindful of what she is doing with her activity. She can tolerate it better at this level.  Medications reviewed. 03-07-2021: The pcp recommended the patient take Tylenol Arthritis BID for pain relief and discomfort. 07-11-2021: The patient states that she is  taking her medications when needed. Reviewed provider established plan for pain management. 12-06-2020: The patient states her pain level is a 7 today on a scale of 0-10. 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 She plans on talking to the pcp about options for pain management on 12-17-2020. Heat application and Tylenol Arthritis are sometimes effective. She denies any acute distress but knows she needs some kind of relief from her chronic back pain. Reminded the patient to write down questions to ask the pcp at her upcoming appointment. Will continue to monitor. 12-17-2020: Discussed pacing her activity and resting when she needs to rest. The patient states that she feels great other than the back pain. She is going to start taking Tylenol Arthritis 650 mg BID to see how this works for her. She does lay down when the pain is intense. Discussed limiting physical activity when pain is at its worse. Empathetic listening and support given. 05-02-2021: The patient states she is doing well and is staying very busy. States her pain is on the back burner and she is working on getting her Orthoptist" open and running. 07-11-2021: The patient states currently what she is doing is managing her pain well. Will continue to monitor for changes.  Discussed importance of adherence to all scheduled medical appointments. 07-11-2021: Will see the pcp 12-02-2021; Counseled on the importance of reporting any/all new or changed pain symptoms or management strategies to pain management provider; Advised patient to report to care team affect of pain on daily activities; Discussed use of relaxation techniques and/or diversional activities to assist with pain reduction (distraction, imagery, relaxation, massage, acupressure, TENS, heat, and cold application; Reviewed with patient prescribed pharmacological and nonpharmacological pain relief strategies; Advised patient to discuss unrelieved pain,  changes in intensity and level of pain with provider; Screening for signs and symptoms of depression related to chronic disease state;  Assessed social determinant of health barriers;    GERD  (Status: Goal on Track (progressing): YES.) Long Term Goal  Evaluation of current treatment plan related to GERD,  self-management and patient's adherence to plan as established by provider. 07-11-2021: States her GERD is well controlled at this time. She is taking her medications as directed and monitoring dietary restrictions.  Discussed plans with patient for ongoing care management follow up and provided patient with direct contact information for care management team Advised patient to call the office for changes in GI issues, questions or concerns; Provided education to patient re: monitoring for foods that may exacerbate her GERD, reporting new changes in GERD, and reaching out to the CCM team or pcp for questions or concerns; Reviewed medications with patient and discussed 07-11-2021: The patient is compliant with medications and says the medications has been effective in managing her GERD; Discussed plans with patient for ongoing care management follow up and provided patient with direct contact information for care management team; Advised patient to discuss call for changes in GERD with provider;    Anxiety and Stress  (Status: New goal. Goal on Track (progressing): YES.) Long Term Goal  Evaluation of current treatment plan related to Anxiety and stress , Mental Health Concerns  self-management and patient's adherence to plan as established by provider. Discussed plans with patient for ongoing care management follow up and provided patient with direct contact information for care management team Advised patient to call the office for changes in mood, anxiety, depression, or increased stressors impacting her care. The patient states that her marriage is not doing well and she is tired of dealing with  her husband and the way he is acting. She wants to get out of debt and therefore she is looking to sell her house and rent something. She has people that she knows that are willing to help her and have her best interest at heart. Education and support given.; Provided education to patient re: self care, being mindful of how stress impacts her health and well being and trying to not worry about things out of her control; Collaborated with LCSW  regarding stressors with her marriage impacting her health and well being; Reviewed scheduled/upcoming provider appointments including 12-02-2021 at 8 am; Social Work referral for ongoing support and education for mental health well being. ; Discussed plans with patient for ongoing care management follow up and provided patient with direct contact information for care management team; Advised patient to discuss mental health concerns with provider;   Patient Goals/Self-Care Activities: Patient will self administer medications as prescribed as evidenced by self report/primary caregiver report  Patient will attend all scheduled provider appointments as evidenced by clinician review of documented attendance to scheduled appointments and patient/caregiver report Patient will call pharmacy for medication refills as evidenced by patient report and review of pharmacy fill history as appropriate Patient will attend church or other social activities as evidenced by patient report Patient will continue to perform ADL's independently as evidenced by patient/caregiver report Patient will continue to perform IADL's independently as evidenced by patient/caregiver report Patient will call provider office for new concerns or questions as evidenced by review of documented incoming telephone call notes and patient report Patient will work with BSW to address care coordination needs and will continue to work with the clinical team to address health care and disease management  related needs as evidenced by documented adherence to scheduled care management/care coordination appointments - schedule appointment with eye doctor - check blood sugar at prescribed times: before meals and at bedtime, when you have symptoms of low or high blood sugar, and before and after exercise - check feet daily for cuts, sores or redness - enter blood sugar readings and medication or insulin into daily log - take the blood sugar log to all doctor visits - trim toenails straight across -  drink 6 to 8 glasses of water each day - eat fish at least once per week - fill half of plate with vegetables - keep a food diary - limit fast food meals to no more than 1 per week - manage portion size - prepare main meal at home 3 to 5 days each week - read food labels for fat, fiber, carbohydrates and portion size - reduce red meat to 2 to 3 times a week - switch to sugar-free drinks - keep feet up while sitting - wash and dry feet carefully every day - wear comfortable, cotton socks - wear comfortable, well-fitting shoes - check blood pressure weekly - choose a place to take my blood pressure (home, clinic or office, retail store) - write blood pressure results in a log or diary - learn about high blood pressure - keep a blood pressure log - take blood pressure log to all doctor appointments - call doctor for signs and symptoms of high blood pressure - develop an action plan for high blood pressure - keep all doctor appointments - take medications for blood pressure exactly as prescribed - report new symptoms to your doctor - eat more whole grains, fruits and vegetables, lean meats and healthy fats - call for medicine refill 2 or 3 days before it runs out - take all medications exactly as prescribed - call doctor with any symptoms you believe are related to your medicine - call doctor when you experience any new symptoms - go to all doctor appointments as scheduled - adhere to  prescribed diet: heart healthy/ADA diet       Plan:No further follow up required: the patient has met the goals of care and care plan is being closed. The patient knows to call for changes or needs  Noreene Larsson RN, MSN, [place] Mobile: [phone]

## 2021-10-15 ENCOUNTER — Telehealth: Payer: PPO

## 2021-10-17 DIAGNOSIS — E1159 Type 2 diabetes mellitus with other circulatory complications: Secondary | ICD-10-CM

## 2021-10-17 DIAGNOSIS — Z794 Long term (current) use of insulin: Secondary | ICD-10-CM

## 2021-10-17 DIAGNOSIS — E785 Hyperlipidemia, unspecified: Secondary | ICD-10-CM

## 2021-10-17 DIAGNOSIS — I10 Essential (primary) hypertension: Secondary | ICD-10-CM

## 2021-10-28 ENCOUNTER — Ambulatory Visit: Payer: PPO | Admitting: Pharmacist

## 2021-10-28 DIAGNOSIS — E1165 Type 2 diabetes mellitus with hyperglycemia: Secondary | ICD-10-CM

## 2021-10-28 NOTE — Patient Instructions (Signed)
Visit Information  Thank you for taking time to visit with me today. Please don't hesitate to contact me if I can be of assistance to you before our next scheduled telephone appointment.  Following are the goals we discussed today:   Goals Addressed             This Visit's Progress    Pharmacy Goals       Our goal A1c is less than 7%. This corresponds with fasting sugars less than 130 and 2 hour after meal sugars less than 180. Please check your blood sugars and keep log of results  Our goal bad cholesterol, or LDL, is less than 70 . This is why it is important to continue taking your atorvastatin  Please check your home blood pressure, keep a log of the results and bring this with you to your medical appointments.  Feel free to call me with any questions or concerns. I look forward to our next call!   Wallace Cullens, PharmD, [place] [phone]          Our next appointment is by telephone on 10/30 at 8:30 am  Please call the care guide team at [phone] if you need to cancel or reschedule your appointment.    Patient verbalizes understanding of instructions and care plan provided today and agrees to view in [place]. Active MyChart status and patient understanding of how to access instructions and care plan via MyChart confirmed with patient.

## 2021-10-28 NOTE — Chronic Care Management (AMB) (Signed)
Chronic Care Management CCM Pharmacy Note  10/28/2021 Name:  Summer Young MRN:  [id number] DOB:  May 18, 1950   Subjective: Summer Young is an 71 y.o. year old female who is a primary patient of [name], NP.  The CCM team was consulted for assistance with disease management and care coordination needs.    Engaged with patient by telephone for follow up visit for pharmacy case management and/or care coordination services.   Objective:  Medications Reviewed Today     Reviewed by Rennis Petty, RPH-CPP (Pharmacist) on 10/28/21 at 0844  Med List Status: <None>   Medication Order Taking? Sig Documenting Provider Last Dose Status Informant  acetaminophen (TYLENOL) 650 MG CR tablet [id number]  Take 650 mg by mouth 2 (two) times daily as needed for pain. [provider]  Active   alendronate (FOSAMAX) 70 MG tablet [id number]  Take 1 tablet (70 mg total) by mouth once a week. Take with a full glass of water on an empty stomach. [name], NP  Active   Ascorbic Acid (VITAMIN C) 1000 MG tablet [id number]  Take 1,000 mg by mouth daily as needed.   Patient not taking: Reported on 09/11/2021   [provider]  Active            Med Note [place] C[place], [facility]   Mon Sep 20, 2018  4:05 PM) As needed to prevent illness  atorvastatin (LIPITOR) 40 MG tablet [id number]  Take 1 tablet by mouth once daily [name], NP  Active   Biotin 1000 MCG tablet [id number]  Take 4,000 mcg by mouth every other day.  [provider]  Active Self           Med Note [facility]   Tue Jan 19, 2018 11:31 AM) [name] every other day  blood glucose meter kit and supplies [id number]  Dispense based on patient and insurance preference. Use up to four times daily as directed. (FOR ICD-10 E10.9, E11.9). [name], FNP  Active   Blood Glucose Monitoring Suppl [facility] VERIO) w/Device KIT [id number]  Use to check blood sugar as directed.  DX: E11.65 [name], NP  Active    Cholecalciferol (VITAMIN D3) 50 MCG (2000 UT) capsule [id number]  Take 1,000 Units by mouth daily.  [provider]  Active   Continuous Blood Gluc Sensor (FREESTYLE LIBRE 3 SENSOR) [place] [id number]  Place 1 sensor on the skin every 14 days DX: E11.65 [name], NP  Active   EPINEPHrine 0.3 mg/0.3 mL IJ SOAJ injection [id number]  Inject 0.3 mg into the muscle as needed for anaphylaxis. [name], NP  Active   glucose blood [facility] VERIO) test strip [id number]  Use to check blood sugar as directed  DX: E11.65 [name], NP  Active   insulin degludec (TRESIBA FLEXTOUCH) 100 UNIT/ML FlexTouch Pen [id number] Yes Inject 26 Units into the skin daily.  Patient taking differently: Inject 20 Units into the skin daily.   [name], NP Taking Active   meclizine (ANTIVERT) 25 MG tablet [id number]  Take 1 tablet (25 mg total) by mouth 3 (three) times daily as needed for dizziness.  Patient not taking: Reported on 09/11/2021   [name], NP  Active   metFORMIN (GLUCOPHAGE-XR) 500 MG 24 hr tablet [id number] Yes TAKE 3 TABLETS BY MOUTH ONCE DAILY WITH BREAKFAST [name], NP Taking Active   omeprazole (PRILOSEC) 40 MG capsule [id number]  TAKE 1 CAPSULE  BY MOUTH ONCE DAILY BEFORE BREAKFAST Baity, Coralie Keens, NP  Active   OneTouch Delica Lancets 40N MISC [id number]  Use to check blood sugar as directed.  DX: E11.65 [name], NP  Active   [facility] ULTRA test strip [id number]  Check blood sugar 1 x daily as directed Olin Hauser, DO  Active   Semaglutide, 1 MG/DOSE, (OZEMPIC, 1 MG/DOSE,) 2 MG/1.5ML SOPN [id number] Yes INJECT 1 MG SUBCUTANEOUSLY ONCE A WEEK Baity, Coralie Keens, NP Taking Active   spironolactone (ALDACTONE) 25 MG tablet [id number]  Take 1 tablet by mouth once daily [name], NP  Active   vitamin B-12 (CYANOCOBALAMIN) 1000 MCG tablet [id number]  Take 1,000 mcg by mouth daily.  [provider]  Active             Pertinent Labs:  Lab  Results  Component Value Date   HGBA1C 7.8 (H) 05/29/2021   Lab Results  Component Value Date   CHOL 133 05/29/2021   HDL 50 05/29/2021   LDLCALC 62 05/29/2021   TRIG 130 05/29/2021   CHOLHDL 2.7 05/29/2021   Lab Results  Component Value Date   CREATININE 0.76 07/25/2021   BUN 12 07/25/2021   NA 141 07/25/2021   K 4.2 07/25/2021   CL 105 07/25/2021   CO2 25 07/25/2021    SDOH:  (Social Determinants of Health) assessments and interventions performed:  SDOH Interventions    Flowsheet Row Chronic Care Management from 09/26/2021 in [place] from 07/29/2021 in [facility] Office Visit from 05/29/2021 in [facility] Office Visit from 12/17/2020 in [place] Management from 12/06/2020 in [place] Management from 09/19/2020 in [facility]  SDOH Interventions        Food Insecurity Interventions Intervention Not Indicated Intervention Not Indicated -- -- Intervention Not Indicated --  Housing Interventions Intervention Not Indicated Intervention Not Indicated -- -- Intervention Not Indicated --  Transportation Interventions Intervention Not Indicated -- -- -- Intervention Not Indicated --  Depression Interventions/Treatment  -- PHQ2-9 Score <4 Follow-up Not Indicated PHQ2-9 Score <4 Follow-up Not Indicated PHQ2-9 Score <4 Follow-up Not Indicated -- --  Financial Strain Interventions Intervention Not Indicated Intervention Not Indicated -- -- Other (Comment)  [patient assistance program for medications] --  Physical Activity Interventions -- Intervention Not Indicated -- -- Other (Comments)  [the patient is active and cleans houses, 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] Other (Comments)  [Encourage patient to continue exercise regimen]  Stress Interventions Other (Comment)  [has some different things going on in her life, she handles well]  Intervention Not Indicated -- -- Intervention Not Indicated --  Social Connections Interventions Other (Comment)  [good support system] -- -- -- Other (Comment)  [good support system] --       CCM Care Plan  Review of patient past medical history, allergies, medications, health status, including review of consultants reports, laboratory and other test data, was performed as part of comprehensive evaluation and provision of chronic care management services.   Care Plan : General Pharmacy (Adult)  Updates made by Rennis Petty, RPH-CPP since 10/28/2021 12:00 AM     Problem: Disease Progression      Long-Range Goal: Disease Progression Prevented or Minimized   Start Date: 01/23/2020  Expected End Date: 04/22/2020  This Visit's Progress: On track  Recent Progress: On track  Priority: High  Note:  Current Barriers:  Unable to independently afford treatment regimen Patient APPROVED for [name] and Ozempic medication assistance from [employer] for 2023 calendar year  Pharmacist Clinical Goal(s):  Over the next 90 days, patient will achieve control of T2DM as evidenced by A1C <7.0%. through collaboration with PharmD and provider.  Over the next 90 days, patient will maintain control of LDL as evidenced by LDL <70 mg/dL through collaboration with PharmD and provider.  Over the next 90 days, patient will verbalize ability to afford treatment regimen. through collaboration with PharmD and provider.  Interventions: Inter-disciplinary care team collaboration (see longitudinal plan of care) 1:1 collaboration with [name], NP regarding development and update of comprehensive plan of care as evidenced by provider attestation and co-signature  Type 2 Diabetes: Current treatment: Tresiba 20 units once daily Metformin ER 500 mg - 3 tablets daily with breakfast Ozempic 1 mg once weekly Reports recent fasting morning blood sugar readings ranging 94-101 Denies symptoms of  hypoglycemia Note patient carries glucose tablets with her Patient previously using Freestyle Libre 3 continuous blood glucose monitor (CGM), but recently off of CGM Reports planning to restart using CGM today as she is back from traveling Encourage patient to again use data from CGM as feedback on dietary choices/habits to aid with glucose control Reports doing well with having regular well-balanced meals, including a controlled portion size  Medication Assistance: Reports has sufficient supply of Tresiba and Ozempic from assistance program to last until next calendar year Schedule appointment to initiate program re-enrollment for 2024 calendar year  Patient Goals/Self-Care Activities Over the next 90 days, patient will:  Take medications as prescribed Check blood glucose daily, document, and provide at future appointments Keeps log in App on phone Collaborate with provider on medication access solutions Attends all scheduled provider appointments Calls pharmacy for medication refills Calls provider office for new concerns or questions  Follow Up Plan: Telephone follow up appointment with care management team member scheduled for: 12/16/2021 at 8:30 am      Wallace Cullens, PharmD, [place] [phone]

## 2021-12-02 ENCOUNTER — Ambulatory Visit (INDEPENDENT_AMBULATORY_CARE_PROVIDER_SITE_OTHER): Payer: PPO | Admitting: Internal Medicine

## 2021-12-02 ENCOUNTER — Encounter: Payer: Self-pay | Admitting: Internal Medicine

## 2021-12-02 VITALS — BP 128/76 | HR 88 | Temp 96.5°F | Wt 158.0 lb

## 2021-12-02 DIAGNOSIS — Z794 Long term (current) use of insulin: Secondary | ICD-10-CM | POA: Diagnosis not present

## 2021-12-02 DIAGNOSIS — Z6828 Body mass index (BMI) 28.0-28.9, adult: Secondary | ICD-10-CM

## 2021-12-02 DIAGNOSIS — E1165 Type 2 diabetes mellitus with hyperglycemia: Secondary | ICD-10-CM

## 2021-12-02 DIAGNOSIS — M545 Low back pain, unspecified: Secondary | ICD-10-CM

## 2021-12-02 DIAGNOSIS — E663 Overweight: Secondary | ICD-10-CM

## 2021-12-02 DIAGNOSIS — E1169 Type 2 diabetes mellitus with other specified complication: Secondary | ICD-10-CM

## 2021-12-02 DIAGNOSIS — K219 Gastro-esophageal reflux disease without esophagitis: Secondary | ICD-10-CM | POA: Diagnosis not present

## 2021-12-02 DIAGNOSIS — I10 Essential (primary) hypertension: Secondary | ICD-10-CM | POA: Diagnosis not present

## 2021-12-02 DIAGNOSIS — E785 Hyperlipidemia, unspecified: Secondary | ICD-10-CM

## 2021-12-02 DIAGNOSIS — M810 Age-related osteoporosis without current pathological fracture: Secondary | ICD-10-CM

## 2021-12-02 DIAGNOSIS — M25561 Pain in right knee: Secondary | ICD-10-CM

## 2021-12-02 DIAGNOSIS — G8929 Other chronic pain: Secondary | ICD-10-CM

## 2021-12-02 DIAGNOSIS — M25562 Pain in left knee: Secondary | ICD-10-CM

## 2021-12-02 LAB — POCT GLYCOSYLATED HEMOGLOBIN (HGB A1C): Hemoglobin A1C: 7.2 % — AB (ref 4.0–5.6)

## 2021-12-02 NOTE — Patient Instructions (Signed)

## 2021-12-02 NOTE — Assessment & Plan Note (Signed)
Encourage diet and exercise weight loss 

## 2021-12-02 NOTE — Assessment & Plan Note (Signed)
Encourage regular stretching and core strengthening Continue Tylenol and Voltaren gel as needed

## 2021-12-02 NOTE — Assessment & Plan Note (Signed)
Encouraged her to avoid foods that trigger reflux Encourage weight loss as this can help reduce reflux symptoms Continue omeprazole as needed

## 2021-12-02 NOTE — Assessment & Plan Note (Signed)
C-Met and lipid profile today Encouraged her to consume a low-fat diet Continue atorvastatin 

## 2021-12-02 NOTE — Assessment & Plan Note (Signed)
Continue Fosamax, calcium and vitamin D Encourage daily weightbearing exercise

## 2021-12-02 NOTE — Assessment & Plan Note (Signed)
Encourage weight loss as this can help reduce joint pain Continue Tylenol and Voltaren gel as needed

## 2021-12-02 NOTE — Assessment & Plan Note (Signed)
POCT A1c 7.2% We will check urine microalbumin Encouraged her to consume a low-carb diet and exercise for weight loss Continue metformin, Ozempic and [place] Encourage routine eye exam, will request copy Encouraged foot exams She declines flu shots Pneumovax and Prevnar UTD Encouraged her to get her COVID booster

## 2021-12-02 NOTE — Progress Notes (Signed)
Subjective:    Patient ID: Summer Young, female    DOB: 03-18-1950, 71 y.o.   MRN: [id number]  HPI  Patient presents the clinic today for follow-up of chronic conditions.  HTN: Her BP today is 128/76.  She is taking Spironolactone as prescribed.  ECG from 08/2018 reviewed.  HLD: Her last LDL was 62, triglycerides 130, 05/2021.  She denies myalgias on Atorvastatin.  She tries to consume a low-fat diet.  DM2: Her last A1c was 7.8%, 05/2021.  She is taking Metformin, Ozempic and [place] as prescribed.  Her sugars range 65-200.  She checks her feet routinely.  Her last eye exam was 09/2021.  Flu never.  Pneumovax 09/2020.  Prevnar 11/2017.  Maxwell x3.  Osteoporosis: Managed with Fosamax as prescribed.  She tries to get weightbearing exercise daily.  Bone density from 07/2019 reviewed.  OA: Mainly in her back, hips and knees.  She is taking Tylenol and using Voltaren gel as prescribed.  She does not follow with orthopedics.  GERD: Triggered by spicy foods.  She takes Omeprazole as needed.  There is no upper GI on file.  Review of Systems     Past Medical History:  Diagnosis Date   Allergy    Chronic pain of both knees    Diabetes mellitus without complication (HCC)    Hyperlipidemia    Hypertension     Current Outpatient Medications  Medication Sig Dispense Refill   acetaminophen (TYLENOL) 650 MG CR tablet Take 650 mg by mouth 2 (two) times daily as needed for pain.     alendronate (FOSAMAX) 70 MG tablet Take 1 tablet (70 mg total) by mouth once a week. Take with a full glass of water on an empty stomach. 12 tablet 0   Ascorbic Acid (VITAMIN C) 1000 MG tablet Take 1,000 mg by mouth daily as needed.  (Patient not taking: Reported on 09/11/2021)     atorvastatin (LIPITOR) 40 MG tablet Take 1 tablet by mouth once daily 90 tablet 0   Biotin 1000 MCG tablet Take 4,000 mcg by mouth every other day.      blood glucose meter kit and supplies Dispense based on patient and insurance  preference. Use up to four times daily as directed. (FOR ICD-10 E10.9, E11.9). 1 each 0   Blood Glucose Monitoring Suppl (ONETOUCH VERIO) w/Device KIT Use to check blood sugar as directed.  DX: E11.65 1 kit 0   Cholecalciferol (VITAMIN D3) 50 MCG (2000 UT) capsule Take 1,000 Units by mouth daily.      Continuous Blood Gluc Sensor (FREESTYLE LIBRE 3 SENSOR) MISC Place 1 sensor on the skin every 14 days DX: E11.65 2 each 12   EPINEPHrine 0.3 mg/0.3 mL IJ SOAJ injection Inject 0.3 mg into the muscle as needed for anaphylaxis. 1 each 1   glucose blood (ONETOUCH VERIO) test strip Use to check blood sugar as directed  DX: E11.65 100 each 1   insulin degludec (TRESIBA FLEXTOUCH) 100 UNIT/ML FlexTouch Pen Inject 26 Units into the skin daily. (Patient taking differently: Inject 20 Units into the skin daily.) 15 mL 0   meclizine (ANTIVERT) 25 MG tablet Take 1 tablet (25 mg total) by mouth 3 (three) times daily as needed for dizziness. (Patient not taking: Reported on 09/11/2021) 30 tablet 1   metFORMIN (GLUCOPHAGE-XR) 500 MG 24 hr tablet TAKE 3 TABLETS BY MOUTH ONCE DAILY WITH BREAKFAST 270 tablet 0   omeprazole (PRILOSEC) 40 MG capsule TAKE 1 CAPSULE BY MOUTH ONCE DAILY  BEFORE BREAKFAST 90 capsule 1   OneTouch Delica Lancets 88K MISC Use to check blood sugar as directed.  DX: E11.65 100 each 1   ONETOUCH ULTRA test strip Check blood sugar 1 x daily as directed 100 each 12   Semaglutide, 1 MG/DOSE, (OZEMPIC, 1 MG/DOSE,) 2 MG/1.5ML SOPN INJECT 1 MG SUBCUTANEOUSLY ONCE A WEEK 4 mL 0   spironolactone (ALDACTONE) 25 MG tablet Take 1 tablet by mouth once daily 90 tablet 0   vitamin B-12 (CYANOCOBALAMIN) 1000 MCG tablet Take 1,000 mcg by mouth daily.      No current facility-administered medications for this visit.    Allergies  Allergen Reactions   Aspirin Hives    Other reaction(s): Other (See Comments) Other Reaction: Not Assessed    Insulin Detemir Hives and Rash    Errythema, edema, heat at site of  injection did not improve after 2 weeks of use.     Shellfish Allergy Anaphylaxis   Dapagliflozin Other (See Comments)    Yeast infections Yeast infection Farxiga   Influenza Vaccines Other (See Comments)    Forxiga   Other     Other reaction(s): Other (See Comments) Uncoded Allergy. Allergen: Shellfish, Other Reaction: Not Assessed    Flu vaccine given 1998 caused anaphylaxis    Family History  Problem Relation Age of Onset   Diabetes Mother    Diabetes Father    Stroke Father    Diabetes Sister    Heart attack Other    Diabetes Sister    Diabetes Sister    Diabetes Sister     Social History   Socioeconomic History   Marital status: Married    Spouse name: Reynoso,Eduardo   Number of children: Not on file   Years of education: Not on file   Highest education level: High school graduate  Occupational History   Occupation: housekeeper    Comment: self-employed   Occupation: Self- Employed  Tobacco Use   Smoking status: Never   Smokeless tobacco: Never  Vaping Use   Vaping Use: Never used  Substance and Sexual Activity   Alcohol use: Yes    Alcohol/week: 0.0 - 1.0 standard drinks of alcohol    Comment: occasional   Drug use: Never   Sexual activity: Yes    Birth control/protection: None  Other Topics Concern   Not on file  Social History Narrative   Not on file   Social Determinants of Health   Financial Resource Strain: Low Risk  (09/26/2021)   Overall Financial Resource Strain (CARDIA)    Difficulty of Paying Living Expenses: Not very hard  Food Insecurity: No Food Insecurity (09/26/2021)   Hunger Vital Sign    Worried About Running Out of Food in the Last Year: Never true    Ran Out of Food in the Last Year: Never true  Transportation Needs: No Transportation Needs (09/26/2021)   PRAPARE - Hydrologist (Medical): No    Lack of Transportation (Non-Medical): No  Physical Activity: Insufficiently Active (07/29/2021)   Exercise  Vital Sign    Days of Exercise per Week: 2 days    Minutes of Exercise per Session: 20 min  Stress: Stress Concern Present (09/26/2021)   Home    Feeling of Stress : To some extent  Social Connections: Moderately Isolated (09/26/2021)   Social Connection and Isolation Panel [NHANES]    Frequency of Communication with Friends and Family: More than three times  a week    Frequency of Social Gatherings with Friends and Family: More than three times a week    Attends Religious Services: Never    Database administrator or Organizations: No    Attends Banker Meetings: Never    Marital Status: Married  Catering manager Violence: Not At Risk (09/26/2021)   Humiliation, Afraid, Rape, and Kick questionnaire    Fear of Current or Ex-Partner: No    Emotionally Abused: No    Physically Abused: No    Sexually Abused: No     Constitutional: Denies fever, malaise, fatigue, headache or abrupt weight changes.  HEENT: Denies eye pain, eye redness, ear pain, ringing in the ears, wax buildup, runny nose, nasal congestion, bloody nose, or sore throat. Respiratory: Denies difficulty breathing, shortness of breath, cough or sputum production.   Cardiovascular: Denies chest pain, chest tightness, palpitations or swelling in the hands or feet.  Gastrointestinal: Patient reports intermittent reflux.  Denies abdominal pain, bloating, constipation, diarrhea or blood in the stool.  GU: Denies urgency, frequency, pain with urination, burning sensation, blood in urine, odor or discharge. Musculoskeletal: Patient reports joint pain.  Denies decrease in range of motion, difficulty with gait, muscle pain or joint swelling.  Skin: Denies redness, rashes, lesions or ulcercations.  Neurological: Denies dizziness, difficulty with memory, difficulty with speech or problems with balance and coordination.  Psych: Denies anxiety, depression,  SI/HI.  No other specific complaints in a complete review of systems (except as listed in HPI above).  Objective:   Physical Exam  BP 128/76 (BP Location: Right Arm, Patient Position: Sitting, Cuff Size: Normal)   Pulse 88   Temp (!) 96.5 F (35.8 C) (Temporal)   Wt 158 lb (71.7 kg)   SpO2 100%   BMI 28.90 kg/m   Wt Readings from Last 3 Encounters:  07/25/21 161 lb (73 kg)  05/29/21 162 lb (73.5 kg)  03/15/21 159 lb 12.8 oz (72.5 kg)    General: Appears her stated age, overweight, in NAD. Skin: Warm, dry and intact. No ulcerations noted. HEENT: Head: normal shape and size; Eyes: sclera white, no icterus, conjunctiva pink, PERRLA and EOMs intact;  Cardiovascular: Normal rate and rhythm. S1,S2 noted.  No murmur, rubs or gallops noted. No JVD or BLE edema. No carotid bruits noted. Pulmonary/Chest: Normal effort and positive vesicular breath sounds. No respiratory distress. No wheezes, rales or ronchi noted.  Abdomen: Soft and nontender. Normal bowel sounds.  Musculoskeletal: No difficulty with gait.  Neurological: Alert and oriented.  Psychiatric: Mood and affect normal. Behavior is normal. Judgment and thought content normal.   BMET    Component Value Date/Time   NA 141 07/25/2021 1554   K 4.2 07/25/2021 1554   CL 105 07/25/2021 1554   CO2 25 07/25/2021 1554   GLUCOSE 132 07/25/2021 1554   BUN 12 07/25/2021 1554   CREATININE 0.76 07/25/2021 1554   CALCIUM 9.6 07/25/2021 1554   GFRNONAA 79 06/10/2018 0824   GFRAA 91 06/10/2018 0824    Lipid Panel     Component Value Date/Time   CHOL 133 05/29/2021 0832   TRIG 130 05/29/2021 0832   HDL 50 05/29/2021 0832   CHOLHDL 2.7 05/29/2021 0832   LDLCALC 62 05/29/2021 0832    CBC    Component Value Date/Time   WBC 9.5 07/25/2021 1554   RBC 4.84 07/25/2021 1554   HGB 13.3 07/25/2021 1554   HCT 40.8 07/25/2021 1554   PLT 372 07/25/2021 1554  MCV 84.3 07/25/2021 1554   MCH 27.5 07/25/2021 1554   MCHC 32.6 07/25/2021  1554   RDW 13.5 07/25/2021 1554   LYMPHSABS 2,967 04/12/2019 0818   EOSABS 95 04/12/2019 0818   BASOSABS 34 04/12/2019 0818    Hgb A1C Lab Results  Component Value Date   HGBA1C 7.8 (H) 05/29/2021            Assessment & Plan:    RTC in 6 months for your annual exam Webb Silversmith, NP

## 2021-12-02 NOTE — Assessment & Plan Note (Signed)
Controlled on spironolactone Reinforced DASH diet and exercise for weight loss C-Met today

## 2021-12-03 LAB — COMPLETE METABOLIC PANEL WITH GFR
AG Ratio: 1.5 (calc) (ref 1.0–2.5)
ALT: 15 U/L (ref 6–29)
AST: 15 U/L (ref 10–35)
Albumin: 4.3 g/dL (ref 3.6–5.1)
Alkaline phosphatase (APISO): 102 U/L (ref 37–153)
BUN: 14 mg/dL (ref 7–25)
CO2: 24 mmol/L (ref 20–32)
Calcium: 9.3 mg/dL (ref 8.6–10.4)
Chloride: 105 mmol/L (ref 98–110)
Creat: 0.71 mg/dL (ref 0.50–1.05)
Globulin: 2.8 g/dL (calc) (ref 1.9–3.7)
Glucose, Bld: 88 mg/dL (ref 65–99)
Potassium: 4.2 mmol/L (ref 3.5–5.3)
Sodium: 140 mmol/L (ref 135–146)
Total Bilirubin: 0.5 mg/dL (ref 0.2–1.2)
Total Protein: 7.1 g/dL (ref 6.1–8.1)
eGFR: 92 mL/min/{1.73_m2} (ref >=60)

## 2021-12-03 LAB — LIPID PANEL
Cholesterol: 111 mg/dL (ref <200)
HDL: 50 mg/dL (ref >=50)
LDL Cholesterol (Calc): 46 mg/dL (calc)
Non-HDL Cholesterol (Calc): 61 mg/dL (calc) (ref <130)
Total CHOL/HDL Ratio: 2.2 (calc) (ref <5.0)
Triglycerides: 72 mg/dL (ref <150)

## 2021-12-03 LAB — MICROALBUMIN / CREATININE URINE RATIO
Creatinine, Urine: 34 mg/dL (ref 20–275)
Microalb, Ur: <0.2 mg/dL

## 2021-12-04 ENCOUNTER — Other Ambulatory Visit: Payer: Self-pay

## 2021-12-04 DIAGNOSIS — M810 Age-related osteoporosis without current pathological fracture: Secondary | ICD-10-CM

## 2021-12-04 MED ORDER — ALENDRONATE SODIUM 70 MG PO TABS: 70.0000 mg | ORAL_TABLET | ORAL | 1 refills | Status: DC

## 2021-12-10 ENCOUNTER — Other Ambulatory Visit: Payer: Self-pay | Admitting: Internal Medicine

## 2021-12-10 DIAGNOSIS — E1169 Type 2 diabetes mellitus with other specified complication: Secondary | ICD-10-CM

## 2021-12-10 DIAGNOSIS — E1165 Type 2 diabetes mellitus with hyperglycemia: Secondary | ICD-10-CM

## 2021-12-11 NOTE — Telephone Encounter (Signed)
Requested Prescriptions  Pending Prescriptions Disp Refills  . atorvastatin (LIPITOR) 40 MG tablet [Pharmacy Med Name: Atorvastatin Calcium 40 MG Oral Tablet] 90 tablet 3    Sig: Take 1 tablet by mouth once daily     Cardiovascular:  Antilipid - Statins Failed - 12/10/2021  8:48 AM      Failed - Lipid Panel in normal range within the last 12 months    Cholesterol  Date Value Ref Range Status  12/02/2021 111 <200 mg/dL Final   LDL Cholesterol (Calc)  Date Value Ref Range Status  12/02/2021 46 mg/dL (calc) Final    Comment:    Reference range: <100 . Desirable range <100 mg/dL for primary prevention;   <70 mg/dL for patients with CHD or diabetic patients  with > or = 2 CHD risk factors. Marland Kitchen LDL-C is now calculated using the Martin-Hopkins  calculation, which is a validated novel method providing  better accuracy than the Friedewald equation in the  estimation of LDL-C.  Cresenciano Genre et al. Annamaria Helling. 7412;878(67): 2061-2068  (http://education.[facility].com/faq/FAQ164)    HDL  Date Value Ref Range Status  12/02/2021 50 > OR = 50 mg/dL Final   Triglycerides  Date Value Ref Range Status  12/02/2021 72 <150 mg/dL Final         Passed - Patient is not pregnant      Passed - Valid encounter within last 12 months    Recent Outpatient Visits          1 week ago Type 2 diabetes mellitus with hyperglycemia, with long-term current use of insulin [facility])   [facility], Coralie Keens, NP   4 months ago [facility] [place], [place] W, NP   6 months ago Type 2 diabetes mellitus with hyperglycemia, without long-term current use of insulin [facility])   [facility], Coralie Keens, NP   9 months ago Gastroesophageal reflux disease without esophagitis   [place], DO   11 months ago Encounter for general adult medical examination with abnormal findings   [facility],  Coralie Keens, NP      Future Appointments            In 5 months Baity, Coralie Keens, NP [facility], [facility]

## 2021-12-16 ENCOUNTER — Ambulatory Visit: Payer: PPO | Admitting: Pharmacist

## 2021-12-16 ENCOUNTER — Telehealth: Payer: Self-pay | Admitting: Pharmacist

## 2021-12-16 DIAGNOSIS — E1165 Type 2 diabetes mellitus with hyperglycemia: Secondary | ICD-10-CM

## 2021-12-16 DIAGNOSIS — E1169 Type 2 diabetes mellitus with other specified complication: Secondary | ICD-10-CM

## 2021-12-16 MED ORDER — METFORMIN HCL ER 500 MG PO TB24: ORAL_TABLET | ORAL | 0 refills | Status: DC

## 2021-12-16 NOTE — Addendum Note (Signed)
Addended by: [name] on: 12/16/2021 09:27 AM   Modules accepted: Orders

## 2021-12-16 NOTE — Patient Instructions (Signed)
Goals Addressed             This Visit's Progress    Pharmacy Goals       Our goal A1c is less than 7%. This corresponds with fasting sugars less than 130 and 2 hour after meal sugars less than 180. Please check your blood sugars and keep log of results  Our goal bad cholesterol, or LDL, is less than 70 . This is why it is important to continue taking your atorvastatin  Please check your home blood pressure, keep a log of the results and bring this with you to your medical appointments.  Feel free to call me with any questions or concerns. I look forward to our next call!  Wallace Cullens, PharmD, [place] [phone]

## 2021-12-16 NOTE — Progress Notes (Signed)
12/16/2021 Name: Summer Young MRN: [id number] DOB: [birth year]  Chief Complaint  Patient presents with   Medication Assistance   Medication Management    Summer Young is a 71 y.o. year old female who presented for a telephone visit.   They were referred to the pharmacist by their PCP for assistance in managing diabetes, hypertension, hyperlipidemia, and medication access.    Subjective:  Care Team: Primary Care Provider: [name], NP ; Next Scheduled Visit: 06/04/2022  Medication Access/Adherence  Current Pharmacy:  [facility] [address] [place] [address] Phone: [phone] Fax: [phone]   Patient reports affordability concerns with their medications: No  - Patient currently enrolled in assistance programs for [place] and Ozempic Patient reports access/transportation concerns to their pharmacy: No  Patient reports adherence concerns with their medications:  No     Type 2 Diabetes:  Current medications:  Tresiba 20 units once daily Metformin ER 500 mg - 3 tablets daily with breakfast Ozempic 1 mg once weekly  Patient using Freestyle Libre 3 continuous blood glucose monitor Using her iPhone in place of reader devices Review results from Freestyle LibreView:    Patient denies hypoglycemic s/sx including dizziness, shakiness, sweating. Note patient carries glucose tablets with her  Reports doing well with having regular well-balanced meals, including a controlled portion size   Current physical activity: limited to activity at work  Statin: atorvastatin 40 mg daily  Reports received her annual eye exam in August  Current medication access support: Enrolled in [place] and [place] medication assistance from [employer] for 2023 calendar year - Reports currently has sufficient supply of Tresiba and Ozempic to last until end of 2023 calendar year - Patient interested in assistance with re-enrollment  in program for [place] Maintenance Due  Topic Date Due   Zoster Vaccines- Shingrix (2 of 2) 12/12/2020   FOOT EXAM  08/27/2021   OPHTHALMOLOGY EXAM  10/15/2021     Objective: Lab Results  Component Value Date   HGBA1C 7.2 (A) 12/02/2021    Lab Results  Component Value Date   CREATININE 0.71 12/02/2021   BUN 14 12/02/2021   NA 140 12/02/2021   K 4.2 12/02/2021   CL 105 12/02/2021   CO2 24 12/02/2021    Lab Results  Component Value Date   CHOL 111 12/02/2021   HDL 50 12/02/2021   LDLCALC 46 12/02/2021   TRIG 72 12/02/2021   CHOLHDL 2.2 12/02/2021    Medications Reviewed Today     Reviewed by [name], NP (Nurse Practitioner) on 12/02/21 at [place] Status: <None>   Medication Order Taking? Sig Documenting Provider Last Dose Status Informant  acetaminophen (TYLENOL) 650 MG CR tablet [id number] Yes Take 650 mg by mouth 2 (two) times daily as needed for pain. [provider] Taking Active   alendronate (FOSAMAX) 70 MG tablet [id number] Yes Take 1 tablet (70 mg total) by mouth once a week. Take with a full glass of water on an empty stomach. [name], NP Taking Active   Ascorbic Acid (VITAMIN C) 1000 MG tablet [id number]  Take 1,000 mg by mouth daily as needed.   Patient not taking: Reported on 09/11/2021   [provider]  Active            Med Note [place], [facility]   Mon Sep 20, 2018  4:05 PM) As needed to prevent illness  atorvastatin (LIPITOR) 40 MG tablet [id number] Yes Take 1 tablet by mouth once daily [name], NP Taking Active   Biotin 1000 MCG tablet [id number] No Take 4,000 mcg by mouth every other day.   Patient not taking: Reported on 12/02/2021   [provider] Not Taking Consider Medication Status and Discontinue (Patient Preference) Self           Med Note Broad[place] John, Trude Mcburney   Tue Jan 19, 2018 11:31 AM) [name] every other day  blood glucose meter kit and supplies [id number] Yes  Dispense based on patient and insurance preference. Use up to four times daily as directed. (FOR ICD-10 E10.9, E11.9). [name], FNP Taking Active   Blood Glucose Monitoring Suppl [facility] VERIO) w/Device KIT [id number] Yes Use to check blood sugar as directed.  DX: E11.65 [name], NP Taking Active   Cholecalciferol (VITAMIN D3) 50 MCG (2000 UT) capsule [id number] Yes Take 1,000 Units by mouth daily.  [provider] Taking Active   Continuous Blood Gluc Sensor (FREESTYLE LIBRE 3 SENSOR) [place] [id number] Yes Place 1 sensor on the skin every 14 days DX: E11.65 [name], NP Taking Active   EPINEPHrine 0.3 mg/0.3 mL IJ SOAJ injection [id number] Yes Inject 0.3 mg into the muscle as needed for anaphylaxis. [name], NP Taking Active   glucose blood [facility] VERIO) test strip [id number] Yes Use to check blood sugar as directed  DX: E11.65 [name], NP Taking Active   insulin degludec (TRESIBA FLEXTOUCH) 100 UNIT/ML FlexTouch Pen [id number] Yes Inject 26 Units into the skin daily.  Patient taking differently: Inject 20 Units into the skin daily.   [name], NP Taking Active   meclizine (ANTIVERT) 25 MG tablet [id number] No Take 1 tablet (25 mg total) by mouth 3 (three) times daily as needed for dizziness.  Patient not taking: Reported on 12/02/2021   [name], NP Not Taking Consider Medication Status and Discontinue (Completed Course)   metFORMIN (GLUCOPHAGE-XR) 500 MG 24 hr tablet [id number] Yes TAKE 3 TABLETS BY MOUTH ONCE DAILY WITH BREAKFAST [name], NP Taking Active   omeprazole (PRILOSEC) 40 MG capsule [id number] Yes TAKE 1 CAPSULE BY MOUTH ONCE DAILY BEFORE BREAKFAST [name], NP Taking Active   OneTouch Delica Lancets 17E [place] [id number]  Use to check blood sugar as directed.  DX: E11.65 [name], NP  Active   [facility] ULTRA test strip [id number]  Check blood sugar 1 x daily as directed Olin Hauser, DO  Active    Semaglutide, 1 MG/DOSE, (OZEMPIC, 1 MG/DOSE,) 2 MG/1.5ML SOPN [id number] Yes INJECT 1 MG SUBCUTANEOUSLY ONCE A WEEK Baity, Coralie Keens, NP Taking Active   spironolactone (ALDACTONE) 25 MG tablet [id number] Yes Take 1 tablet by mouth once daily [name], NP Taking Active   vitamin B-12 (CYANOCOBALAMIN) 1000 MCG tablet [id number] Yes Take 1,000 mcg by mouth daily.  [provider] Taking Active               Assessment/Plan:   Health Maintenance: -Encourage patient to follow up with pharmacy to obtain her complete Shingrix vaccine series  Diabetes: - Currently controlled - Encourage patient to continue having regular well-balanced meals, while controlling carbohydrate portion sizes - Patient to continue to use CGM as feedback on dietary choices/habits to aid with glucose control - Encourage patient to restart exercising through her gym or previous home exercise - Patient requesting renewal of metformin ER 500 mg  Rx  Will send renewal request to clinical team - Meets financial criteria for re-enrollment in Ozempic and [place] patient assistance program through [employer]. Will collaborate with provider, CPhT, and patient to pursue assistance.     Follow Up Plan: Clinic Pharmacist will follow up with patient by telephone again on 01/20/2022 at 8:30 AM  Wallace Cullens, PharmD, [facility] [phone]

## 2021-12-16 NOTE — Telephone Encounter (Signed)
Patient requesting renewal of metformin ER 500 mg Rx. Would you please send a renewal to patient's [place]?  Thank you!  Grayland Ormond

## 2021-12-16 NOTE — Progress Notes (Signed)
This encounter was created in error - please disregard.

## 2021-12-19 ENCOUNTER — Telehealth: Payer: Self-pay | Admitting: Pharmacy Technician

## 2021-12-19 DIAGNOSIS — Z596 Low income: Secondary | ICD-10-CM

## 2021-12-19 NOTE — Progress Notes (Addendum)
[facility])                                            Hope Team    12/19/2021  Summer Young [birth year] [id number]                                       Medication Assistance Referral-FOR 2024 RE ENROLLMENT  Referral From: [facility] Embedded RPh Dorthula Perfect   Medication/Company: [name] / [employer] Patient application portion:  Education officer, museum portion: Faxed  to Webb Silversmith, NP Provider address/fax verified via: Office website  Medication/Company: [name] / [employer] Patient application portion:  Education officer, museum portion: Faxed  to Webb Silversmith, NP Provider address/fax verified via: [employer]. [name], [place]  [phone]

## 2021-12-25 ENCOUNTER — Ambulatory Visit: Payer: PPO | Admitting: Pharmacist

## 2021-12-25 DIAGNOSIS — E1165 Type 2 diabetes mellitus with hyperglycemia: Secondary | ICD-10-CM

## 2021-12-25 NOTE — Chronic Care Management (AMB) (Signed)
12/25/2021 Name: Summer Young MRN: [id number] DOB: 07/28/1950  Chief Complaint  Patient presents with   Medication Assistance   Receive a voicemail from patient requesting call back  Outreach to patient today by telephone   They were referred to the pharmacist by their PCP for assistance in managing diabetes, hypertension, hyperlipidemia, and medication access.    Subjective:  Care Team: Primary Care Provider: [name], NP ; Next Scheduled Visit: 06/04/2022   Medication Access/Adherence  Current Pharmacy:  [facility] [address] [place] [address] Phone: [phone] Fax: [phone]   Patient reports affordability concerns with their medications: No  - Patient currently enrolled in assistance programs for [place] and Ozempic Patient reports access/transportation concerns to their pharmacy: No  Patient reports adherence concerns with their medications:  No    Today patient requesting assistance with questions regarding documentation necessary for re-enrollment in [place] patient assistance program for 2024 - Patient's questions addressed   Objective: Lab Results  Component Value Date   HGBA1C 7.2 (A) 12/02/2021    Lab Results  Component Value Date   CREATININE 0.71 12/02/2021   BUN 14 12/02/2021   NA 140 12/02/2021   K 4.2 12/02/2021   CL 105 12/02/2021   CO2 24 12/02/2021    Lab Results  Component Value Date   CHOL 111 12/02/2021   HDL 50 12/02/2021   LDLCALC 46 12/02/2021   TRIG 72 12/02/2021   CHOLHDL 2.2 12/02/2021    Medications Reviewed Today     Reviewed by [name], NP (Nurse Practitioner) on 12/02/21 at [place] List Status: <None>   Medication Order Taking? Sig Documenting Provider Last Dose Status Informant  acetaminophen (TYLENOL) 650 MG CR tablet [id number] Yes Take 650 mg by mouth 2 (two) times daily as needed for pain. [provider] Taking Active    alendronate (FOSAMAX) 70 MG tablet [id number] Yes Take 1 tablet (70 mg total) by mouth once a week. Take with a full glass of water on an empty stomach. [name], NP Taking Active   Ascorbic Acid (VITAMIN C) 1000 MG tablet [id number]  Take 1,000 mg by mouth daily as needed.   Patient not taking: Reported on 09/11/2021   [provider]  Active            Med Note [place] Cun[place], [place] [facility] [place] [facility]   Mon Sep 20, 2018  4:05 PM) As needed to prevent illness  atorvastatin (LIPITOR) 40 MG tablet [id number] Yes Take 1 tablet by mouth once daily [name], NP Taking Active   Biotin 1000 MCG tablet [id number] No Take 4,000 mcg by mouth every other day.   Patient not taking: Reported on 12/02/2021   [provider] Not Taking Consider Medication Status and Discontinue (Patient Preference) Self           Med Note Broadus John, Trude Mcburney   Tue Jan 19, 2018 11:31 AM) [name] every other day  blood glucose meter kit and supplies [id number] Yes Dispense based on patient and insurance preference. Use up to four times daily as directed. (FOR ICD-10 E10.9, E11.9). [name], FNP Taking Active   Blood Glucose Monitoring Suppl [facility] VERIO) w/Device KIT [id number] Yes Use to check blood sugar as directed.  DX: E11.65 [name], NP Taking Active   Cholecalciferol (VITAMIN D3) 50 MCG (2000 UT) capsule [id number] Yes Take 1,000 Units by mouth daily.  [provider] Taking Active  Continuous Blood Gluc Sensor (FREESTYLE LIBRE 3 SENSOR) [place] [id number] Yes Place 1 sensor on the skin every 14 days DX: E11.65 [name], NP Taking Active   EPINEPHrine 0.3 mg/0.3 mL IJ SOAJ injection [id number] Yes Inject 0.3 mg into the muscle as needed for anaphylaxis. [name], NP Taking Active   glucose blood [facility] VERIO) test strip [id number] Yes Use to check blood sugar as directed  DX: E11.65 [name], NP Taking Active   insulin degludec (TRESIBA FLEXTOUCH) 100 UNIT/ML FlexTouch Pen  [id number] Yes Inject 26 Units into the skin daily.  Patient taking differently: Inject 20 Units into the skin daily.   [name], NP Taking Active   meclizine (ANTIVERT) 25 MG tablet [id number] No Take 1 tablet (25 mg total) by mouth 3 (three) times daily as needed for dizziness.  Patient not taking: Reported on 12/02/2021   [name], NP Not Taking Consider Medication Status and Discontinue (Completed Course)   metFORMIN (GLUCOPHAGE-XR) 500 MG 24 hr tablet [id number] Yes TAKE 3 TABLETS BY MOUTH ONCE DAILY WITH BREAKFAST [name], NP Taking Active   omeprazole (PRILOSEC) 40 MG capsule [id number] Yes TAKE 1 CAPSULE BY MOUTH ONCE DAILY BEFORE BREAKFAST [name], NP Taking Active   OneTouch Delica Lancets 18H [place] [id number]  Use to check blood sugar as directed.  DX: E11.65 [name], NP  Active   [facility] ULTRA test strip [id number]  Check blood sugar 1 x daily as directed Olin Hauser, DO  Active   Semaglutide, 1 MG/DOSE, (OZEMPIC, 1 MG/DOSE,) 2 MG/1.5ML SOPN [id number] Yes INJECT 1 MG SUBCUTANEOUSLY ONCE A WEEK Baity, Coralie Keens, NP Taking Active   spironolactone (ALDACTONE) 25 MG tablet [id number] Yes Take 1 tablet by mouth once daily [name], NP Taking Active   vitamin B-12 (CYANOCOBALAMIN) 1000 MCG tablet [id number] Yes Take 1,000 mcg by mouth daily.  [provider] Taking Active               Assessment/Plan:   Patient requests referral to Clinical Social Worker for help with finding social support for recent life stress  - Will send referral message to Clinical Social Worker  Diabetes: - Currently controlled - Meets financial criteria for re-enrollment in [place] and [place] patient assistance program through [employer]. Collaborating with provider, CPhT, and patient to pursue renewal in assistance program for 2024       Follow Up Plan: Clinic Pharmacist will follow up with patient by telephone again on 01/20/2022 at 8:30  AM   Wallace Cullens, PharmD, [place] [phone]

## 2021-12-25 NOTE — Patient Instructions (Signed)
Goals Addressed             This Visit's Progress    Pharmacy Goals       Our goal A1c is less than 7%. This corresponds with fasting sugars less than 130 and 2 hour after meal sugars less than 180. Please check your blood sugars and keep log of results  Our goal bad cholesterol, or LDL, is less than 70 . This is why it is important to continue taking your atorvastatin  Please check your home blood pressure, keep a log of the results and bring this with you to your medical appointments.  Feel free to call me with any questions or concerns. I look forward to our next call!   Wallace Cullens, PharmD, [place] [phone]

## 2021-12-31 IMAGING — DX DG LUMBAR SPINE COMPLETE 4+V
5 series · 5 of 5 positions shown · non-contrast
Comparison: Report from lumbar spine radiographs 07/10/2015 (images
unavailable).

CLINICAL DATA: Chronic bilateral low back pain, without sciatica.

EXAM:
LUMBAR SPINE - COMPLETE 4+ VIEW

[l-spine obl (1 of 2)]
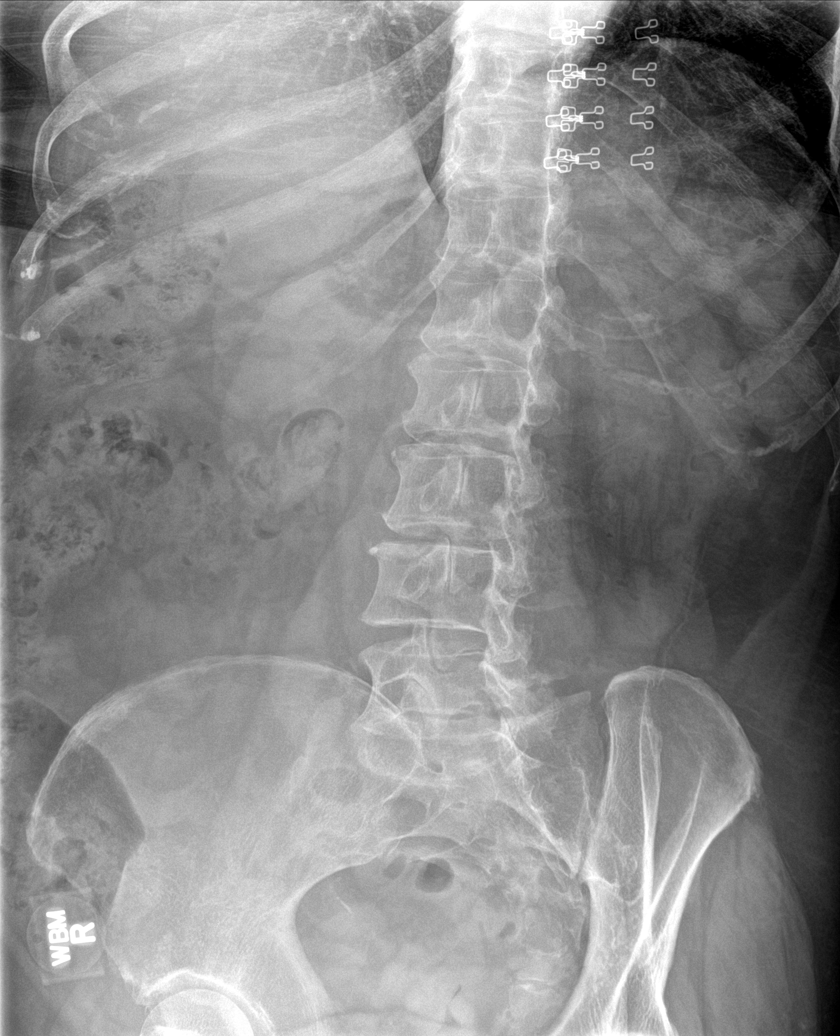

[l-spine obl (2 of 2)]
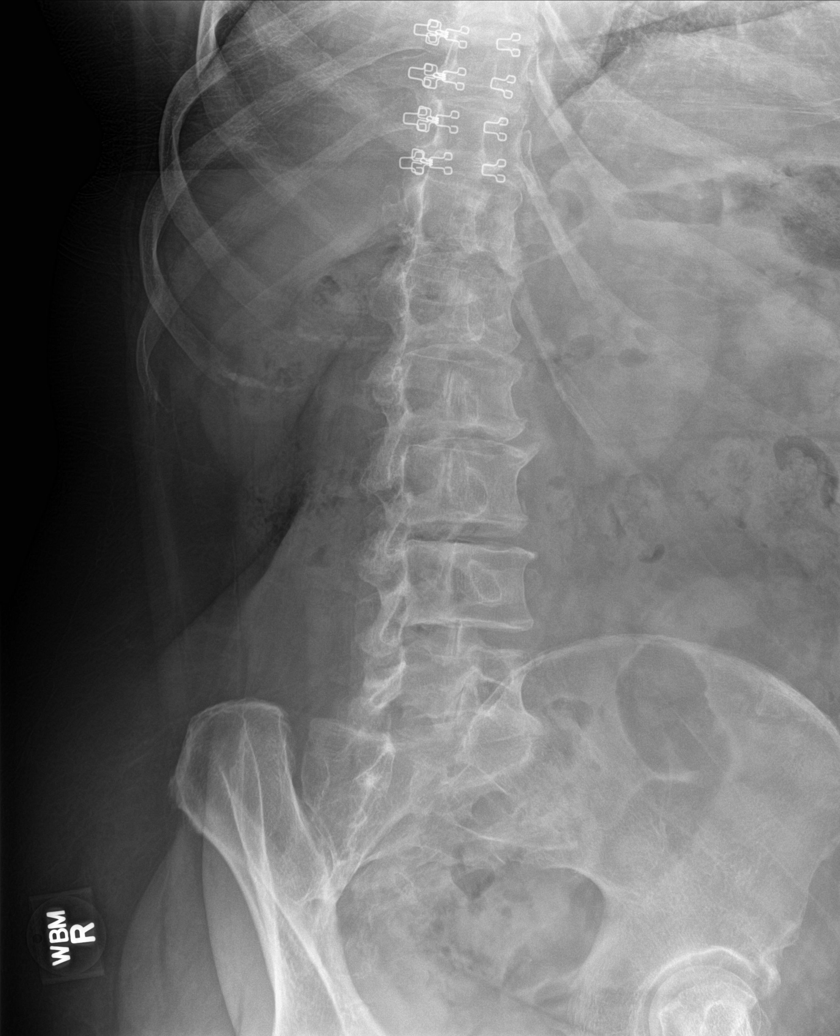

[l-spine lat]
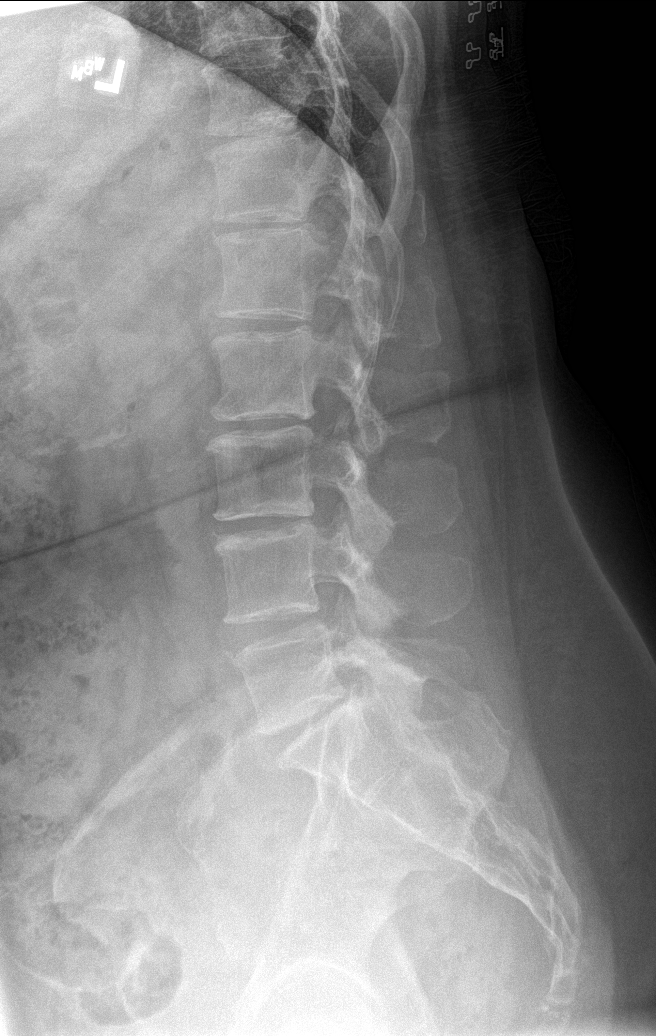

[l-spine spot]
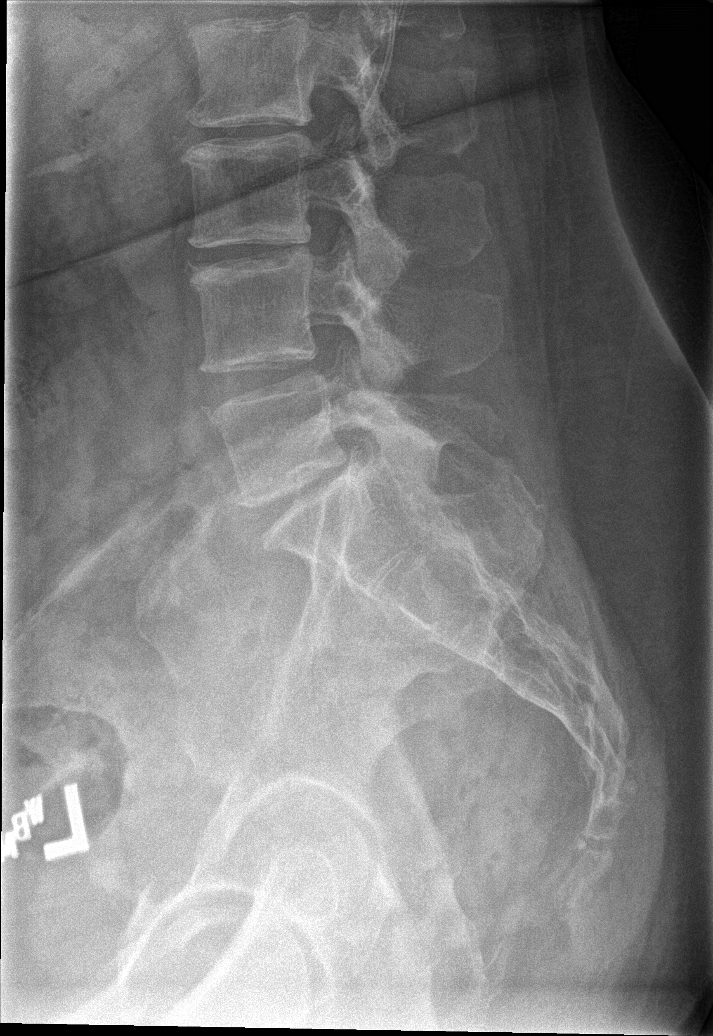

[l-spine ap]
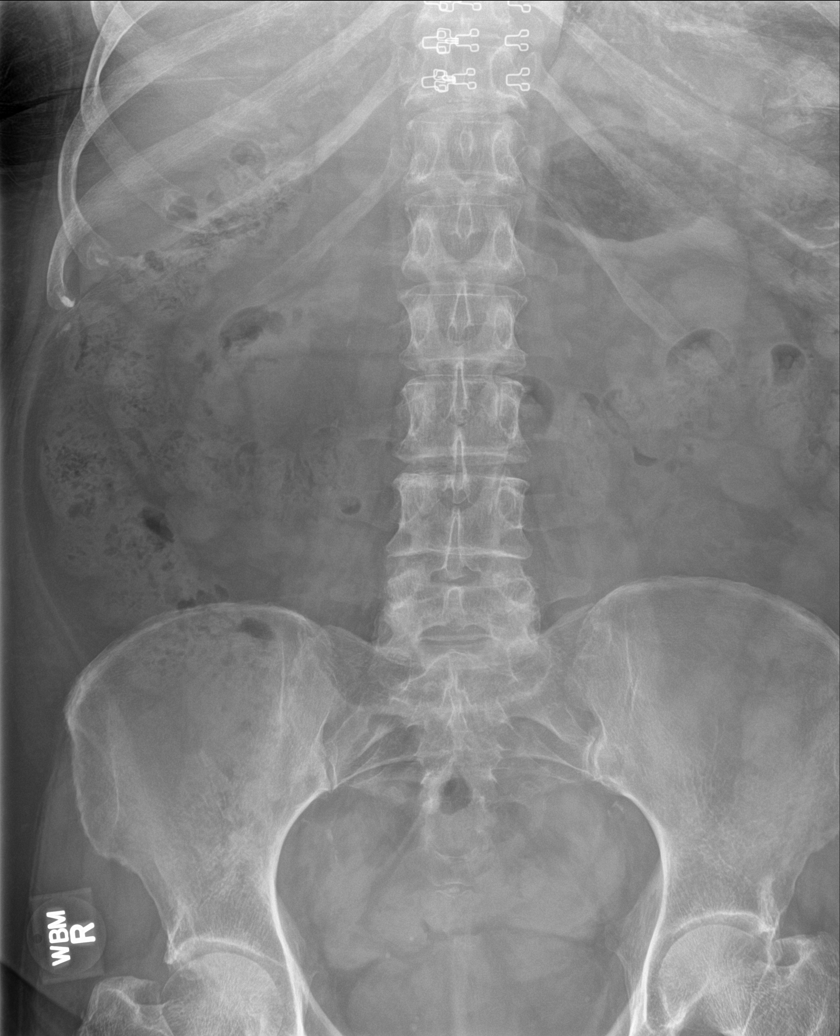

[5 of 5 positions shown; findings below may reference images not displayed]

FINDINGS: Five lumbar vertebrae. Alignment is preserved. Lumbar vertebral body
height is maintained. No definite pars interarticularis defect
identified on oblique radiographs. Mild-to-moderate disc
degeneration, greatest at L2-L3, L3-L4 and L5-S1. Multilevel
degenerative endplate spurring, greatest at L5-S1. Facet arthrosis
within the lower lumbar spine.
IMPRESSION: No compression deformity.

Lumbar spondylosis as described.

## 2022-01-18 ENCOUNTER — Other Ambulatory Visit: Payer: Self-pay | Admitting: Internal Medicine

## 2022-01-18 DIAGNOSIS — I10 Essential (primary) hypertension: Secondary | ICD-10-CM

## 2022-01-20 ENCOUNTER — Ambulatory Visit: Payer: PPO | Admitting: Pharmacist

## 2022-01-20 DIAGNOSIS — E1165 Type 2 diabetes mellitus with hyperglycemia: Secondary | ICD-10-CM

## 2022-01-20 NOTE — Chronic Care Management (AMB) (Signed)
01/20/2022 Name: Summer Young MRN: [id number] DOB: [birth year]  Chief Complaint  Patient presents with   Medication Management    T2DM   Medication Assistance    Summer Young is a 71 y.o. year old female who presented for a telephone visit.   They were referred to the pharmacist by their PCP for assistance in managing diabetes, hypertension, hyperlipidemia, and medication access.    Subjective:  Care Team: Primary Care Provider: [name], NP ; Next Scheduled Visit: 06/04/2022  Medication Access/Adherence  Current Pharmacy:  [facility] [address] [place] [address] Phone: [phone] Fax: [phone]   Patient reports affordability concerns with their medications: No  Patient reports access/transportation concerns to their pharmacy: No  Patient reports adherence concerns with their medications:  No     Type 2 Diabetes:   Current medications:  Tresiba 20 units once daily Metformin ER 500 mg - 3 tablets daily with breakfast Ozempic 1 mg once weekly   Patient using Freestyle Libre 3 continuous blood glucose monitor Using her iPhone in place of reader devices Review results from Freestyle LibreView:     Patient reports recent hypoglycemic episodes ~3 times over past 2 weeks overnight. Reports has treated lows with glucose tablets or orange juice Note patient carries glucose tablets with her   Reports having regular well-balanced meals, including a controlled portion size. However, reports her appetite has been reduced related to social stress with separating from her husband   Current physical activity: limited to activity at work   Statin: atorvastatin 40 mg daily     Current medication access support: Enrolled in [place] and Ozempic medication assistance from [employer] for 2023 calendar year - Reports currently has sufficient supply of Tresiba and Ozempic to last until end of 2023 calendar year -  Meets financial criteria for re-enrollment in [place] and [place] patient assistance program through [employer]. Collaborating with provider, CPhT, and patient to pursue renewal in assistance program for 2024  Patient reports received and mailed back assistance program application back to [place] a couple of weeks ago    Objective: Lab Results  Component Value Date   HGBA1C 7.2 (A) 12/02/2021    Lab Results  Component Value Date   CREATININE 0.71 12/02/2021   BUN 14 12/02/2021   NA 140 12/02/2021   K 4.2 12/02/2021   CL 105 12/02/2021   CO2 24 12/02/2021    Lab Results  Component Value Date   CHOL 111 12/02/2021   HDL 50 12/02/2021   LDLCALC 46 12/02/2021   TRIG 72 12/02/2021   CHOLHDL 2.2 12/02/2021   BP Readings from Last 3 Encounters:  12/02/21 128/76  07/25/21 124/82  05/29/21 122/66   Pulse Readings from Last 3 Encounters:  12/02/21 88  07/25/21 90  05/29/21 79     Medications Reviewed Today     Reviewed by Summer Young, RPH-CPP (Pharmacist) on 01/20/22 at [phone]  Med List Status: <None>   Medication Order Taking? Sig Documenting Provider Last Dose Status Informant  acetaminophen (TYLENOL) 650 MG CR tablet [id number]  Take 650 mg by mouth 2 (two) times daily as needed for pain. [provider]  Active   alendronate (FOSAMAX) 70 MG tablet [id number]  Take 1 tablet (70 mg total) by mouth once a week. Take with a full glass of water on an empty stomach. Summer Fenton, NP  Active   atorvastatin (LIPITOR) 40 MG tablet [id number]  Take 1 tablet by mouth once daily Summer Fenton, NP  Active   blood glucose meter kit and supplies [id number]  Dispense based on patient and insurance preference. Use up to four times daily as directed. (FOR ICD-10 E10.9, E11.9). [name], FNP  Active   Blood Glucose Monitoring Suppl [facility] VERIO) w/Device KIT [id number]  Use to check blood sugar as directed.  DX: E11.65 Summer Fenton, NP  Active   Cholecalciferol  (VITAMIN D3) 50 MCG (2000 UT) capsule [id number]  Take 1,000 Units by mouth daily.  [provider]  Active   Continuous Blood Gluc Sensor (FREESTYLE LIBRE 3 SENSOR) [place] [id number]  Place 1 sensor on the skin every 14 days DX: E11.65 Summer Fenton, NP  Active   EPINEPHrine 0.3 mg/0.3 mL IJ SOAJ injection [id number]  Inject 0.3 mg into the muscle as needed for anaphylaxis. Summer Fenton, NP  Active   glucose blood [facility] VERIO) test strip [id number]  Use to check blood sugar as directed  DX: E11.65 Summer Fenton, NP  Active   insulin degludec (TRESIBA FLEXTOUCH) 100 UNIT/ML FlexTouch Pen [id number] Yes Inject 26 Units into the skin daily.  Patient taking differently: Inject 20 Units into the skin daily.   Summer Fenton, NP Taking Active   metFORMIN (GLUCOPHAGE-XR) 500 MG 24 hr tablet [id number] Yes TAKE 3 TABLETS BY MOUTH ONCE DAILY WITH BREAKFAST Summer Fenton, NP Taking Active   omeprazole (PRILOSEC) 40 MG capsule [id number]  TAKE 1 CAPSULE BY MOUTH ONCE DAILY BEFORE BREAKFAST Summer Fenton, NP  Active   OneTouch Delica Lancets 12W MISC [id number]  Use to check blood sugar as directed.  DX: E11.65 Summer Fenton, NP  Active   [facility] ULTRA test strip [id number]  Check blood sugar 1 x daily as directed Olin Hauser, DO  Active   Semaglutide, 1 MG/DOSE, (OZEMPIC, 1 MG/DOSE,) 2 MG/1.5ML SOPN [id number] Yes INJECT 1 MG SUBCUTANEOUSLY ONCE A WEEK Baity, Coralie Keens, NP Taking Active   spironolactone (ALDACTONE) 25 MG tablet [id number]  Take 1 tablet by mouth once daily Summer Fenton, NP  Active   vitamin B-12 (CYANOCOBALAMIN) 1000 MCG tablet [id number]  Take 1,000 mcg by mouth daily.  [provider]  Active               Assessment/Plan:   Patient requests referral to Clinical Social Worker for help with finding social support for recent life stress  - Will send referral message to Clinical Social Worker  Diabetes: - Collaborate with PCP regarding recent  episodes of hypoglycemia. Recommend having patient decrease Tresiba dose to 18 units daily, while continuing current doses of metformin and Ozempic. Provider agrees  Patient advised to decrease Tresiba dose to 18 units daily   Patient verbalizes understanding via teachback - Encourage patient to continue having regular well-balanced meals, while controlling carbohydrate portion sizes Encouraged patient to have a planned carbohydrate + protein snack (such as apple + peanut butter) at bedtime - Patient to continue to use CGM as feedback on dietary choices/habits to aid with glucose control  Identify from [place], patient's low glucose alarm is currently off  Advise patient to turn back on CGM Low Glucose Alarm - Meets financial criteria for re-enrollment in Ozempic and [place] patient assistance program through [employer]. Collaborating with provider, CPhT, and patient to pursue assistance.        Follow Up Plan: Clinic Pharmacist will follow up with patient by telephone again on  02/19/2022 at 8:30 am   Wallace Cullens, PharmD, [facility] [phone]

## 2022-01-20 NOTE — Patient Instructions (Signed)
Goals Addressed             This Visit's Progress    Pharmacy Goals       Our goal A1c is less than 7%. This corresponds with fasting sugars less than 130 and 2 hour after meal sugars less than 180. Please check your blood sugars and keep log of results  Our goal bad cholesterol, or LDL, is less than 70 . This is why it is important to continue taking your atorvastatin  Please check your home blood pressure, keep a log of the results and bring this with you to your medical appointments.  Feel free to call me with any questions or concerns. I look forward to our next call!  Wallace Cullens, PharmD, [place] [phone]

## 2022-01-20 NOTE — Telephone Encounter (Signed)
Requested Prescriptions  Pending Prescriptions Disp Refills   spironolactone (ALDACTONE) 25 MG tablet [Pharmacy Med Name: Spironolactone 25 MG Oral Tablet] 90 tablet 1    Sig: Take 1 tablet by mouth once daily     Cardiovascular: Diuretics - Aldosterone Antagonist Passed - 01/18/2022  9:46 AM      Passed - Cr in normal range and within 180 days    Creat  Date Value Ref Range Status  12/02/2021 0.71 0.50 - 1.05 mg/dL Final   Creatinine, Urine  Date Value Ref Range Status  12/02/2021 34 20 - 275 mg/dL Final         Passed - K in normal range and within 180 days    Potassium  Date Value Ref Range Status  12/02/2021 4.2 3.5 - 5.3 mmol/L Final         Passed - Na in normal range and within 180 days    Sodium  Date Value Ref Range Status  12/02/2021 140 135 - 146 mmol/L Final         Passed - eGFR is 30 or above and within 180 days    GFR, Est African American  Date Value Ref Range Status  06/10/2018 91 > OR = 60 mL/min/1.11m Final   GFR, Est Non African American  Date Value Ref Range Status  06/10/2018 79 > OR = 60 mL/min/1.746mFinal   eGFR  Date Value Ref Range Status  12/02/2021 92 > OR = 60 mL/min/1.7326minal         Passed - Last BP in normal range    BP Readings from Last 1 Encounters:  12/02/21 128/76         Passed - Valid encounter within last 6 months    Recent Outpatient Visits           Today Type 2 diabetes mellitus with hyperglycemia, with long-term current use of insulin ([facility], EliGrayland Ormond RPH-CPP   3 weeks ago Type 2 diabetes mellitus with hyperglycemia, with long-term current use of insulin ([facility], EliGrayland Ormond RPH-CPP   1 month ago Type 2 diabetes mellitus with hyperglycemia, with long-term current use of insulin ([facility] O[facility] RPH-CPP   1 month ago Type 2 diabetes mellitus with hyperglycemia, with long-term current use of insulin  ([facility]   5 months ago [facility]       Future Appointments             In 4 months Baity, RegCoralie KeensP [facility]

## 2022-01-21 ENCOUNTER — Ambulatory Visit: Payer: Self-pay | Admitting: *Deleted

## 2022-01-22 ENCOUNTER — Telehealth: Payer: Self-pay | Admitting: Pharmacy Technician

## 2022-01-22 DIAGNOSIS — Z596 Low income: Secondary | ICD-10-CM

## 2022-01-22 NOTE — Progress Notes (Signed)
[facility]                                            [place] Team    01/22/2022  [address] [birth year] [phone]  Received both patient and provider portion(s) of patient assistance application(s) for [place] and Ozempic. Faxed completed application and required documents into [employer].  Sharee Pimple P. Yeraldine Forney, [place]  [phone]

## 2022-01-22 NOTE — Patient Outreach (Signed)
  Care Coordination   Initial Visit Note   01/22/2022 Name: Haeven Nickle MRN: [id number] DOB: 1950/11/22  Lajuanda Penick is a 71 y.o. year old female who sees Baity, Coralie Keens, NP for primary care. I spoke with  Val Riles by phone today.  What matters to the patients health and wellness today?  Mental Health support    Goals Addressed             This Visit's Progress    Mental health Support       Care Coordination Interventions: Patient discussed recent separation from husband, currently adjusting well stating that sleep has improved, feeling a sense of relief as if a "load has been lifted" Verbalization of feelings encouraged Patient's feelings processed regarding separation, expectation of fluctuating feelings normalized Patient encouraged to consult with an attorney for legal advice as well-patient confirms having access to a attorney in the family, information for the [facility] also provided Depression screen reviewed  Solution-Focused Strategies employed:  Active listening / Reflection utilized  Emotional Support Provided Discussed self-care action plan: continued verbalization of feelings, consult with a attorney for legal advice         SDOH assessments and interventions completed:  Yes  SDOH Interventions Today    Flowsheet Row Most Recent Value  SDOH Interventions   Food Insecurity Interventions Intervention Not Indicated  Housing Interventions Intervention Not Indicated  Transportation Interventions Intervention Not Indicated        Care Coordination Interventions:  Yes, provided   Follow up plan: Follow up call scheduled for 02/05/22    Encounter Outcome:  Pt. Visit Completed

## 2022-01-22 NOTE — Patient Instructions (Signed)
Visit Information  Thank you for taking time to visit with me today. Please don't hesitate to contact me if I can be of assistance to you.   Following are the goals we discussed today:   Goals Addressed             This Visit's Progress    Mental health Support       Care Coordination Interventions: Patient discussed recent separation from husband, currently adjusting well stating that sleep has improved, feeling a sense of relief as if a "load has been lifted" Verbalization of feelings encouraged Patient's feelings processed regarding separation, expectation of fluctuating feelings normalized Patient encouraged to consult with an attorney for legal advice as well-patient confirms having access to a attorney in the family, information for the [facility] also provided Depression screen reviewed  Solution-Focused Strategies employed:  Active listening / Reflection utilized  Emotional Support Provided Discussed self-care action plan: continued verbalization of feelings, consult with a attorney for legal advice         Our next appointment is by telephone on 02/05/22 at   Please call the care guide team at [phone] if you need to cancel or reschedule your appointment.   If you are experiencing a Mental Health or [employer] or need someone to talk to, please call the Suicide and Crisis Lifeline: 988   Patient verbalizes understanding of instructions and care plan provided today and agrees to view in [place]. Active MyChart status and patient understanding of how to access instructions and care plan via MyChart confirmed with patient.     Telephone follow up appointment with care management team member scheduled for: 02/05/22  Elliot Gurney, [place] Worker  [facility] Care Management [phone]

## 2022-02-03 ENCOUNTER — Ambulatory Visit: Payer: Self-pay | Admitting: *Deleted

## 2022-02-03 NOTE — Patient Outreach (Signed)
  Care Coordination   02/03/2022 Name: Summer Young MRN: [id number] DOB: [birth year]   Care Coordination Outreach Attempts:  An unsuccessful telephone outreach was attempted for a scheduled appointment today.  Follow Up Plan:  Additional outreach attempts will be made to offer the patient care coordination information and services.   Encounter Outcome:  No Answer   Care Coordination Interventions:  No, not indicated    Treonna Klee, [place] Worker  [facility] Care Management [phone]

## 2022-02-14 ENCOUNTER — Ambulatory Visit: Payer: Self-pay | Admitting: *Deleted

## 2022-02-14 NOTE — Patient Instructions (Signed)
Visit Information  Thank you for taking time to visit with me today. Please don't hesitate to contact me if I can be of assistance to you.   Following are the goals we discussed today:   Goals Addressed             This Visit's Progress    Mental health Support       Care Coordination Interventions: Patient discussed recent separation from husband, currently adjusting well stating that sleep continues to improve, continued feeling of relief  Verbalization of feelings encouraged Patient's feelings processed regarding separation, expectation of fluctuating feelings normalized Patient has consulted with  a attorney in the family, working on separation papers  Solution-Focused Strategies employed:  Active listening / Reflection utilized  Emotional Support Provided Discussed self-care action plan: continued verbalization of feelings, spending tine with friends and family, continued follow up with an attorney for legal advice         Our next appointment is by telephone on 03/07/22 at 1pm  Please call the care guide team at [phone] if you need to cancel or reschedule your appointment.   If you are experiencing a Mental Health or [place] or need someone to talk to, please call 911   Patient verbalizes understanding of instructions and care plan provided today and agrees to view in [place]. Active MyChart status and patient understanding of how to access instructions and care plan via MyChart confirmed with patient.     Telephone follow up appointment with care management team member scheduled for: 03/07/22  Elliot Gurney, [place] Worker  [facility] Care Management [phone]

## 2022-02-14 NOTE — Patient Outreach (Signed)
  Care Coordination   Follow Up Visit Note   02/14/2022 Name: Summer Young MRN: [id number] DOB: 1950-09-05  Summer Young is a 71 y.o. year old female who sees Baity, Coralie Keens, NP for primary care. I spoke with  Summer Young by phone today.  What matters to the patients health and wellness today?  Emotional support    Goals Addressed             This Visit's Progress    Mental health Support       Care Coordination Interventions: Patient discussed recent separation from husband, currently adjusting well stating that sleep continues to improve, continued feeling of relief  Verbalization of feelings encouraged Patient's feelings processed regarding separation, expectation of fluctuating feelings normalized Patient has consulted with  a attorney in the family, working on separation papers  Solution-Focused Strategies employed:  Active listening / Reflection utilized  Emotional Support Provided Discussed self-care action plan: continued verbalization of feelings, spending tine with friends and family, continued follow up with an attorney for legal advice         SDOH assessments and interventions completed:  No     Care Coordination Interventions:  Yes, provided   Follow up plan: Follow up call scheduled for 03/07/22    Encounter Outcome:  Pt. Visit Completed

## 2022-02-19 ENCOUNTER — Ambulatory Visit: Payer: PPO | Admitting: Pharmacist

## 2022-02-19 DIAGNOSIS — E1165 Type 2 diabetes mellitus with hyperglycemia: Secondary | ICD-10-CM

## 2022-02-19 NOTE — Progress Notes (Signed)
02/19/2022 Name: Summer Young MRN: [id number] DOB: 04-02-1950  Chief Complaint  Patient presents with   Medication Management    Summer Young is a 72 y.o. year old female who presented for a telephone visit.   They were referred to the pharmacist by their PCP for assistance in managing diabetes, hypertension, hyperlipidemia, and medication access.    Subjective:  Care Team: Primary Care Provider: [name], NP ; Next Scheduled Visit: 06/04/2022   Medication Access/Adherence  Current Pharmacy:  [facility] [address] [place] [address] Phone: [phone] Fax: [phone]   Patient reports affordability concerns with their medications: No  Patient reports access/transportation concerns to their pharmacy: No  Patient reports adherence concerns with their medications:  No     Type 2 Diabetes:   Current medications:  Tresiba 18 units once daily Metformin ER 500 mg - 3 tablets daily with breakfast Ozempic 1 mg once weekly   Patient using Freestyle Libre 3 continuous blood glucose monitor Using her iPhone in place of reader devices Review results from Freestyle LibreView:     Patient reports 1 reading in 60s ~2 weeks ago in the morning. Note patient carries glucose tablets with her   Reports having regular well-balanced meals, including a controlled portion size. However, reported her appetite has been reduced related to social stress with separating from her husband   Current physical activity: limited to activity at work   Statin: atorvastatin 40 mg daily     Current medication access support: Enrolled in [place] and Ozempic medication assistance from [employer] for 2023 calendar year - Reports currently has sufficient supply of Tresiba and [employer] - Collaborating with provider, CPhT, and patient to pursue renewal in assistance program for 2024  From review of chart, note [facility] CPhT faxed patient's  application to [employer] on 01/22/2022   Objective: Lab Results  Component Value Date   HGBA1C 7.2 (A) 12/02/2021    Lab Results  Component Value Date   CREATININE 0.71 12/02/2021   BUN 14 12/02/2021   NA 140 12/02/2021   K 4.2 12/02/2021   CL 105 12/02/2021   CO2 24 12/02/2021    Lab Results  Component Value Date   CHOL 111 12/02/2021   HDL 50 12/02/2021   LDLCALC 46 12/02/2021   TRIG 72 12/02/2021   CHOLHDL 2.2 12/02/2021    Medications Reviewed Today     Reviewed by Rennis Petty, RPH-CPP (Pharmacist) on 01/20/22 at [phone]  Med List Status: <None>   Medication Order Taking? Sig Documenting Provider Last Dose Status Informant  acetaminophen (TYLENOL) 650 MG CR tablet [id number]  Take 650 mg by mouth 2 (two) times daily as needed for pain. [provider]  Active   alendronate (FOSAMAX) 70 MG tablet [id number]  Take 1 tablet (70 mg total) by mouth once a week. Take with a full glass of water on an empty stomach. [name], NP  Active   atorvastatin (LIPITOR) 40 MG tablet [id number]  Take 1 tablet by mouth once daily [name], NP  Active   blood glucose meter kit and supplies [id number]  Dispense based on patient and insurance preference. Use up to four times daily as directed. (FOR ICD-10 E10.9, E11.9). [name], FNP  Active   Blood Glucose Monitoring Suppl [facility] VERIO) w/Device KIT [id number]  Use to check blood sugar as directed.  DX: X73.53 [name], NP  Active  Cholecalciferol (VITAMIN D3) 50 MCG (2000 UT) capsule [id number]  Take 1,000 Units by mouth daily.  [provider]  Active   Continuous Blood Gluc Sensor (FREESTYLE LIBRE 3 SENSOR) [place] [id number]  Place 1 sensor on the skin every 14 days DX: E11.65 [name], NP  Active   EPINEPHrine 0.3 mg/0.3 mL IJ SOAJ injection [id number]  Inject 0.3 mg into the muscle as needed for anaphylaxis. [name], NP  Active   glucose blood [facility] VERIO) test strip  [id number]  Use to check blood sugar as directed  DX: E11.65 [name], NP  Active   insulin degludec (TRESIBA FLEXTOUCH) 100 UNIT/ML FlexTouch Pen [id number] Yes Inject 26 Units into the skin daily.  Patient taking differently: Inject 20 Units into the skin daily.   [name], NP Taking Active   metFORMIN (GLUCOPHAGE-XR) 500 MG 24 hr tablet [id number] Yes TAKE 3 TABLETS BY MOUTH ONCE DAILY WITH BREAKFAST [name], NP Taking Active   omeprazole (PRILOSEC) 40 MG capsule [id number]  TAKE 1 CAPSULE BY MOUTH ONCE DAILY BEFORE BREAKFAST [name], NP  Active   OneTouch Delica Lancets 09U MISC [id number]  Use to check blood sugar as directed.  DX: E11.65 [name], NP  Active   [facility] ULTRA test strip [id number]  Check blood sugar 1 x daily as directed Olin Hauser, DO  Active   Semaglutide, 1 MG/DOSE, (OZEMPIC, 1 MG/DOSE,) 2 MG/1.5ML SOPN [id number] Yes INJECT 1 MG SUBCUTANEOUSLY ONCE A WEEK Baity, Coralie Keens, NP Taking Active   spironolactone (ALDACTONE) 25 MG tablet [id number]  Take 1 tablet by mouth once daily [name], NP  Active   vitamin B-12 (CYANOCOBALAMIN) 1000 MCG tablet [id number]  Take 1,000 mcg by mouth daily.  [provider]  Active               Assessment/Plan:     Diabetes: - Encourage patient to continue having regular well-balanced meals, while controlling carbohydrate portion sizes Encouraged patient to have a planned carbohydrate + protein snack (such as crackers/apple + peanut butter) at bedtime - Patient to continue to use CGM as feedback on dietary choices/habits to aid with glucose control - Patient to follow up with office if has any further low blood sugar readings or symptoms - Meets financial criteria for re-enrollment in [place] and [place] patient assistance program through [employer]. Collaborating with provider, CPhT, and patient to pursue assistance.        Follow Up Plan: Clinic Pharmacist will follow  up with patient by telephone again on 03/24/2022 at 8:30 am   Wallace Cullens, PharmD, [facility] [phone]

## 2022-02-19 NOTE — Patient Instructions (Signed)
Goals Addressed             This Visit's Progress    Pharmacy Goals       Our goal A1c is less than 7%. This corresponds with fasting sugars less than 130 and 2 hour after meal sugars less than 180. Please check your blood sugars and keep log of results  Our goal bad cholesterol, or LDL, is less than 70 . This is why it is important to continue taking your atorvastatin  Please check your home blood pressure, keep a log of the results and bring this with you to your medical appointments.  Feel free to call me with any questions or concerns. I look forward to our next call!   Wallace Cullens, PharmD, [place] [phone]

## 2022-02-26 ENCOUNTER — Telehealth: Payer: Self-pay

## 2022-02-26 NOTE — Telephone Encounter (Signed)
LMTCB 02/26/2022.   PEC please advise pt that we received a letter from [employer].  They are not able to process her request for [name] until they get a copy of her most recent federal tax return.    Thanks,   -Mickel Baas

## 2022-02-28 ENCOUNTER — Ambulatory Visit: Payer: PPO | Admitting: Pharmacist

## 2022-02-28 DIAGNOSIS — Z794 Long term (current) use of insulin: Secondary | ICD-10-CM

## 2022-02-28 DIAGNOSIS — I10 Essential (primary) hypertension: Secondary | ICD-10-CM

## 2022-02-28 NOTE — Telephone Encounter (Signed)
[facility] CPhT Sharee Pimple [place] contacted [employer] assistance program on 02/27/2022. [place] representative advised page 2 of tax return missing with original application submission. CPhT refaxed [employer] full tax return document as requested.   Called patient today to provide this update. Patient reports she has a 2 month supply of medication remaining. Nothing further needed at this time.  Wallace Cullens, PharmD, [place], CPP Clinical Pharmacist [facility] [phone]

## 2022-02-28 NOTE — Progress Notes (Signed)
02/28/2022 Name: Stacye Noori MRN: [id number] DOB: 16-Sep-1950  Chief Complaint  Patient presents with   Medication Assistance    Summer Young is a 72 y.o. year old female who was referred to the pharmacist by their PCP for assistance in managing diabetes, hypertension, hyperlipidemia, and medication access.   Today receive a call from patient following up regarding her medication assistance application with [employer].  Subjective:  Care Team: Primary Care Provider: [name], NP ; Next Scheduled Visit: 06/04/2022  Clinical Social Worker: Vern Claude, [place]; Next Scheduled Visit: 03/07/2022  Medication Access/Adherence  Current Pharmacy:  [facility] [address], [place] - [place] [address] Phone: [phone] Fax: [phone]   Patient reports affordability concerns with their medications: No  Patient reports access/transportation concerns to their pharmacy: No  Patient reports adherence concerns with their medications:  No     Collaborating with provider, CPhT, and patient to pursue renewal in assistance program for 2024  From review of notes from [place], Sharee Pimple contacted [employer] assistance program on 02/27/2022 to follow up regarding status of patient's application for re-enrollment in assistance program for [place] and Ozempic. [place] representative advised page 2 of tax return missing with original application submission. CPhT refaxed [employer] full tax return document on 1/11 as requested - Today provide this update to patient. Patient reports she has >2 month supply of both medications remaining - Provide update to office team  Hypertension:  Current medications: spironolactone 25 mg daily  Patient has an automated, upper arm home BP cuff Denies checking home blood pressure recently; reports monitor needing a new battery Reports had her blood pressure checked at [facility] a couple of weeks ago and told  that the reading was normal, but does not recall specific result.  Patient denies hypotensive s/sx including dizziness, lightheadedness.     Objective:  Lab Results  Component Value Date   HGBA1C 7.2 (A) 12/02/2021    Lab Results  Component Value Date   CREATININE 0.71 12/02/2021   BUN 14 12/02/2021   NA 140 12/02/2021   K 4.2 12/02/2021   CL 105 12/02/2021   CO2 24 12/02/2021    Lab Results  Component Value Date   CHOL 111 12/02/2021   HDL 50 12/02/2021   LDLCALC 46 12/02/2021   TRIG 72 12/02/2021   CHOLHDL 2.2 12/02/2021   BP Readings from Last 3 Encounters:  12/02/21 128/76  07/25/21 124/82  05/29/21 122/66   Pulse Readings from Last 3 Encounters:  12/02/21 88  07/25/21 90  05/29/21 79     Medications Reviewed Today     Reviewed by Rennis Petty, RPH-CPP (Pharmacist) on 02/28/22 at 1335  Med List Status: <None>   Medication Order Taking? Sig Documenting Provider Last Dose Status Informant  acetaminophen (TYLENOL) 650 MG CR tablet [id number]  Take 650 mg by mouth 2 (two) times daily as needed for pain. [provider]  Active   alendronate (FOSAMAX) 70 MG tablet [id number]  Take 1 tablet (70 mg total) by mouth once a week. Take with a full glass of water on an empty stomach. [name], NP  Active   atorvastatin (LIPITOR) 40 MG tablet [id number]  Take 1 tablet by mouth once daily [name], NP  Active   blood glucose meter kit and supplies [id number]  Dispense based on patient and insurance preference. Use up to four times daily as directed. (FOR ICD-10 E10.9, E11.9).  [name], FNP  Active   Blood Glucose Monitoring Suppl [facility] VERIO) w/Device KIT [id number]  Use to check blood sugar as directed.  DX: E11.65 [name], NP  Active   Cholecalciferol (VITAMIN D3) 50 MCG (2000 UT) capsule [id number]  Take 1,000 Units by mouth daily.  [provider]  Active   Continuous Blood Gluc Sensor (FREESTYLE LIBRE 3 SENSOR) [place]  [id number]  Place 1 sensor on the skin every 14 days DX: E11.65 [name], NP  Active   EPINEPHrine 0.3 mg/0.3 mL IJ SOAJ injection [id number]  Inject 0.3 mg into the muscle as needed for anaphylaxis. [name], NP  Active   glucose blood [facility] VERIO) test strip [id number]  Use to check blood sugar as directed  DX: E11.65 [name], NP  Active   insulin degludec (TRESIBA FLEXTOUCH) 100 UNIT/ML FlexTouch Pen [id number]  Inject 26 Units into the skin daily.  Patient taking differently: Inject 18 Units into the skin daily.   [name], NP  Active   metFORMIN (GLUCOPHAGE-XR) 500 MG 24 hr tablet [id number]  TAKE 3 TABLETS BY MOUTH ONCE DAILY WITH BREAKFAST [name], NP  Active   omeprazole (PRILOSEC) 40 MG capsule [id number]  TAKE 1 CAPSULE BY MOUTH ONCE DAILY BEFORE BREAKFAST [name], NP  Active   OneTouch Delica Lancets 88L MISC [id number]  Use to check blood sugar as directed.  DX: E11.65 [name], NP  Active   [facility] ULTRA test strip [id number]  Check blood sugar 1 x daily as directed Olin Hauser, DO  Active   Semaglutide, 1 MG/DOSE, (OZEMPIC, 1 MG/DOSE,) 2 MG/1.5ML SOPN [id number]  INJECT 1 MG SUBCUTANEOUSLY ONCE A WEEK Baity, Coralie Keens, NP  Active   spironolactone (ALDACTONE) 25 MG tablet [id number] Yes Take 1 tablet by mouth once daily [name], NP Taking Active   vitamin B-12 (CYANOCOBALAMIN) 1000 MCG tablet [id number]  Take 1,000 mcg by mouth daily.  [provider]  Active               Assessment/Plan:   Diabetes: - Have encouraged patient to continue having regular well-balanced meals, while controlling carbohydrate portion sizes - Patient to continue to use CGM as feedback on dietary choices/habits to aid with glucose control. Patient to follow up with office if needed for readings outside of established parameters or symptoms - Collaborating with provider, CPhT, and patient to pursue renewal in assistance program  for 2024    Hypertension: - Have counseled patient on blood pressure monitoring technique - Patient to replace battery in blood pressure monitor and restart home monitoring, keep log of results and have this record to review during next telephone appointment   Follow Up Plan: Clinic Pharmacist will follow up with patient by telephone again on 03/24/2022 at 8:30 am   Wallace Cullens, PharmD, [place] [phone]

## 2022-02-28 NOTE — Patient Instructions (Signed)
Goals Addressed             This Visit's Progress    Pharmacy Goals       Our goal A1c is less than 7%. This corresponds with fasting sugars less than 130 and 2 hour after meal sugars less than 180. Please check your blood sugars and keep log of results  Our goal bad cholesterol, or LDL, is less than 70 . This is why it is important to continue taking your atorvastatin  Please check your home blood pressure, keep a log of the results and bring this with you to your medical appointments.  Feel free to call me with any questions or concerns. I look forward to our next call!  Wallace Cullens, PharmD, [place] [phone]

## 2022-02-28 NOTE — Telephone Encounter (Signed)
The patient has returned missed call from the office regarding her patient assistance program   The patient believes that they did resubmit their tax return and would like to discuss further if possible

## 2022-03-07 ENCOUNTER — Telehealth: Payer: Self-pay | Admitting: *Deleted

## 2022-03-07 ENCOUNTER — Encounter: Payer: Self-pay | Admitting: *Deleted

## 2022-03-07 NOTE — Patient Outreach (Signed)
  Care Coordination   03/07/2022 Name: Paije Goodhart MRN: [id number] DOB: January 13, 1951   Care Coordination Outreach Attempts:  An unsuccessful telephone outreach was attempted for a scheduled appointment today.  Follow Up Plan:  Additional outreach attempts will be made to offer the patient care coordination information and services.   Encounter Outcome:  Pt. Visit Completed   Care Coordination Interventions:  No, not indicated    Seleste Tallman, [place] Worker  [facility] Care Management [phone]

## 2022-03-07 NOTE — Patient Outreach (Signed)
  Care Coordination   Follow Up Visit Note   03/07/2022 Name: Geniene List MRN: [id number] DOB: 1951-02-15  Brecken Walth is a 72 y.o. year old female who sees Baity, Coralie Keens, NP for primary care. I spoke with  Val Riles by phone today.  What matters to the patients health and wellness today?  Emotional support    Goals Addressed             This Visit's Progress    Mental health Support       Care Coordination Interventions: Patient confirmed officially signing separation papers from spouse, currently adjusting well, sleep continues to improve and has returned to work Teacher, adult education of feelings encouraged  Active listening / Reflection utilized  Engineer, petroleum Provided Discussed self-care action plan: continued verbalization of feelings, spending time with friends and family, continued follow up with an attorney for legal advice as needed related to divorce proceedings Patient to contact this Education officer, museum with any additional community resource needs         SDOH assessments and interventions completed:  No     Care Coordination Interventions:  Yes, provided   Follow up plan: No further intervention required.   Encounter Outcome:  Pt. Visit Completed

## 2022-03-07 NOTE — Patient Instructions (Signed)
Visit Information  Thank you for taking time to visit with me today. Please don't hesitate to contact me if I can be of assistance to you.   Following are the goals we discussed today:   Goals Addressed             This Visit's Progress    Mental health Support       Care Coordination Interventions: Patient confirmed officially signing separation papers from spouse, currently adjusting well, sleep continues to improve and has returned to work Teacher, adult education of feelings encouraged  Active listening / Reflection utilized  Emotional Support Provided Discussed self-care action plan: continued verbalization of feelings, spending time with friends and family, continued follow up with an attorney for legal advice as needed related to divorce proceedings Patient to contact this social worker with any additional community resource needs          Please call the care guide team at [phone] if you need to cancel or reschedule your appointment.   If you are experiencing a Mental Health or [place] or need someone to talk to, please call the Suicide and Crisis Lifeline: 988   Patient verbalizes understanding of instructions and care plan provided today and agrees to view in [place]. Active MyChart status and patient understanding of how to access instructions and care plan via MyChart confirmed with patient.     No further follow up required: patient to contact this Education officer, museum with any additional community resource needs.  Elliot Gurney, [place] Worker  [facility] Care Management [phone]

## 2022-03-09 ENCOUNTER — Other Ambulatory Visit: Payer: Self-pay | Admitting: Internal Medicine

## 2022-03-09 DIAGNOSIS — E1165 Type 2 diabetes mellitus with hyperglycemia: Secondary | ICD-10-CM

## 2022-03-10 NOTE — Telephone Encounter (Signed)
Requested Prescriptions  Pending Prescriptions Disp Refills   metFORMIN (GLUCOPHAGE-XR) 500 MG 24 hr tablet [Pharmacy Med Name: metFORMIN HCl ER 500 MG Oral Tablet Extended Release 24 Hour] 270 tablet 0    Sig: TAKE 3 TABLETS BY [place]     Endocrinology:  Diabetes - Biguanides Failed - 03/09/2022 10:56 AM      Failed - B12 Level in normal range and within 720 days    No results found for: "VITAMINB12"       Failed - CBC within normal limits and completed in the last 12 months    WBC  Date Value Ref Range Status  07/25/2021 9.5 3.8 - 10.8 Thousand/uL Final   RBC  Date Value Ref Range Status  07/25/2021 4.84 3.80 - 5.10 Million/uL Final   Hemoglobin  Date Value Ref Range Status  07/25/2021 13.3 11.7 - 15.5 g/dL Final   HCT  Date Value Ref Range Status  07/25/2021 40.8 35.0 - 45.0 % Final   MCHC  Date Value Ref Range Status  07/25/2021 32.6 32.0 - 36.0 g/dL Final   [facility]  Date Value Ref Range Status  07/25/2021 27.5 27.0 - 33.0 pg Final   MCV  Date Value Ref Range Status  07/25/2021 84.3 80.0 - 100.0 fL Final   No results found for: "PLTCOUNTKUC", "LABPLAT", "POCPLA" RDW  Date Value Ref Range Status  07/25/2021 13.5 11.0 - 15.0 % Final         Passed - Cr in normal range and within 360 days    Creat  Date Value Ref Range Status  12/02/2021 0.71 0.50 - 1.05 mg/dL Final   Creatinine, Urine  Date Value Ref Range Status  12/02/2021 34 20 - 275 mg/dL Final         Passed - HBA1C is between 0 and 7.9 and within 180 days    Hemoglobin A1C  Date Value Ref Range Status  12/02/2021 7.2 (A) 4.0 - 5.6 % Final   Hgb A1c MFr Bld  Date Value Ref Range Status  05/29/2021 7.8 (H) <5.7 % of total Hgb Final    Comment:    For someone without known diabetes, a hemoglobin A1c value of 6.5% or greater indicates that they may have  diabetes and this should be confirmed with a follow-up  test. . For someone with known diabetes, a value <7% indicates   that their diabetes is well controlled and a value  greater than or equal to 7% indicates suboptimal  control. A1c targets should be individualized based on  duration of diabetes, age, comorbid conditions, and  other considerations. . Currently, no consensus exists regarding use of hemoglobin A1c for diagnosis of diabetes for children. .          Passed - eGFR in normal range and within 360 days    GFR, Est African American  Date Value Ref Range Status  06/10/2018 91 > OR = 60 mL/min/1.13m Final   GFR, Est Non African American  Date Value Ref Range Status  06/10/2018 79 > OR = 60 mL/min/1.757mFinal   eGFR  Date Value Ref Range Status  12/02/2021 92 > OR = 60 mL/min/1.7318minal         Passed - Valid encounter within last 6 months    Recent Outpatient Visits           1 week ago Type 2 diabetes mellitus with hyperglycemia, with long-term current use of insulin ([facility] [place]  [place], RPH-CPP   2 weeks ago Type 2 diabetes mellitus with hyperglycemia, with long-term current use of insulin [facility])   [facility] [place], Grayland Ormond A, RPH-CPP   1 month ago Type 2 diabetes mellitus with hyperglycemia, with long-term current use of insulin [facility])   [place], Grayland Ormond A, RPH-CPP   2 months ago Type 2 diabetes mellitus with hyperglycemia, with long-term current use of insulin [facility])   [place], Grayland Ormond A, RPH-CPP   2 months ago Type 2 diabetes mellitus with hyperglycemia, with long-term current use of insulin [facility])   [place], Virl Diamond, RPH-CPP       Future Appointments             In 2 months Baity, Coralie Keens, NP [facility], [facility]

## 2022-03-11 ENCOUNTER — Telehealth: Payer: Self-pay | Admitting: Pharmacy Technician

## 2022-03-11 DIAGNOSIS — Z596 Low income: Secondary | ICD-10-CM

## 2022-03-11 NOTE — Progress Notes (Signed)
[facility])                                            [place] Team    03/11/2022  Summer Young 1950/10/25 [id number]  Care coordination call placed to [place] in regard to [place] application.   Spoke to [place] who informs patient is APPROVED 03/04/22-02/17/23. Medications will automatically fill and ship to provider's address on file based on last fill date in 2023. Patient may call [place] at [phone] if shipment has not arrived and patient does not have sufficient supply.  Joniyah Mallinger P. Barba Solt, [place]  [phone]

## 2022-03-12 ENCOUNTER — Other Ambulatory Visit: Payer: Self-pay

## 2022-03-12 DIAGNOSIS — E1165 Type 2 diabetes mellitus with hyperglycemia: Secondary | ICD-10-CM

## 2022-03-12 MED ORDER — METFORMIN HCL ER 500 MG PO TB24: 1500.0000 mg | ORAL_TABLET | Freq: Every day | ORAL | 1 refills | Status: DC

## 2022-03-17 ENCOUNTER — Other Ambulatory Visit: Payer: Self-pay | Admitting: Internal Medicine

## 2022-03-17 DIAGNOSIS — K279 Peptic ulcer, site unspecified, unspecified as acute or chronic, without hemorrhage or perforation: Secondary | ICD-10-CM

## 2022-03-17 DIAGNOSIS — R112 Nausea with vomiting, unspecified: Secondary | ICD-10-CM

## 2022-03-17 DIAGNOSIS — K219 Gastro-esophageal reflux disease without esophagitis: Secondary | ICD-10-CM

## 2022-03-18 NOTE — Telephone Encounter (Signed)
Requested Prescriptions  Pending Prescriptions Disp Refills   omeprazole (PRILOSEC) 40 MG capsule [Pharmacy Med Name: Omeprazole 40 MG Oral Capsule Delayed Release] 90 capsule 0    Sig: TAKE 1 CAPSULE BY MOUTH ONCE DAILY BEFORE BREAKFAST     Gastroenterology: Proton Pump Inhibitors Passed - 03/17/2022 12:31 PM      Passed - Valid encounter within last 12 months    Recent Outpatient Visits           2 weeks ago Type 2 diabetes mellitus with hyperglycemia, with long-term current use of insulin ([place])   [place], Grayland Ormond A, RPH-CPP   3 weeks ago Type 2 diabetes mellitus with hyperglycemia, with long-term current use of insulin ([place])   [place], Grayland Ormond A, RPH-CPP   1 month ago Type 2 diabetes mellitus with hyperglycemia, with long-term current use of insulin [facility])   [place], Grayland Ormond A, RPH-CPP   2 months ago Type 2 diabetes mellitus with hyperglycemia, with long-term current use of insulin [facility])   [place], Grayland Ormond A, RPH-CPP   3 months ago Type 2 diabetes mellitus with hyperglycemia, with long-term current use of insulin [facility])   [place], Virl Diamond, RPH-CPP       Future Appointments             In 2 months Baity, Coralie Keens, NP [facility], [facility]

## 2022-03-24 ENCOUNTER — Ambulatory Visit: Payer: PPO | Admitting: Pharmacist

## 2022-03-24 DIAGNOSIS — E1165 Type 2 diabetes mellitus with hyperglycemia: Secondary | ICD-10-CM

## 2022-03-24 DIAGNOSIS — I10 Essential (primary) hypertension: Secondary | ICD-10-CM

## 2022-03-24 NOTE — Progress Notes (Signed)
03/24/2022 Name: Summer Young MRN: [id number] DOB: November 10, 1950  Chief Complaint  Patient presents with   Medication Management   Medication Assistance    Summer Young is a 72 y.o. year old female who presented for a telephone visit.   They were referred to the pharmacist by their PCP for assistance in managing diabetes, hypertension, hyperlipidemia, and medication access.    Subjective:  Care Team: Primary Care Provider: [name], NP ; Next Scheduled Visit: 06/04/2022  Medication Access/Adherence  Current Pharmacy:  [facility] [address], [place] - [address] [place] [address] Phone: [phone] Fax: [phone]   Patient reports affordability concerns with their medications: No  Patient reports access/transportation concerns to their pharmacy: No  Patient reports adherence concerns with their medications:  No    Type 2 Diabetes:   Current medications:  Tresiba 18 units once daily Metformin ER 500 mg - 3 tablets daily with breakfast Ozempic 1 mg once weekly   Patient using Freestyle Libre 3 continuous blood glucose monitor Using her iPhone in place of reader devices Review results from Freestyle LibreView:     Denies recent hypoglycemia. Reports recently CGM low alarm went off, but when she did a fingerstick glucometer check, result showed blood sugar was not low Note patient carries glucose tablets with her   Reports having regular well-balanced meals, including a controlled portion size, including small nightly bedtime carbohydrate-protein snack (peanut butter and crackers) Note patient recently reported her appetite was reduced related to social stress with separating from her husband - Today reports she had noticed her weight decreasing. 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 Sig Documenting Provider Last Dose Status Informant  acetaminophen (TYLENOL) 650 MG CR tablet [id number]  Take 650 mg by mouth 2 (two) times daily as needed for pain. [provider]  Active   alendronate (FOSAMAX) 70 MG tablet [id number]  Take 1  tablet (70 mg total) by mouth once a week. Take with a full glass of water on an empty stomach. [name], NP  Active   atorvastatin (LIPITOR) 40 MG tablet [id number]  Take 1 tablet by mouth once daily [name], NP  Active   blood glucose meter kit and supplies [id number]  Dispense based on patient and insurance preference.  Use up to four times daily as directed. (FOR ICD-10 E10.9, E11.9). [name], FNP  Active   Blood Glucose Monitoring Suppl [facility] VERIO) w/Device KIT [id number]  Use to check blood sugar as directed.  DX: E11.65 [name], NP  Active   Cholecalciferol (VITAMIN D3) 50 MCG (2000 UT) capsule [id number]  Take 1,000 Units by mouth daily.  [provider]  Active   Continuous Blood Gluc Sensor (FREESTYLE LIBRE 3 SENSOR) [place] [id number]  Place 1 sensor on the skin every 14 days DX: E11.65 [name], NP  Active   EPINEPHrine 0.3 mg/0.3 mL IJ SOAJ injection [id number]  Inject 0.3 mg into the muscle as needed for anaphylaxis. [name], NP  Active   glucose blood [facility] VERIO) test strip [id number]  Use to check blood sugar as directed  DX: E11.65 [name], NP  Active   insulin degludec (TRESIBA FLEXTOUCH) 100 UNIT/ML FlexTouch Pen [id number] Yes Inject 26 Units into the skin daily.  Patient taking differently: Inject 18 Units into the skin daily.   [name], NP Taking Active   metFORMIN (GLUCOPHAGE-XR) 500 MG 24 hr tablet [id number] Yes Take 3 tablets (1,500 mg total) by mouth daily with breakfast. [name], NP Taking Active   omeprazole (PRILOSEC) 40 MG capsule [id number]  TAKE 1 CAPSULE BY MOUTH ONCE DAILY BEFORE BREAKFAST [name], NP  Active   OneTouch Delica Lancets 17G MISC [id number]  Use to check blood sugar as directed.  DX: E11.65 [name], NP  Active   [facility] ULTRA test strip [id number]  Check blood sugar 1 x daily as directed Olin Hauser, DO  Active   Semaglutide, 1 MG/DOSE, (OZEMPIC, 1 MG/DOSE,) 2 MG/1.5ML SOPN [id number] Yes INJECT 1 MG SUBCUTANEOUSLY ONCE A WEEK Baity, Coralie Keens, NP Taking Active   spironolactone (ALDACTONE) 25 MG tablet [id number]  Take 1 tablet by mouth once daily [name], NP  Active   vitamin B-12 (CYANOCOBALAMIN) 1000 MCG tablet [id number]  Take 1,000 mcg by mouth daily.  [provider]  Active               Assessment/Plan:   Diabetes: - Encourage patient to continue having regular well-balanced meals and bedtime snack, while controlling carbohydrate portion sizes - Patient to continue to use CGM as feedback on dietary choices/habits to aid with glucose control. Patient to follow up with office if needed for readings outside of established parameters or symptoms   Hypertension: - Have counseled patient on blood pressure monitoring technique - Patient to monitor home blood pressure, keep log of results and have this record to review during next telephone appointment   Follow Up Plan: Clinic Pharmacist will follow up with patient by telephone again in March   Edinson Domeier Sakiya Stepka, PharmD, [facility] [phone]

## 2022-03-24 NOTE — Patient Instructions (Signed)
Goals Addressed             This Visit's Progress    Pharmacy Goals       Our goal A1c is less than 7%. This corresponds with fasting sugars less than 130 and 2 hour after meal sugars less than 180. Please check your blood sugars and keep log of results  Our goal bad cholesterol, or LDL, is less than 70 . This is why it is important to continue taking your atorvastatin  Check your blood pressure once-twice weekly, and any time you have concerning symptoms like headache, chest pain, dizziness, shortness of breath, or vision changes.   Our goal is less than 130/80.  To appropriately check your blood pressure, make sure you do the following:  1) Avoid caffeine, exercise, or tobacco products for 30 minutes before checking. Empty your bladder. 2) Sit with your back supported in a flat-backed chair. Rest your arm on something flat (arm of the chair, table, etc). 3) Sit still with your feet flat on the floor, resting, for at least 5 minutes.  4) Check your blood pressure. Take 1-2 readings.  5) Write down these readings and bring with you to any provider appointments.  Bring your home blood pressure machine with you to a provider's office for accuracy comparison at least once a year.   Make sure you take your blood pressure medications before you come to any office visit, even if you were asked to fast for labs.  Feel free to call me with any questions or concerns. I look forward to our next call!  Lexie Koehl Miria Cappelli, PharmD, BCACP Clinical Pharmacist [facility] [phone]         

## 2022-03-26 ENCOUNTER — Ambulatory Visit: Payer: PPO | Admitting: Pharmacist

## 2022-03-26 DIAGNOSIS — I10 Essential (primary) hypertension: Secondary | ICD-10-CM

## 2022-03-26 NOTE — Patient Instructions (Signed)
Goals Addressed             This Visit's Progress    Pharmacy Goals       Our goal A1c is less than 7%. This corresponds with fasting sugars less than 130 and 2 hour after meal sugars less than 180. Please check your blood sugars and keep log of results  Our goal bad cholesterol, or LDL, is less than 70 . This is why it is important to continue taking your atorvastatin  Check your blood pressure once-twice weekly, and any time you have concerning symptoms like headache, chest pain, dizziness, shortness of breath, or vision changes.   Our goal is less than 130/80.  To appropriately check your blood pressure, make sure you do the following:  1) Avoid caffeine, exercise, or tobacco products for 30 minutes before checking. Empty your bladder. 2) Sit with your back supported in a flat-backed chair. Rest your arm on something flat (arm of the chair, table, etc). 3) Sit still with your feet flat on the floor, resting, for at least 5 minutes.  4) Check your blood pressure. Take 1-2 readings.  5) Write down these readings and bring with you to any provider appointments.  Bring your home blood pressure machine with you to a provider's office for accuracy comparison at least once a year.   Make sure you take your blood pressure medications before you come to any office visit, even if you were asked to fast for labs.  Feel free to call me with any questions or concerns. I look forward to our next call!  Barre Aydelott Capri Raben, PharmD, BCACP Clinical Pharmacist [facility]  [phone]         

## 2022-03-26 NOTE — Progress Notes (Signed)
03/26/2022 Name: Summer Young MRN: [id number] DOB: 30-Jul-1950  Chief Complaint  Patient presents with   Medication Management    Hypertension    Summer Young is a 72 y.o. year old female who was referred to the pharmacist by their PCP for assistance in managing diabetes, hypertension, hyperlipidemia, and medication access.   Today receive a call back from patient following up to provide her recent home blood pressure readings  Subjective:  Care Team: Primary Care Provider: [name], NP ; Next Scheduled Visit: 06/04/2022  Medication Access/Adherence  Current Pharmacy:  [facility] [address] [place] [address] Phone: [phone] Fax: [phone]   Patient reports affordability concerns with their medications: No  Patient reports access/transportation concerns to their pharmacy: No  Patient reports adherence concerns with their medications:  No      Hypertension:   Current medications: spironolactone 25 mg daily   Patient has an automated, upper arm home BP cuff Reports recent home blood pressure results: 140/82, HR 82; 122/72, HR 81; 125/73, HR 78; 128/77, HR 84   Patient denies hypotensive s/sx including dizziness, lightheadedness.    Current physical activity: patient active throughout the day at work [employer] starting to have some back pain again since doing more sweeping. States trying to do less of this activity as able and discuss with PCP if needed  Denies adding salt to her food    Objective:  Lab Results  Component Value Date   CREATININE 0.71 12/02/2021   BUN 14 12/02/2021   NA 140 12/02/2021   K 4.2 12/02/2021   CL 105 12/02/2021   CO2 24 12/02/2021   BP Readings from Last 3 Encounters:  12/02/21 128/76  07/25/21 124/82  05/29/21 122/66   Pulse Readings from Last 3 Encounters:  12/02/21 88  07/25/21 90  05/29/21 79      Medications Reviewed Today      Reviewed by Rennis Petty, RPH-CPP (Pharmacist) on 03/24/22 at 1628  Med List Status: <None>   Medication Order Taking? Sig Documenting Provider Last Dose Status Informant  acetaminophen (TYLENOL) 650 MG CR tablet [id number]  Take 650 mg by mouth 2 (two) times daily as needed for pain. [provider]  Active   alendronate (FOSAMAX) 70 MG tablet [id number]  Take 1 tablet (70 mg total) by mouth once a week. Take with a full glass of water on an empty stomach. [name], NP  Active   atorvastatin (LIPITOR) 40 MG tablet [id number]  Take 1 tablet by mouth once daily [name], NP  Active   blood glucose meter kit and supplies [id number]  Dispense based on patient and insurance preference. Use up to four times daily as directed. (FOR ICD-10 E10.9, E11.9). [name], FNP  Active   Blood Glucose Monitoring Suppl [facility] VERIO) w/Device KIT [id number]  Use to check blood sugar as directed.  DX: E11.65 [name], NP  Active   Cholecalciferol (VITAMIN D3) 50 MCG (2000 UT) capsule [id number]  Take 1,000 Units by mouth daily.  [provider]  Active   Continuous Blood Gluc Sensor (FREESTYLE LIBRE 3 SENSOR) [place] [id number]  Place 1 sensor on the skin every 14 days DX: E11.65 [name], NP  Active   EPINEPHrine 0.3 mg/0.3 mL IJ SOAJ injection [id number]  Inject 0.3 mg into the muscle as needed for anaphylaxis. [name], NP  Active   glucose blood (  ONETOUCH VERIO) test strip [id number]  Use to check blood sugar as directed  DX: E11.65 [name], NP  Active   insulin degludec (TRESIBA FLEXTOUCH) 100 UNIT/ML FlexTouch Pen [id number] Yes Inject 26 Units into the skin daily.  Patient taking differently: Inject 18 Units into the skin daily.   [name], NP Taking Active   metFORMIN (GLUCOPHAGE-XR) 500 MG 24 hr tablet [id number] Yes Take 3 tablets (1,500 mg total) by mouth daily with breakfast. [name], NP Taking Active   omeprazole (PRILOSEC) 40 MG  capsule [id number]  TAKE 1 CAPSULE BY MOUTH ONCE DAILY BEFORE BREAKFAST [name], NP  Active   OneTouch Delica Lancets 09N MISC [id number]  Use to check blood sugar as directed.  DX: E11.65 [name], NP  Active   [facility] ULTRA test strip [id number]  Check blood sugar 1 x daily as directed Olin Hauser, DO  Active   Semaglutide, 1 MG/DOSE, (OZEMPIC, 1 MG/DOSE,) 2 MG/1.5ML SOPN [id number] Yes INJECT 1 MG SUBCUTANEOUSLY ONCE A WEEK Baity, Coralie Keens, NP Taking Active   spironolactone (ALDACTONE) 25 MG tablet [id number]  Take 1 tablet by mouth once daily [name], NP  Active   vitamin B-12 (CYANOCOBALAMIN) 1000 MCG tablet [id number]  Take 1,000 mcg by mouth daily.  [provider]  Active               Assessment/Plan:   Hypertension: - Currently controlled - Reviewed long term cardiovascular and renal outcomes of uncontrolled blood pressure - Reviewed appropriate blood pressure monitoring technique and reviewed goal blood pressure.  - Recommended to continue to check home blood pressure and heart rate, keep log of results and have this record to review during next telephone appointment - Discuss the impact of salt/sodium on blood pressure control and encourage patient to review nutrition labels for sodium content of foods    Follow Up Plan: Clinical Pharmacist will outreach to patient by telephone again on 04/21/2022 at 8:30 AM  Wallace Cullens, PharmD, [facility] [phone]

## 2022-04-04 ENCOUNTER — Encounter: Payer: Self-pay | Admitting: Internal Medicine

## 2022-04-04 ENCOUNTER — Ambulatory Visit (INDEPENDENT_AMBULATORY_CARE_PROVIDER_SITE_OTHER): Payer: PPO | Admitting: Internal Medicine

## 2022-04-04 VITALS — BP 108/58 | HR 83 | Temp 97.3°F | Wt 155.0 lb

## 2022-04-04 DIAGNOSIS — J329 Chronic sinusitis, unspecified: Secondary | ICD-10-CM | POA: Diagnosis not present

## 2022-04-04 DIAGNOSIS — B9789 Other viral agents as the cause of diseases classified elsewhere: Secondary | ICD-10-CM | POA: Diagnosis not present

## 2022-04-04 MED ORDER — FLUTICASONE PROPIONATE 50 MCG/ACT NA SUSP: 2.0000 | Freq: Every day | NASAL | 0 refills | Status: AC

## 2022-04-04 MED ORDER — CETIRIZINE HCL 10 MG PO TABS: 10.0000 mg | ORAL_TABLET | Freq: Every day | ORAL | 0 refills | Status: DC

## 2022-04-04 NOTE — Progress Notes (Signed)
Subjective:    Patient ID: Summer Young, female    DOB: 1950/10/25, 72 y.o.   MRN: [name]  HPI  Patient presents to clinic today with complaint of headache, runny nose, nasal congestion, sore throat and cough. This started 2 days ago. She is not blowing much mucous out of her nose. The cough is productive of dark mucous. She denies ear pain, shortness of breath, chest pain, nausea, vomiting or diarrhea. She denies fever but has had chills and body aches. She has not taken any medication OTC for this. She has not had sick contacts that she is aware of.   Review of Systems  Past Medical History:  Diagnosis Date   Allergy    Chronic pain of both knees    Diabetes mellitus without complication (HCC)    Hyperlipidemia    Hypertension     Current Outpatient Medications  Medication Sig Dispense Refill   acetaminophen (TYLENOL) 650 MG CR tablet Take 650 mg by mouth 2 (two) times daily as needed for pain.     alendronate (FOSAMAX) 70 MG tablet Take 1 tablet (70 mg total) by mouth once a week. Take with a full glass of water on an empty stomach. 12 tablet 1   atorvastatin (LIPITOR) 40 MG tablet Take 1 tablet by mouth once daily 90 tablet 3   blood glucose meter kit and supplies Dispense based on patient and insurance preference. Use up to four times daily as directed. (FOR ICD-10 E10.9, E11.9). 1 each 0   Blood Glucose Monitoring Suppl (ONETOUCH VERIO) w/Device KIT Use to check blood sugar as directed.  DX: E11.65 1 kit 0   Cholecalciferol (VITAMIN D3) 50 MCG (2000 UT) capsule Take 1,000 Units by mouth daily.      Continuous Blood Gluc Sensor (FREESTYLE LIBRE 3 SENSOR) MISC Place 1 sensor on the skin every 14 days DX: E11.65 2 each 12   EPINEPHrine 0.3 mg/0.3 mL IJ SOAJ injection Inject 0.3 mg into the muscle as needed for anaphylaxis. 1 each 1   glucose blood (ONETOUCH VERIO) test strip Use to check blood sugar as directed  DX: E11.65 100 each 1   insulin degludec (TRESIBA FLEXTOUCH) 100  UNIT/ML FlexTouch Pen Inject 26 Units into the skin daily. (Patient taking differently: Inject 18 Units into the skin daily.) 15 mL 0   metFORMIN (GLUCOPHAGE-XR) 500 MG 24 hr tablet Take 3 tablets (1,500 mg total) by mouth daily with breakfast. 270 tablet 1   omeprazole (PRILOSEC) 40 MG capsule TAKE 1 CAPSULE BY MOUTH ONCE DAILY BEFORE BREAKFAST 90 capsule 0   OneTouch Delica Lancets 99991111 MISC Use to check blood sugar as directed.  DX: E11.65 100 each 1   ONETOUCH ULTRA test strip Check blood sugar 1 x daily as directed 100 each 12   Semaglutide, 1 MG/DOSE, (OZEMPIC, 1 MG/DOSE,) 2 MG/1.5ML SOPN INJECT 1 MG SUBCUTANEOUSLY ONCE A WEEK 4 mL 0   spironolactone (ALDACTONE) 25 MG tablet Take 1 tablet by mouth once daily 90 tablet 1   vitamin B-12 (CYANOCOBALAMIN) 1000 MCG tablet Take 1,000 mcg by mouth daily.      No current facility-administered medications for this visit.    Allergies  Allergen Reactions   Aspirin Hives    Other reaction(s): Other (See Comments) Other Reaction: Not Assessed    Insulin Detemir Hives and Rash    Errythema, edema, heat at site of injection did not improve after 2 weeks of use.     Shellfish Allergy Anaphylaxis  Dapagliflozin Other (See Comments)    Yeast infections Yeast infection Farxiga   Influenza Vaccines Other (See Comments)    Dulce Sellar   Other     Other reaction(s): Other (See Comments) Uncoded Allergy. Allergen: Shellfish, Other Reaction: Not Assessed    Flu vaccine given 1998 caused anaphylaxis    Family History  Problem Relation Age of Onset   Diabetes Mother    Diabetes Father    Stroke Father    Diabetes Sister    Heart attack Other    Diabetes Sister    Diabetes Sister    Diabetes Sister     Social History   Socioeconomic History   Marital status: Married    Spouse name: Reynoso,Eduardo   Number of children: Not on file   Years of education: Not on file   Highest education level: High school graduate  Occupational History    Occupation: housekeeper    Comment: self-employed   Occupation: Self- Employed  Tobacco Use   Smoking status: Never   Smokeless tobacco: Never  Vaping Use   Vaping Use: Never used  Substance and Sexual Activity   Alcohol use: Yes    Alcohol/week: 0.0 - 1.0 standard drinks of alcohol    Comment: occasional   Drug use: Never   Sexual activity: Yes    Birth control/protection: None  Other Topics Concern   Not on file  Social History Narrative   Not on file   Social Determinants of Health   Financial Resource Strain: Low Risk  (09/26/2021)   Overall Financial Resource Strain (CARDIA)    Difficulty of Paying Living Expenses: Not very hard  Food Insecurity: No Food Insecurity (01/21/2022)   Hunger Vital Sign    Worried About Running Out of Food in the Last Year: Never true    Ran Out of Food in the Last Year: Never true  Transportation Needs: No Transportation Needs (01/21/2022)   PRAPARE - Hydrologist (Medical): No    Lack of Transportation (Non-Medical): No  Physical Activity: Insufficiently Active (07/29/2021)   Exercise Vital Sign    Days of Exercise per Week: 2 days    Minutes of Exercise per Session: 20 min  Stress: Stress Concern Present (09/26/2021)   [place]    Feeling of Stress : To some extent  Social Connections: Moderately Isolated (09/26/2021)   Social Connection and Isolation Panel [NHANES]    Frequency of Communication with Friends and Family: More than three times a week    Frequency of Social Gatherings with Friends and Family: More than three times a week    Attends Religious Services: Never    Marine scientist or Organizations: No    Attends Archivist Meetings: Never    Marital Status: Married  Human resources officer Violence: Not At Risk (09/26/2021)   Humiliation, Afraid, Rape, and Kick questionnaire    Fear of Current or Ex-Partner: No     Emotionally Abused: No    Physically Abused: No    Sexually Abused: No     Constitutional: Patient reports headache.  Denies fever, malaise, fatigue, or abrupt weight changes.  HEENT: Patient reports runny nose, nasal congestion and sore throat.  Denies eye pain, eye redness, ear pain, ringing in the ears, wax buildup, bloody nose. Respiratory: Pt reports cough. Denies difficulty breathing, shortness of breath, or sputum production.   Cardiovascular: Denies chest pain, chest tightness, palpitations or swelling in the  hands or feet.  Skin: Denies redness, rashes, lesions or ulcercations.  Neurological: Denies dizziness, difficulty with memory, difficulty with speech or problems with balance and coordination.   No other specific complaints in a complete review of systems (except as listed in HPI above).     Objective:   Physical Exam  BP (!) 108/58 (BP Location: Left Arm, Patient Position: Sitting, Cuff Size: Normal)   Pulse 83   Temp (!) 97.3 F (36.3 C) (Temporal)   Wt 155 lb (70.3 kg)   SpO2 100%   BMI 28.35 kg/m   Wt Readings from Last 3 Encounters:  12/02/21 158 lb (71.7 kg)  07/25/21 161 lb (73 kg)  05/29/21 162 lb (73.5 kg)    General: Appears her stated age, overweight, in NAD. Skin: Warm, dry and intact. No rashes noted. HEENT: Head: normal shape and size, maxillary sinus tenderness noted; Eyes: sclera white, no icterus, conjunctiva pink, PERRLA and EOMs intact;  Nose: mucosa boggy and moist, turbinates swollen; Throat/Mouth: Teeth present, mucosa erythematous and moist, + PND, no exudate, lesions or ulcerations noted.  Neck:  No adenopathy noted  Cardiovascular: Normal rate and rhythm. S1,S2 noted.  No murmur, rubs or gallops noted.  Pulmonary/Chest: Normal effort and positive vesicular breath sounds. No respiratory distress. No wheezes, rales or ronchi noted.  Neurological: Alert and oriented.    BMET    Component Value Date/Time   NA 140 12/02/2021 0836   K  4.2 12/02/2021 0836   CL 105 12/02/2021 0836   CO2 24 12/02/2021 0836   GLUCOSE 88 12/02/2021 0836   BUN 14 12/02/2021 0836   CREATININE 0.71 12/02/2021 0836   CALCIUM 9.3 12/02/2021 0836   GFRNONAA 79 06/10/2018 0824   GFRAA 91 06/10/2018 0824    Lipid Panel     Component Value Date/Time   CHOL 111 12/02/2021 0836   TRIG 72 12/02/2021 0836   HDL 50 12/02/2021 0836   CHOLHDL 2.2 12/02/2021 0836   LDLCALC 46 12/02/2021 0836    CBC    Component Value Date/Time   WBC 9.5 07/25/2021 1554   RBC 4.84 07/25/2021 1554   HGB 13.3 07/25/2021 1554   HCT 40.8 07/25/2021 1554   PLT 372 07/25/2021 1554   MCV 84.3 07/25/2021 1554   MCH 27.5 07/25/2021 1554   MCHC 32.6 07/25/2021 1554   RDW 13.5 07/25/2021 1554   LYMPHSABS 2,967 04/12/2019 0818   EOSABS 95 04/12/2019 0818   BASOSABS 34 04/12/2019 0818    Hgb A1C Lab Results  Component Value Date   HGBA1C 7.2 (A) 12/02/2021           Assessment & Plan:   Viral Sinusitis:  Rapid covid: negative Rapid flu: negative Encouraged rest and fluids RX for Zyrtec 10 mg daily in pm x 1 week RX for Flonase 1 spray each notstril in am x 1 seek No indication for abx at this time Offered steroid shot for symptom management, she declined  RTC in 2 months for annual exam Webb Silversmith, NP

## 2022-04-04 NOTE — Patient Instructions (Signed)
Sinus Pain  Sinus pain may occur when your sinuses become clogged or swollen. Sinuses are air-filled spaces in your skull that are behind the bones of your face and forehead. Sinus pain can range from mild to severe. What are the causes? Sinus pain can result from various conditions that affect the sinuses. Common causes include: Colds. Sinus infections. Allergies. What are the signs or symptoms? The main symptom of this condition is pain or pressure in your face, forehead, ears, or upper teeth. People who have sinus pain often have other symptoms, such as: Congested or runny nose. Fever. Inability to smell. Headache. Weather changes can make symptoms worse. How is this diagnosed? Your health care provider will diagnose this condition based on your symptoms and a physical exam. If you have pain that keeps coming back or does not go away, your health care provider may recommend more testing. This may include: Imaging tests, such as a CT scan or MRI, to check for problems with your sinuses. Examination of your sinuses using a thin tool with a camera that is inserted through your nose (endoscopy). How is this treated? Treatment for this condition depends on the cause. Sinus pain that is caused by a sinus infection may be treated with antibiotic medicine. Sinus pain that is caused by congestion may be helped by rinsing out (flushing) the nose and sinuses with saline solution. Sinus pain that is caused by allergies may be helped by allergy medicines (antihistamines) and medicated nasal sprays. Sinus surgery may be needed in some cases if other treatments do not help. Follow these instructions at home: General instructions If directed: Apply a warm, moist washcloth to your face to help relieve pain. Use a nasal saline wash. Follow the directions on the bottle or box. Hydrate and humidify Drink enough water to keep your urine clear or pale yellow. Staying hydrated will help to thin your  mucus. Use a humidifier if your home is dry. Inhale steam for 10-15 minutes, 3-4 times a day or as told by your health care provider. You can do this in the bathroom while a hot shower is running. Limit your exposure to cool or dry air. Medicines  Take over-the-counter and prescription medicines only as told by your health care provider. If you were prescribed an antibiotic medicine, take it as told by your health care provider. Do not stop taking the antibiotic even if you start to feel better. If you have congestion, use a nasal spray to help lessen pressure. Contact a health care provider if: You have sinus pain more than one time a week. You have sensitivity to light or sound. You develop a fever. You feel nauseous or you vomit. Your sinus pain or headache does not get better with treatment. Get help right away if: You have vision problems. You have sudden, severe pain in your face or head. You have a seizure. You are confused. You have a stiff neck. Summary Sinus pain occurs when your sinuses become clogged or swollen. Sinus pain can result from various conditions that affect the sinuses, such as a cold, a sinus infection, or an allergy. Treatment for this condition depends on the cause. It may include medicine, such as antibiotics or antihistamines. This information is not intended to replace advice given to you by your health care provider. Make sure you discuss any questions you have with your health care provider. Document Revised: 01/06/2021 Document Reviewed: 01/06/2021 Elsevier Patient Education  [place].

## 2022-04-21 ENCOUNTER — Ambulatory Visit: Payer: PPO | Admitting: Pharmacist

## 2022-04-21 DIAGNOSIS — I10 Essential (primary) hypertension: Secondary | ICD-10-CM

## 2022-04-21 DIAGNOSIS — E1165 Type 2 diabetes mellitus with hyperglycemia: Secondary | ICD-10-CM

## 2022-04-21 NOTE — Progress Notes (Signed)
04/21/2022 Name: Summer Young MRN: [name] DOB: 01-24-1951  Chief Complaint  Patient presents with   Medication Management    Summer Young is a 72 y.o. year old female who presented for a telephone visit.   They were referred to the pharmacist by their PCP for assistance in managing diabetes, hypertension, hyperlipidemia, and medication access.   From review of chart, note patient seen for office visit with PCP on 2/16 related to  headache, runny nose, nasal congestion, sore throat and cough.  Today reports symptoms resolved with use of cetirizine and fluticasone nasal spray as recommended by PCP and now not currently needing either medication   Subjective:  Care Team: Primary Care Provider: [name], NP ; Next Scheduled Visit: 06/04/2022  Medication Access/Adherence  Current Pharmacy:  [facility] [address] [place] - [address] [place] [address] Phone: [phone] Fax: [phone]   Patient reports affordability concerns with their medications: No  Patient reports access/transportation concerns to their pharmacy: No  Patient reports adherence concerns with their medications:  No     Type 2 Diabetes:   Current medications:  Tresiba 18 units once daily Metformin ER 500 mg - 3 tablets daily with breakfast Ozempic 1 mg once weekly   Patient using Freestyle Libre 3 continuous blood glucose monitor Using her iPhone in place of reader devices Review results from Freestyle LibreView:     Denies recent hypoglycemia. Reports recently CGM low alarm went off, but when she did a fingerstick glucometer check, result showed blood sugar was not low Note patient carries glucose tablets with her   Reports having regular well-balanced meals, including a controlled portion size, including small nightly bedtime carbohydrate-protein snack (peanut butter and crackers) Note patient recently reported her appetite was reduced related to social  stress with separating from her husband - Previously reported noticed her weight decreasing. 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 Sig Documenting Provider Last Dose Status Informant  acetaminophen (TYLENOL) 650 MG CR tablet [id number] No Take 650 mg by mouth 2 (two) times daily as needed for pain. [provider] Taking Active   alendronate (FOSAMAX) 70 MG tablet [id number] No Take 1 tablet (70 mg total) by mouth once a week.  Take with a full glass of water on an empty stomach. [name], NP Taking Active   atorvastatin (LIPITOR) 40 MG tablet [id number] No Take 1 tablet by mouth once daily [name], NP Taking Active   blood glucose meter kit and supplies [id number] No Dispense based on patient and insurance preference. Use up to four times daily as directed. (FOR ICD-10 E10.9, E11.9). [name], FNP Taking Active   Blood Glucose Monitoring Suppl [facility] VERIO) w/Device KIT [id number] No Use to check blood sugar as directed.  DX: E11.65 [name], NP Taking Active   cetirizine (ZYRTEC) 10 MG tablet [id number]  Take 1 tablet (10 mg total) by mouth daily.  Patient taking differently: Take 10 mg by mouth daily as needed.   [name], NP  Active   Cholecalciferol (VITAMIN D3) 50 MCG (2000 UT) capsule [id number] No Take 1,000 Units by mouth daily.  [provider] Taking Active   Continuous Blood Gluc Sensor (FREESTYLE LIBRE 3 SENSOR) [place] [id number] No Place 1 sensor on the skin every 14 days DX: E11.65 [name], NP Taking Active   EPINEPHrine 0.3 mg/0.3 mL IJ SOAJ injection [id number] No Inject 0.3 mg into the muscle as needed for anaphylaxis. [name], NP Taking Active   fluticasone [facility]) 50 MCG/ACT nasal spray [id number]  Place 2 sprays into both nostrils daily. Use for 4-6 weeks then stop and use seasonally or as needed. [name], NP  Active   insulin degludec (TRESIBA FLEXTOUCH) 100 UNIT/ML FlexTouch Pen [id number] No Inject 26 Units into the skin daily.  Patient taking differently: Inject 18 Units into the skin daily.   [name], NP Taking Active   metFORMIN (GLUCOPHAGE-XR) 500 MG 24 hr tablet [id number] No Take 3 tablets (1,500 mg total) by mouth daily with breakfast. [name], NP Taking Active   omeprazole (PRILOSEC) 40 MG capsule [id number] No TAKE 1 CAPSULE BY MOUTH ONCE DAILY BEFORE BREAKFAST [name], NP Taking Active   OneTouch Delica  Lancets [id number] MISC [id number] No Use to check blood sugar as directed.  DX: E11.65 [name], NP Taking Active   [facility] ULTRA test strip [id number] No Check blood sugar 1 x daily as directed Olin Hauser, DO Taking Active   Semaglutide, 1 MG/DOSE, (OZEMPIC, 1 MG/DOSE,) 2 MG/1.5ML SOPN [id number] No INJECT 1 MG SUBCUTANEOUSLY ONCE A WEEK Baity, Coralie Keens, NP Taking Active   spironolactone (ALDACTONE) 25 MG tablet [id number] No Take 1 tablet by mouth once daily [name], NP Taking Active   vitamin B-12 (CYANOCOBALAMIN) 1000 MCG tablet [id number] No Take 1,000 mcg by mouth daily.  [provider] Taking Active               Assessment/Plan:   Diabetes: - Encourage patient to continue having regular well-balanced meals and bedtime snack, while controlling carbohydrate portion sizes - Patient to continue to use CGM as feedback on dietary choices/habits to aid with glucose control. Patient to follow up with office if needed for readings outside of established parameters or symptoms   Hypertension: - Currently controlled - Reviewed long term  cardiovascular and renal outcomes of uncontrolled blood pressure - Reviewed appropriate blood pressure monitoring technique and reviewed goal blood pressure.  - Recommended to continue to check home blood pressure and heart rate, keep log of results and have this record to review during next telephone appointment   Follow Up Plan: Clinic Pharmacist will follow up with patient by telephone on 07/04/2022 at 8:30 am   Wallace Cullens, PharmD, [place] [phone]

## 2022-04-21 NOTE — Patient Instructions (Signed)
Goals Addressed             This Visit's Progress    Pharmacy Goals       Our goal A1c is less than 7%. This corresponds with fasting sugars less than 130 and 2 hour after meal sugars less than 180. Please check your blood sugars and keep log of results  Our goal bad cholesterol, or LDL, is less than 70 . This is why it is important to continue taking your atorvastatin  Check your blood pressure once-twice weekly, and any time you have concerning symptoms like headache, chest pain, dizziness, shortness of breath, or vision changes.   Our goal is less than 130/80.  To appropriately check your blood pressure, make sure you do the following:  1) Avoid caffeine, exercise, or tobacco products for 30 minutes before checking. Empty your bladder. 2) Sit with your back supported in a flat-backed chair. Rest your arm on something flat (arm of the chair, table, etc). 3) Sit still with your feet flat on the floor, resting, for at least 5 minutes.  4) Check your blood pressure. Take 1-2 readings.  5) Write down these readings and bring with you to any provider appointments.  Bring your home blood pressure machine with you to a provider's office for accuracy comparison at least once a year.   Make sure you take your blood pressure medications before you come to any office visit, even if you were asked to fast for labs.  Feel free to call me with any questions or concerns. I look forward to our next call!  Wallace Cullens, PharmD, [place] [phone]

## 2022-04-24 ENCOUNTER — Encounter: Payer: Self-pay | Admitting: Internal Medicine

## 2022-04-24 ENCOUNTER — Ambulatory Visit (INDEPENDENT_AMBULATORY_CARE_PROVIDER_SITE_OTHER): Payer: PPO | Admitting: Internal Medicine

## 2022-04-24 VITALS — BP 116/68 | HR 76 | Temp 96.8°F | Wt 153.0 lb

## 2022-04-24 DIAGNOSIS — G8929 Other chronic pain: Secondary | ICD-10-CM

## 2022-04-24 DIAGNOSIS — M545 Low back pain, unspecified: Secondary | ICD-10-CM | POA: Diagnosis not present

## 2022-04-24 DIAGNOSIS — R350 Frequency of micturition: Secondary | ICD-10-CM | POA: Diagnosis not present

## 2022-04-24 LAB — POCT URINALYSIS DIPSTICK
Bilirubin, UA: NEGATIVE
Glucose, UA: NEGATIVE
Ketones, UA: NEGATIVE
Leukocytes, UA: NEGATIVE
Nitrite, UA: NEGATIVE
Protein, UA: NEGATIVE
Spec Grav, UA: <=1.005 — AB (ref 1.010–1.025)
Urobilinogen, UA: 0.2 E.U./dL
pH, UA: 7.5 (ref 5.0–8.0)

## 2022-04-24 NOTE — Patient Instructions (Signed)

## 2022-04-24 NOTE — Progress Notes (Signed)
HPI  Pt presents to the clinic today with c/o urinary urgency, frequency and incontinence. This started a few weeks but has worsened in the last 3 days. She denies dysuria, blood in her urine, vaginal irritation, itching, discharge or odor.  She she does have low back pain but this is a chronic issue which is worsening per her report.  Lumbar x-ray from 05/2021 showed mild degenerative changes.  She is currently managing this with Tylenol and Voltaren gel.  She has used muscle relaxers in the past and does not want to take these because she does not like the way they make her feel.  She is not currently following with orthopedics.  She denies fever, chills, nausea or vomiting.  She has not tried anything OTC for this.  She is wearing depends daily.   Review of Systems  Past Medical History:  Diagnosis Date   Allergy    Chronic pain of both knees    Diabetes mellitus without complication ([place])    Hyperlipidemia    Hypertension     Family History  Problem Relation Age of Onset   Diabetes Mother    Diabetes Father    Stroke Father    Diabetes Sister    Heart attack Other    Diabetes Sister    Diabetes Sister    Diabetes Sister     Social History   Socioeconomic History   Marital status: Married    Spouse name: Reynoso,Eduardo   Number of children: Not on file   Years of education: Not on file   Highest education level: High school graduate  Occupational History   Occupation: housekeeper    Comment: self-employed   Occupation: Self- Employed  Tobacco Use   Smoking status: Never   Smokeless tobacco: Never  Vaping Use   Vaping Use: Never used  Substance and Sexual Activity   Alcohol use: Yes    Alcohol/week: 0.0 - 1.0 standard drinks of alcohol    Comment: occasional   Drug use: Never   Sexual activity: Yes    Birth control/protection: None  Other Topics Concern   Not on file  Social History Narrative   Not on file   Social Determinants of Health   Financial  Resource Strain: Low Risk  (09/26/2021)   Overall Financial Resource Strain (CARDIA)    Difficulty of Paying Living Expenses: Not very hard  Food Insecurity: No Food Insecurity (01/21/2022)   Hunger Vital Sign    Worried About Running Out of Food in the Last Year: Never true    Ran Out of Food in the Last Year: Never true  Transportation Needs: No Transportation Needs (01/21/2022)   PRAPARE - Hydrologist (Medical): No    Lack of Transportation (Non-Medical): No  Physical Activity: Insufficiently Active (07/29/2021)   Exercise Vital Sign    Days of Exercise per Week: 2 days    Minutes of Exercise per Session: 20 min  Stress: Stress Concern Present (09/26/2021)   [place]    Feeling of Stress : To some extent  Social Connections: Moderately Isolated (09/26/2021)   Social Connection and Isolation Panel [NHANES]    Frequency of Communication with Friends and Family: More than three times a week    Frequency of Social Gatherings with Friends and Family: More than three times a week    Attends Religious Services: Never    Marine scientist or Organizations: No  Attends Archivist Meetings: Never    Marital Status: Married  Human resources officer Violence: Not At Risk (09/26/2021)   Humiliation, Afraid, Rape, and Kick questionnaire    Fear of Current or Ex-Partner: No    Emotionally Abused: No    Physically Abused: No    Sexually Abused: No    Allergies  Allergen Reactions   Aspirin Hives    Other reaction(s): Other (See Comments) Other Reaction: Not Assessed    Insulin Detemir Hives and Rash    Errythema, edema, heat at site of injection did not improve after 2 weeks of use.     Shellfish Allergy Anaphylaxis   Dapagliflozin Other (See Comments)    Yeast infections Yeast infection Farxiga   Influenza Vaccines Other (See Comments)    Forxiga   Other     Other reaction(s):  Other (See Comments) Uncoded Allergy. Allergen: Shellfish, Other Reaction: Not Assessed    Flu vaccine given 1998 caused anaphylaxis     Constitutional: Denies fever, malaise, fatigue, headache or abrupt weight changes.   GU: Pt reports urgency, frequency and incontinence. Denies dysuria, burning sensation, blood in urine, odor or discharge. Skin: Denies redness, rashes, lesions or ulcercations.  MSK: Patient reports decrease in range of motion, chronic low back pain.  Denies difficulty with gait, muscle pain or joint swelling.  No other specific complaints in a complete review of systems (except as listed in HPI above).    Objective:   Physical Exam BP 116/68 (BP Location: Left Arm, Patient Position: Sitting, Cuff Size: Normal)   Pulse 76   Temp (!) 96.8 F (36 C) (Temporal)   Wt 153 lb (69.4 kg)   SpO2 99%   BMI 27.98 kg/m   Wt Readings from Last 3 Encounters:  04/04/22 155 lb (70.3 kg)  12/02/21 158 lb (71.7 kg)  07/25/21 161 lb (73 kg)    General: Appears her stated age, overweight, in NAD. Cardiovascular: Normal rate and rhythm. S1,S2 noted.   Pulmonary/Chest: Normal effort and positive vesicular breath sounds. No respiratory distress. No wheezes, rales or ronchi noted.  Abdomen: Soft. Normal bowel sounds. No distention or masses noted.  Tender to palpation over the bladder area. No CVA tenderness. MSK: Decreased flexion, extension and rotation secondary to pain.  Bony tenderness noted over the lumbar spine.  She has difficulty getting from a sitting to a standing position.  Gait slow and steady without device.        Assessment & Plan:   Urgency, Frequency, Urinary Incontinence:  Likely OAB Urinalysis: Trace blood Will send urine culture Will stop Spironolactone to see if this improves her symptoms overall Encourage Kegel exercises and timed voiding Continue to wear depends as needed Consider medication for OAB if symptoms persist or worsen  RTC in 1 month for  annual exam Webb Silversmith, NP

## 2022-04-24 NOTE — Assessment & Plan Note (Signed)
Encourage regular stretching and core strengthening Encourage weight loss as this can produce back pain Advised her to alternate heat and ice Okay to continue Tylenol and Voltaren gel OTC as needed She is not interested in referral to orthopedics for injections She does not want to use muscle relaxers

## 2022-04-25 LAB — URINE CULTURE
MICRO NUMBER:: 14663315
Result:: NO GROWTH
SPECIMEN QUALITY:: ADEQUATE

## 2022-05-18 ENCOUNTER — Other Ambulatory Visit: Payer: Self-pay | Admitting: Internal Medicine

## 2022-05-18 DIAGNOSIS — M810 Age-related osteoporosis without current pathological fracture: Secondary | ICD-10-CM

## 2022-05-19 NOTE — Telephone Encounter (Signed)
Requested medication (s) are due for refill today: yes  Requested medication (s) are on the active medication list: yes  Last refill:  12/03/21  Future visit scheduled: yes  Notes to clinic:  Unable to refill per protocol due to failed labs, no updated results.      Requested Prescriptions  Pending Prescriptions Disp Refills   alendronate (FOSAMAX) 70 MG tablet [Pharmacy Med Name: Alendronate Sodium 70 MG Oral Tablet] 12 tablet 0    Sig: TAKE 1 TABLET BY MOUTH ONCE A WEEK WITH  A  FULL  GLASS  OF  WATER  ON  AN  EMPTY  STOMACH     Endocrinology:  Bisphosphonates Failed - 05/18/2022  6:43 AM      Failed - Vitamin D in normal range and within 360 days    No results found for: "[id number]", "[id number]", "VD125OH2TOT", "25OHVITD3", "25OHVITD2", "25OHVITD1", "VD25OH"       Failed - Mg Level in normal range and within 360 days    No results found for: "MG"       Failed - Phosphate in normal range and within 360 days    No results found for: "PHOS"       Failed - Bone Mineral Density or Dexa Scan completed in the last 2 years      Passed - Ca in normal range and within 360 days    Calcium  Date Value Ref Range Status  12/02/2021 9.3 8.6 - 10.4 mg/dL Final         Passed - Cr in normal range and within 360 days    Creat  Date Value Ref Range Status  12/02/2021 0.71 0.50 - 1.05 mg/dL Final   Creatinine, Urine  Date Value Ref Range Status  12/02/2021 34 20 - 275 mg/dL Final         Passed - eGFR is 30 or above and within 360 days    GFR, Est African American  Date Value Ref Range Status  06/10/2018 91 > OR = 60 mL/min/1.64m2 Final   GFR, Est Non African American  Date Value Ref Range Status  06/10/2018 79 > OR = 60 mL/min/1.2m2 Final   eGFR  Date Value Ref Range Status  12/02/2021 92 > OR = 60 mL/min/1.65m2 Final         Passed - Valid encounter within last 12 months    Recent Outpatient Visits           3 weeks ago Urinary frequency   [facility] [place], Coralie Keens, NP   4 weeks ago Essential hypertension   [place], Grayland Ormond A, RPH-CPP   1 month ago Viral sinusitis   [facility] [place], Coralie Keens, NP   1 month ago Essential hypertension   [place], Grayland Ormond A, RPH-CPP   1 month ago Type 2 diabetes mellitus with hyperglycemia, with long-term current use of insulin [facility])   [place], Virl Diamond, RPH-CPP       Future Appointments             In 2 weeks [place], Coralie Keens, NP [facility]

## 2022-06-04 ENCOUNTER — Encounter: Payer: Self-pay | Admitting: Internal Medicine

## 2022-06-04 ENCOUNTER — Ambulatory Visit (INDEPENDENT_AMBULATORY_CARE_PROVIDER_SITE_OTHER): Payer: PPO | Admitting: Internal Medicine

## 2022-06-04 VITALS — BP 122/80 | HR 86 | Resp 15 | Ht 62.0 in | Wt 152.8 lb

## 2022-06-04 DIAGNOSIS — Z1231 Encounter for screening mammogram for malignant neoplasm of breast: Secondary | ICD-10-CM | POA: Diagnosis not present

## 2022-06-04 DIAGNOSIS — Z6827 Body mass index (BMI) 27.0-27.9, adult: Secondary | ICD-10-CM

## 2022-06-04 DIAGNOSIS — Z780 Asymptomatic menopausal state: Secondary | ICD-10-CM | POA: Diagnosis not present

## 2022-06-04 DIAGNOSIS — E1165 Type 2 diabetes mellitus with hyperglycemia: Secondary | ICD-10-CM

## 2022-06-04 DIAGNOSIS — Z0001 Encounter for general adult medical examination with abnormal findings: Secondary | ICD-10-CM

## 2022-06-04 DIAGNOSIS — E663 Overweight: Secondary | ICD-10-CM

## 2022-06-04 NOTE — Patient Instructions (Signed)
Health Maintenance for Postmenopausal Women Menopause is a normal process in which your ability to get pregnant comes to an end. This process happens slowly over many months or years, usually between the ages of 48 and 55. Menopause is complete when you have missed your menstrual period for 12 months. It is important to talk with your health care provider about some of the most common conditions that affect women after menopause (postmenopausal women). These include heart disease, cancer, and bone loss (osteoporosis). Adopting a healthy lifestyle and getting preventive care can help to promote your health and wellness. The actions you take can also lower your chances of developing some of these common conditions. What are the signs and symptoms of menopause? During menopause, you may have the following symptoms: Hot flashes. These can be moderate or severe. Night sweats. Decrease in sex drive. Mood swings. Headaches. Tiredness (fatigue). Irritability. Memory problems. Problems falling asleep or staying asleep. Talk with your health care provider about treatment options for your symptoms. Do I need hormone replacement therapy? Hormone replacement therapy is effective in treating symptoms that are caused by menopause, such as hot flashes and night sweats. Hormone replacement carries certain risks, especially as you become older. If you are thinking about using estrogen or estrogen with progestin, discuss the benefits and risks with your health care provider. How can I reduce my risk for heart disease and stroke? The risk of heart disease, heart attack, and stroke increases as you age. One of the causes may be a change in the body's hormones during menopause. This can affect how your body uses dietary fats, triglycerides, and cholesterol. Heart attack and stroke are medical emergencies. There are many things that you can do to help prevent heart disease and stroke. Watch your blood pressure High  blood pressure causes heart disease and increases the risk of stroke. This is more likely to develop in people who have high blood pressure readings or are overweight. Have your blood pressure checked: Every 3-5 years if you are 18-39 years of age. Every year if you are 40 years old or older. Eat a healthy diet  Eat a diet that includes plenty of vegetables, fruits, low-fat dairy products, and lean protein. Do not eat a lot of foods that are high in solid fats, added sugars, or sodium. Get regular exercise Get regular exercise. This is one of the most important things you can do for your health. Most adults should: Try to exercise for at least 150 minutes each week. The exercise should increase your heart rate and make you sweat (moderate-intensity exercise). Try to do strengthening exercises at least twice each week. Do these in addition to the moderate-intensity exercise. Spend less time sitting. Even light physical activity can be beneficial. Other tips Work with your health care provider to achieve or maintain a healthy weight. Do not use any products that contain nicotine or tobacco. These products include cigarettes, chewing tobacco, and vaping devices, such as e-cigarettes. If you need help quitting, ask your health care provider. Know your numbers. Ask your health care provider to check your cholesterol and your blood sugar (glucose). Continue to have your blood tested as directed by your health care provider. Do I need screening for cancer? Depending on your health history and family history, you may need to have cancer screenings at different stages of your life. This may include screening for: Breast cancer. Cervical cancer. Lung cancer. Colorectal cancer. What is my risk for osteoporosis? After menopause, you may be   at increased risk for osteoporosis. Osteoporosis is a condition in which bone destruction happens more quickly than new bone creation. To help prevent osteoporosis or  the bone fractures that can happen because of osteoporosis, you may take the following actions: If you are 19-50 years old, get at least 1,000 mg of calcium and at least 600 international units (IU) of vitamin D per day. If you are older than age 50 but younger than age 70, get at least 1,200 mg of calcium and at least 600 international units (IU) of vitamin D per day. If you are older than age 70, get at least 1,200 mg of calcium and at least 800 international units (IU) of vitamin D per day. Smoking and drinking excessive alcohol increase the risk of osteoporosis. Eat foods that are rich in calcium and vitamin D, and do weight-bearing exercises several times each week as directed by your health care provider. How does menopause affect my mental health? Depression may occur at any age, but it is more common as you become older. Common symptoms of depression include: Feeling depressed. Changes in sleep patterns. Changes in appetite or eating patterns. Feeling an overall lack of motivation or enjoyment of activities that you previously enjoyed. Frequent crying spells. Talk with your health care provider if you think that you are experiencing any of these symptoms. General instructions See your health care provider for regular wellness exams and vaccines. This may include: Scheduling regular health, dental, and eye exams. Getting and maintaining your vaccines. These include: Influenza vaccine. Get this vaccine each year before the flu season begins. Pneumonia vaccine. Shingles vaccine. Tetanus, diphtheria, and pertussis (Tdap) booster vaccine. Your health care provider may also recommend other immunizations. Tell your health care provider if you have ever been abused or do not feel safe at home. Summary Menopause is a normal process in which your ability to get pregnant comes to an end. This condition causes hot flashes, night sweats, decreased interest in sex, mood swings, headaches, or lack  of sleep. Treatment for this condition may include hormone replacement therapy. Take actions to keep yourself healthy, including exercising regularly, eating a healthy diet, watching your weight, and checking your blood pressure and blood sugar levels. Get screened for cancer and depression. Make sure that you are up to date with all your vaccines. This information is not intended to replace advice given to you by your health care provider. Make sure you discuss any questions you have with your health care provider. Document Revised: 06/25/2020 Document Reviewed: 06/25/2020 [employer] Patient Education  2023 [employer]  

## 2022-06-04 NOTE — Progress Notes (Signed)
Subjective:    Patient ID: Summer Young, female    DOB: [birth year], 72 y.o.   MRN: [id number]  HPI  Patient presents to clinic today for her annual exam.  Flu: Never Tetanus: 06/2017 COVID: Pfizer x 3 Pneumovax: 09/2020 Prevnar: 11/2017 Zostavax: 10/2015 Shingrix: 09/2020 Pap smear: 08/2018 Mammogram: 08/2021 Bone density: 07/2019 Colon screening: 02/2018 Vision screening: annually, [facility] [place] Dentist: annually  Diet: She rarely eats meat. She consumes some fruits and veggies. She tries to avoid fried foods. She drinks mostly water, juice. Exercise: None  Review of Systems     Past Medical History:  Diagnosis Date   Allergy    Chronic pain of both knees    Diabetes mellitus without complication (HCC)    Hyperlipidemia    Hypertension     Current Outpatient Medications  Medication Sig Dispense Refill   acetaminophen (TYLENOL) 650 MG CR tablet Take 650 mg by mouth 2 (two) times daily as needed for pain.     alendronate (FOSAMAX) 70 MG tablet TAKE 1 TABLET BY MOUTH ONCE A WEEK WITH  A  FULL  GLASS  OF  WATER  ON  AN  EMPTY  STOMACH 12 tablet 0   atorvastatin (LIPITOR) 40 MG tablet Take 1 tablet by mouth once daily 90 tablet 3   blood glucose meter kit and supplies Dispense based on patient and insurance preference. Use up to four times daily as directed. (FOR ICD-10 E10.9, E11.9). 1 each 0   Blood Glucose Monitoring Suppl (ONETOUCH VERIO) w/Device KIT Use to check blood sugar as directed.  DX: E11.65 1 kit 0   cetirizine (ZYRTEC) 10 MG tablet Take 1 tablet (10 mg total) by mouth daily. (Patient taking differently: Take 10 mg by mouth daily as needed.) 30 tablet 0   Cholecalciferol (VITAMIN D3) 50 MCG (2000 UT) capsule Take 1,000 Units by mouth daily.      Continuous Blood Gluc Sensor (FREESTYLE LIBRE 3 SENSOR) MISC Place 1 sensor on the skin every 14 days DX: E11.65 2 each 12   Diclofenac Sodium 3 % GEL Apply 2-3 gm topically, 4 times daily.     EPINEPHrine 0.3  mg/0.3 mL IJ SOAJ injection Inject 0.3 mg into the muscle as needed for anaphylaxis. 1 each 1   fluticasone (FLONASE) 50 MCG/ACT nasal spray Place 2 sprays into both nostrils daily. Use for 4-6 weeks then stop and use seasonally or as needed. 16 g 0   insulin degludec (TRESIBA FLEXTOUCH) 100 UNIT/ML FlexTouch Pen Inject 26 Units into the skin daily. (Patient taking differently: Inject 18 Units into the skin daily.) 15 mL 0   metFORMIN (GLUCOPHAGE-XR) 500 MG 24 hr tablet Take 3 tablets (1,500 mg total) by mouth daily with breakfast. 270 tablet 1   omeprazole (PRILOSEC) 40 MG capsule TAKE 1 CAPSULE BY MOUTH ONCE DAILY BEFORE BREAKFAST 90 capsule 0   OneTouch Delica Lancets 33G MISC Use to check blood sugar as directed.  DX: E11.65 100 each 1   ONETOUCH ULTRA test strip Check blood sugar 1 x daily as directed 100 each 12   Semaglutide, 1 MG/DOSE, (OZEMPIC, 1 MG/DOSE,) 2 MG/1.5ML SOPN INJECT 1 MG SUBCUTANEOUSLY ONCE A WEEK 4 mL 0   spironolactone (ALDACTONE) 25 MG tablet Take 1 tablet by mouth once daily 90 tablet 1   vitamin B-12 (CYANOCOBALAMIN) 1000 MCG tablet Take 1,000 mcg by mouth daily.      No current facility-administered medications for this visit.    Allergies  Allergen  Reactions   Aspirin Hives    Other reaction(s): Other (See Comments) Other Reaction: Not Assessed    Insulin Detemir Hives and Rash    Errythema, edema, heat at site of injection did not improve after 2 weeks of use.     Shellfish Allergy Anaphylaxis   Dapagliflozin Other (See Comments)    Yeast infections Yeast infection Farxiga   Influenza Vaccines Other (See Comments)    Forxiga   Other     Other reaction(s): Other (See Comments) Uncoded Allergy. Allergen: Shellfish, Other Reaction: Not Assessed    Flu vaccine given 1998 caused anaphylaxis    Family History  Problem Relation Age of Onset   Diabetes Mother    Diabetes Father    Stroke Father    Diabetes Sister    Heart attack Other    Diabetes Sister     Diabetes Sister    Diabetes Sister     Social History   Socioeconomic History   Marital status: Married    Spouse name: Reynoso,Eduardo   Number of children: Not on file   Years of education: Not on file   Highest education level: High school graduate  Occupational History   Occupation: housekeeper    Comment: self-employed   Occupation: Self- Employed  Tobacco Use   Smoking status: Never   Smokeless tobacco: Never  Vaping Use   Vaping Use: Never used  Substance and Sexual Activity   Alcohol use: Yes    Alcohol/week: 0.0 - 1.0 standard drinks of alcohol    Comment: occasional   Drug use: Never   Sexual activity: Yes    Birth control/protection: None  Other Topics Concern   Not on file  Social History Narrative   Not on file   Social Determinants of Health   Financial Resource Strain: Low Risk  (09/26/2021)   Overall Financial Resource Strain (CARDIA)    Difficulty of Paying Living Expenses: Not very hard  Food Insecurity: No Food Insecurity (01/21/2022)   Hunger Vital Sign    Worried About Running Out of Food in the Last Year: Never true    Ran Out of Food in the Last Year: Never true  Transportation Needs: No Transportation Needs (01/21/2022)   PRAPARE - Administrator, Civil Service (Medical): No    Lack of Transportation (Non-Medical): No  Physical Activity: Insufficiently Active (07/29/2021)   Exercise Vital Sign    Days of Exercise per Week: 2 days    Minutes of Exercise per Session: 20 min  Stress: Stress Concern Present (09/26/2021)   [employer] of Occupational Health - Occupational Stress Questionnaire    Feeling of Stress : To some extent  Social Connections: Moderately Isolated (09/26/2021)   Social Connection and Isolation Panel [NHANES]    Frequency of Communication with Friends and Family: More than three times a week    Frequency of Social Gatherings with Friends and Family: More than three times a week    Attends Religious  Services: Never    Database administrator or Organizations: No    Attends Banker Meetings: Never    Marital Status: Married  Catering manager Violence: Not At Risk (09/26/2021)   Humiliation, Afraid, Rape, and Kick questionnaire    Fear of Current or Ex-Partner: No    Emotionally Abused: No    Physically Abused: No    Sexually Abused: No     Constitutional: Denies fever, malaise, fatigue, headache or abrupt weight changes.  HEENT: Denies eye  pain, eye redness, ear pain, ringing in the ears, wax buildup, runny nose, nasal congestion, bloody nose, or sore throat. Respiratory: Denies difficulty breathing, shortness of breath, cough or sputum production.   Cardiovascular: Denies chest pain, chest tightness, palpitations or swelling in the hands or feet.  Gastrointestinal: Denies abdominal pain, bloating, constipation, diarrhea or blood in the stool.  GU: Denies urgency, frequency, pain with urination, burning sensation, blood in urine, odor or discharge. Musculoskeletal: Patient reports joint pain.  Denies decrease in range of motion, difficulty with gait, muscle pain or joint swelling.  Skin: Denies redness, rashes, lesions or ulcercations.  Neurological: Denies dizziness, difficulty with memory, difficulty with speech or problems with balance and coordination.  Psych: Denies anxiety, depression, SI/HI.  No other specific complaints in a complete review of systems (except as listed in HPI above).  Objective:   Physical Exam  BP 122/80 (BP Location: Right Arm, Patient Position: Sitting, Cuff Size: Normal)   Pulse 86   Resp 15   Ht  (1.575 m)   Wt 152 lb 12.8 oz (69.3 kg)   SpO2 98%   BMI 27.95 kg/m   Wt Readings from Last 3 Encounters:  04/24/22 153 lb (69.4 kg)  04/04/22 155 lb (70.3 kg)  12/02/21 158 lb (71.7 kg)    General: Appears her stated age, overweight, in NAD. Skin: Warm, dry and intact. No ulcerations noted. HEENT: Head: normal shape and size;  Eyes: sclera white, no icterus, conjunctiva pink, PERRLA and EOMs intact;   Neck:  Neck supple, trachea midline. No masses, lumps or thyromegaly present.  Cardiovascular: Normal rate and rhythm. S1,S2 noted.  No murmur, rubs or gallops noted. No JVD or BLE edema. No carotid bruits noted. Pulmonary/Chest: Normal effort and positive vesicular breath sounds. No respiratory distress. No wheezes, rales or ronchi noted.  Abdomen: Normal bowel sounds.  Musculoskeletal: Bony protuberance noted of the right sternoclavicular joint.  Strength 5/5 BUE/BLE.  No difficulty with gait.  Neurological: Alert and oriented. Cranial nerves II-XII grossly intact. Coordination normal.  Psychiatric: Mood and affect normal. Behavior is normal. Judgment and thought content normal.     BMET    Component Value Date/Time   NA 140 12/02/2021 0836   K 4.2 12/02/2021 0836   CL 105 12/02/2021 0836   CO2 24 12/02/2021 0836   GLUCOSE 88 12/02/2021 0836   BUN 14 12/02/2021 0836   CREATININE 0.71 12/02/2021 0836   CALCIUM 9.3 12/02/2021 0836   GFRNONAA 79 06/10/2018 0824   GFRAA 91 06/10/2018 0824    Lipid Panel     Component Value Date/Time   CHOL 111 12/02/2021 0836   TRIG 72 12/02/2021 0836   HDL 50 12/02/2021 0836   CHOLHDL 2.2 12/02/2021 0836   LDLCALC 46 12/02/2021 0836    CBC    Component Value Date/Time   WBC 9.5 07/25/2021 1554   RBC 4.84 07/25/2021 1554   HGB 13.3 07/25/2021 1554   HCT 40.8 07/25/2021 1554   PLT 372 07/25/2021 1554   MCV 84.3 07/25/2021 1554   MCH 27.5 07/25/2021 1554   MCHC 32.6 07/25/2021 1554   RDW 13.5 07/25/2021 1554   LYMPHSABS 2,967 04/12/2019 0818   EOSABS 95 04/12/2019 0818   BASOSABS 34 04/12/2019 0818    Hgb A1C Lab Results  Component Value Date   HGBA1C 7.2 (A) 12/02/2021            Assessment & Plan:   Preventative Health Maintenance:  Encouraged her to get a  flu shot in the fall Tetanus UTD Encouraged her to get her COVID booster Pneumovax  and Prevnar UTD Zostavax UTD Encouraged her to get her second Shingrix She no longer needs to screen for cervical cancer Mammogram and bone density ordered-she will call to schedule Colon screening UTD Encouraged her to consume a balanced diet and exercise regimen Advised her seeing eye doctor and dentist annually We will check CBC, c-Met, lipid, A1c today  RTC in 6 months, follow-up chronic conditions Nicki Reaper, NP

## 2022-06-04 NOTE — Assessment & Plan Note (Signed)
Encourage diet and exercise for weight loss 

## 2022-06-05 LAB — COMPLETE METABOLIC PANEL WITH GFR
AG Ratio: 1.8 (calc) (ref 1.0–2.5)
ALT: 18 U/L (ref 6–29)
AST: 17 U/L (ref 10–35)
Albumin: 4.2 g/dL (ref 3.6–5.1)
Alkaline phosphatase (APISO): 94 U/L (ref 37–153)
BUN: 15 mg/dL (ref 7–25)
CO2: 24 mmol/L (ref 20–32)
Calcium: 9 mg/dL (ref 8.6–10.4)
Chloride: 108 mmol/L (ref 98–110)
Creat: 0.67 mg/dL (ref 0.50–1.05)
Globulin: 2.4 g/dL (calc) (ref 1.9–3.7)
Glucose, Bld: 100 mg/dL — ABNORMAL HIGH (ref 65–99)
Potassium: 4.4 mmol/L (ref 3.5–5.3)
Sodium: 140 mmol/L (ref 135–146)
Total Bilirubin: 0.5 mg/dL (ref 0.2–1.2)
Total Protein: 6.6 g/dL (ref 6.1–8.1)
eGFR: 95 mL/min/{1.73_m2} (ref >=60)

## 2022-06-05 LAB — CBC
HCT: 39.5 % (ref 35.0–45.0)
Hemoglobin: 12.6 g/dL (ref 11.7–15.5)
MCH: 26.9 pg — ABNORMAL LOW (ref 27.0–33.0)
MCHC: 31.9 g/dL — ABNORMAL LOW (ref 32.0–36.0)
MCV: 84.2 fL (ref 80.0–100.0)
MPV: 10 fL (ref 7.5–12.5)
Platelets: 341 10*3/uL (ref 140–400)
RBC: 4.69 10*6/uL (ref 3.80–5.10)
RDW: 13.5 % (ref 11.0–15.0)
WBC: 8.6 10*3/uL (ref 3.8–10.8)

## 2022-06-05 LAB — HEMOGLOBIN A1C
Hgb A1c MFr Bld: 7.2 % of total Hgb — ABNORMAL HIGH (ref <5.7)
Mean Plasma Glucose: 160 mg/dL
eAG (mmol/L): 8.9 mmol/L

## 2022-06-05 LAB — LIPID PANEL
Cholesterol: 106 mg/dL (ref <200)
HDL: 53 mg/dL (ref >=50)
LDL Cholesterol (Calc): 40 mg/dL (calc)
Non-HDL Cholesterol (Calc): 53 mg/dL (calc) (ref <130)
Total CHOL/HDL Ratio: 2 (calc) (ref <5.0)
Triglycerides: 44 mg/dL (ref <150)

## 2022-06-06 ENCOUNTER — Encounter: Payer: Self-pay | Admitting: Internal Medicine

## 2022-06-11 ENCOUNTER — Encounter: Payer: Self-pay | Admitting: Internal Medicine

## 2022-06-18 ENCOUNTER — Telehealth: Payer: Self-pay

## 2022-06-18 NOTE — Telephone Encounter (Signed)
Samples of tresiba and ozempic received for patient.  She is aware and states she does not need the tresiba.  She would like the clinic to keep it for other patients.

## 2022-06-20 DIAGNOSIS — M546 Pain in thoracic spine: Secondary | ICD-10-CM | POA: Diagnosis not present

## 2022-06-20 DIAGNOSIS — M9901 Segmental and somatic dysfunction of cervical region: Secondary | ICD-10-CM | POA: Diagnosis not present

## 2022-06-20 DIAGNOSIS — M542 Cervicalgia: Secondary | ICD-10-CM | POA: Diagnosis not present

## 2022-06-20 DIAGNOSIS — M9902 Segmental and somatic dysfunction of thoracic region: Secondary | ICD-10-CM | POA: Diagnosis not present

## 2022-06-24 DIAGNOSIS — M546 Pain in thoracic spine: Secondary | ICD-10-CM | POA: Diagnosis not present

## 2022-06-24 DIAGNOSIS — M9901 Segmental and somatic dysfunction of cervical region: Secondary | ICD-10-CM | POA: Diagnosis not present

## 2022-06-24 DIAGNOSIS — M542 Cervicalgia: Secondary | ICD-10-CM | POA: Diagnosis not present

## 2022-06-24 DIAGNOSIS — M9902 Segmental and somatic dysfunction of thoracic region: Secondary | ICD-10-CM | POA: Diagnosis not present

## 2022-07-02 DIAGNOSIS — M9902 Segmental and somatic dysfunction of thoracic region: Secondary | ICD-10-CM | POA: Diagnosis not present

## 2022-07-02 DIAGNOSIS — M542 Cervicalgia: Secondary | ICD-10-CM | POA: Diagnosis not present

## 2022-07-02 DIAGNOSIS — M546 Pain in thoracic spine: Secondary | ICD-10-CM | POA: Diagnosis not present

## 2022-07-02 DIAGNOSIS — M9901 Segmental and somatic dysfunction of cervical region: Secondary | ICD-10-CM | POA: Diagnosis not present

## 2022-07-04 ENCOUNTER — Ambulatory Visit: Payer: PPO | Admitting: Pharmacist

## 2022-07-04 ENCOUNTER — Encounter: Payer: Self-pay | Admitting: Pharmacist

## 2022-07-04 DIAGNOSIS — E1165 Type 2 diabetes mellitus with hyperglycemia: Secondary | ICD-10-CM

## 2022-07-04 NOTE — Progress Notes (Signed)
07/04/2022 Name: Renona Mcnab MRN: [id number] DOB: Nov 06, 1950  Chief Complaint  Patient presents with   Medication Management   Medication Assistance    Agathe Mccluney is a 72 y.o. year old female who presented for a telephone visit.   They were referred to the pharmacist by their PCP for assistance in managing diabetes, hypertension, hyperlipidemia, and medication access.   Subjective:  Care Team: Primary Care Provider: Lorre Munroe, NP ; Next Scheduled Visit: 12/04/2022  Medication Access/Adherence  Current Pharmacy:  [facility] [address], [place] - [address] [place] [address] Phone: [phone] Fax: [phone]   Patient reports affordability concerns with their medications: No  Patient reports access/transportation concerns to their pharmacy: No  Patient reports adherence concerns with their medications:  No       Type 2 Diabetes:   Current medications:  Tresiba 18 units once daily Metformin ER 500 mg - 3 tablets daily with breakfast Ozempic 1 mg once weekly   Patient using Freestyle Libre 3 continuous blood glucose monitor Using her iPhone in place of reader devices Review results from Freestyle LibreView:     Denies recent hypoglycemia. Reports her last CGM sensor was having frequent errors/signal loss and had to replace it early, but doing well since Note patient carries glucose tablets with her Plans to follow up with manufacturer for replacement sensor   Reports having regular well-balanced meals, including a controlled portion size, including small nightly bedtime carbohydrate-protein snack (peanut butter and crackers) - Reports paying close attention to her diet in order to control her blood sugar   Current physical activity: patient active throughout the day at work   Statin: atorvastatin 40 mg daily     Current medication access support: Enrolled in [place] and Ozempic medication assistance from [employer]  for 2024 calendar year  Today reports that she has changed insurance plans, now has [employer] (card scanned into chart)   Objective:  Lab Results  Component Value Date   HGBA1C 7.2 (H) 06/04/2022    Lab Results  Component Value Date   CREATININE 0.67 06/04/2022   BUN 15 06/04/2022   NA 140 06/04/2022   K 4.4 06/04/2022   CL 108 06/04/2022   CO2 24 06/04/2022    Lab Results  Component Value Date   CHOL 106 06/04/2022   HDL 53 06/04/2022   LDLCALC 40 06/04/2022   TRIG 44 06/04/2022   CHOLHDL 2.0 06/04/2022    Medications Reviewed Today     Reviewed by Lorre Munroe, NP (Nurse Practitioner) on 06/04/22 at [id number]  Med List Status: <None>   Medication Order Taking? Sig Documenting Provider Last Dose Status Informant  acetaminophen (TYLENOL) 650 MG CR tablet [id number] Yes Take 650 mg by mouth 2 (two) times daily as needed for pain. [provider] Taking Active   alendronate (FOSAMAX) 70 MG tablet [id number] Yes TAKE 1 TABLET BY MOUTH ONCE A WEEK WITH  A  FULL  GLASS  OF  WATER  ON  AN  EMPTY  STOMACH Baity, Salvadore Oxford, NP Taking Active   atorvastatin (LIPITOR) 40 MG tablet [id number] Yes Take 1 tablet by mouth once daily Lorre Munroe, NP Taking Active   blood glucose meter kit and supplies [id number] Yes Dispense based on patient and insurance preference. Use up to four times daily as directed. (FOR ICD-10 E10.9, E11.9). Tarri Fuller, FNP Taking Active   Blood Glucose Monitoring Suppl [facility] VERIO) w/Device Andria Rhein [id number] Yes  Use to check blood sugar as directed.  DX: E11.65 Lorre Munroe, NP Taking Active   cetirizine (ZYRTEC) 10 MG tablet [id number] No Take 1 tablet (10 mg total) by mouth daily.  Patient not taking: Reported on 06/04/2022   Lorre Munroe, NP Not Taking Active   Cholecalciferol (VITAMIN D3) 50 MCG (2000 UT) capsule [id number] Yes Take 1,000 Units by mouth daily.  [provider] Taking Active   Continuous Blood Gluc Sensor  (FREESTYLE LIBRE 3 SENSOR) [place] [id number] Yes Place 1 sensor on the skin every 14 days DX: E11.65 Lorre Munroe, NP Taking Active   Diclofenac Sodium 3 % GEL [id number] Yes Apply 2-3 gm topically, 4 times daily. [provider] Taking Active   EPINEPHrine 0.3 mg/0.3 mL IJ SOAJ injection [id number] Yes Inject 0.3 mg into the muscle as needed for anaphylaxis. Lorre Munroe, NP Taking Active   fluticasone [facility]) 50 MCG/ACT nasal spray [id number] Yes Place 2 sprays into both nostrils daily. Use for 4-6 weeks then stop and use seasonally or as needed. Lorre Munroe, NP Taking Active   insulin degludec (TRESIBA FLEXTOUCH) 100 UNIT/ML FlexTouch Pen [id number] Yes Inject 26 Units into the skin daily.  Patient taking differently: Inject 18 Units into the skin daily.   Lorre Munroe, NP Taking Active   metFORMIN (GLUCOPHAGE-XR) 500 MG 24 hr tablet [id number] Yes Take 3 tablets (1,500 mg total) by mouth daily with breakfast. Lorre Munroe, NP Taking Active   omeprazole (PRILOSEC) 40 MG capsule [id number] Yes TAKE 1 CAPSULE BY MOUTH ONCE DAILY BEFORE BREAKFAST Lorre Munroe, NP Taking Active   OneTouch Delica Lancets 33G MISC [id number] Yes Use to check blood sugar as directed.  DX: E11.65 Lorre Munroe, NP Taking Active   [facility] ULTRA test strip [id number] Yes Check blood sugar 1 x daily as directed Smitty Cords, DO Taking Active   Semaglutide, 1 MG/DOSE, (OZEMPIC, 1 MG/DOSE,) 2 MG/1.5ML SOPN [id number] Yes INJECT 1 MG SUBCUTANEOUSLY ONCE A WEEK Baity, Salvadore Oxford, NP Taking Active   vitamin B-12 (CYANOCOBALAMIN) 1000 MCG tablet [id number] Yes Take 1,000 mcg by mouth daily.  [provider] Taking Active               Assessment/Plan:   Diabetes: - Encourage patient to have regular well-balanced meals and bedtime snack, while controlling carbohydrate portion sizes Will send patient handout on healthy meal planning via MyChart - Patient to continue to use CGM as  feedback on dietary choices/habits to aid with glucose control. Patient to follow up with office if needed for readings outside of established parameters or symptoms  With new Medicare plan coverage, patient will need a new prior authorization for CGM - Patient interested in switching from [facility] 3 to [facility] G7  Will collaborate with PCP  Follow Up Plan: Clinic Pharmacist will follow up with patient by telephone within the next 30 days   Estelle Grumbles, PharmD, [facility] Clinical Pharmacist [facility] [phone]

## 2022-07-04 NOTE — Patient Instructions (Signed)
Goals Addressed             This Visit's Progress    Pharmacy Goals       Our goal A1c is less than 7%. This corresponds with fasting sugars less than 130 and 2 hour after meal sugars less than 180. Please check your blood sugars and keep log of results  Our goal bad cholesterol, or LDL, is less than 70 . This is why it is important to continue taking your atorvastatin  Check your blood pressure once-twice weekly, and any time you have concerning symptoms like headache, chest pain, dizziness, shortness of breath, or vision changes.   Our goal is less than 130/80.  To appropriately check your blood pressure, make sure you do the following:  1) Avoid caffeine, exercise, or tobacco products for 30 minutes before checking. Empty your bladder. 2) Sit with your back supported in a flat-backed chair. Rest your arm on something flat (arm of the chair, table, etc). 3) Sit still with your feet flat on the floor, resting, for at least 5 minutes.  4) Check your blood pressure. Take 1-2 readings.  5) Write down these readings and bring with you to any provider appointments.  Bring your home blood pressure machine with you to a provider's office for accuracy comparison at least once a year.   Make sure you take your blood pressure medications before you come to any office visit, even if you were asked to fast for labs.   Feel free to call me with any questions or concerns. I look forward to our next call!   Ardit Danh Maleigh Bagot, PharmD, BCACP Clinical Pharmacist [facility] [phone]         

## 2022-07-08 ENCOUNTER — Other Ambulatory Visit: Payer: Self-pay | Admitting: Internal Medicine

## 2022-07-08 MED ORDER — DEXCOM G6 SENSOR MISC: 5 refills | Status: DC

## 2022-07-08 NOTE — Telephone Encounter (Signed)
Requested medication (s) are due for refill today - no  Requested medication (s) are on the active medication list -yes  Future visit scheduled -yes  Last refill: 08/14/21 2 each 12RF  Notes to clinic: Patient has 2 different senssors on medication list- other sensor brand recently filled- sent for review   Requested Prescriptions  Pending Prescriptions Disp Refills   Continuous Glucose Sensor (FREESTYLE LIBRE 3 SENSOR) MISC [Pharmacy Med Name: FREESTYLE LIBRE 3 SENSOR KIT] 2 each 0    Sig: USE 1 SENSOR EVERY 14 DAYS AS DIRECTED     Endocrinology: Diabetes - Testing Supplies Passed - 07/08/2022  6:43 AM      Passed - Valid encounter within last 12 months    Recent Outpatient Visits           4 days ago Type 2 diabetes mellitus with hyperglycemia, without long-term current use of insulin (HCC)   [place] [facility] Delles, Gentry Fitz A, RPH-CPP   1 month ago Encounter for general adult medical examination with abnormal findings   [facility] [place], Salvadore Oxford, NP   2 months ago Urinary frequency   [place] [facility] [place], Salvadore Oxford, NP   2 months ago Essential hypertension   [place] [facility] D[facility], Gentry Fitz A, RPH-CPP   3 months ago Viral sinusitis   [place] [facility] [place], Salvadore Oxford, NP       Future Appointments             In 4 months Baity, Salvadore Oxford, NP [facility] [facility], [facility]               Requested Prescriptions  Pending Prescriptions Disp Refills   Continuous Glucose Sensor (FREESTYLE LIBRE 3 SENSOR) MISC [Pharmacy Med Name: FREESTYLE LIBRE 3 SENSOR KIT] 2 each 0    Sig: USE 1 SENSOR EVERY 14 DAYS AS DIRECTED     Endocrinology: Diabetes - Testing Supplies Passed - 07/08/2022  6:43 AM      Passed - Valid encounter within last 12 months    Recent Outpatient Visits           4 days ago Type 2 diabetes mellitus with  hyperglycemia, without long-term current use of insulin (HCC)   [facility] Delles, Jackelyn Poling, RPH-CPP   1 month ago Encounter for general adult medical examination with abnormal findings   [place] [facility] [place], Salvadore Oxford, NP   2 months ago Urinary frequency   [facility] [place], Salvadore Oxford, NP   2 months ago Essential hypertension   [place] [facility] Delles, Gentry Fitz A, RPH-CPP   3 months ago Viral sinusitis   [place] [facility] [place], Salvadore Oxford, NP       Future Appointments             In 4 months Baity, Salvadore Oxford, NP  [facility], [facility]

## 2022-07-09 ENCOUNTER — Other Ambulatory Visit: Payer: Self-pay | Admitting: Internal Medicine

## 2022-07-09 ENCOUNTER — Other Ambulatory Visit: Payer: Self-pay | Admitting: Pharmacist

## 2022-07-09 ENCOUNTER — Telehealth: Payer: Self-pay

## 2022-07-09 ENCOUNTER — Telehealth: Payer: Self-pay | Admitting: Pharmacist

## 2022-07-09 ENCOUNTER — Other Ambulatory Visit (HOSPITAL_COMMUNITY): Payer: Self-pay

## 2022-07-09 DIAGNOSIS — M542 Cervicalgia: Secondary | ICD-10-CM | POA: Diagnosis not present

## 2022-07-09 DIAGNOSIS — M9901 Segmental and somatic dysfunction of cervical region: Secondary | ICD-10-CM | POA: Diagnosis not present

## 2022-07-09 DIAGNOSIS — M9902 Segmental and somatic dysfunction of thoracic region: Secondary | ICD-10-CM | POA: Diagnosis not present

## 2022-07-09 DIAGNOSIS — M546 Pain in thoracic spine: Secondary | ICD-10-CM | POA: Diagnosis not present

## 2022-07-09 MED ORDER — FREESTYLE LIBRE 3 SENSOR MISC: 1 refills | Status: DC

## 2022-07-09 MED ORDER — DEXCOM G7 SENSOR MISC: 0 refills | Status: DC

## 2022-07-09 NOTE — Telephone Encounter (Signed)
Patient's previous prescription for Freestyle Libre 3 CGM sensors is out of refills. Would you please consider sending renewal for Freestyle Libre 3 sensors to pharmacy for patient?   Note: patient was going to try switch to [facility] G7 sensors, but cost is currently unaffordable through her plan.  Thank you!  Estelle Grumbles, PharmD, [facility] Clinical Pharmacist [facility] [phone]

## 2022-07-09 NOTE — Telephone Encounter (Signed)
Pharmacy Patient Advocate Encounter  Insurance verification completed.    The patient is insured through [facility]   The patient is currently admitted and ran test claims for the following: Dexcom G7 and Freestyle [employer].  Copays and coinsurance results were relayed to Inpatient clinical team.

## 2022-07-09 NOTE — Patient Instructions (Signed)
Goals Addressed             This Visit's Progress    Pharmacy Goals       Our goal A1c is less than 7%. This corresponds with fasting sugars less than 130 and 2 hour after meal sugars less than 180. Please check your blood sugars and keep log of results  Our goal bad cholesterol, or LDL, is less than 70 . This is why it is important to continue taking your atorvastatin  Check your blood pressure periodically, and any time you have concerning symptoms like headache, chest pain, dizziness, shortness of breath, or vision changes.   Our goal is less than 130/80.  To appropriately check your blood pressure, make sure you do the following:  1) Avoid caffeine, exercise, or tobacco products for 30 minutes before checking. Empty your bladder. 2) Sit with your back supported in a flat-backed chair. Rest your arm on something flat (arm of the chair, table, etc). 3) Sit still with your feet flat on the floor, resting, for at least 5 minutes.  4) Check your blood pressure. Take 1-2 readings.  5) Write down these readings and bring with you to any provider appointments.  Bring your home blood pressure machine with you to a provider's office for accuracy comparison at least once a year.   Make sure you take your blood pressure medications before you come to any office visit, even if you were asked to fast for labs.  Feel free to call me with any questions or concerns. I look forward to our next call!  Estelle Grumbles, PharmD, [facility] Clinical Pharmacist [facility] [phone]

## 2022-07-09 NOTE — Progress Notes (Signed)
   07/09/2022  Patient ID: Yvonne Kendall, female   DOB: 30-Apr-1950, 72 y.o.   MRN: [id number]  Submitted prior authorization request to patient's [facility] health plan for Dexcom G7 Continuous Glucose Monitor (CGM) sensors. Receive response from plan - PA approved through 02/17/2023  Collaborate with PCP and provider sends Rx for Dexcom G7 sensors to pharmacy for patient.  Follow up with [facility] regarding cost and with [facility] for investigation of cost of Dexcom G7 versus Freestyle Libre 3 CGM options for patient. Per test claims today: - Dexcom G7 sensors for 1 month supply: $68.80 - Freestyle Libre 3 sensors for 1 month supply: $25.65  Provide update to patient. Patient prefers to continue using [employer] 3 due to cost difference,  Psychologist, forensic advises patient's prescription for [employer] 3 sensors is currently out of refills - Will collaborate with PCP to ask provider to send renewal for [employer] 3 sensors to [employer].  Follow Up Plan: Clinic Pharmacist will follow up with patient by telephone again in 2 months   Estelle Grumbles, PharmD, [facility] Clinical Pharmacist [facility] [phone]

## 2022-07-09 NOTE — Telephone Encounter (Signed)
RX sent to pt's pharmacy.   Thanks,   -Menna Abeln  

## 2022-07-23 DIAGNOSIS — M542 Cervicalgia: Secondary | ICD-10-CM | POA: Diagnosis not present

## 2022-07-23 DIAGNOSIS — M9901 Segmental and somatic dysfunction of cervical region: Secondary | ICD-10-CM | POA: Diagnosis not present

## 2022-07-23 DIAGNOSIS — M9902 Segmental and somatic dysfunction of thoracic region: Secondary | ICD-10-CM | POA: Diagnosis not present

## 2022-07-23 DIAGNOSIS — M546 Pain in thoracic spine: Secondary | ICD-10-CM | POA: Diagnosis not present

## 2022-08-28 ENCOUNTER — Ambulatory Visit (INDEPENDENT_AMBULATORY_CARE_PROVIDER_SITE_OTHER): Payer: Medicare HMO

## 2022-08-28 VITALS — Ht 62.0 in | Wt 152.0 lb

## 2022-08-28 DIAGNOSIS — Z0000 Encounter for general adult medical examination without abnormal findings: Secondary | ICD-10-CM | POA: Diagnosis not present

## 2022-08-28 NOTE — Progress Notes (Signed)
Subjective:   Summer Young is a 72 y.o. female who presents for Medicare Annual (Subsequent) preventive examination.  Visit Complete: Virtual  I connected with  Yvonne Kendall on 08/28/22 by a audio enabled telemedicine application and verified that I am speaking with the correct person using two identifiers.  Patient Location: Home  Provider Location: Office/Clinic  I discussed the limitations of evaluation and management by telemedicine. The patient expressed understanding and agreed to proceed.  Review of Systems     Cardiac Risk Factors include: advanced age (>52men, >38 women);diabetes mellitus;dyslipidemia;sedentary lifestyle     Objective:    Today's Vitals   08/28/22 1514  Weight: 152 lb (68.9 kg)  Height: 5\' 2"  (1.575 m)   Body mass index is 27.8 kg/m.     08/28/2022    3:04 PM 05/08/2020    2:06 PM 09/16/2018   11:28 AM 02/26/2018    3:35 PM 01/29/2018    9:17 AM 01/19/2018   12:19 PM  Advanced Directives  Does Patient Have a Medical Advance Directive? No No No No No No  Does patient want to make changes to medical advance directive?   No - Patient declined     Would patient like information on creating a medical advance directive? No - Patient declined   No - Patient declined Yes (MAU/Ambulatory/Procedural Areas - Information given) Yes (MAU/Ambulatory/Procedural Areas - Information given)    Current Medications (verified) Outpatient Encounter Medications as of 08/28/2022  Medication Sig   acetaminophen (TYLENOL) 650 MG CR tablet Take 650 mg by mouth 2 (two) times daily as needed for pain.   alendronate (FOSAMAX) 70 MG tablet TAKE 1 TABLET BY MOUTH ONCE A WEEK WITH  A  FULL  GLASS  OF  WATER  ON  AN  EMPTY  STOMACH   atorvastatin (LIPITOR) 40 MG tablet Take 1 tablet by mouth once daily   blood glucose meter kit and supplies Dispense based on patient and insurance preference. Use up to four times daily as directed. (FOR ICD-10 E10.9, E11.9).   Blood Glucose  Monitoring Suppl (ONETOUCH VERIO) w/Device KIT Use to check blood sugar as directed.  DX: E11.65   Cholecalciferol (VITAMIN D3) 50 MCG (2000 UT) capsule Take 1,000 Units by mouth daily.    Continuous Glucose Sensor (FREESTYLE LIBRE 3 SENSOR) MISC Place 1 sensor on the skin every 14 days DX: E11.65   Diclofenac Sodium 3 % GEL Apply 2-3 gm topically, 4 times daily.   EPINEPHrine 0.3 mg/0.3 mL IJ SOAJ injection Inject 0.3 mg into the muscle as needed for anaphylaxis.   insulin degludec (TRESIBA FLEXTOUCH) 100 UNIT/ML FlexTouch Pen Inject 26 Units into the skin daily. (Patient taking differently: Inject 18 Units into the skin daily.)   metFORMIN (GLUCOPHAGE-XR) 500 MG 24 hr tablet Take 3 tablets (1,500 mg total) by mouth daily with breakfast.   omeprazole (PRILOSEC) 40 MG capsule TAKE 1 CAPSULE BY MOUTH ONCE DAILY BEFORE BREAKFAST   OneTouch Delica Lancets 33G MISC Use to check blood sugar as directed.  DX: E11.65   ONETOUCH ULTRA test strip Check blood sugar 1 x daily as directed   Semaglutide, 1 MG/DOSE, (OZEMPIC, 1 MG/DOSE,) 2 MG/1.5ML SOPN INJECT 1 MG SUBCUTANEOUSLY ONCE A WEEK   vitamin B-12 (CYANOCOBALAMIN) 1000 MCG tablet Take 1,000 mcg by mouth daily.    cetirizine (ZYRTEC) 10 MG tablet Take 1 tablet (10 mg total) by mouth daily. (Patient not taking: Reported on 06/04/2022)   fluticasone (FLONASE) 50 MCG/ACT nasal spray Place  2 sprays into both nostrils daily. Use for 4-6 weeks then stop and use seasonally or as needed. (Patient not taking: Reported on 08/28/2022)   No facility-administered encounter medications on file as of 08/28/2022.    Allergies (verified) Aspirin, Insulin detemir, Shellfish allergy, Dapagliflozin, Influenza vaccines, and Other   History: Past Medical History:  Diagnosis Date   Allergy    Chronic pain of both knees    Diabetes mellitus without complication (HCC)    Hyperlipidemia    Hypertension    Past Surgical History:  Procedure Laterality Date   ANKLE  SURGERY Left 2009   DILATION AND CURETTAGE, DIAGNOSTIC / THERAPEUTIC  1998   miscarriage   KNEE SURGERY Left 1994   ACL   Family History  Problem Relation Age of Onset   Diabetes Mother    Diabetes Father    Stroke Father    Diabetes Sister    Heart attack Other    Diabetes Sister    Diabetes Sister    Diabetes Sister    Social History   Socioeconomic History   Marital status: Married    Spouse name: Reynoso,Eduardo   Number of children: Not on file   Years of education: Not on file   Highest education level: High school graduate  Occupational History   Occupation: housekeeper    Comment: self-employed   Occupation: Self- Employed  Tobacco Use   Smoking status: Never   Smokeless tobacco: Never  Vaping Use   Vaping status: Never Used  Substance and Sexual Activity   Alcohol use: Yes    Alcohol/week: 0.0 - 1.0 standard drinks of alcohol    Comment: occasional   Drug use: Never   Sexual activity: Yes    Birth control/protection: None  Other Topics Concern   Not on file  Social History Narrative   Not on file   Social Determinants of Health   Financial Resource Strain: Medium Risk (08/28/2022)   Overall Financial Resource Strain (CARDIA)    Difficulty of Paying Living Expenses: Somewhat hard  Food Insecurity: No Food Insecurity (08/28/2022)   Hunger Vital Sign    Worried About Running Out of Food in the Last Year: Never true    Ran Out of Food in the Last Year: Never true  Transportation Needs: No Transportation Needs (08/28/2022)   PRAPARE - Administrator, Civil Service (Medical): No    Lack of Transportation (Non-Medical): No  Physical Activity: Inactive (08/28/2022)   Exercise Vital Sign    Days of Exercise per Week: 0 days    Minutes of Exercise per Session: 0 min  Stress: Stress Concern Present (08/28/2022)   [employer] of Occupational Health - Occupational Stress Questionnaire    Feeling of Stress : Rather much  Social Connections:  Moderately Isolated (08/28/2022)   Social Connection and Isolation Panel [NHANES]    Frequency of Communication with Friends and Family: More than three times a week    Frequency of Social Gatherings with Friends and Family: More than three times a week    Attends Religious Services: More than 4 times per year    Active Member of [employer] or Organizations: No    Attends Banker Meetings: Never    Marital Status: Separated    Tobacco Counseling Counseling given: Not Answered   Clinical Intake:  Pre-visit preparation completed: Yes  Pain : No/denies pain     Nutritional Risks: None Diabetes: Yes CBG done?: No Did pt. bring in CBG monitor from  home?: No  How often do you need to have someone help you when you read instructions, pamphlets, or other written materials from your doctor or pharmacy?: 1 - Never  Interpreter Needed?: No  Information entered by :: Kennedy Bucker, LPN   Activities of Daily Living    08/28/2022    3:05 PM 06/04/2022    8:31 AM  In your present state of health, do you have any difficulty performing the following activities:  Hearing? 0 0  Vision? 0 0  Difficulty concentrating or making decisions? 0 0  Walking or climbing stairs? 1 0  Dressing or bathing? 0 0  Doing errands, shopping? 0 0  Preparing Food and eating ? N   Using the Toilet? N   In the past six months, have you accidently leaked urine? N   Do you have problems with loss of bowel control? N   Managing your Medications? N   Managing your Finances? N   Housekeeping or managing your Housekeeping? N     Patient Care Team: Lorre Munroe, NP as PCP - General (Internal Medicine) Ronney Asters Jackelyn Poling, RPH-CPP as Pharmacist Bridgett Larsson, LCSW as Social Worker (Licensed Clinical Social Worker)  Indicate any recent Medical Services you may have received from other than [facility] providers in the past year (date may be approximate).     Assessment:   This is a routine wellness  examination for [place].  Hearing/Vision screen Hearing Screening - Comments:: No aids Vision Screening - Comments:: Wears readers- [facility]  Dietary issues and exercise activities discussed:     Goals Addressed             This Visit's Progress    DIET - EAT MORE FRUITS AND VEGETABLES         Depression Screen    08/28/2022    3:02 PM 06/04/2022    8:31 AM 04/24/2022   12:05 PM 04/04/2022   10:43 AM 01/21/2022    3:10 PM 07/29/2021    8:05 AM 05/29/2021    8:17 AM  PHQ 2/9 Scores  PHQ - 2 Score 0 0 0 0 0 1 1  PHQ- 9 Score 0 0    4 2    Fall Risk    08/28/2022    3:05 PM 06/04/2022    8:32 AM 04/24/2022   12:06 PM 04/04/2022   10:43 AM 07/29/2021    8:04 AM  Fall Risk   Falls in the past year? 0 0 0 0 0  Number falls in past yr: 0 0   0  Injury with Fall? 0 1 0 0 0  Risk for fall due to : No Fall Risks No Fall Risks No Fall Risks No Fall Risks No Fall Risks  Follow up Falls prevention discussed;Falls evaluation completed Falls evaluation completed   Falls evaluation completed    MEDICARE RISK AT HOME:  Medicare Risk at Home - 08/28/22 1505     Any stairs in or around the home? Yes    If so, are there any without handrails? No    Home free of loose throw rugs in walkways, pet beds, electrical cords, etc? Yes    Adequate lighting in your home to reduce risk of falls? Yes    Life alert? No    Use of a cane, walker or w/c? No    Grab bars in the bathroom? Yes    Shower chair or bench in shower? Yes    Elevated toilet seat  or a handicapped toilet? No             TIMED UP AND GO:  Was the test performed?  No    Cognitive Function:        08/28/2022    3:06 PM 07/29/2021    8:07 AM 05/08/2020    2:09 PM 09/16/2018   11:53 AM  6CIT Screen  What Year? 0 points 0 points 0 points 0 points  What month? 0 points 0 points 0 points 0 points  What time? 0 points 0 points 0 points 0 points  Count back from 20 0 points 0 points 0 points 0 points  Months in reverse  0 points 0 points 0 points 0 points  Repeat phrase 0 points 0 points 4 points 2 points  Total Score 0 points 0 points 4 points 2 points    Immunizations Immunization History  Administered Date(s) Administered   PFIZER(Purple Top)SARS-COV-2 Vaccination 04/21/2019, 05/12/2019, 12/17/2019   Pneumococcal Conjugate-13 11/18/2017   Pneumococcal Polysaccharide-23 09/20/2008, 10/17/2020   Tdap 04/23/2007, 07/16/2017   Zoster Recombinant(Shingrix) 10/17/2020   Zoster, Live 11/12/2015    TDAP status: Up to date  Flu Vaccine status: Declined, Education has been provided regarding the importance of this vaccine but patient still declined. Advised may receive this vaccine at local pharmacy or Health Dept. Aware to provide a copy of the vaccination record if obtained from local pharmacy or Health Dept. Verbalized acceptance and understanding.  Pneumococcal vaccine status: Up to date  Covid-19 vaccine status: Completed vaccines  Qualifies for Shingles Vaccine? Yes   Zostavax completed Yes   Shingrix Completed?: Yes  Screening Tests Health Maintenance  Topic Date Due   Zoster Vaccines- Shingrix (2 of 2) 12/12/2020   COVID-19 Vaccine (4 - 2023-24 season) 10/18/2021   OPHTHALMOLOGY EXAM  09/24/2022   Diabetic kidney evaluation - Urine ACR  12/03/2022   HEMOGLOBIN A1C  12/04/2022   Colonoscopy  03/06/2023   Diabetic kidney evaluation - eGFR measurement  06/04/2023   FOOT EXAM  06/04/2023   Medicare Annual Wellness (AWV)  08/28/2023   MAMMOGRAM  08/31/2023   DTaP/Tdap/Td (3 - Td or Tdap) 07/17/2027   Pneumonia Vaccine 42+ Years old  Completed   DEXA SCAN  Completed   Hepatitis C Screening  Completed   HPV VACCINES  Aged Out    Health Maintenance  Health Maintenance Due  Topic Date Due   Zoster Vaccines- Shingrix (2 of 2) 12/12/2020   COVID-19 Vaccine (4 - 2023-24 season) 10/18/2021    Colorectal cancer screening: Type of screening: Colonoscopy. Completed 03/05/18. Repeat every 5  years  Mammogram status: Ordered SCHEDULED 09/08/22. Pt provided with contact info and advised to call to schedule appt.   Bone Density status: Ordered SCHEDULED FOR 09/08/22. Pt provided with contact info and advised to call to schedule appt.  Lung Cancer Screening: (Low Dose CT Chest recommended if Age 67-80 years, 20 pack-year currently smoking OR have quit w/in 15years.) does not qualify.   Additional Screening:  Hepatitis C Screening: does qualify; Completed 03/03/18  Vision Screening: Recommended annual ophthalmology exams for early detection of glaucoma and other disorders of the eye. Is the patient up to date with their annual eye exam?  Yes  Who is the provider or what is the name of the office in which the patient attends annual eye exams? [facility] If pt is not established with a provider, would they like to be referred to a provider to establish care? No .  Dental Screening: Recommended annual dental exams for proper oral hygiene  Diabetic Foot Exam: Diabetic Foot Exam: Completed 06/04/22  Community Resource Referral / Chronic Care Management: CRR required this visit?  No   CCM required this visit?  No     Plan:     I have personally reviewed and noted the following in the patient's chart:   Medical and social history Use of alcohol, tobacco or illicit drugs  Current medications and supplements including opioid prescriptions. Patient is not currently taking opioid prescriptions. Functional ability and status Nutritional status Physical activity Advanced directives List of other physicians Hospitalizations, surgeries, and ER visits in previous 12 months Vitals Screenings to include cognitive, depression, and falls Referrals and appointments  In addition, I have reviewed and discussed with patient certain preventive protocols, quality metrics, and best practice recommendations. A written personalized care plan for preventive services as well as general  preventive health recommendations were provided to patient.     Hal Hope, LPN   4/54/[id number]   After Visit Summary: (MyChart) Due to this being a telephonic visit, the after visit summary with patients personalized plan was offered to patient via MyChart   Nurse Notes: none

## 2022-08-28 NOTE — Patient Instructions (Signed)
Summer Young , Thank you for taking time to come for your Medicare Wellness Visit. I appreciate your ongoing commitment to your health goals. Please review the following plan we discussed and let me know if I can assist you in the future.   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 This corresponds with fasting sugars less than 130 and 2 hour after meal sugars less than 180. Please check your blood sugars and keep log of results  Our goal bad cholesterol, or LDL, is less than 70 . This is why it is important to continue taking your atorvastatin  Check your blood pressure periodically, and any time you have concerning symptoms like headache, chest pain, dizziness, shortness of breath, or vision changes.   Our goal is less than 130/80.  To appropriately check your blood pressure, make sure you do the following:  1) Avoid caffeine, exercise, or tobacco products for 30 minutes before checking. Empty your bladder. 2) Sit with your back supported in a flat-backed chair. Rest your arm on something flat (arm of the chair, table, etc). 3) Sit still with your feet flat on the floor, resting, for at least 5 minutes.  4) Check your blood pressure. Take 1-2  readings.  5) Write down these readings and bring with you to any provider appointments.  Bring your home blood pressure machine with you to a provider's office for accuracy comparison at least once a year.   Make sure you take your blood pressure medications before you come to any office visit, even if you were asked to fast for labs.  Feel free to call me with any questions or concerns. I look forward to our next call!  Estelle Grumbles, PharmD, BCACP Clinical Pharmacist [facility] [phone]      Track and Manage My Symptoms-Anxiety     Patient Self Care Activities:  Attend all scheduled provider appointments Continue utilizing healthy coping skills and strategies on communication Contact clinic with any questions or concerns     Weight (lb) < 160 lb (72.6 kg)        This is a list of the screening recommended for you and due dates:  Health Maintenance  Topic Date Due   Zoster (Shingles) Vaccine (2 of 2) 12/12/2020   COVID-19 Vaccine (4 - 2023-24 season) 10/18/2021   Eye exam for diabetics  09/24/2022   Yearly kidney health urinalysis for diabetes  12/03/2022   Hemoglobin A1C  12/04/2022   Colon Cancer Screening  03/06/2023   Yearly kidney function blood test for diabetes  06/04/2023   Complete foot exam   06/04/2023   Medicare Annual Wellness Visit  08/28/2023   Mammogram  08/31/2023  DTaP/Tdap/Td vaccine (3 - Td or Tdap) 07/17/2027   Pneumonia Vaccine  Completed   DEXA scan (bone density measurement)  Completed   Hepatitis C Screening  Completed   HPV Vaccine  Aged Out    Advanced directives: no  Conditions/risks identified: none  Next appointment: Follow up in one year for your annual wellness visit 09/03/23 @ 3:00 pm by phone   Preventive Care 65 Years and Older, Female Preventive care refers to lifestyle choices and visits with your health care provider that can promote health and wellness. What does preventive care  include? A yearly physical exam. This is also called an annual well check. Dental exams once or twice a year. Routine eye exams. Ask your health care provider how often you should have your eyes checked. Personal lifestyle choices, including: Daily care of your teeth and gums. Regular physical activity. Eating a healthy diet. Avoiding tobacco and drug use. Limiting alcohol use. Practicing safe sex. Taking low-dose aspirin every day. Taking vitamin and mineral supplements as recommended by your health care provider. What happens during an annual well check? The services and screenings done by your health care provider during your annual well check will depend on your age, overall health, lifestyle risk factors, and family history of disease. Counseling  Your health care provider may ask you questions about your: Alcohol use. Tobacco use. Drug use. Emotional well-being. Home and relationship well-being. Sexual activity. Eating habits. History of falls. Memory and ability to understand (cognition). Work and work Astronomer. Reproductive health. Screening  You may have the following tests or measurements: Height, weight, and BMI. Blood pressure. Lipid and cholesterol levels. These may be checked every 5 years, or more frequently if you are over 75 years old. Skin check. Lung cancer screening. You may have this screening every year starting at age 5 if you have a 30-pack-year history of smoking and currently smoke or have quit within the past 15 years. Fecal occult blood test (FOBT) of the stool. You may have this test every year starting at age 48. Flexible sigmoidoscopy or colonoscopy. You may have a sigmoidoscopy every 5 years or a colonoscopy every 10 years starting at age 15. Hepatitis C blood test. Hepatitis B blood test. Sexually transmitted disease (STD) testing. Diabetes screening. This is done by checking your blood sugar (glucose) after you have not eaten for a while  (fasting). You may have this done every 1-3 years. Bone density scan. This is done to screen for osteoporosis. You may have this done starting at age 60. Mammogram. This may be done every 1-2 years. Talk to your health care provider about how often you should have regular mammograms. Talk with your health care provider about your test results, treatment options, and if necessary, the need for more tests. Vaccines  Your health care provider may recommend certain vaccines, such as: Influenza vaccine. This is recommended every year. Tetanus, diphtheria, and acellular pertussis (Tdap, Td) vaccine. You may need a Td booster every 10 years. Zoster vaccine. You may need this after age 63. Pneumococcal 13-valent conjugate (PCV13) vaccine. One dose is recommended after age 33. Pneumococcal polysaccharide (PPSV23) vaccine. One dose is recommended after age 75. Talk to your health care provider about which screenings and vaccines you need and how often you need them. This information is not intended to replace advice given to you by your health care provider. Make sure you discuss any questions you have with your health care provider. Document Released: 03/02/2015 Document Revised: 10/24/2015 Document  Reviewed: 12/05/2014 Elsevier Interactive Patient Education  2017 [employer].  Fall Prevention in the Home Falls can cause injuries. They can happen to people of all ages. There are many things you can do to make your home safe and to help prevent falls. What can I do on the outside of my home? Regularly fix the edges of walkways and driveways and fix any cracks. Remove anything that might make you trip as you walk through a door, such as a raised step or threshold. Trim any bushes or trees on the path to your home. Use bright outdoor lighting. Clear any walking paths of anything that might make someone trip, such as rocks or tools. Regularly check to see if handrails are loose or broken. Make sure that  both sides of any steps have handrails. Any raised decks and porches should have guardrails on the edges. Have any leaves, snow, or ice cleared regularly. Use sand or salt on walking paths during winter. Clean up any spills in your garage right away. This includes oil or grease spills. What can I do in the bathroom? Use night lights. Install grab bars by the toilet and in the tub and shower. Do not use towel bars as grab bars. Use non-skid mats or decals in the tub or shower. If you need to sit down in the shower, use a plastic, non-slip stool. Keep the floor dry. Clean up any water that spills on the floor as soon as it happens. Remove soap buildup in the tub or shower regularly. Attach bath mats securely with double-sided non-slip rug tape. Do not have throw rugs and other things on the floor that can make you trip. What can I do in the bedroom? Use night lights. Make sure that you have a light by your bed that is easy to reach. Do not use any sheets or blankets that are too big for your bed. They should not hang down onto the floor. Have a firm chair that has side arms. You can use this for support while you get dressed. Do not have throw rugs and other things on the floor that can make you trip. What can I do in the kitchen? Clean up any spills right away. Avoid walking on wet floors. Keep items that you use a lot in easy-to-reach places. If you need to reach something above you, use a strong step stool that has a grab bar. Keep electrical cords out of the way. Do not use floor polish or wax that makes floors slippery. If you must use wax, use non-skid floor wax. Do not have throw rugs and other things on the floor that can make you trip. What can I do with my stairs? Do not leave any items on the stairs. Make sure that there are handrails on both sides of the stairs and use them. Fix handrails that are broken or loose. Make sure that handrails are as long as the stairways. Check  any carpeting to make sure that it is firmly attached to the stairs. Fix any carpet that is loose or worn. Avoid having throw rugs at the top or bottom of the stairs. If you do have throw rugs, attach them to the floor with carpet tape. Make sure that you have a light switch at the top of the stairs and the bottom of the stairs. If you do not have them, ask someone to add them for you. What else can I do to help prevent falls? Wear shoes that: Do  not have high heels. Have rubber bottoms. Are comfortable and fit you well. Are closed at the toe. Do not wear sandals. If you use a stepladder: Make sure that it is fully opened. Do not climb a closed stepladder. Make sure that both sides of the stepladder are locked into place. Ask someone to hold it for you, if possible. Clearly mark and make sure that you can see: Any grab bars or handrails. First and last steps. Where the edge of each step is. Use tools that help you move around (mobility aids) if they are needed. These include: Canes. Walkers. Scooters. Crutches. Turn on the lights when you go into a dark area. Replace any light bulbs as soon as they burn out. Set up your furniture so you have a clear path. Avoid moving your furniture around. If any of your floors are uneven, fix them. If there are any pets around you, be aware of where they are. Review your medicines with your doctor. Some medicines can make you feel dizzy. This can increase your chance of falling. Ask your doctor what other things that you can do to help prevent falls. This information is not intended to replace advice given to you by your health care provider. Make sure you discuss any questions you have with your health care provider. Document Released: 11/30/2008 Document Revised: 07/12/2015 Document Reviewed: 03/10/2014 Elsevier Interactive Patient Education  2017 [employer].

## 2022-09-08 ENCOUNTER — Ambulatory Visit
Admission: RE | Admit: 2022-09-08 | Discharge: 2022-09-08 | Disposition: A | Discharge status: Home / Self Care | Payer: Medicare HMO | Source: Ambulatory Visit | Attending: Internal Medicine | Admitting: Internal Medicine

## 2022-09-08 ENCOUNTER — Ambulatory Visit: Payer: PPO | Admitting: Pharmacist

## 2022-09-08 DIAGNOSIS — Z780 Asymptomatic menopausal state: Secondary | ICD-10-CM

## 2022-09-08 DIAGNOSIS — Z1231 Encounter for screening mammogram for malignant neoplasm of breast: Secondary | ICD-10-CM | POA: Diagnosis not present

## 2022-09-08 DIAGNOSIS — E1165 Type 2 diabetes mellitus with hyperglycemia: Secondary | ICD-10-CM

## 2022-09-08 DIAGNOSIS — M85851 Other specified disorders of bone density and structure, right thigh: Secondary | ICD-10-CM | POA: Diagnosis not present

## 2022-09-08 NOTE — Progress Notes (Signed)
09/08/2022 Name: Summer Young MRN: [id number] DOB: [birth year]  Chief Complaint  Patient presents with   Medication Management    Summer Young is a 72 y.o. year old female who presented for a telephone visit.   They were referred to the pharmacist by their PCP for assistance in managing diabetes, hypertension, hyperlipidemia, and medication access.    Subjective:   Care Team: Primary Care Provider: Lorre Munroe, NP ; Next Scheduled Visit: 12/04/2022  Medication Access/Adherence  Current Pharmacy:  [facility] [address], [place] - [address] [place] [address] Phone: [phone] Fax: [phone]   Patient reports affordability concerns with their medications: No  Patient reports access/transportation concerns to their pharmacy: No  Patient reports adherence concerns with their medications:  No       Type 2 Diabetes:   Current medications:  Tresiba 18 units once daily Metformin ER 500 mg - 3 tablets daily with breakfast Ozempic 1 mg once weekly   Patient using Freestyle Libre 3 continuous blood glucose monitor (CGM) Using her iPhone in place of reader devices Review results from Freestyle LibreView:     Patient reports feeling shaky with a reading of 69 (checked with glucometer) this morning. Reports her Freestyle CGM did not provide an alert Attributes low reading to not eating well over past month related to stress of trying to sell her home From review of patient's information from [place], note patient currently has her Low Glucose alarm off Note patient carries glucose tablets with her    Current physical activity: patient previously active throughout the day at work, but reports recently less active due to back pain. Reports doing some exercise videos at home   Statin: atorvastatin 40 mg daily     Current medication access support: Enrolled in [place] and Ozempic medication assistance from [employer] for 2024 calendar  year    Objective:  Lab Results  Component Value Date   HGBA1C 7.2 (H) 06/04/2022    Lab Results  Component Value Date   CREATININE 0.67 06/04/2022   BUN 15 06/04/2022   NA 140 06/04/2022   K 4.4 06/04/2022   CL 108 06/04/2022   CO2 24 06/04/2022    Lab Results  Component Value Date   CHOL 106 06/04/2022   HDL 53 06/04/2022   LDLCALC 40 06/04/2022   TRIG 44 06/04/2022   CHOLHDL 2.0 06/04/2022   BP Readings from Last 3 Encounters:  06/04/22 122/80  04/24/22 116/68  04/04/22 (!) 108/58   Pulse Readings from Last 3 Encounters:  06/04/22 86  04/24/22 76  04/04/22 83    Medications Reviewed Today     Reviewed by Summer Young, RPH-CPP (Pharmacist) on 09/08/22 at [phone]  Med List Status: <None>   Medication Order Taking? Sig Documenting Provider Last Dose Status Informant  acetaminophen (TYLENOL) 650 MG CR tablet [id number]  Take 650 mg by mouth 2 (two) times daily as needed for pain. [provider]  Active   alendronate (FOSAMAX) 70 MG tablet [id number]  TAKE 1 TABLET BY MOUTH ONCE A WEEK WITH  A  FULL  GLASS  OF  WATER  ON  AN  EMPTY  STOMACH Young, Summer Oxford, NP  Active   atorvastatin (LIPITOR) 40 MG tablet [id number]  Take 1 tablet by mouth once daily Summer Munroe, NP  Active   blood glucose meter kit and supplies [id number]  Dispense based on patient and insurance preference. Use up to four times daily  as directed. (FOR ICD-10 E10.9, E11.9). Summer Fuller, FNP  Active   Blood Glucose Monitoring Suppl [facility] VERIO) w/Device KIT [id number]  Use to check blood sugar as directed.  DX: E11.65 Summer Munroe, NP  Active   cetirizine (ZYRTEC) 10 MG tablet [id number]  Take 1 tablet (10 mg total) by mouth daily.  Patient not taking: Reported on 06/04/2022   Summer Munroe, NP  Active   Cholecalciferol (VITAMIN D3) 50 MCG (2000 UT) capsule [id number]  Take 1,000 Units by mouth daily.  [provider]  Active   Continuous Glucose Sensor (FREESTYLE  LIBRE 3 SENSOR) [place] [id number]  Place 1 sensor on the skin every 14 days DX: E11.65 Summer Munroe, NP  Active   Diclofenac Sodium 3 % GEL [id number]  Apply 2-3 gm topically, 4 times daily. [provider]  Active   EPINEPHrine 0.3 mg/0.3 mL IJ SOAJ injection [id number]  Inject 0.3 mg into the muscle as needed for anaphylaxis. Summer Munroe, NP  Active   fluticasone [facility]) 50 MCG/ACT nasal spray [id number]  Place 2 sprays into both nostrils daily. Use for 4-6 weeks then stop and use seasonally or as needed.  Patient not taking: Reported on 08/28/2022   Summer Munroe, NP  Active   insulin degludec (TRESIBA FLEXTOUCH) 100 UNIT/ML FlexTouch Pen [id number] Yes Inject 26 Units into the skin daily.  Patient taking differently: Inject 18 Units into the skin daily.   Summer Munroe, NP Taking Active   metFORMIN (GLUCOPHAGE-XR) 500 MG 24 hr tablet [id number] Yes Take 3 tablets (1,500 mg total) by mouth daily with breakfast. Summer Munroe, NP Taking Active   omeprazole (PRILOSEC) 40 MG capsule [id number]  TAKE 1 CAPSULE BY MOUTH ONCE DAILY BEFORE BREAKFAST Young, Summer Oxford, NP  Active            Med Note Summer Young Aug 28, 2022  3:01 PM) Takes PRN   Summer Young Lancets 33G MISC [id number]  Use to check blood sugar as directed.  DX: E11.65 Summer Munroe, NP  Active   [facility] ULTRA test strip [id number]  Check blood sugar 1 x daily as directed Summer Cords, DO  Active   Semaglutide, 1 MG/DOSE, (OZEMPIC, 1 MG/DOSE,) 2 MG/1.5ML SOPN [id number] Yes INJECT 1 MG SUBCUTANEOUSLY ONCE A WEEK Young, Summer Oxford, NP Taking Active   vitamin B-12 (CYANOCOBALAMIN) 1000 MCG tablet [id number]  Take 1,000 mcg by mouth daily.  [provider]  Active               Assessment/Plan:   Diabetes: - Encourage patient to have regular well-balanced meals and bedtime snack, while controlling carbohydrate portion sizes Encourage patient to have a planned carbohydrate + protein  snack (such as crackers/apple + peanut butter) at bedtime - Have counseled patient on symptoms of/how to manage low blood sugar - Counsel patient on turing low glucose alarm for her Freestyle Libre 3 monitor back on - Patient to continue to use CGM as feedback on dietary choices/habits to aid with glucose control. Patient to follow up with office if needed for readings outside of established parameters or symptoms, particularly if has any further symptoms of hypoglycemia   Follow Up Plan: Clinic Pharmacist will follow up with patient by telephone on 12/08/2022 at 8:30 am   Estelle Grumbles, PharmD, [facility] Clinical Pharmacist [facility] [phone]

## 2022-09-08 NOTE — Patient Instructions (Signed)
Goals Addressed             This Visit's Progress    Pharmacy Goals       Our goal A1c is less than 7%. This corresponds with fasting sugars less than 130 and 2 hour after meal sugars less than 180. Please check your blood sugars and keep log of results  Our goal bad cholesterol, or LDL, is less than 70 . This is why it is important to continue taking your atorvastatin  Check your blood pressure periodically, and any time you have concerning symptoms like headache, chest pain, dizziness, shortness of breath, or vision changes.   Our goal is less than 130/80.  To appropriately check your blood pressure, make sure you do the following:  1) Avoid caffeine, exercise, or tobacco products for 30 minutes before checking. Empty your bladder. 2) Sit with your back supported in a flat-backed chair. Rest your arm on something flat (arm of the chair, table, etc). 3) Sit still with your feet flat on the floor, resting, for at least 5 minutes.  4) Check your blood pressure. Take 1-2 readings.  5) Write down these readings and bring with you to any provider appointments.  Bring your home blood pressure machine with you to a provider's office for accuracy comparison at least once a year.   Make sure you take your blood pressure medications before you come to any office visit, even if you were asked to fast for labs.  Feel free to call me with any questions or concerns. I look forward to our next call!  Estelle Grumbles, PharmD, [facility] Clinical Pharmacist [facility] [phone]

## 2022-09-25 DIAGNOSIS — E119 Type 2 diabetes mellitus without complications: Secondary | ICD-10-CM | POA: Diagnosis not present

## 2022-09-25 LAB — HM DIABETES EYE EXAM

## 2022-09-30 ENCOUNTER — Telehealth: Payer: Self-pay

## 2022-09-30 NOTE — Telephone Encounter (Signed)
Mychart message sent to patient making her aware that tresiba and ozempic samples are ready for pick up.  Attempted to call patient but got disconnected.

## 2022-10-03 ENCOUNTER — Encounter: Payer: Self-pay | Admitting: Internal Medicine

## 2022-10-03 ENCOUNTER — Ambulatory Visit (INDEPENDENT_AMBULATORY_CARE_PROVIDER_SITE_OTHER): Payer: Medicare HMO | Admitting: Internal Medicine

## 2022-10-03 VITALS — BP 114/62 | HR 90 | Temp 96.6°F | Wt 158.0 lb

## 2022-10-03 DIAGNOSIS — G8929 Other chronic pain: Secondary | ICD-10-CM

## 2022-10-03 DIAGNOSIS — B351 Tinea unguium: Secondary | ICD-10-CM

## 2022-10-03 DIAGNOSIS — M545 Low back pain, unspecified: Secondary | ICD-10-CM

## 2022-10-03 MED ORDER — EPINEPHRINE 0.3 MG/0.3ML IJ SOAJ: 0.3000 mg | INTRAMUSCULAR | 1 refills | Status: AC | PRN

## 2022-10-03 NOTE — Patient Instructions (Signed)

## 2022-10-03 NOTE — Progress Notes (Signed)
Subjective:    Patient ID: Summer Young, female    DOB: 1951-02-10, 72 y.o.   MRN: [id number]  HPI  Patient presents to clinic today with complaint of chronic low back pain. This is a chronic issue that has worsened in the last month. She reports the pain is sore and achy but can be sharp and stabbing at times. The pain does not radiate into her legs. She denies numbness, tingling or weakness in her lower extremities. She denies any injury to the area. She has never had back surgery. She has tried Tylenol OTC with minimal relief of symptoms. Xray of the lumbar spine from 05/2021 showed:  IMPRESSION: Mild degenerative change without acute abnormality.  She reports she has failed chiropractor and PT in the past.  She also reports toenail fungus.  She has not tried anything OTC for this.  Review of Systems     Past Medical History:  Diagnosis Date   Allergy    Chronic pain of both knees    Diabetes mellitus without complication (HCC)    Hyperlipidemia    Hypertension     Current Outpatient Medications  Medication Sig Dispense Refill   acetaminophen (TYLENOL) 650 MG CR tablet Take 650 mg by mouth 2 (two) times daily as needed for pain.     alendronate (FOSAMAX) 70 MG tablet TAKE 1 TABLET BY MOUTH ONCE A WEEK WITH  A  FULL  GLASS  OF  WATER  ON  AN  EMPTY  STOMACH 12 tablet 0   atorvastatin (LIPITOR) 40 MG tablet Take 1 tablet by mouth once daily 90 tablet 3   blood glucose meter kit and supplies Dispense based on patient and insurance preference. Use up to four times daily as directed. (FOR ICD-10 E10.9, E11.9). 1 each 0   Blood Glucose Monitoring Suppl (ONETOUCH VERIO) w/Device KIT Use to check blood sugar as directed.  DX: E11.65 1 kit 0   cetirizine (ZYRTEC) 10 MG tablet Take 1 tablet (10 mg total) by mouth daily. (Patient not taking: Reported on 06/04/2022) 30 tablet 0   Cholecalciferol (VITAMIN D3) 50 MCG (2000 UT) capsule Take 1,000 Units by mouth daily.      Continuous Glucose  Sensor (FREESTYLE LIBRE 3 SENSOR) MISC Place 1 sensor on the skin every 14 days DX: E11.65 6 each 1   Diclofenac Sodium 3 % GEL Apply 2-3 gm topically, 4 times daily.     EPINEPHrine 0.3 mg/0.3 mL IJ SOAJ injection Inject 0.3 mg into the muscle as needed for anaphylaxis. 1 each 1   fluticasone (FLONASE) 50 MCG/ACT nasal spray Place 2 sprays into both nostrils daily. Use for 4-6 weeks then stop and use seasonally or as needed. (Patient not taking: Reported on 08/28/2022) 16 g 0   insulin degludec (TRESIBA FLEXTOUCH) 100 UNIT/ML FlexTouch Pen Inject 26 Units into the skin daily. (Patient taking differently: Inject 18 Units into the skin daily.) 15 mL 0   metFORMIN (GLUCOPHAGE-XR) 500 MG 24 hr tablet Take 3 tablets (1,500 mg total) by mouth daily with breakfast. 270 tablet 1   omeprazole (PRILOSEC) 40 MG capsule TAKE 1 CAPSULE BY MOUTH ONCE DAILY BEFORE BREAKFAST 90 capsule 0   OneTouch Delica Lancets 33G MISC Use to check blood sugar as directed.  DX: E11.65 100 each 1   ONETOUCH ULTRA test strip Check blood sugar 1 x daily as directed 100 each 12   Semaglutide, 1 MG/DOSE, (OZEMPIC, 1 MG/DOSE,) 2 MG/1.5ML SOPN INJECT 1 MG SUBCUTANEOUSLY ONCE A  WEEK 4 mL 0   vitamin B-12 (CYANOCOBALAMIN) 1000 MCG tablet Take 1,000 mcg by mouth daily.      No current facility-administered medications for this visit.    Allergies  Allergen Reactions   Aspirin Hives    Other reaction(s): Other (See Comments) Other Reaction: Not Assessed    Insulin Detemir Hives and Rash    Errythema, edema, heat at site of injection did not improve after 2 weeks of use.     Shellfish Allergy Anaphylaxis   Dapagliflozin Other (See Comments)    Yeast infections  Yeast infection  Farxiga   Influenza Vaccines Other (See Comments)    Forxiga   Other     Other reaction(s): Other (See Comments) Uncoded Allergy. Allergen: Shellfish, Other Reaction: Not Assessed    Flu vaccine given 1998 caused anaphylaxis    Family History   Problem Relation Age of Onset   Diabetes Mother    Diabetes Father    Stroke Father    Diabetes Sister    Heart attack Other    Diabetes Sister    Diabetes Sister    Diabetes Sister     Social History   Socioeconomic History   Marital status: Married    Spouse name: Reynoso,Eduardo   Number of children: Not on file   Years of education: Not on file   Highest education level: High school graduate  Occupational History   Occupation: housekeeper    Comment: self-employed   Occupation: Self- Employed  Tobacco Use   Smoking status: Never   Smokeless tobacco: Never  Vaping Use   Vaping status: Never Used  Substance and Sexual Activity   Alcohol use: Yes    Alcohol/week: 0.0 - 1.0 standard drinks of alcohol    Comment: occasional   Drug use: Never   Sexual activity: Yes    Birth control/protection: None  Other Topics Concern   Not on file  Social History Narrative   Not on file   Social Determinants of Health   Financial Resource Strain: Medium Risk (08/28/2022)   Overall Financial Resource Strain (CARDIA)    Difficulty of Paying Living Expenses: Somewhat hard  Food Insecurity: No Food Insecurity (08/28/2022)   Hunger Vital Sign    Worried About Running Out of Food in the Last Year: Never true    Ran Out of Food in the Last Year: Never true  Transportation Needs: No Transportation Needs (08/28/2022)   PRAPARE - Administrator, Civil Service (Medical): No    Lack of Transportation (Non-Medical): No  Physical Activity: Inactive (08/28/2022)   Exercise Vital Sign    Days of Exercise per Week: 0 days    Minutes of Exercise per Session: 0 min  Stress: Stress Concern Present (08/28/2022)   [employer] of Occupational Health - Occupational Stress Questionnaire    Feeling of Stress : Rather much  Social Connections: Moderately Isolated (08/28/2022)   Social Connection and Isolation Panel [NHANES]    Frequency of Communication with Friends and Family: More  than three times a week    Frequency of Social Gatherings with Friends and Family: Not on file    Attends Religious Services: More than 4 times per year    Active Member of [employer] or Organizations: No    Attends Banker Meetings: Never    Marital Status: Separated  Intimate Partner Violence: Not At Risk (08/28/2022)   Humiliation, Afraid, Rape, and Kick questionnaire    Fear of Current or Ex-Partner: No  Emotionally Abused: No    Physically Abused: No    Sexually Abused: No     Constitutional: Denies fever, malaise, fatigue, headache or abrupt weight changes.  HEENT: Denies eye pain, eye redness, ear pain, ringing in the ears, wax buildup, runny nose, nasal congestion, bloody nose, or sore throat. Respiratory: Denies difficulty breathing, shortness of breath, cough or sputum production.   Cardiovascular: Denies chest pain, chest tightness, palpitations or swelling in the hands or feet.  Gastrointestinal: Denies abdominal pain, bloating, constipation, diarrhea or blood in the stool.  GU: Denies urgency, frequency, pain with urination, burning sensation, blood in urine, odor or discharge. Musculoskeletal: Patient reports chronic back and joint pain.  Denies decrease in range of motion, difficulty with gait, muscle pain or joint swelling.  Skin: Patient reports toenail fungus.  Denies redness, rashes, lesions or ulcercations.  Neurological: Denies dizziness, difficulty with memory, difficulty with speech or problems with balance and coordination.  Psych: Denies anxiety, depression, SI/HI.  No other specific complaints in a complete review of systems (except as listed in HPI above).  Objective:   Physical Exam BP 114/62 (BP Location: Left Arm, Patient Position: Sitting, Cuff Size: Normal)   Pulse 90   Temp (!) 96.6 F (35.9 C) (Temporal)   Wt 158 lb (71.7 kg)   SpO2 98%   BMI 28.90 kg/m   Wt Readings from Last 3 Encounters:  08/28/22 152 lb (68.9 kg)  06/04/22 152 lb  12.8 oz (69.3 kg)  04/24/22 153 lb (69.4 kg)    General: Appears her stated age, overweight, in NAD. Skin: Toenail fungus noted of bilateral feet. HEENT: Head: normal shape and size; Eyes: sclera white, no icterus, conjunctiva pink, PERRLA and EOMs intact;   Cardiovascular: Normal rate and rhythm. S1,S2 noted.  No murmur, rubs or gallops noted.  Pulmonary/Chest: Normal effort and positive vesicular breath sounds. No respiratory distress. No wheezes, rales or ronchi noted.  Musculoskeletal: Decreased flexion and extension of the lumbar spine.  Normal rotation and lateral bending.  Bony tenderness noted over the lumbar spine.  Strength 5/5 BLE.  No difficulty with gait.  Neurological: Alert and oriented.     BMET    Component Value Date/Time   NA 140 06/04/2022 0847   K 4.4 06/04/2022 0847   CL 108 06/04/2022 0847   CO2 24 06/04/2022 0847   GLUCOSE 100 (H) 06/04/2022 0847   BUN 15 06/04/2022 0847   CREATININE 0.67 06/04/2022 0847   CALCIUM 9.0 06/04/2022 0847   GFRNONAA 79 06/10/2018 0824   GFRAA 91 06/10/2018 0824    Lipid Panel     Component Value Date/Time   CHOL 106 06/04/2022 0847   TRIG 44 06/04/2022 0847   HDL 53 06/04/2022 0847   CHOLHDL 2.0 06/04/2022 0847   LDLCALC 40 06/04/2022 0847    CBC    Component Value Date/Time   WBC 8.6 06/04/2022 0847   RBC 4.69 06/04/2022 0847   HGB 12.6 06/04/2022 0847   HCT 39.5 06/04/2022 0847   PLT 341 06/04/2022 0847   MCV 84.2 06/04/2022 0847   MCH 26.9 (L) 06/04/2022 0847   MCHC 31.9 (L) 06/04/2022 0847   RDW 13.5 06/04/2022 0847   LYMPHSABS 2,967 04/12/2019 0818   EOSABS 95 04/12/2019 0818   BASOSABS 34 04/12/2019 0818    Hgb A1C Lab Results  Component Value Date   HGBA1C 7.2 (H) 06/04/2022            Assessment & Plan:  Toenail fungus:  Discussed use of topical medications daily for the next year versus oral terbinafine.  She has some reservations about the black box warning of liver failure.  She  would like to consider her options and get back with me Also discussed referral to podiatry to discuss toenail removal however she would like to hold off at this time   RTC in 2 months for follow-up of chronic conditions Nicki Reaper, NP

## 2022-10-03 NOTE — Assessment & Plan Note (Signed)
Encourage regular stretching and core strengthening Encourage weight loss as this can help reduce back pain Will obtain MRI lumbar spine at this time

## 2022-10-10 ENCOUNTER — Ambulatory Visit
Admission: RE | Admit: 2022-10-10 | Discharge: 2022-10-10 | Disposition: A | Discharge status: Home / Self Care | Payer: Medicare HMO | Source: Ambulatory Visit | Attending: Internal Medicine | Admitting: Internal Medicine

## 2022-10-10 DIAGNOSIS — M5136 Other intervertebral disc degeneration, lumbar region: Secondary | ICD-10-CM | POA: Diagnosis not present

## 2022-10-10 DIAGNOSIS — G8929 Other chronic pain: Secondary | ICD-10-CM | POA: Diagnosis not present

## 2022-10-10 DIAGNOSIS — M545 Low back pain, unspecified: Secondary | ICD-10-CM | POA: Diagnosis not present

## 2022-10-10 DIAGNOSIS — M5137 Other intervertebral disc degeneration, lumbosacral region: Secondary | ICD-10-CM | POA: Diagnosis not present

## 2022-10-10 DIAGNOSIS — M4807 Spinal stenosis, lumbosacral region: Secondary | ICD-10-CM | POA: Diagnosis not present

## 2022-10-10 DIAGNOSIS — M47816 Spondylosis without myelopathy or radiculopathy, lumbar region: Secondary | ICD-10-CM | POA: Diagnosis not present

## 2022-10-22 ENCOUNTER — Encounter: Payer: Self-pay | Admitting: Internal Medicine

## 2022-10-28 ENCOUNTER — Encounter: Payer: Self-pay | Admitting: Internal Medicine

## 2022-10-28 DIAGNOSIS — G8929 Other chronic pain: Secondary | ICD-10-CM

## 2022-11-10 NOTE — Progress Notes (Unsigned)
Referring Physician:  Lorre Munroe, NP [address] [place],  [place] [address]  Primary Physician:  Lorre Munroe, NP  History of Present Illness: 11/10/2022 [name] is here today with a chief complaint of ***  Chronic low back pain  Bilateral hip pain   Duration: ***2 years + Location: *** Quality: ***07/10 Severity: ***  Precipitating: aggravated by *** Modifying factors: made better by *** Weakness: none Timing: *** Bowel/Bladder Dysfunction: none  Conservative measures:  Physical therapy: ***  Multimodal medical therapy including regular antiinflammatories: ***  Injections: *** epidural steroid injections  Past Surgery: ***  Summer Young has ***no symptoms of cervical myelopathy.  The symptoms are causing a significant impact on the patient's life.   Review of Systems:  A 10 point review of systems is negative, except for the pertinent positives and negatives detailed in the HPI.  Past Medical History: Past Medical History:  Diagnosis Date   Allergy    Chronic pain of both knees    Diabetes mellitus without complication (HCC)    Hyperlipidemia    Hypertension     Past Surgical History: Past Surgical History:  Procedure Laterality Date   ANKLE SURGERY Left 2009   DILATION AND CURETTAGE, DIAGNOSTIC / THERAPEUTIC  1998   miscarriage   KNEE SURGERY Left 1994   ACL    Allergies: Allergies as of 11/11/2022 - Review Complete 10/03/2022  Allergen Reaction Noted   Aspirin Hives 01/19/2013   Insulin detemir Hives and Rash 01/19/2013   Shellfish allergy Anaphylaxis 01/19/2013   Dapagliflozin Other (See Comments) 09/30/2013   Influenza vaccines Other (See Comments) 06/04/2018   Other  12/01/2017    Medications: Outpatient Encounter Medications as of 11/11/2022  Medication Sig   acetaminophen (TYLENOL) 650 MG CR tablet Take 650 mg by mouth 2 (two) times daily as needed for pain.   alendronate (FOSAMAX) 70 MG tablet TAKE 1 TABLET BY MOUTH ONCE  A WEEK WITH  A  FULL  GLASS  OF  WATER  ON  AN  EMPTY  STOMACH   atorvastatin (LIPITOR) 40 MG tablet Take 1 tablet by mouth once daily   blood glucose meter kit and supplies Dispense based on patient and insurance preference. Use up to four times daily as directed. (FOR ICD-10 E10.9, E11.9).   Blood Glucose Monitoring Suppl (ONETOUCH VERIO) w/Device KIT Use to check blood sugar as directed.  DX: E11.65   cetirizine (ZYRTEC) 10 MG tablet Take 1 tablet (10 mg total) by mouth daily.   Cholecalciferol (VITAMIN D3) 50 MCG (2000 UT) capsule Take 1,000 Units by mouth daily.    Continuous Glucose Sensor (FREESTYLE LIBRE 3 SENSOR) MISC Place 1 sensor on the skin every 14 days DX: E11.65   Diclofenac Sodium 3 % GEL Apply 2-3 gm topically, 4 times daily.   EPINEPHrine 0.3 mg/0.3 mL IJ SOAJ injection Inject 0.3 mg into the muscle as needed for anaphylaxis.   fluticasone (FLONASE) 50 MCG/ACT nasal spray Place 2 sprays into both nostrils daily. Use for 4-6 weeks then stop and use seasonally or as needed.   insulin degludec (TRESIBA FLEXTOUCH) 100 UNIT/ML FlexTouch Pen Inject 26 Units into the skin daily. (Patient taking differently: Inject 18 Units into the skin daily.)   metFORMIN (GLUCOPHAGE-XR) 500 MG 24 hr tablet Take 3 tablets (1,500 mg total) by mouth daily with breakfast.   omeprazole (PRILOSEC) 40 MG capsule TAKE 1 CAPSULE BY MOUTH ONCE DAILY BEFORE BREAKFAST   OneTouch Delica Lancets 33G MISC  Use to check blood sugar as directed.  DX: E11.65   ONETOUCH ULTRA test strip Check blood sugar 1 x daily as directed   Semaglutide, 1 MG/DOSE, (OZEMPIC, 1 MG/DOSE,) 2 MG/1.5ML SOPN INJECT 1 MG SUBCUTANEOUSLY ONCE A WEEK   vitamin B-12 (CYANOCOBALAMIN) 1000 MCG tablet Take 1,000 mcg by mouth daily.    No facility-administered encounter medications on file as of 11/11/2022.    Social History: Social History   Tobacco Use   Smoking status: Never   Smokeless tobacco: Never  Vaping Use   Vaping status: Never  Used  Substance Use Topics   Alcohol use: Yes    Alcohol/week: 0.0 - 1.0 standard drinks of alcohol    Comment: occasional   Drug use: Never    Family Medical History: Family History  Problem Relation Age of Onset   Diabetes Mother    Diabetes Father    Stroke Father    Diabetes Sister    Heart attack Other    Diabetes Sister    Diabetes Sister    Diabetes Sister     Physical Examination: @VITALWITHPAIN @  General: Patient is well developed, well nourished, calm, collected, and in no apparent distress. Attention to examination is appropriate.  Psychiatric: Patient is non-anxious.  Head:  Pupils equal, round, and reactive to light.  ENT:  Oral mucosa appears well hydrated.  Neck:   Supple.  ***Full range of motion.  Respiratory: Patient is breathing without any difficulty.  Extremities: No edema.  Vascular: Palpable dorsal pedal pulses.  Skin:   On exposed skin, there are no abnormal skin lesions.  NEUROLOGICAL:     Awake, alert, oriented to person, place, and time.  Speech is clear and fluent. Fund of knowledge is appropriate.   Cranial Nerves: Pupils equal round and reactive to light.  Facial tone is symmetric.  Facial sensation is symmetric.  ROM of spine: ***full.  Palpation of spine: ***non tender.    Strength: Side Biceps Triceps Deltoid Interossei Grip Wrist Ext. Wrist Flex.  R 5 5 5 5 5 5 5   L 5 5 5 5 5 5 5    Side Iliopsoas Quads Hamstring PF DF EHL  R 5 5 5 5 5 5   L 5 5 5 5 5 5    Reflexes are ***2+ and symmetric at the biceps, triceps, brachioradialis, patella and achilles.   Hoffman's is absent.  Clonus is not present.  Toes are down-going.  Bilateral upper and lower extremity sensation is intact to light touch.    Gait is normal.   No difficulty with tandem gait.   No evidence of dysmetria noted.  Medical Decision Making  Imaging: ***  I have personally reviewed the images and agree with the above interpretation.  Assessment and  Plan: [name] is a pleasant 72 y.o. female with ***    Thank you for involving me in the care of this patient.   I spent a total of *** minutes in both face-to-face and non-face-to-face activities for this visit on the date of this encounter.   [facility]

## 2022-11-11 ENCOUNTER — Encounter: Payer: Self-pay | Admitting: Neurosurgery

## 2022-11-11 ENCOUNTER — Ambulatory Visit: Payer: Medicare HMO | Admitting: Neurosurgery

## 2022-11-11 VITALS — BP 132/78 | Ht 61.0 in | Wt 158.0 lb

## 2022-11-11 DIAGNOSIS — M47816 Spondylosis without myelopathy or radiculopathy, lumbar region: Secondary | ICD-10-CM

## 2022-11-21 ENCOUNTER — Telehealth: Payer: Self-pay | Admitting: Neurosurgery

## 2022-11-21 DIAGNOSIS — M47816 Spondylosis without myelopathy or radiculopathy, lumbar region: Secondary | ICD-10-CM

## 2022-11-21 NOTE — Telephone Encounter (Signed)
Referral faxed and patient notified

## 2022-11-21 NOTE — Telephone Encounter (Signed)
Per Danielle's note on 11/11/22: "Patient is interested in trying injections and will be referred to [facility] clinic for consideration of Lumbar facet injections". I do not see that a referral was placed during that visit. I have placed a referral to [facility].

## 2022-11-21 NOTE — Telephone Encounter (Signed)
Patient called and would like a referral to be placed for injections at [facility].

## 2022-11-26 DIAGNOSIS — M5030 Other cervical disc degeneration, unspecified cervical region: Secondary | ICD-10-CM | POA: Diagnosis not present

## 2022-11-26 DIAGNOSIS — M19011 Primary osteoarthritis, right shoulder: Secondary | ICD-10-CM | POA: Diagnosis not present

## 2022-11-26 DIAGNOSIS — M50323 Other cervical disc degeneration at C6-C7 level: Secondary | ICD-10-CM | POA: Diagnosis not present

## 2022-11-26 DIAGNOSIS — M5412 Radiculopathy, cervical region: Secondary | ICD-10-CM | POA: Diagnosis not present

## 2022-11-28 ENCOUNTER — Other Ambulatory Visit: Payer: Self-pay | Admitting: Internal Medicine

## 2022-11-28 DIAGNOSIS — E1165 Type 2 diabetes mellitus with hyperglycemia: Secondary | ICD-10-CM

## 2022-11-28 NOTE — Telephone Encounter (Signed)
Requested Prescriptions  Pending Prescriptions Disp Refills   metFORMIN (GLUCOPHAGE-XR) 500 MG 24 hr tablet [Pharmacy Med Name: metFORMIN HCl ER 500 MG Oral Tablet Extended Release 24 Hour] 270 tablet 0    Sig: TAKE 3 TABLETS BY MOUTH ONCE DAILY WITH BREAKFAST     Endocrinology:  Diabetes - Biguanides Failed - 11/28/2022  6:44 AM      Failed - B12 Level in normal range and within 720 days    No results found for: "VITAMINB12"       Failed - CBC within normal limits and completed in the last 12 months    WBC  Date Value Ref Range Status  06/04/2022 8.6 3.8 - 10.8 Thousand/uL Final   RBC  Date Value Ref Range Status  06/04/2022 4.69 3.80 - 5.10 Million/uL Final   Hemoglobin  Date Value Ref Range Status  06/04/2022 12.6 11.7 - 15.5 g/dL Final   HCT  Date Value Ref Range Status  06/04/2022 39.5 35.0 - 45.0 % Final   MCHC  Date Value Ref Range Status  06/04/2022 31.9 (L) 32.0 - 36.0 g/dL Final   [facility]  Date Value Ref Range Status  06/04/2022 26.9 (L) 27.0 - 33.0 pg Final   MCV  Date Value Ref Range Status  06/04/2022 84.2 80.0 - 100.0 fL Final   No results found for: "PLTCOUNTKUC", "LABPLAT", "POCPLA" RDW  Date Value Ref Range Status  06/04/2022 13.5 11.0 - 15.0 % Final         Passed - Cr in normal range and within 360 days    Creat  Date Value Ref Range Status  06/04/2022 0.67 0.50 - 1.05 mg/dL Final   Creatinine, Urine  Date Value Ref Range Status  12/02/2021 34 20 - 275 mg/dL Final         Passed - HBA1C is between 0 and 7.9 and within 180 days    Hgb A1c MFr Bld  Date Value Ref Range Status  06/04/2022 7.2 (H) <5.7 % of total Hgb Final    Comment:    For someone without known diabetes, a hemoglobin A1c value of 6.5% or greater indicates that they may have  diabetes and this should be confirmed with a follow-up  test. . For someone with known diabetes, a value <7% indicates  that their diabetes is well controlled and a value  greater than or equal to  7% indicates suboptimal  control. A1c targets should be individualized based on  duration of diabetes, age, comorbid conditions, and  other considerations. . Currently, no consensus exists regarding use of hemoglobin A1c for diagnosis of diabetes for children. .          Passed - eGFR in normal range and within 360 days    GFR, Est African American  Date Value Ref Range Status  06/10/2018 91 > OR = 60 mL/min/1.47m2 Final   GFR, Est Non African American  Date Value Ref Range Status  06/10/2018 79 > OR = 60 mL/min/1.25m2 Final   eGFR  Date Value Ref Range Status  06/04/2022 95 > OR = 60 mL/min/1.35m2 Final         Passed - Valid encounter within last 6 months    Recent Outpatient Visits           1 month ago Chronic bilateral low back pain without sciatica   [facility] [place], [place] W, NP   2 months ago Type 2 diabetes mellitus with hyperglycemia, without long-term current  use of insulin [facility])   [place] [facility] Delles, Gentry Fitz A, RPH-CPP   4 months ago Type 2 diabetes mellitus with hyperglycemia, without long-term current use of insulin [facility])   [facility] Delles, Jackelyn Poling, RPH-CPP   5 months ago Encounter for general adult medical examination with abnormal findings   [place] [facility] [place], Salvadore Oxford, NP   7 months ago Urinary frequency   [place] [facility] [place], Salvadore Oxford, NP       Future Appointments             In 6 days [place], Salvadore Oxford, NP [place] [facility], [facility]

## 2022-11-29 ENCOUNTER — Other Ambulatory Visit: Payer: Self-pay | Admitting: Internal Medicine

## 2022-11-29 DIAGNOSIS — M810 Age-related osteoporosis without current pathological fracture: Secondary | ICD-10-CM

## 2022-11-29 DIAGNOSIS — E1165 Type 2 diabetes mellitus with hyperglycemia: Secondary | ICD-10-CM

## 2022-12-01 DIAGNOSIS — M542 Cervicalgia: Secondary | ICD-10-CM | POA: Diagnosis not present

## 2022-12-01 DIAGNOSIS — M25511 Pain in right shoulder: Secondary | ICD-10-CM | POA: Diagnosis not present

## 2022-12-01 NOTE — Telephone Encounter (Signed)
Requested medication (s) are due for refill today: yes   Requested medication (s) are on the active medication list: yes   Last refill:  05/20/22 #12 0 refills   Future visit scheduled: yes in 3 days   Notes to clinic:   no refills remain. Do you want to refill prior to OV in 3 days?     Requested Prescriptions  Pending Prescriptions Disp Refills   alendronate (FOSAMAX) 70 MG tablet [Pharmacy Med Name: Alendronate Sodium 70 MG Oral Tablet] 12 tablet 0    Sig: TAKE 1 TABLET BY MOUTH ONCE A WEEK WITH  A  FULL  GLASS  OF  WATER  ON  AN  EMPTY  STOMACH     Endocrinology:  Bisphosphonates Failed - 11/29/2022  6:15 PM      Failed - Vitamin D in normal range and within 360 days    No results found for: "ZH0865HQ4", "ON6295MW4", "VD125OH2TOT", "25OHVITD3", "25OHVITD2", "25OHVITD1", "VD25OH"       Failed - Mg Level in normal range and within 360 days    No results found for: "MG"       Failed - Phosphate in normal range and within 360 days    No results found for: "PHOS"       Passed - Ca in normal range and within 360 days    Calcium  Date Value Ref Range Status  06/04/2022 9.0 8.6 - 10.4 mg/dL Final         Passed - Cr in normal range and within 360 days    Creat  Date Value Ref Range Status  06/04/2022 0.67 0.50 - 1.05 mg/dL Final   Creatinine, Urine  Date Value Ref Range Status  12/02/2021 34 20 - 275 mg/dL Final         Passed - eGFR is 30 or above and within 360 days    GFR, Est African American  Date Value Ref Range Status  06/10/2018 91 > OR = 60 mL/min/1.78m2 Final   GFR, Est Non African American  Date Value Ref Range Status  06/10/2018 79 > OR = 60 mL/min/1.18m2 Final   eGFR  Date Value Ref Range Status  06/04/2022 95 > OR = 60 mL/min/1.43m2 Final         Passed - Valid encounter within last 12 months    Recent Outpatient Visits           1 month ago Chronic bilateral low back pain without sciatica   [facility] [place], Salvadore Oxford, NP   2 months ago Type 2 diabetes mellitus with hyperglycemia, without long-term current use of insulin [facility])   [place] [facility] Delles, Gentry Fitz A, RPH-CPP   5 months ago Type 2 diabetes mellitus with hyperglycemia, without long-term current use of insulin (HCC)   [place] [facility] Delles, Jackelyn Poling, RPH-CPP   6 months ago Encounter for general adult medical examination with abnormal findings   [place] [facility] [place], Salvadore Oxford, NP   7 months ago Urinary frequency   [place] [facility] [place], Salvadore Oxford, NP       Future Appointments             In 3 days Baity, Salvadore Oxford, NP  [facility]            Passed - Bone Mineral Density or Dexa Scan completed in the last 2  years      Refused Prescriptions Disp Refills   metFORMIN (GLUCOPHAGE-XR) 500 MG 24 hr tablet [Pharmacy Med Name: metFORMIN HCl ER 500 MG Oral Tablet Extended Release 24 Hour] 270 tablet 0    Sig: TAKE 3 TABLETS BY MOUTH ONCE DAILY WITH BREAKFAST     Endocrinology:  Diabetes - Biguanides Failed - 11/29/2022  6:15 PM      Failed - B12 Level in normal range and within 720 days    No results found for: "VITAMINB12"       Failed - CBC within normal limits and completed in the last 12 months    WBC  Date Value Ref Range Status  06/04/2022 8.6 3.8 - 10.8 Thousand/uL Final   RBC  Date Value Ref Range Status  06/04/2022 4.69 3.80 - 5.10 Million/uL Final   Hemoglobin  Date Value Ref Range Status  06/04/2022 12.6 11.7 - 15.5 g/dL Final   HCT  Date Value Ref Range Status  06/04/2022 39.5 35.0 - 45.0 % Final   MCHC  Date Value Ref Range Status  06/04/2022 31.9 (L) 32.0 - 36.0 g/dL Final   [facility]  Date Value Ref Range Status  06/04/2022 26.9 (L) 27.0 - 33.0 pg Final   MCV  Date Value Ref Range Status  06/04/2022 84.2 80.0 - 100.0 fL Final   No results found for: "PLTCOUNTKUC", "LABPLAT",  "POCPLA" RDW  Date Value Ref Range Status  06/04/2022 13.5 11.0 - 15.0 % Final         Passed - Cr in normal range and within 360 days    Creat  Date Value Ref Range Status  06/04/2022 0.67 0.50 - 1.05 mg/dL Final   Creatinine, Urine  Date Value Ref Range Status  12/02/2021 34 20 - 275 mg/dL Final         Passed - HBA1C is between 0 and 7.9 and within 180 days    Hgb A1c MFr Bld  Date Value Ref Range Status  06/04/2022 7.2 (H) <5.7 % of total Hgb Final    Comment:    For someone without known diabetes, a hemoglobin A1c value of 6.5% or greater indicates that they may have  diabetes and this should be confirmed with a follow-up  test. . For someone with known diabetes, a value <7% indicates  that their diabetes is well controlled and a value  greater than or equal to 7% indicates suboptimal  control. A1c targets should be individualized based on  duration of diabetes, age, comorbid conditions, and  other considerations. . Currently, no consensus exists regarding use of hemoglobin A1c for diagnosis of diabetes for children. .          Passed - eGFR in normal range and within 360 days    GFR, Est African American  Date Value Ref Range Status  06/10/2018 91 > OR = 60 mL/min/1.5m2 Final   GFR, Est Non African American  Date Value Ref Range Status  06/10/2018 79 > OR = 60 mL/min/1.25m2 Final   eGFR  Date Value Ref Range Status  06/04/2022 95 > OR = 60 mL/min/1.2m2 Final         Passed - Valid encounter within last 6 months    Recent Outpatient Visits           1 month ago Chronic bilateral low back pain without sciatica   [place] [facility] [place], [place] W, NP   2 months ago Type 2 diabetes mellitus with hyperglycemia,  without long-term current use of insulin [facility])   [place] [facility] Delles, Gentry Fitz A, RPH-CPP   5 months ago Type 2 diabetes mellitus with hyperglycemia, without long-term current use of insulin  [facility])   [place] [facility] Delles, Jackelyn Poling, RPH-CPP   6 months ago Encounter for general adult medical examination with abnormal findings   [place] [facility] [place], Salvadore Oxford, NP   7 months ago Urinary frequency   [place] [place], Salvadore Oxford, NP       Future Appointments             In 3 days Baity, Salvadore Oxford, NP [facility], [facility]

## 2022-12-01 NOTE — Telephone Encounter (Signed)
Requested by interface surescripts. Receipt confirmed by pharmacy 11/28/22 at 1059 am . Future visit in 3 days Requested Prescriptions  Pending Prescriptions Disp Refills   alendronate (FOSAMAX) 70 MG tablet [Pharmacy Med Name: Alendronate Sodium 70 MG Oral Tablet] 12 tablet 0    Sig: TAKE 1 TABLET BY MOUTH ONCE A WEEK WITH  A  FULL  GLASS  OF  WATER  ON  AN  EMPTY  STOMACH     Endocrinology:  Bisphosphonates Failed - 11/29/2022  6:15 PM      Failed - Vitamin D in normal range and within 360 days    No results found for: "NW2956OZ3", "YQ6578IO9", "VD125OH2TOT", "25OHVITD3", "25OHVITD2", "25OHVITD1", "VD25OH"       Failed - Mg Level in normal range and within 360 days    No results found for: "MG"       Failed - Phosphate in normal range and within 360 days    No results found for: "PHOS"       Passed - Ca in normal range and within 360 days    Calcium  Date Value Ref Range Status  06/04/2022 9.0 8.6 - 10.4 mg/dL Final         Passed - Cr in normal range and within 360 days    Creat  Date Value Ref Range Status  06/04/2022 0.67 0.50 - 1.05 mg/dL Final   Creatinine, Urine  Date Value Ref Range Status  12/02/2021 34 20 - 275 mg/dL Final         Passed - eGFR is 30 or above and within 360 days    GFR, Est African American  Date Value Ref Range Status  06/10/2018 91 > OR = 60 mL/min/1.67m2 Final   GFR, Est Non African American  Date Value Ref Range Status  06/10/2018 79 > OR = 60 mL/min/1.68m2 Final   eGFR  Date Value Ref Range Status  06/04/2022 95 > OR = 60 mL/min/1.74m2 Final         Passed - Valid encounter within last 12 months    Recent Outpatient Visits           1 month ago Chronic bilateral low back pain without sciatica   [facility] [place], [place], NP   2 months ago Type 2 diabetes mellitus with hyperglycemia, without long-term current use of insulin (HCC)   [place] [facility] Delles, Gentry Fitz A, RPH-CPP    5 months ago Type 2 diabetes mellitus with hyperglycemia, without long-term current use of insulin (HCC)   [place] [facility] Delles, Jackelyn Poling, RPH-CPP   6 months ago Encounter for general adult medical examination with abnormal findings   [place] [facility] [place], Salvadore Oxford, NP   7 months ago Urinary frequency   [place] [facility] [facility] [place], Salvadore Oxford, NP       Future Appointments             In 3 days [place], Salvadore Oxford, NP [place] [facility]            Passed - Bone Mineral Density or Dexa Scan completed in the last 2 years      Refused Prescriptions Disp Refills   metFORMIN (GLUCOPHAGE-XR) 500 MG 24 hr tablet [Pharmacy Med Name: metFORMIN HCl ER 500 MG Oral Tablet Extended Release 24 Hour] 270 tablet 0    Sig: TAKE 3 TABLETS BY MOUTH ONCE  DAILY WITH BREAKFAST     Endocrinology:  Diabetes - Biguanides Failed - 11/29/2022  6:15 PM      Failed - B12 Level in normal range and within 720 days    No results found for: "VITAMINB12"       Failed - CBC within normal limits and completed in the last 12 months    WBC  Date Value Ref Range Status  06/04/2022 8.6 3.8 - 10.8 Thousand/uL Final   RBC  Date Value Ref Range Status  06/04/2022 4.69 3.80 - 5.10 Million/uL Final   Hemoglobin  Date Value Ref Range Status  06/04/2022 12.6 11.7 - 15.5 g/dL Final   HCT  Date Value Ref Range Status  06/04/2022 39.5 35.0 - 45.0 % Final   MCHC  Date Value Ref Range Status  06/04/2022 31.9 (L) 32.0 - 36.0 g/dL Final   [facility]  Date Value Ref Range Status  06/04/2022 26.9 (L) 27.0 - 33.0 pg Final   MCV  Date Value Ref Range Status  06/04/2022 84.2 80.0 - 100.0 fL Final   No results found for: "PLTCOUNTKUC", "LABPLAT", "POCPLA" RDW  Date Value Ref Range Status  06/04/2022 13.5 11.0 - 15.0 % Final         Passed - Cr in normal range and within 360 days    Creat  Date Value Ref Range Status   06/04/2022 0.67 0.50 - 1.05 mg/dL Final   Creatinine, Urine  Date Value Ref Range Status  12/02/2021 34 20 - 275 mg/dL Final         Passed - HBA1C is between 0 and 7.9 and within 180 days    Hgb A1c MFr Bld  Date Value Ref Range Status  06/04/2022 7.2 (H) <5.7 % of total Hgb Final    Comment:    For someone without known diabetes, a hemoglobin A1c value of 6.5% or greater indicates that they may have  diabetes and this should be confirmed with a follow-up  test. . For someone with known diabetes, a value <7% indicates  that their diabetes is well controlled and a value  greater than or equal to 7% indicates suboptimal  control. A1c targets should be individualized based on  duration of diabetes, age, comorbid conditions, and  other considerations. . Currently, no consensus exists regarding use of hemoglobin A1c for diagnosis of diabetes for children. .          Passed - eGFR in normal range and within 360 days    GFR, Est African American  Date Value Ref Range Status  06/10/2018 91 > OR = 60 mL/min/1.100m2 Final   GFR, Est Non African American  Date Value Ref Range Status  06/10/2018 79 > OR = 60 mL/min/1.66m2 Final   eGFR  Date Value Ref Range Status  06/04/2022 95 > OR = 60 mL/min/1.31m2 Final         Passed - Valid encounter within last 6 months    Recent Outpatient Visits           1 month ago Chronic bilateral low back pain without sciatica   [place] [facility] [place], [place], NP   2 months ago Type 2 diabetes mellitus with hyperglycemia, without long-term current use of insulin [facility])   [place] [facility] Delles, Gentry Fitz A, RPH-CPP   5 months ago Type 2 diabetes mellitus with hyperglycemia, without long-term current use of insulin [facility])   [place] [facility],  Jackelyn Poling, RPH-CPP   6 months ago Encounter for general adult medical examination with abnormal findings   [facility] [place], Salvadore Oxford, NP   7 months ago Urinary frequency   [place] [facility], Salvadore Oxford, NP       Future Appointments             In 3 days [place], Salvadore Oxford, NP [facility], [place]

## 2022-12-03 DIAGNOSIS — M542 Cervicalgia: Secondary | ICD-10-CM | POA: Diagnosis not present

## 2022-12-03 DIAGNOSIS — M25511 Pain in right shoulder: Secondary | ICD-10-CM | POA: Diagnosis not present

## 2022-12-04 ENCOUNTER — Ambulatory Visit: Payer: PPO | Admitting: Internal Medicine

## 2022-12-04 NOTE — Progress Notes (Deleted)
Subjective:    Patient ID: Summer Young, female    DOB: [birth year], 72 y.o.   MRN: [id number]  HPI  Patient presents to clinic today for 19-month follow-up of chronic conditions.  HTN: Her BP today is.  She is not taking any antihypertensive medication at this time.  ECG from 08/2018 reviewed.  HLD: Her last LDL was 40, triglycerides 44, 05/2022.  She denies myalgias on atorvastatin.  She tries to consume a low-fat diet.  DM2: Her last A1c was 7.2%, 05/2022.  She is taking metformin, ozempic and tresiba as prescribed.  Her sugars range.  She checks her feet routinely.  Her last eye exam was 09/2022.  Flu never.  Pneumovax 09/2020.  Prevnar 11/2017.  COVID Pfizer x 3.  Osteoporosis: Managed with alendronate as prescribed.  She tries to get weightbearing exercise daily.  Bone density from 08/2022 reviewed.  OA: Mainly in her back, hips and knees.  She is taking tylenol and using voltaren gel as prescribed.  She does not follow with neurosurgery.  GERD: Triggered by spicy foods.  She takes omeprazole only as needed.  There is no upper GI on file.  Review of Systems     Past Medical History:  Diagnosis Date   Allergy    Chronic pain of both knees    Diabetes mellitus without complication (HCC)    Hyperlipidemia    Hypertension     Current Outpatient Medications  Medication Sig Dispense Refill   acetaminophen (TYLENOL) 650 MG CR tablet Take 650 mg by mouth 2 (two) times daily as needed for pain.     alendronate (FOSAMAX) 70 MG tablet TAKE 1 TABLET BY MOUTH ONCE A WEEK WITH  A  FULL  GLASS  OF  WATER  ON  AN  EMPTY  STOMACH 12 tablet 0   atorvastatin (LIPITOR) 40 MG tablet Take 1 tablet by mouth once daily 90 tablet 3   blood glucose meter kit and supplies Dispense based on patient and insurance preference. Use up to four times daily as directed. (FOR ICD-10 E10.9, E11.9). 1 each 0   Blood Glucose Monitoring Suppl (ONETOUCH VERIO) w/Device KIT Use to check blood sugar as directed.  DX:  E11.65 1 kit 0   cetirizine (ZYRTEC) 10 MG tablet Take 1 tablet (10 mg total) by mouth daily. 30 tablet 0   Cholecalciferol (VITAMIN D3) 50 MCG (2000 UT) capsule Take 1,000 Units by mouth daily.      Continuous Glucose Sensor (FREESTYLE LIBRE 3 SENSOR) MISC Place 1 sensor on the skin every 14 days DX: E11.65 6 each 1   Diclofenac Sodium 3 % GEL Apply 2-3 gm topically, 4 times daily.     Doxepin HCl 5 % CREA Apply 1-2 gm topically, 3 times a day to affected area. If feeling drowsy, apply at bed time.     EPINEPHrine 0.3 mg/0.3 mL IJ SOAJ injection Inject 0.3 mg into the muscle as needed for anaphylaxis. 1 each 1   fluticasone (FLONASE) 50 MCG/ACT nasal spray Place 2 sprays into both nostrils daily. Use for 4-6 weeks then stop and use seasonally or as needed. 16 g 0   insulin degludec (TRESIBA FLEXTOUCH) 100 UNIT/ML FlexTouch Pen Inject 26 Units into the skin daily. (Patient taking differently: Inject 18 Units into the skin daily.) 15 mL 0   lidocaine (XYLOCAINE) 5 % ointment Apply 1 Application topically daily as needed.     metFORMIN (GLUCOPHAGE-XR) 500 MG 24 hr tablet TAKE 3 TABLETS BY MOUTH  ONCE DAILY WITH BREAKFAST 270 tablet 0   moxifloxacin (VIGAMOX) 0.5 % ophthalmic solution Place 1 drop into both eyes.     omeprazole (PRILOSEC) 40 MG capsule TAKE 1 CAPSULE BY MOUTH ONCE DAILY BEFORE BREAKFAST 90 capsule 0   OneTouch Delica Lancets 33G MISC Use to check blood sugar as directed.  DX: E11.65 100 each 1   ONETOUCH ULTRA test strip Check blood sugar 1 x daily as directed 100 each 12   Semaglutide, 1 MG/DOSE, (OZEMPIC, 1 MG/DOSE,) 2 MG/1.5ML SOPN INJECT 1 MG SUBCUTANEOUSLY ONCE A WEEK 4 mL 0   vitamin B-12 (CYANOCOBALAMIN) 1000 MCG tablet Take 1,000 mcg by mouth daily.      No current facility-administered medications for this visit.    Allergies  Allergen Reactions   Aspirin Hives    Other reaction(s): Other (See Comments) Other Reaction: Not Assessed    Insulin Detemir Hives and Rash     Errythema, edema, heat at site of injection did not improve after 2 weeks of use.     Shellfish Allergy Anaphylaxis   Dapagliflozin Other (See Comments)    Yeast infections  Yeast infection  Farxiga   Influenza Vaccines Other (See Comments)    Forxiga   Other     Other reaction(s): Other (See Comments) Uncoded Allergy. Allergen: Shellfish, Other Reaction: Not Assessed    Flu vaccine given 1998 caused anaphylaxis    Family History  Problem Relation Age of Onset   Diabetes Mother    Diabetes Father    Stroke Father    Diabetes Sister    Heart attack Other    Diabetes Sister    Diabetes Sister    Diabetes Sister     Social History   Socioeconomic History   Marital status: Married    Spouse name: Reynoso,Eduardo   Number of children: Not on file   Years of education: Not on file   Highest education level: High school graduate  Occupational History   Occupation: housekeeper    Comment: self-employed   Occupation: Self- Employed  Tobacco Use   Smoking status: Never   Smokeless tobacco: Never  Vaping Use   Vaping status: Never Used  Substance and Sexual Activity   Alcohol use: Yes    Alcohol/week: 0.0 - 1.0 standard drinks of alcohol    Comment: occasional   Drug use: Never   Sexual activity: Yes    Birth control/protection: None  Other Topics Concern   Not on file  Social History Narrative   Not on file   Social Determinants of Health   Financial Resource Strain: Medium Risk (08/28/2022)   Overall Financial Resource Strain (CARDIA)    Difficulty of Paying Living Expenses: Somewhat hard  Food Insecurity: No Food Insecurity (08/28/2022)   Hunger Vital Sign    Worried About Running Out of Food in the Last Year: Never true    Ran Out of Food in the Last Year: Never true  Transportation Needs: No Transportation Needs (08/28/2022)   PRAPARE - Administrator, Civil Service (Medical): No    Lack of Transportation (Non-Medical): No  Physical Activity:  Inactive (08/28/2022)   Exercise Vital Sign    Days of Exercise per Week: 0 days    Minutes of Exercise per Session: 0 min  Stress: Stress Concern Present (08/28/2022)   [employer] of Occupational Health - Occupational Stress Questionnaire    Feeling of Stress : Rather much  Social Connections: Moderately Isolated (08/28/2022)   Social Connection  and Isolation Panel [NHANES]    Frequency of Communication with Friends and Family: More than three times a week    Frequency of Social Gatherings with Friends and Family: Not on file    Attends Religious Services: More than 4 times per year    Active Member of [employer] or Organizations: No    Attends Banker Meetings: Never    Marital Status: Separated  Intimate Partner Violence: Not At Risk (08/28/2022)   Humiliation, Afraid, Rape, and Kick questionnaire    Fear of Current or Ex-Partner: No    Emotionally Abused: No    Physically Abused: No    Sexually Abused: No     Constitutional: Denies fever, malaise, fatigue, headache or abrupt weight changes.  HEENT: Denies eye pain, eye redness, ear pain, ringing in the ears, wax buildup, runny nose, nasal congestion, bloody nose, or sore throat. Respiratory: Denies difficulty breathing, shortness of breath, cough or sputum production.   Cardiovascular: Denies chest pain, chest tightness, palpitations or swelling in the hands or feet.  Gastrointestinal: Patient reports intermittent reflux.  Denies abdominal pain, bloating, constipation, diarrhea or blood in the stool.  GU: Denies urgency, frequency, pain with urination, burning sensation, blood in urine, odor or discharge. Musculoskeletal: Patient reports joint pain.  Denies decrease in range of motion, difficulty with gait, muscle pain or joint swelling.  Skin: Denies redness, rashes, lesions or ulcercations.  Neurological: Denies dizziness, difficulty with memory, difficulty with speech or problems with balance and coordination.   Psych: Denies anxiety, depression, SI/HI.  No other specific complaints in a complete review of systems (except as listed in HPI above).  Objective:   Physical Exam  There were no vitals taken for this visit. Wt Readings from Last 3 Encounters:  11/11/22 158 lb (71.7 kg)  10/03/22 158 lb (71.7 kg)  08/28/22 152 lb (68.9 kg)    General: Appears their stated age, well developed, well nourished in NAD. Skin: Warm, dry and intact. No rashes, lesions or ulcerations noted. HEENT: Head: normal shape and size; Eyes: sclera white, no icterus, conjunctiva pink, PERRLA and EOMs intact; Ears: Tm's gray and intact, normal light reflex; Nose: mucosa pink and moist, septum midline; Throat/Mouth: Teeth present, mucosa pink and moist, no exudate, lesions or ulcerations noted.  Neck:  Neck supple, trachea midline. No masses, lumps or thyromegaly present.  Cardiovascular: Normal rate and rhythm. S1,S2 noted.  No murmur, rubs or gallops noted. No JVD or BLE edema. No carotid bruits noted. Pulmonary/Chest: Normal effort and positive vesicular breath sounds. No respiratory distress. No wheezes, rales or ronchi noted.  Abdomen: Soft and nontender. Normal bowel sounds. No distention or masses noted. Liver, spleen and kidneys non palpable. Musculoskeletal: Normal range of motion. No signs of joint swelling. No difficulty with gait.  Neurological: Alert and oriented. Cranial nerves II-XII grossly intact. Coordination normal.  Psychiatric: Mood and affect normal. Behavior is normal. Judgment and thought content normal.     BMET    Component Value Date/Time   NA 140 06/04/2022 0847   K 4.4 06/04/2022 0847   CL 108 06/04/2022 0847   CO2 24 06/04/2022 0847   GLUCOSE 100 (H) 06/04/2022 0847   BUN 15 06/04/2022 0847   CREATININE 0.67 06/04/2022 0847   CALCIUM 9.0 06/04/2022 0847   GFRNONAA 79 06/10/2018 0824   GFRAA 91 06/10/2018 0824    Lipid Panel     Component Value Date/Time   CHOL 106 06/04/2022  0847   TRIG 44 06/04/2022 0847  HDL 53 06/04/2022 0847   CHOLHDL 2.0 06/04/2022 0847   LDLCALC 40 06/04/2022 0847    CBC    Component Value Date/Time   WBC 8.6 06/04/2022 0847   RBC 4.69 06/04/2022 0847   HGB 12.6 06/04/2022 0847   HCT 39.5 06/04/2022 0847   PLT 341 06/04/2022 0847   MCV 84.2 06/04/2022 0847   MCH 26.9 (L) 06/04/2022 0847   MCHC 31.9 (L) 06/04/2022 0847   RDW 13.5 06/04/2022 0847   LYMPHSABS 2,967 04/12/2019 0818   EOSABS 95 04/12/2019 0818   BASOSABS 34 04/12/2019 0818    Hgb A1C Lab Results  Component Value Date   HGBA1C 7.2 (H) 06/04/2022            Assessment & Plan:      RTC in 6 months for your annual exam Nicki Reaper, NP

## 2022-12-05 ENCOUNTER — Encounter: Payer: Self-pay | Admitting: Internal Medicine

## 2022-12-05 ENCOUNTER — Ambulatory Visit (INDEPENDENT_AMBULATORY_CARE_PROVIDER_SITE_OTHER): Payer: Medicare HMO | Admitting: Internal Medicine

## 2022-12-05 VITALS — BP 122/80 | HR 70 | Ht 61.0 in | Wt 159.0 lb

## 2022-12-05 DIAGNOSIS — E1169 Type 2 diabetes mellitus with other specified complication: Secondary | ICD-10-CM

## 2022-12-05 DIAGNOSIS — G8929 Other chronic pain: Secondary | ICD-10-CM

## 2022-12-05 DIAGNOSIS — M810 Age-related osteoporosis without current pathological fracture: Secondary | ICD-10-CM

## 2022-12-05 DIAGNOSIS — E66811 Obesity, class 1: Secondary | ICD-10-CM

## 2022-12-05 DIAGNOSIS — E1165 Type 2 diabetes mellitus with hyperglycemia: Secondary | ICD-10-CM

## 2022-12-05 DIAGNOSIS — M25561 Pain in right knee: Secondary | ICD-10-CM

## 2022-12-05 DIAGNOSIS — M545 Low back pain, unspecified: Secondary | ICD-10-CM

## 2022-12-05 DIAGNOSIS — I10 Essential (primary) hypertension: Secondary | ICD-10-CM

## 2022-12-05 DIAGNOSIS — E785 Hyperlipidemia, unspecified: Secondary | ICD-10-CM | POA: Diagnosis not present

## 2022-12-05 DIAGNOSIS — K219 Gastro-esophageal reflux disease without esophagitis: Secondary | ICD-10-CM

## 2022-12-05 DIAGNOSIS — E6609 Other obesity due to excess calories: Secondary | ICD-10-CM

## 2022-12-05 DIAGNOSIS — Z6830 Body mass index (BMI) 30.0-30.9, adult: Secondary | ICD-10-CM

## 2022-12-05 DIAGNOSIS — M25562 Pain in left knee: Secondary | ICD-10-CM

## 2022-12-05 NOTE — Assessment & Plan Note (Signed)
A1C and urine microalbumin today Encouraged her to consume a low carb diet and exercise for weight loss Continue metformin, ozempic and tresiba Encouraged routine eye exam Encouraged routine foot exam She declines flu shot  Pneumonia vaccine UTD

## 2022-12-05 NOTE — Assessment & Plan Note (Signed)
Controlled off meds Reinforced DASH diet and exercise for weight loss 

## 2022-12-05 NOTE — Assessment & Plan Note (Signed)
Avoid foods that trigger your reflux Encouraged weight loss as this can help reduce reflux symptoms Continue omeprazole

## 2022-12-05 NOTE — Patient Instructions (Signed)

## 2022-12-05 NOTE — Progress Notes (Signed)
Subjective:    Patient ID: Summer Young, female    DOB: 03-Jan-1951, 72 y.o.   MRN: [id number]  HPI  Patient presents to clinic today for 64-month follow-up of chronic conditions.  HTN: Her BP today is 122/80.  She is not taking any antihypertensive medication at this time.  ECG from 08/2018 reviewed.  HLD: Her last LDL was 40, triglycerides 44, 05/2022.  She denies myalgias on atorvastatin.  She tries to consume a low-fat diet.  DM2: Her last A1c was 7.2%, 05/2022.  She is taking metformin, ozempic and tresiba as prescribed.  Her sugars range low 100's- over 200.  She checks her feet routinely.  Her last eye exam was 09/2022.  Flu never.  Pneumovax 09/2020.  Prevnar 11/2017.  COVID Pfizer x 3.  Osteoporosis: Managed with alendronate as prescribed.  She tries to get weightbearing exercise daily.  Bone density from 08/2022 reviewed.  OA: Mainly in her back, hips and knees.  She is taking tylenol, using voltaren gel and gabapentin as prescribed.  She follows with neurosurgery.  GERD: Triggered by spicy foods.  She takes omeprazole only as needed.  There is no upper GI on file.  Review of Systems     Past Medical History:  Diagnosis Date   Allergy    Chronic pain of both knees    Diabetes mellitus without complication (HCC)    Hyperlipidemia    Hypertension     Current Outpatient Medications  Medication Sig Dispense Refill   acetaminophen (TYLENOL) 650 MG CR tablet Take 650 mg by mouth 2 (two) times daily as needed for pain.     alendronate (FOSAMAX) 70 MG tablet TAKE 1 TABLET BY MOUTH ONCE A WEEK WITH  A  FULL  GLASS  OF  WATER  ON  AN  EMPTY  STOMACH 12 tablet 0   atorvastatin (LIPITOR) 40 MG tablet Take 1 tablet by mouth once daily 90 tablet 3   blood glucose meter kit and supplies Dispense based on patient and insurance preference. Use up to four times daily as directed. (FOR ICD-10 E10.9, E11.9). 1 each 0   Blood Glucose Monitoring Suppl (ONETOUCH VERIO) w/Device KIT Use to check  blood sugar as directed.  DX: E11.65 1 kit 0   cetirizine (ZYRTEC) 10 MG tablet Take 1 tablet (10 mg total) by mouth daily. 30 tablet 0   Cholecalciferol (VITAMIN D3) 50 MCG (2000 UT) capsule Take 1,000 Units by mouth daily.      Continuous Glucose Sensor (FREESTYLE LIBRE 3 SENSOR) MISC Place 1 sensor on the skin every 14 days DX: E11.65 6 each 1   Diclofenac Sodium 3 % GEL Apply 2-3 gm topically, 4 times daily.     Doxepin HCl 5 % CREA Apply 1-2 gm topically, 3 times a day to affected area. If feeling drowsy, apply at bed time.     EPINEPHrine 0.3 mg/0.3 mL IJ SOAJ injection Inject 0.3 mg into the muscle as needed for anaphylaxis. 1 each 1   fluticasone (FLONASE) 50 MCG/ACT nasal spray Place 2 sprays into both nostrils daily. Use for 4-6 weeks then stop and use seasonally or as needed. 16 g 0   insulin degludec (TRESIBA FLEXTOUCH) 100 UNIT/ML FlexTouch Pen Inject 26 Units into the skin daily. (Patient taking differently: Inject 18 Units into the skin daily.) 15 mL 0   lidocaine (XYLOCAINE) 5 % ointment Apply 1 Application topically daily as needed.     metFORMIN (GLUCOPHAGE-XR) 500 MG 24 hr tablet TAKE  3 TABLETS BY MOUTH ONCE DAILY WITH BREAKFAST 270 tablet 0   moxifloxacin (VIGAMOX) 0.5 % ophthalmic solution Place 1 drop into both eyes.     omeprazole (PRILOSEC) 40 MG capsule TAKE 1 CAPSULE BY MOUTH ONCE DAILY BEFORE BREAKFAST 90 capsule 0   OneTouch Delica Lancets 33G MISC Use to check blood sugar as directed.  DX: E11.65 100 each 1   ONETOUCH ULTRA test strip Check blood sugar 1 x daily as directed 100 each 12   Semaglutide, 1 MG/DOSE, (OZEMPIC, 1 MG/DOSE,) 2 MG/1.5ML SOPN INJECT 1 MG SUBCUTANEOUSLY ONCE A WEEK 4 mL 0   vitamin B-12 (CYANOCOBALAMIN) 1000 MCG tablet Take 1,000 mcg by mouth daily.      No current facility-administered medications for this visit.    Allergies  Allergen Reactions   Aspirin Hives    Other reaction(s): Other (See Comments) Other Reaction: Not Assessed     Insulin Detemir Hives and Rash    Errythema, edema, heat at site of injection did not improve after 2 weeks of use.     Shellfish Allergy Anaphylaxis   Dapagliflozin Other (See Comments)    Yeast infections  Yeast infection  Farxiga   Influenza Vaccines Other (See Comments)    Forxiga   Other     Other reaction(s): Other (See Comments) Uncoded Allergy. Allergen: Shellfish, Other Reaction: Not Assessed    Flu vaccine given 1998 caused anaphylaxis    Family History  Problem Relation Age of Onset   Diabetes Mother    Diabetes Father    Stroke Father    Diabetes Sister    Heart attack Other    Diabetes Sister    Diabetes Sister    Diabetes Sister     Social History   Socioeconomic History   Marital status: Married    Spouse name: Reynoso,Eduardo   Number of children: Not on file   Years of education: Not on file   Highest education level: High school graduate  Occupational History   Occupation: housekeeper    Comment: self-employed   Occupation: Self- Employed  Tobacco Use   Smoking status: Never   Smokeless tobacco: Never  Vaping Use   Vaping status: Never Used  Substance and Sexual Activity   Alcohol use: Yes    Alcohol/week: 0.0 - 1.0 standard drinks of alcohol    Comment: occasional   Drug use: Never   Sexual activity: Yes    Birth control/protection: None  Other Topics Concern   Not on file  Social History Narrative   Not on file   Social Determinants of Health   Financial Resource Strain: Medium Risk (08/28/2022)   Overall Financial Resource Strain (CARDIA)    Difficulty of Paying Living Expenses: Somewhat hard  Food Insecurity: No Food Insecurity (08/28/2022)   Hunger Vital Sign    Worried About Running Out of Food in the Last Year: Never true    Ran Out of Food in the Last Year: Never true  Transportation Needs: No Transportation Needs (08/28/2022)   PRAPARE - Administrator, Civil Service (Medical): No    Lack of Transportation  (Non-Medical): No  Physical Activity: Inactive (08/28/2022)   Exercise Vital Sign    Days of Exercise per Week: 0 days    Minutes of Exercise per Session: 0 min  Stress: Stress Concern Present (08/28/2022)   [employer] of Occupational Health - Occupational Stress Questionnaire    Feeling of Stress : Rather much  Social Connections: Moderately Isolated (08/28/2022)  Social Advertising account executive [NHANES]    Frequency of Communication with Friends and Family: More than three times a week    Frequency of Social Gatherings with Friends and Family: Not on file    Attends Religious Services: More than 4 times per year    Active Member of [employer] or Organizations: No    Attends Banker Meetings: Never    Marital Status: Separated  Intimate Partner Violence: Not At Risk (08/28/2022)   Humiliation, Afraid, Rape, and Kick questionnaire    Fear of Current or Ex-Partner: No    Emotionally Abused: No    Physically Abused: No    Sexually Abused: No     Constitutional: Denies fever, malaise, fatigue, headache or abrupt weight changes.  HEENT: Denies eye pain, eye redness, ear pain, ringing in the ears, wax buildup, runny nose, nasal congestion, bloody nose, or sore throat. Respiratory: Denies difficulty breathing, shortness of breath, cough or sputum production.   Cardiovascular: Denies chest pain, chest tightness, palpitations or swelling in the hands or feet.  Gastrointestinal: Patient reports intermittent reflux.  Denies abdominal pain, bloating, constipation, diarrhea or blood in the stool.  GU: Denies urgency, frequency, pain with urination, burning sensation, blood in urine, odor or discharge. Musculoskeletal: Patient reports joint pain.  Denies decrease in range of motion, difficulty with gait, muscle pain or joint swelling.  Skin: Denies redness, rashes, lesions or ulcercations.  Neurological: Denies dizziness, difficulty with memory, difficulty with speech or  problems with balance and coordination.  Psych: Denies anxiety, depression, SI/HI.  No other specific complaints in a complete review of systems (except as listed in HPI above).  Objective:   Physical Exam  BP 122/80   Pulse 70   Ht 5\' 1"  (1.549 m)   Wt 159 lb (72.1 kg)   SpO2 100%   BMI 30.04 kg/m   Wt Readings from Last 3 Encounters:  11/11/22 158 lb (71.7 kg)  10/03/22 158 lb (71.7 kg)  08/28/22 152 lb (68.9 kg)    General: Appears her stated age, obese, in NAD. Skin: Warm, dry and intact. No ulcerations noted. HEENT: Head: normal shape and size; Eyes: sclera white, no icterus, conjunctiva pink, PERRLA and EOMs intact;  Cardiovascular: Normal rate and rhythm. S1,S2 noted.  No murmur, rubs or gallops noted. No JVD or BLE edema. No carotid bruits noted. Pulmonary/Chest: Normal effort and positive vesicular breath sounds. No respiratory distress. No wheezes, rales or ronchi noted.  Abdomen: Soft and nontender. Normal bowel sounds.  Musculoskeletal: Pain with palpation over the lumbar spine.  No difficulty with gait.  Neurological: Alert and oriented. Coordination normal.  Psychiatric: Mood and affect normal. Behavior is normal. Judgment and thought content normal.     BMET    Component Value Date/Time   NA 140 06/04/2022 0847   K 4.4 06/04/2022 0847   CL 108 06/04/2022 0847   CO2 24 06/04/2022 0847   GLUCOSE 100 (H) 06/04/2022 0847   BUN 15 06/04/2022 0847   CREATININE 0.67 06/04/2022 0847   CALCIUM 9.0 06/04/2022 0847   GFRNONAA 79 06/10/2018 0824   GFRAA 91 06/10/2018 0824    Lipid Panel     Component Value Date/Time   CHOL 106 06/04/2022 0847   TRIG 44 06/04/2022 0847   HDL 53 06/04/2022 0847   CHOLHDL 2.0 06/04/2022 0847   LDLCALC 40 06/04/2022 0847    CBC    Component Value Date/Time   WBC 8.6 06/04/2022 0847   RBC 4.69 06/04/2022  0847   HGB 12.6 06/04/2022 0847   HCT 39.5 06/04/2022 0847   PLT 341 06/04/2022 0847   MCV 84.2 06/04/2022 0847    MCH 26.9 (L) 06/04/2022 0847   MCHC 31.9 (L) 06/04/2022 0847   RDW 13.5 06/04/2022 0847   LYMPHSABS 2,967 04/12/2019 0818   EOSABS 95 04/12/2019 0818   BASOSABS 34 04/12/2019 0818    Hgb A1C Lab Results  Component Value Date   HGBA1C 7.2 (H) 06/04/2022            Assessment & Plan:      RTC in 6 months for your annual exam Nicki Reaper, NP

## 2022-12-05 NOTE — Assessment & Plan Note (Signed)
Encouraged diet and exercise for weight loss ?

## 2022-12-05 NOTE — Assessment & Plan Note (Signed)
Continue Tylenol, Voltaren gel and gabapentin She will continue to follow with neurosurgery

## 2022-12-05 NOTE — Assessment & Plan Note (Signed)
CMET and lipid profile today Encouraged her to consume a low fat diet Continue atorvastatin

## 2022-12-05 NOTE — Assessment & Plan Note (Signed)
Continue alendronate Encouraged diet and exercise for weight loss

## 2022-12-06 LAB — CBC
HCT: 43 % (ref 35.0–45.0)
Hemoglobin: 13.7 g/dL (ref 11.7–15.5)
MCH: 27 pg (ref 27.0–33.0)
MCHC: 31.9 g/dL — ABNORMAL LOW (ref 32.0–36.0)
MCV: 84.8 fL (ref 80.0–100.0)
MPV: 9.6 fL (ref 7.5–12.5)
Platelets: 387 10*3/uL (ref 140–400)
RBC: 5.07 10*6/uL (ref 3.80–5.10)
RDW: 13.5 % (ref 11.0–15.0)
WBC: 15.2 10*3/uL — ABNORMAL HIGH (ref 3.8–10.8)

## 2022-12-06 LAB — LIPID PANEL
Cholesterol: 131 mg/dL (ref <200)
HDL: 54 mg/dL (ref >=50)
LDL Cholesterol (Calc): 58 mg/dL
Non-HDL Cholesterol (Calc): 77 mg/dL (ref <130)
Total CHOL/HDL Ratio: 2.4 (calc) (ref <5.0)
Triglycerides: 105 mg/dL (ref <150)

## 2022-12-06 LAB — MICROALBUMIN / CREATININE URINE RATIO
Creatinine, Urine: 57 mg/dL (ref 20–275)
Microalb Creat Ratio: 5 mg/g{creat} (ref <30)
Microalb, Ur: 0.3 mg/dL

## 2022-12-06 LAB — COMPLETE METABOLIC PANEL WITH GFR
AG Ratio: 1.5 (calc) (ref 1.0–2.5)
ALT: 16 U/L (ref 6–29)
AST: 16 U/L (ref 10–35)
Albumin: 4.3 g/dL (ref 3.6–5.1)
Alkaline phosphatase (APISO): 113 U/L (ref 37–153)
BUN: 12 mg/dL (ref 7–25)
CO2: 24 mmol/L (ref 20–32)
Calcium: 9.3 mg/dL (ref 8.6–10.4)
Chloride: 105 mmol/L (ref 98–110)
Creat: 0.78 mg/dL (ref 0.60–1.00)
Globulin: 2.9 g/dL (ref 1.9–3.7)
Glucose, Bld: 126 mg/dL — ABNORMAL HIGH (ref 65–99)
Potassium: 5.1 mmol/L (ref 3.5–5.3)
Sodium: 139 mmol/L (ref 135–146)
Total Bilirubin: 0.4 mg/dL (ref 0.2–1.2)
Total Protein: 7.2 g/dL (ref 6.1–8.1)
eGFR: 82 mL/min/{1.73_m2} (ref >=60)

## 2022-12-06 LAB — HEMOGLOBIN A1C
Hgb A1c MFr Bld: 7.5 %{Hb} — ABNORMAL HIGH (ref <5.7)
Mean Plasma Glucose: 169 mg/dL
eAG (mmol/L): 9.3 mmol/L

## 2022-12-08 ENCOUNTER — Ambulatory Visit: Payer: PPO | Admitting: Pharmacist

## 2022-12-08 DIAGNOSIS — E1165 Type 2 diabetes mellitus with hyperglycemia: Secondary | ICD-10-CM

## 2022-12-08 NOTE — Progress Notes (Signed)
12/08/2022 Name: Kolby Shad MRN: [id number] DOB: Jan 07, 1951  Chief Complaint  Patient presents with   Medication Management   Medication Assistance    Summer Young is a 72 y.o. year old female who presented for a telephone visit.   They were referred to the pharmacist by their PCP for assistance in managing diabetes, hypertension, hyperlipidemia, and medication access.    Subjective:   Care Team: Primary Care Provider: Lorre Munroe, NP ; Next Scheduled Visit: 06/10/2023  Medication Access/Adherence  Current Pharmacy:  [facility] [address] [place] - [address] [place] [address] Phone: [phone] Fax: [phone]   Patient reports affordability concerns with their medications: No  Patient reports access/transportation concerns to their pharmacy: No  Patient reports adherence concerns with their medications:  No        Type 2 Diabetes:   Current medications:  Tresiba 18 units once daily Metformin ER 500 mg - 3 tablets daily with breakfast Ozempic 1 mg once weekly   Patient using Freestyle Libre 3 continuous blood glucose monitor (CGM) Using her iPhone in place of reader devices Review results from Freestyle LibreView:      Attributes recent elevation in A1C/blood sugar to change in eating habits and activity. Reports recently has been eating larger portions of carbohydrates, particularly rice. Attributes change to frustration - limited social support, life changes, etc. However, plans to get back to her previous well-balanced eating habits. - Note patient previously engaged with [facility] LCSW - Reports adopted a dog and that this is helping with her motivation. Also crocheting to help with mood  Denies hypoglycemia Note patient carries glucose tablets with her    Current physical activity: reports now getting more active since adopted her dog. Also started physical therapy for her shoulder x 2 days/week (started last week)    Statin: atorvastatin 40 mg daily     Current medication access support: Enrolled in [place] and Ozempic medication assistance from [employer] for 2024 calendar year - Reports has sufficient supply of Tresiba and Ozempic to last until end of calendar year   Objective:  Lab Results  Component Value Date   HGBA1C 7.5 (H) 12/05/2022    Lab Results  Component Value Date   CREATININE 0.78 12/05/2022   BUN 12 12/05/2022   NA 139 12/05/2022   K 5.1 12/05/2022   CL 105 12/05/2022   CO2 24 12/05/2022    Lab Results  Component Value Date   CHOL 131 12/05/2022   HDL 54 12/05/2022   LDLCALC 58 12/05/2022   TRIG 105 12/05/2022   CHOLHDL 2.4 12/05/2022    Medications Reviewed Today     Reviewed by Manuela Neptune, RPH-CPP (Pharmacist) on 12/08/22 at [phone]  Med List Status: <None>   Medication Order Taking? Sig Documenting Provider Last Dose Status Informant  acetaminophen (TYLENOL) 650 MG CR tablet [id number]  Take 650 mg by mouth 2 (two) times daily as needed for pain. [provider]  Active   alendronate (FOSAMAX) 70 MG tablet [id number]  TAKE 1 TABLET BY MOUTH ONCE A WEEK WITH  A  FULL  GLASS  OF  WATER  ON  AN  EMPTY  STOMACH Baity, Salvadore Oxford, NP  Active   atorvastatin (LIPITOR) 40 MG tablet [id number] Yes Take 1 tablet by mouth once daily Lorre Munroe, NP Taking Active   blood glucose meter kit and supplies [id number]  Dispense based on patient and insurance preference. Use up to four  times daily as directed. (FOR ICD-10 E10.9, E11.9). Tarri Fuller, FNP  Active   Blood Glucose Monitoring Suppl [facility] VERIO) w/Device KIT [id number]  Use to check blood sugar as directed.  DX: E11.65 Lorre Munroe, NP  Active   cetirizine (ZYRTEC) 10 MG tablet [id number]  Take 1 tablet (10 mg total) by mouth daily. Lorre Munroe, NP  Active   Cholecalciferol (VITAMIN D3) 50 MCG (2000 UT) capsule [id number]  Take 1,000 Units by mouth daily.  [provider]  Active    Continuous Glucose Sensor (FREESTYLE LIBRE 3 SENSOR) [place] [id number]  Place 1 sensor on the skin every 14 days DX: E11.65 Lorre Munroe, NP  Active   Diclofenac Sodium 3 % GEL [id number]  Apply 2-3 gm topically, 4 times daily. [provider]  Active   Doxepin HCl 5 % CREA [id number]  Apply 1-2 gm topically, 3 times a day to affected area. If feeling drowsy, apply at bed time. [provider]  Active   EPINEPHrine 0.3 mg/0.3 mL IJ SOAJ injection [id number]  Inject 0.3 mg into the muscle as needed for anaphylaxis. Lorre Munroe, NP  Active   fluticasone [facility]) 50 MCG/ACT nasal spray [id number]  Place 2 sprays into both nostrils daily. Use for 4-6 weeks then stop and use seasonally or as needed. Lorre Munroe, NP  Active   gabapentin (NEURONTIN) 100 MG capsule [id number]  Take 100 mg by mouth at bedtime. [provider]  Active   insulin degludec (TRESIBA FLEXTOUCH) 100 UNIT/ML FlexTouch Pen [id number] Yes Inject 26 Units into the skin daily.  Patient taking differently: Inject 18 Units into the skin daily.   Lorre Munroe, NP Taking Active   lidocaine (XYLOCAINE) 5 % ointment [id number]  Apply 1 Application topically daily as needed. [provider]  Active   metFORMIN (GLUCOPHAGE-XR) 500 MG 24 hr tablet [id number] Yes TAKE 3 TABLETS BY MOUTH ONCE DAILY WITH BREAKFAST Baity, Salvadore Oxford, NP Taking Active   moxifloxacin (VIGAMOX) 0.5 % ophthalmic solution [id number]  Place 1 drop into both eyes. [provider]  Active   omeprazole (PRILOSEC) 40 MG capsule [id number]  TAKE 1 CAPSULE BY MOUTH ONCE DAILY BEFORE BREAKFAST Baity, Salvadore Oxford, NP  Active            Med Note Perlie Gold Aug 28, 2022  3:01 PM) Takes PRN   Dola Argyle Lancets 33G MISC [id number]  Use to check blood sugar as directed.  DX: E11.65 Lorre Munroe, NP  Active   [facility] ULTRA test strip [id number]  Check blood sugar 1 x daily as directed Smitty Cords, DO  Active    Semaglutide, 1 MG/DOSE, (OZEMPIC, 1 MG/DOSE,) 2 MG/1.5ML SOPN [id number] Yes INJECT 1 MG SUBCUTANEOUSLY ONCE A WEEK Baity, Salvadore Oxford, NP Taking Active   vitamin B-12 (CYANOCOBALAMIN) 1000 MCG tablet [id number]  Take 1,000 mcg by mouth daily.  [provider]  Active               Assessment/Plan:   Encourage patient to start using weekly pillbox and weekly phone alarm for alendronate/Ozempic  Encourage patient to follow up with LCSW Chrystal Land - Will send message to Chrystal as patient looking for support as has been going through a difficult time since moving and looking for help with finding social support  Diabetes: - Encourage patient to get back to having regular well-balanced meals and snacks, while controlling carbohydrate portion sizes Patient states  has cleaned out her fridge and is planning to go to the store today to pick up more protein options and non-starchy vegetables - Have counseled patient on symptoms of/how to manage low blood sugar - Patient to continue to use CGM as feedback on dietary choices/habits to aid with glucose control. Patient to follow up with office if needed for readings outside of established parameters or symptoms - Will collaborate with PCP and CPhT to aid patient with re-enrollment for patient assistance from [employer] for [place], [place] and pen needles for 2025 calendar year   Follow Up Plan: Clinic Pharmacist will follow up with patient by telephone on 01/12/2023 at 9:15 am   Estelle Grumbles, PharmD, [facility] Clinical Pharmacist [facility] [phone]

## 2022-12-08 NOTE — Patient Instructions (Signed)
Goals Addressed             This Visit's Progress    Pharmacy Goals       Our goal A1c is less than 7%. This corresponds with fasting sugars less than 130 and 2 hour after meal sugars less than 180. Please check your blood sugars and keep log of results  Our goal bad cholesterol, or LDL, is less than 70 . This is why it is important to continue taking your atorvastatin  Check your blood pressure periodically, and any time you have concerning symptoms like headache, chest pain, dizziness, shortness of breath, or vision changes.   Our goal is less than 130/80.  To appropriately check your blood pressure, make sure you do the following:  1) Avoid caffeine, exercise, or tobacco products for 30 minutes before checking. Empty your bladder. 2) Sit with your back supported in a flat-backed chair. Rest your arm on something flat (arm of the chair, table, etc). 3) Sit still with your feet flat on the floor, resting, for at least 5 minutes.  4) Check your blood pressure. Take 1-2 readings.  5) Write down these readings and bring with you to any provider appointments.  Bring your home blood pressure machine with you to a provider's office for accuracy comparison at least once a year.   Make sure you take your blood pressure medications before you come to any office visit, even if you were asked to fast for labs.  Feel free to call me with any questions or concerns. I look forward to our next call!  Estelle Grumbles, PharmD, [facility] Clinical Pharmacist [facility] [phone]

## 2022-12-10 ENCOUNTER — Encounter: Payer: Self-pay | Admitting: Internal Medicine

## 2022-12-10 DIAGNOSIS — M25511 Pain in right shoulder: Secondary | ICD-10-CM | POA: Diagnosis not present

## 2022-12-10 DIAGNOSIS — M542 Cervicalgia: Secondary | ICD-10-CM | POA: Diagnosis not present

## 2022-12-12 ENCOUNTER — Telehealth: Payer: Self-pay | Admitting: Pharmacy Technician

## 2022-12-12 DIAGNOSIS — Z5986 Financial insecurity: Secondary | ICD-10-CM

## 2022-12-12 NOTE — Progress Notes (Signed)
[facility] Customer service manager [facility])                                            [facility] Quality Pharmacy Team    12/12/2022  Acey Dobberstein 09/22/1950 [id number]                                      Medication Assistance Referral  Referral From:  [facility] PharmD Estelle Grumbles  Medication/Company: Franki Monte / [employer] Patient application portion:  Mining engineer portion: Faxed  to Nicki Reaper, NP Provider address/fax verified via: Office website  Medication/Company: Evaristo Bury / [employer] Patient application portion:  Mining engineer portion: Faxed  to Nicki Reaper, NP Provider address/fax verified via: Office website  Pattricia Boss, CPhT [place]  Office: [phone] Fax: [phone] Email: [email or web address]

## 2022-12-15 DIAGNOSIS — M25511 Pain in right shoulder: Secondary | ICD-10-CM | POA: Diagnosis not present

## 2022-12-15 DIAGNOSIS — M542 Cervicalgia: Secondary | ICD-10-CM | POA: Diagnosis not present

## 2022-12-17 DIAGNOSIS — M542 Cervicalgia: Secondary | ICD-10-CM | POA: Diagnosis not present

## 2022-12-17 DIAGNOSIS — M25511 Pain in right shoulder: Secondary | ICD-10-CM | POA: Diagnosis not present

## 2022-12-22 DIAGNOSIS — M25511 Pain in right shoulder: Secondary | ICD-10-CM | POA: Diagnosis not present

## 2022-12-22 DIAGNOSIS — M542 Cervicalgia: Secondary | ICD-10-CM | POA: Diagnosis not present

## 2022-12-24 DIAGNOSIS — M25511 Pain in right shoulder: Secondary | ICD-10-CM | POA: Diagnosis not present

## 2022-12-24 DIAGNOSIS — M542 Cervicalgia: Secondary | ICD-10-CM | POA: Diagnosis not present

## 2022-12-31 DIAGNOSIS — M542 Cervicalgia: Secondary | ICD-10-CM | POA: Diagnosis not present

## 2022-12-31 DIAGNOSIS — M25511 Pain in right shoulder: Secondary | ICD-10-CM | POA: Diagnosis not present

## 2023-01-01 ENCOUNTER — Other Ambulatory Visit: Payer: Self-pay | Admitting: Internal Medicine

## 2023-01-01 DIAGNOSIS — E1169 Type 2 diabetes mellitus with other specified complication: Secondary | ICD-10-CM

## 2023-01-01 DIAGNOSIS — E1165 Type 2 diabetes mellitus with hyperglycemia: Secondary | ICD-10-CM

## 2023-01-01 NOTE — Telephone Encounter (Signed)
Requested Prescriptions  Pending Prescriptions Disp Refills   atorvastatin (LIPITOR) 40 MG tablet [Pharmacy Med Name: Atorvastatin Calcium 40 MG Oral Tablet] 90 tablet 3    Sig: Take 1 tablet by mouth once daily     Cardiovascular:  Antilipid - Statins Failed - 01/01/2023 12:39 PM      Failed - Lipid Panel in normal range within the last 12 months    Cholesterol  Date Value Ref Range Status  12/05/2022 131 <200 mg/dL Final   LDL Cholesterol (Calc)  Date Value Ref Range Status  12/05/2022 58 mg/dL (calc) Final    Comment:    Reference range: <100 . Desirable range <100 mg/dL for primary prevention;   <70 mg/dL for patients with CHD or diabetic patients  with > or = 2 CHD risk factors. Marland Kitchen LDL-C is now calculated using the Martin-Hopkins  calculation, which is a validated novel method providing  better accuracy than the Friedewald equation in the  estimation of LDL-C.  Horald Pollen et al. Lenox Ahr. 5784;696(29): 2061-2068  (http://education.[employer].com/faq/FAQ164)    HDL  Date Value Ref Range Status  12/05/2022 54 > OR = 50 mg/dL Final   Triglycerides  Date Value Ref Range Status  12/05/2022 105 <150 mg/dL Final         Passed - Patient is not pregnant      Passed - Valid encounter within last 12 months    Recent Outpatient Visits           3 weeks ago Type 2 diabetes mellitus with hyperglycemia, without long-term current use of insulin (HCC)   [place] [facility] Delles, Gentry Fitz A, RPH-CPP   3 weeks ago Type 2 diabetes mellitus with hyperglycemia, without long-term current use of insulin [facility])   [place] [facility] [place], [place] W, NP   3 months ago Chronic bilateral low back pain without sciatica   [place] [facility] [place], [place] W, NP   3 months ago Type 2 diabetes mellitus with hyperglycemia, without long-term current use of insulin [facility])   [place] [facility] Delles, Gentry Fitz  A, RPH-CPP   6 months ago Type 2 diabetes mellitus with hyperglycemia, without long-term current use of insulin [facility])   [place] [facility] Delles, Jackelyn Poling, RPH-CPP       Future Appointments             In 5 months Baity, Salvadore Oxford, NP  [facility], [facility]

## 2023-01-06 ENCOUNTER — Other Ambulatory Visit: Payer: Self-pay | Admitting: Internal Medicine

## 2023-01-07 DIAGNOSIS — M5416 Radiculopathy, lumbar region: Secondary | ICD-10-CM | POA: Diagnosis not present

## 2023-01-07 DIAGNOSIS — M48062 Spinal stenosis, lumbar region with neurogenic claudication: Secondary | ICD-10-CM | POA: Diagnosis not present

## 2023-01-07 DIAGNOSIS — M5412 Radiculopathy, cervical region: Secondary | ICD-10-CM | POA: Diagnosis not present

## 2023-01-08 ENCOUNTER — Other Ambulatory Visit: Payer: Self-pay | Admitting: Pharmacy Technician

## 2023-01-08 DIAGNOSIS — Z5986 Financial insecurity: Secondary | ICD-10-CM

## 2023-01-08 NOTE — Telephone Encounter (Signed)
Requested Prescriptions  Pending Prescriptions Disp Refills   Continuous Glucose Sensor (FREESTYLE LIBRE 3 SENSOR) MISC [Pharmacy Med Name: FREESTYLE LIBRE 3 SENSOR KIT] 6 each 0    Sig: PLACE 1 SENSOR ON THE SKIN EVERY 14 DAYS     Endocrinology: Diabetes - Testing Supplies Passed - 01/06/2023  3:03 PM      Passed - Valid encounter within last 12 months    Recent Outpatient Visits           1 month ago Type 2 diabetes mellitus with hyperglycemia, without long-term current use of insulin (HCC)   [place] [facility] Delles, Gentry Fitz A, RPH-CPP   1 month ago Type 2 diabetes mellitus with hyperglycemia, without long-term current use of insulin [facility])   [place] [facility] [place], [place] W, NP   3 months ago Chronic bilateral low back pain without sciatica   [place] [facility] [place], [place] W, NP   4 months ago Type 2 diabetes mellitus with hyperglycemia, without long-term current use of insulin [facility])   [place] [facility] Delles, Gentry Fitz A, RPH-CPP   6 months ago Type 2 diabetes mellitus with hyperglycemia, without long-term current use of insulin [facility])    [facility] Delles, Jackelyn Poling, RPH-CPP       Future Appointments             In 5 months Baity, Salvadore Oxford, NP  [place] [facility], [facility]

## 2023-01-08 NOTE — Progress Notes (Signed)
Pharmacy Medication Assistance Program Note    01/08/2023  Patient ID: Summer Young, female   DOB: 1951/02/04, 72 y.o.   MRN: [id number]     01/08/2023  Outreach Medication One  Manufacturer Medication One [employer] [facility] Ozempic  Dose of Ozempic 4mg /76ml  Date Application Received From Patient 12/31/2022  Application Items Received From Patient Proof of Income;Application;Other  Date Application Received From Provider 12/17/2022  Date Application Submitted to Manufacturer 01/07/2023  Method Application Sent to Manufacturer Fax          01/08/2023  Outreach Medication Two  Manufacturer Medication Two [employer]  [employer] [facility] Tresiba  Dose of Tresiba 100u/ml  Type of Radiographer, therapeutic Assistance  Date Application Received From Patient 12/31/2022  Application Items Received From Patient Application;Proof of Income;Other  Date Application Received From Provider 12/17/2022  Method Application Sent to Manufacturer Fax  Date Application Submitted to Manufacturer 01/07/2023       Signature   Kristopher Glee [facility]  Office: [phone] Fax: [phone] Email: [email or web address]

## 2023-01-09 ENCOUNTER — Other Ambulatory Visit: Payer: Self-pay | Admitting: Internal Medicine

## 2023-01-12 ENCOUNTER — Telehealth: Payer: Self-pay

## 2023-01-12 ENCOUNTER — Other Ambulatory Visit: Payer: PPO | Admitting: Pharmacist

## 2023-01-12 ENCOUNTER — Encounter: Payer: Self-pay | Admitting: Pharmacist

## 2023-01-12 NOTE — Telephone Encounter (Signed)
Freestyle Libre on back order at [employer]. Reached out to patient to advise and see if there was another pharmacy to use.

## 2023-01-12 NOTE — Progress Notes (Signed)
01/12/2023 Name: Summer Young MRN: [id number] DOB: 1950-05-20  Chief Complaint  Patient presents with   Medication Assistance   Medication Management    Summer Young is a 72 y.o. year old female who presented for a telephone visit.   They were referred to the pharmacist by their PCP for assistance in managing diabetes, hypertension, hyperlipidemia, and medication access.    Subjective:  Care Team: Primary Care Provider: Lorre Munroe, NP ; Next Scheduled Visit: 06/10/2023  Medication Access/Adherence  Current Pharmacy:  [facility] [address], [place] - [address] [place] [address] Phone: [phone] Fax: [phone]   Patient reports affordability concerns with their medications: No  Patient reports access/transportation concerns to their pharmacy: No  Patient reports adherence concerns with their medications:  No     Denies using weekly pillbox, but reports has her own strategy (keeping track of when takes her medications)    Type 2 Diabetes:   Current medications:  - Tresiba 19 units once daily - Metformin ER 500 mg - 3 tablets daily with breakfast - Ozempic 1 mg once weekly   Reports has not recently been using Freestyle Libre 3 continuous blood glucose monitor (CGM) as pharmacy needed new prescription for the sensors, but note new prescription sent to pharmacy on 11/21 and patient plans to follow up with [facility] to see if product available   Reports checking blood sugar using glucometer and recent morning fasting reading ranging 85-116; after meals 167-250 - Using blood sugar checks as feedback on dietary choices. Attributes recent post-meal elevated blood sugar readings to larger portions of fruit such as bananas and grapes.   Denies hypoglycemia Note patient carries glucose tablets with her    Current physical activity: walking her dog~20 minutes daily   Statin: atorvastatin 40 mg daily     Current medication  access support: Enrolled in [place] and Ozempic medication assistance from [employer] for 2024 calendar year - Reports has sufficient supply of Tresiba and Ozempic to last until end of calendar year - Note patient working with PCP and CPhT to complete re-enrollment in assistance program for 2025       Objective:  Lab Results  Component Value Date   HGBA1C 7.5 (H) 12/05/2022    Lab Results  Component Value Date   CREATININE 0.78 12/05/2022   BUN 12 12/05/2022   NA 139 12/05/2022   K 5.1 12/05/2022   CL 105 12/05/2022   CO2 24 12/05/2022    Lab Results  Component Value Date   CHOL 131 12/05/2022   HDL 54 12/05/2022   LDLCALC 58 12/05/2022   TRIG 105 12/05/2022   CHOLHDL 2.4 12/05/2022    Medications Reviewed Today     Reviewed by Manuela Neptune, RPH-CPP (Pharmacist) on 01/12/23 at 0929  Med List Status: <None>   Medication Order Taking? Sig Documenting Provider Last Dose Status Informant  acetaminophen (TYLENOL) 650 MG CR tablet [id number] Yes Take 650 mg by mouth 2 (two) times daily as needed for pain. [provider] Taking Active   alendronate (FOSAMAX) 70 MG tablet [id number] Yes TAKE 1 TABLET BY MOUTH ONCE A WEEK WITH  A  FULL  GLASS  OF  WATER  ON  AN  EMPTY  STOMACH Baity, Salvadore Oxford, NP Taking Active   atorvastatin (LIPITOR) 40 MG tablet [id number] Yes Take 1 tablet by mouth once daily Lorre Munroe, NP Taking Active   blood glucose meter kit and supplies [id number]  Dispense based on patient and insurance preference. Use up to four times daily as directed. (FOR ICD-10 E10.9, E11.9). Tarri Fuller, FNP  Active   Blood Glucose Monitoring Suppl [facility] VERIO) w/Device KIT [id number]  Use to check blood sugar as directed.  DX: E11.65 Lorre Munroe, NP  Active   cetirizine (ZYRTEC) 10 MG tablet [id number] Yes Take 1 tablet (10 mg total) by mouth daily.  Patient taking differently: Take 10 mg by mouth daily as needed.   Lorre Munroe, NP Taking Active    Cholecalciferol (VITAMIN D3) 50 MCG (2000 UT) capsule [id number] Yes Take 1,000 Units by mouth daily.  [provider] Taking Active   Continuous Glucose Sensor (FREESTYLE LIBRE 3 SENSOR) [place] [id number]  PLACE 1 SENSOR ON THE SKIN EVERY 14 DAYS Lorre Munroe, NP  Active   Diclofenac Sodium 3 % GEL [id number] Yes Apply 2-3 gm topically, 4 times daily. [provider] Taking Active   EPINEPHrine 0.3 mg/0.3 mL IJ SOAJ injection [id number]  Inject 0.3 mg into the muscle as needed for anaphylaxis. Lorre Munroe, NP  Active   fluticasone [facility]) 50 MCG/ACT nasal spray [id number] Yes Place 2 sprays into both nostrils daily. Use for 4-6 weeks then stop and use seasonally or as needed. Lorre Munroe, NP Taking Active   gabapentin (NEURONTIN) 100 MG capsule [id number] Yes Take 100 mg by mouth at bedtime. [provider] Taking Active   insulin degludec (TRESIBA FLEXTOUCH) 100 UNIT/ML FlexTouch Pen [id number] Yes Inject 26 Units into the skin daily.  Patient taking differently: Inject 19 Units into the skin daily.   Lorre Munroe, NP Taking Active   metFORMIN (GLUCOPHAGE-XR) 500 MG 24 hr tablet [id number] Yes TAKE 3 TABLETS BY MOUTH ONCE DAILY WITH BREAKFAST Lorre Munroe, NP Taking Active   omeprazole (PRILOSEC) 40 MG capsule [id number]  TAKE 1 CAPSULE BY MOUTH ONCE DAILY BEFORE BREAKFAST Baity, Salvadore Oxford, NP  Active            Med Note Perlie Gold Aug 28, 2022  3:01 PM) Takes PRN   Dola Argyle Lancets 33G MISC [id number]  Use to check blood sugar as directed.  DX: E11.65 Lorre Munroe, NP  Active   [facility] ULTRA test strip [id number]  Check blood sugar 1 x daily as directed Smitty Cords, DO  Active   Semaglutide, 1 MG/DOSE, (OZEMPIC, 1 MG/DOSE,) 2 MG/1.5ML SOPN [id number] Yes INJECT 1 MG SUBCUTANEOUSLY ONCE A WEEK Baity, Salvadore Oxford, NP Taking Active   vitamin B-12 (CYANOCOBALAMIN) 1000 MCG tablet [id number] Yes Take 1,000 mcg by mouth daily.  [provider] Taking Active               Assessment/Plan:      Diabetes: - Encourage patient to get back to having regular well-balanced meals and snacks, while controlling carbohydrate portion sizes - Have counseled patient on symptoms of/how to manage low blood sugar - Patient to use CGM as feedback on dietary choices/habits to aid with glucose control. Patient to follow up with office if needed for readings outside of established parameters or symptoms - Collaborating with PCP and CPhT to aid patient with re-enrollment for patient assistance from [employer] for [place], [place] and pen needles for 2025 calendar year - Send patient handout on healthy meal planning via MyChart as requested - Will send message to PCP regarding request from patient to meet with a dietitian   Follow Up Plan: Clinic  Pharmacist will follow up with patient by telephone on 03/23/2023 at 8:30 AM    Estelle Grumbles, PharmD, [facility] Clinical Pharmacist [facility] [phone]

## 2023-01-12 NOTE — Patient Instructions (Signed)
Goals Addressed             This Visit's Progress    Pharmacy Goals       Our goal A1c is less than 7%. This corresponds with fasting sugars less than 130 and 2 hour after meal sugars less than 180. Please check your blood sugars and keep log of results  Our goal bad cholesterol, or LDL, is less than 70 . This is why it is important to continue taking your atorvastatin  Check your blood pressure periodically, and any time you have concerning symptoms like headache, chest pain, dizziness, shortness of breath, or vision changes.   Our goal is less than 130/80.  To appropriately check your blood pressure, make sure you do the following:  1) Avoid caffeine, exercise, or tobacco products for 30 minutes before checking. Empty your bladder. 2) Sit with your back supported in a flat-backed chair. Rest your arm on something flat (arm of the chair, table, etc). 3) Sit still with your feet flat on the floor, resting, for at least 5 minutes.  4) Check your blood pressure. Take 1-2 readings.  5) Write down these readings and bring with you to any provider appointments.  Bring your home blood pressure machine with you to a provider's office for accuracy comparison at least once a year.   Make sure you take your blood pressure medications before you come to any office visit, even if you were asked to fast for labs.  Feel free to call me with any questions or concerns. I look forward to our next call!  Estelle Grumbles, PharmD, [facility] Clinical Pharmacist [facility] [phone]

## 2023-01-12 NOTE — Telephone Encounter (Signed)
Reordered 01/08/23 6 each   Requested Prescriptions  Refused Prescriptions Disp Refills   Continuous Glucose Sensor (FREESTYLE LIBRE 3 SENSOR) MISC [Pharmacy Med Name: FREESTYLE LIBRE 3 SENSOR KIT] 6 each 0    Sig: PLACE 1 SENSOR ON THE SKIN EVERY 14 DAYS     Endocrinology: Diabetes - Testing Supplies Passed - 01/09/2023  1:19 PM      Passed - Valid encounter within last 12 months    Recent Outpatient Visits           1 month ago Type 2 diabetes mellitus with hyperglycemia, without long-term current use of insulin (HCC)   [place] [facility] Delles, Gentry Fitz A, RPH-CPP   1 month ago Type 2 diabetes mellitus with hyperglycemia, without long-term current use of insulin [facility])   [place] [facility] [place], [place] W, NP   3 months ago Chronic bilateral low back pain without sciatica   [place] [facility] [place], [place] W, NP   4 months ago Type 2 diabetes mellitus with hyperglycemia, without long-term current use of insulin [facility])   [place] [facility] Delles, Gentry Fitz A, RPH-CPP   6 months ago Type 2 diabetes mellitus with hyperglycemia, without long-term current use of insulin [facility])   [place] [facility] Delles, Jackelyn Poling, RPH-CPP       Future Appointments             In 4 months Baity, Salvadore Oxford, NP [facility], [facility]

## 2023-01-23 ENCOUNTER — Other Ambulatory Visit: Payer: Self-pay | Admitting: Pharmacist

## 2023-01-23 NOTE — Progress Notes (Unsigned)
   01/23/2023  Patient ID: Summer Young, female   DOB: 1950-08-07, 72 y.o.   MRN: [id number]  Receive a message from patient requesting a call back regarding her Novo Nordisk patient assistance application  Return call to patient who advises that she received a letter from [employer] stating that program needs information about her insurance coverage in order to continue processing her application.   From review of information submitted to program by CPhT Pattricia Boss to [employer] on 01/07/2023, note copies of both patient's prescription and medical Medicare cards were included.  Note aware from speaking with [employer] that

## 2023-01-26 ENCOUNTER — Telehealth: Payer: Self-pay | Admitting: Pharmacy Technician

## 2023-01-26 DIAGNOSIS — Z5986 Financial insecurity: Secondary | ICD-10-CM

## 2023-01-26 NOTE — Patient Instructions (Signed)
 Goals Addressed             This Visit's Progress    Pharmacy Goals       Our goal A1c is less than 7%. This corresponds with fasting sugars less than 130 and 2 hour after meal sugars less than 180. Please check your blood sugars and keep log of results  Our goal bad cholesterol, or LDL, is less than 70 . This is why it is important to continue taking your atorvastatin  Check your blood pressure periodically, and any time you have concerning symptoms like headache, chest pain, dizziness, shortness of breath, or vision changes.   Our goal is less than 130/80.  To appropriately check your blood pressure, make sure you do the following:  1) Avoid caffeine, exercise, or tobacco products for 30 minutes before checking. Empty your bladder. 2) Sit with your back supported in a flat-backed chair. Rest your arm on something flat (arm of the chair, table, etc). 3) Sit still with your feet flat on the floor, resting, for at least 5 minutes.  4) Check your blood pressure. Take 1-2 readings.  5) Write down these readings and bring with you to any provider appointments.  Bring your home blood pressure machine with you to a provider's office for accuracy comparison at least once a year.   Make sure you take your blood pressure medications before you come to any office visit, even if you were asked to fast for labs.  Feel free to call me with any questions or concerns. I look forward to our next call!  Estelle Grumbles, PharmD, [facility] Clinical Pharmacist [facility] [phone]

## 2023-01-26 NOTE — Progress Notes (Signed)
Pharmacy Medication Assistance Program Note    01/26/2023  Patient ID: Ialiyah Micke, female   DOB: 1950-04-15, 72 y.o.   MRN: [id number]     01/08/2023 01/26/2023  Outreach Medication One  Manufacturer Medication One [employer] [facility] Ozempic   Dose of Ozempic 4mg /42ml   Date Application Received From Patient 12/31/2022   Application Items Received From Patient Proof of Income;Application;Other   Date Application Received From Provider 12/17/2022   Date Application Submitted to Manufacturer 01/07/2023   Method Application Sent to Manufacturer Fax   Patient Assistance Determination  Approved  Approval Start Date  02/18/2023  Approval End Date  02/17/2024  Patient Notification Method  MyChart  Additional Outreach Contact  Provider  Contacted Provider  Message         01/08/2023 01/26/2023  Outreach Medication Two  Manufacturer Medication Two [employer] Nordisk   Nordisk Drugs Evaristo Bury   Dose of Tresiba 100u/ml   Type of Radiographer, therapeutic Assistance   Date Application Received From Patient 12/31/2022   Application Items Received From Patient Application;Proof of Income;Other   Date Application Received From Provider 12/17/2022   Method Application Sent to Manufacturer Fax   Date Application Submitted to Manufacturer 01/07/2023   Patient Assistance Determination  Approved  Approval Start Date  02/18/2023  Patient Notification Method  MyChart  Additional Outreach Contact  Provider  Contacted Provider  Message     Care coordination call placed to [employer] in regard to [place] application. Spoke to [place] who informs patient is APPROVED 02/18/23-02/17/24. Medications will process automatically based on last fill dates in 2024 and be delivered to the prescriber's office Patient may call Novo Nordisk at any time to check on next fill or delivery date as the medications auto fill and ship. [employer] phone number is [phone].  Pattricia Boss, CPhT [place]   Office: [phone] Fax: [phone] Email: [email or web address]

## 2023-02-01 ENCOUNTER — Other Ambulatory Visit: Payer: Self-pay | Admitting: Internal Medicine

## 2023-02-01 DIAGNOSIS — M810 Age-related osteoporosis without current pathological fracture: Secondary | ICD-10-CM

## 2023-02-03 NOTE — Telephone Encounter (Signed)
Requested Prescriptions  Pending Prescriptions Disp Refills   alendronate (FOSAMAX) 70 MG tablet [Pharmacy Med Name: Alendronate Sodium 70 MG Oral Tablet] 12 tablet 0    Sig: TAKE 1 TABLET BY MOUTH ONCE A WEEK WITH  A  FULL  GLASS  OF  WATER  ON  AN  EMPTY  STOMACH     Endocrinology:  Bisphosphonates Failed - 02/03/2023  7:43 AM      Failed - Vitamin D in normal range and within 360 days    No results found for: "IO9629BM8", "UX3244WN0", "VD125OH2TOT", "25OHVITD3", "25OHVITD2", "25OHVITD1", "VD25OH"       Failed - Mg Level in normal range and within 360 days    No results found for: "MG"       Failed - Phosphate in normal range and within 360 days    No results found for: "PHOS"       Passed - Ca in normal range and within 360 days    Calcium  Date Value Ref Range Status  12/05/2022 9.3 8.6 - 10.4 mg/dL Final         Passed - Cr in normal range and within 360 days    Creat  Date Value Ref Range Status  12/05/2022 0.78 0.60 - 1.00 mg/dL Final   Creatinine, Urine  Date Value Ref Range Status  12/05/2022 57 20 - 275 mg/dL Final         Passed - eGFR is 30 or above and within 360 days    GFR, Est African American  Date Value Ref Range Status  06/10/2018 91 > OR = 60 mL/min/1.8m2 Final   GFR, Est Non African American  Date Value Ref Range Status  06/10/2018 79 > OR = 60 mL/min/1.84m2 Final   eGFR  Date Value Ref Range Status  12/05/2022 82 > OR = 60 mL/min/1.40m2 Final         Passed - Valid encounter within last 12 months    Recent Outpatient Visits           1 month ago Type 2 diabetes mellitus with hyperglycemia, without long-term current use of insulin (HCC)   [place] [facility] Delles, Gentry Fitz A, RPH-CPP   2 months ago Type 2 diabetes mellitus with hyperglycemia, without long-term current use of insulin [facility])   [place] [facility] [place], [place] W, NP   4 months ago Chronic bilateral low back pain without sciatica    [place] [facility] [place], Salvadore Oxford, NP   4 months ago Type 2 diabetes mellitus with hyperglycemia, without long-term current use of insulin [facility])   [place] [facility] Delles, Gentry Fitz A, RPH-CPP   7 months ago Type 2 diabetes mellitus with hyperglycemia, without long-term current use of insulin [facility])   [place] [facility] Delles, Jackelyn Poling, RPH-CPP       Future Appointments             In 4 months Baity, Salvadore Oxford, NP [place] [facility], [facility]            Passed - Bone Mineral Density or Dexa Scan completed in the last 2 years

## 2023-02-08 ENCOUNTER — Encounter: Payer: Self-pay | Admitting: Internal Medicine

## 2023-02-26 ENCOUNTER — Other Ambulatory Visit: Payer: Self-pay | Admitting: Internal Medicine

## 2023-02-26 DIAGNOSIS — E1165 Type 2 diabetes mellitus with hyperglycemia: Secondary | ICD-10-CM

## 2023-03-02 NOTE — Telephone Encounter (Signed)
 Requested Prescriptions  Pending Prescriptions Disp Refills   metFORMIN  (GLUCOPHAGE -XR) 500 MG 24 hr tablet [Pharmacy Med Name: metFORMIN  HCl ER 500 MG Oral Tablet Extended Release 24 Hour] 270 tablet 0    Sig: TAKE 3 TABLETS BY MOUTH ONCE DAILY WITH BREAKFAST     Endocrinology:  Diabetes - Biguanides Failed - 03/02/2023 10:20 AM      Failed - B12 Level in normal range and within 720 days    No results found for: VITAMINB12       Failed - CBC within normal limits and completed in the last 12 months    WBC  Date Value Ref Range Status  12/05/2022 15.2 (H) 3.8 - 10.8 Thousand/uL Final   RBC  Date Value Ref Range Status  12/05/2022 5.07 3.80 - 5.10 Million/uL Final   Hemoglobin  Date Value Ref Range Status  12/05/2022 13.7 11.7 - 15.5 g/dL Final   HCT  Date Value Ref Range Status  12/05/2022 43.0 35.0 - 45.0 % Final   MCHC  Date Value Ref Range Status  12/05/2022 31.9 (L) 32.0 - 36.0 g/dL Final    Comment:    For adults, a slight decrease in the calculated MCHC value (in the range of 30 to 32 g/dL) is most likely not clinically significant; however, it should be interpreted with caution in correlation with other red cell parameters and the patient's clinical condition.    [facility]  Date Value Ref Range Status  12/05/2022 27.0 27.0 - 33.0 pg Final   MCV  Date Value Ref Range Status  12/05/2022 84.8 80.0 - 100.0 fL Final   No results found for: PLTCOUNTKUC, LABPLAT, POCPLA RDW  Date Value Ref Range Status  12/05/2022 13.5 11.0 - 15.0 % Final         Passed - Cr in normal range and within 360 days    Creat  Date Value Ref Range Status  12/05/2022 0.78 0.60 - 1.00 mg/dL Final   Creatinine, Urine  Date Value Ref Range Status  12/05/2022 57 20 - 275 mg/dL Final         Passed - HBA1C is between 0 and 7.9 and within 180 days    Hgb A1c MFr Bld  Date Value Ref Range Status  12/05/2022 7.5 (H) <5.7 % of total Hgb Final    Comment:    For someone without  known diabetes, a hemoglobin A1c value of 6.5% or greater indicates that they may have  diabetes and this should be confirmed with a follow-up  test. . For someone with known diabetes, a value <7% indicates  that their diabetes is well controlled and a value  greater than or equal to 7% indicates suboptimal  control. A1c targets should be individualized based on  duration of diabetes, age, comorbid conditions, and  other considerations. . Currently, no consensus exists regarding use of hemoglobin A1c for diagnosis of diabetes for children. .          Passed - eGFR in normal range and within 360 days    GFR, Est African American  Date Value Ref Range Status  06/10/2018 91 > OR = 60 mL/min/1.30m2 Final   GFR, Est Non African American  Date Value Ref Range Status  06/10/2018 79 > OR = 60 mL/min/1.84m2 Final   eGFR  Date Value Ref Range Status  12/05/2022 82 > OR = 60 mL/min/1.36m2 Final         Passed - Valid encounter within last 6 months  Recent Outpatient Visits           2 months ago Type 2 diabetes mellitus with hyperglycemia, without long-term current use of insulin  [facility])   [place] [facility] Delles, Sharyle A, RPH-CPP   2 months ago Type 2 diabetes mellitus with hyperglycemia, without long-term current use of insulin  [facility])   [place] [facility] [place], [place] W, NP   5 months ago Chronic bilateral low back pain without sciatica   [place] [facility] [place], [place] W, NP   5 months ago Type 2 diabetes mellitus with hyperglycemia, without long-term current use of insulin  [facility])   [place] [facility] Delles, Sharyle A, RPH-CPP   8 months ago Type 2 diabetes mellitus with hyperglycemia, without long-term current use of insulin  [facility])   [place] [facility] Delles, Sharyle LABOR, RPH-CPP       Future Appointments             In 3 months Baity, Angeline ORN, NP [place]  [facility], [facility]

## 2023-03-10 ENCOUNTER — Other Ambulatory Visit: Payer: Self-pay | Admitting: Pharmacist

## 2023-03-10 DIAGNOSIS — E1165 Type 2 diabetes mellitus with hyperglycemia: Secondary | ICD-10-CM

## 2023-03-10 NOTE — Progress Notes (Signed)
   03/10/2023  Patient ID: Summer Young, female   DOB: 1950/06/16, 73 y.o.   MRN: [id number]  Receive a message from patient requesting a call back regarding medication assistance. Return call to patient.  Per messages from CPhT Pattricia Boss, patient is approved for enrollment in [employer] patient assistance re-enrollment through 02/17/2024.  Reports that she contacted office today regarding her Ozempic and Evaristo Bury, but office had not yet received this for her.  Outreach to [employer] patient assistance program today on behalf of patient. Speak with representative who advises patient's prescription is preparing to ship to office, but has not shipped as of today. Advise patient that if her medication does not arrive by 03/12/2023, she may contact Novo Nordisk at [phone]  to let them know that it did not arrive within the 14 business days of approval and request a voucher to use to pick up a free 30 day supply from her local pharmacy.  - Follow up with patient to provide this update  Follow Up Plan: Clinical Pharmacist will follow up with patient by telephone on 03/23/2023 at 8:30 AM   Estelle Grumbles, PharmD, [facility] [phone]

## 2023-03-10 NOTE — Patient Instructions (Signed)
 Goals Addressed             This Visit's Progress    Pharmacy Goals       Our goal A1c is less than 7%. This corresponds with fasting sugars less than 130 and 2 hour after meal sugars less than 180. Please check your blood sugars and keep log of results  Our goal bad cholesterol, or LDL, is less than 70 . This is why it is important to continue taking your atorvastatin  Check your blood pressure periodically, and any time you have concerning symptoms like headache, chest pain, dizziness, shortness of breath, or vision changes.   Our goal is less than 130/80.  To appropriately check your blood pressure, make sure you do the following:  1) Avoid caffeine, exercise, or tobacco products for 30 minutes before checking. Empty your bladder. 2) Sit with your back supported in a flat-backed chair. Rest your arm on something flat (arm of the chair, table, etc). 3) Sit still with your feet flat on the floor, resting, for at least 5 minutes.  4) Check your blood pressure. Take 1-2 readings.  5) Write down these readings and bring with you to any provider appointments.  Bring your home blood pressure machine with you to a provider's office for accuracy comparison at least once a year.   Make sure you take your blood pressure medications before you come to any office visit, even if you were asked to fast for labs.  Feel free to call me with any questions or concerns. I look forward to our next call!  Estelle Grumbles, PharmD, [facility] Clinical Pharmacist [facility] [phone]

## 2023-03-16 ENCOUNTER — Encounter: Payer: Self-pay | Admitting: Internal Medicine

## 2023-03-16 ENCOUNTER — Ambulatory Visit (INDEPENDENT_AMBULATORY_CARE_PROVIDER_SITE_OTHER): Payer: Medicare HMO | Admitting: Internal Medicine

## 2023-03-16 VITALS — BP 130/72 | Ht 61.0 in | Wt 162.4 lb

## 2023-03-16 DIAGNOSIS — M5412 Radiculopathy, cervical region: Secondary | ICD-10-CM | POA: Diagnosis not present

## 2023-03-16 MED ORDER — NAPROXEN 500 MG PO TABS: 500.0000 mg | ORAL_TABLET | Freq: Two times a day (BID) | ORAL | 0 refills | Status: DC

## 2023-03-16 NOTE — Progress Notes (Signed)
Subjective:    Patient ID: Summer Young, female    DOB: 12-21-1950, 73 y.o.   MRN: [id number]  HPI  Discussed the use of AI scribe software for clinical note transcription with the patient, who gave verbal consent to proceed.   The patient, with a history of back pain with sciatica, presents with worsening right shoulder pain and neck stiffness. The pain, described as constant and deep within the bone, has been ongoing for several months and has recently intensified to the point of interfering with daily activities such as showering and dressing. The patient also reports experiencing spasms and a sensation of her hand 'falling asleep.'  Previously, the patient had been referred to a neurosurgery clinic for back pain, where an x-ray of the shoulder and cervical spine was taken. The x-ray results are not available for review in care everywhere but I can see that they were done 11/26/2022. The patient underwent physical therapy, which initially seemed to improve the condition. However, after a break from therapy due to a trip, the patient's symptoms worsened.  She takes gabapentin for her sciatica but does not feel like this has been helpful with her right side neck/right shoulder pain and paresthesia.  She has not tried anything additional OTC.    Review of Systems     Past Medical History:  Diagnosis Date   Allergy    Chronic pain of both knees    Diabetes mellitus without complication (HCC)    Hyperlipidemia    Hypertension     Current Outpatient Medications  Medication Sig Dispense Refill   acetaminophen (TYLENOL) 650 MG CR tablet Take 650 mg by mouth 2 (two) times daily as needed for pain.     alendronate (FOSAMAX) 70 MG tablet TAKE 1 TABLET BY MOUTH ONCE A WEEK WITH  A  FULL  GLASS  OF  WATER  ON  AN  EMPTY  STOMACH 12 tablet 0   atorvastatin (LIPITOR) 40 MG tablet Take 1 tablet by mouth once daily 90 tablet 3   blood glucose meter kit and supplies Dispense based on patient and  insurance preference. Use up to four times daily as directed. (FOR ICD-10 E10.9, E11.9). 1 each 0   Blood Glucose Monitoring Suppl (ONETOUCH VERIO) w/Device KIT Use to check blood sugar as directed.  DX: E11.65 1 kit 0   cetirizine (ZYRTEC) 10 MG tablet Take 1 tablet (10 mg total) by mouth daily. (Patient taking differently: Take 10 mg by mouth daily as needed.) 30 tablet 0   Cholecalciferol (VITAMIN D3) 50 MCG (2000 UT) capsule Take 1,000 Units by mouth daily.      Continuous Glucose Sensor (FREESTYLE LIBRE 3 SENSOR) MISC PLACE 1 SENSOR ON THE SKIN EVERY 14 DAYS 6 each 0   Diclofenac Sodium 3 % GEL Apply 2-3 gm topically, 4 times daily.     EPINEPHrine 0.3 mg/0.3 mL IJ SOAJ injection Inject 0.3 mg into the muscle as needed for anaphylaxis. 1 each 1   fluticasone (FLONASE) 50 MCG/ACT nasal spray Place 2 sprays into both nostrils daily. Use for 4-6 weeks then stop and use seasonally or as needed. 16 g 0   gabapentin (NEURONTIN) 100 MG capsule Take 100 mg by mouth at bedtime.     insulin degludec (TRESIBA FLEXTOUCH) 100 UNIT/ML FlexTouch Pen Inject 26 Units into the skin daily. (Patient taking differently: Inject 19 Units into the skin daily.) 15 mL 0   metFORMIN (GLUCOPHAGE-XR) 500 MG 24 hr tablet TAKE 3 TABLETS BY  MOUTH ONCE DAILY WITH BREAKFAST 270 tablet 0   omeprazole (PRILOSEC) 40 MG capsule TAKE 1 CAPSULE BY MOUTH ONCE DAILY BEFORE BREAKFAST 90 capsule 0   OneTouch Delica Lancets 33G MISC Use to check blood sugar as directed.  DX: E11.65 100 each 1   ONETOUCH ULTRA test strip Check blood sugar 1 x daily as directed 100 each 12   Semaglutide, 1 MG/DOSE, (OZEMPIC, 1 MG/DOSE,) 2 MG/1.5ML SOPN INJECT 1 MG SUBCUTANEOUSLY ONCE A WEEK 4 mL 0   vitamin B-12 (CYANOCOBALAMIN) 1000 MCG tablet Take 1,000 mcg by mouth daily.      No current facility-administered medications for this visit.    Allergies  Allergen Reactions   Aspirin Hives    Other reaction(s): Other (See Comments) Other Reaction: Not  Assessed    Insulin Detemir Hives and Rash    Errythema, edema, heat at site of injection did not improve after 2 weeks of use.     Shellfish Allergy Anaphylaxis   Dapagliflozin Other (See Comments)    Yeast infections  Yeast infection  Farxiga   Influenza Vaccines Other (See Comments)    Forxiga   Other     Other reaction(s): Other (See Comments) Uncoded Allergy. Allergen: Shellfish, Other Reaction: Not Assessed    Flu vaccine given 1998 caused anaphylaxis    Family History  Problem Relation Age of Onset   Diabetes Mother    Diabetes Father    Stroke Father    Diabetes Sister    Heart attack Other    Diabetes Sister    Diabetes Sister    Diabetes Sister     Social History   Socioeconomic History   Marital status: Married    Spouse name: Reynoso,Eduardo   Number of children: Not on file   Years of education: Not on file   Highest education level: High school graduate  Occupational History   Occupation: housekeeper    Comment: self-employed   Occupation: Self- Employed  Tobacco Use   Smoking status: Never   Smokeless tobacco: Never  Vaping Use   Vaping status: Never Used  Substance and Sexual Activity   Alcohol use: Yes    Alcohol/week: 0.0 - 1.0 standard drinks of alcohol    Comment: occasional   Drug use: Never   Sexual activity: Yes    Birth control/protection: None  Other Topics Concern   Not on file  Social History Narrative   Not on file   Social Drivers of Health   Financial Resource Strain: Medium Risk (08/28/2022)   Overall Financial Resource Strain (CARDIA)    Difficulty of Paying Living Expenses: Somewhat hard  Food Insecurity: No Food Insecurity (08/28/2022)   Hunger Vital Sign    Worried About Running Out of Food in the Last Year: Never true    Ran Out of Food in the Last Year: Never true  Transportation Needs: No Transportation Needs (08/28/2022)   PRAPARE - Administrator, Civil Service (Medical): No    Lack of  Transportation (Non-Medical): No  Physical Activity: Inactive (08/28/2022)   Exercise Vital Sign    Days of Exercise per Week: 0 days    Minutes of Exercise per Session: 0 min  Stress: Stress Concern Present (08/28/2022)   [employer] of Occupational Health - Occupational Stress Questionnaire    Feeling of Stress : Rather much  Social Connections: Moderately Isolated (08/28/2022)   Social Connection and Isolation Panel [NHANES]    Frequency of Communication with Friends and Family: More  than three times a week    Frequency of Social Gatherings with Friends and Family: Not on file    Attends Religious Services: More than 4 times per year    Active Member of [employer] or Organizations: No    Attends Banker Meetings: Never    Marital Status: Separated  Intimate Partner Violence: Not At Risk (08/28/2022)   Humiliation, Afraid, Rape, and Kick questionnaire    Fear of Current or Ex-Partner: No    Emotionally Abused: No    Physically Abused: No    Sexually Abused: No     Constitutional: Denies fever, malaise, fatigue, headache or abrupt weight changes.  Respiratory: Denies difficulty breathing, shortness of breath, cough or sputum production.   Cardiovascular: Denies chest pain, chest tightness, palpitations or swelling in the hands or feet.  Musculoskeletal: Patient reports right shoulder pain, right side neck pain, decrease in range of motion.  Denies difficulty with gait, muscle pain or joint swelling.  Skin: Denies redness, rashes, lesions or ulcercations.  Neurological: Patient reports paresthesia of right upper extremity.  Denies dizziness, difficulty with memory, difficulty with speech or problems with balance and coordination.    No other specific complaints in a complete review of systems (except as listed in HPI above).  Objective:   Physical Exam  BP 130/72 (BP Location: Left Arm, Patient Position: Sitting, Cuff Size: Normal)   Ht 5\' 1"  (1.549 m)   Wt 162 lb  6.4 oz (73.7 kg)   BMI 30.69 kg/m    Wt Readings from Last 3 Encounters:  12/05/22 159 lb (72.1 kg)  11/11/22 158 lb (71.7 kg)  10/03/22 158 lb (71.7 kg)    General: Appears her stated age, obese, in NAD. HEENT: Head: normal shape and size; Eyes: sclera white, no icterus, conjunctiva pink, PERRLA and EOMs intact;  Cardiovascular: Normal rate and rhythm.  Pulmonary/Chest: Normal effort and positive vesicular breath sounds. No respiratory distress. No wheezes, rales or ronchi noted.  Musculoskeletal: Decreased flexion, extension and rotation to the left of the cervical spine.  Normal rotation to the right of the cervical spine.  Pain with palpation over C6/C7.  Shoulder shrug equal.  She is able to externally rotate her shoulder but slow and with some pain.  Decreased internal rotation of the right shoulder.  No pain with palpation of the right shoulder.  She reports she is unable to do a drop can test.  Strength 5/5 BUE.  Handgrips equal.  Shoulder shrug is equal.  No difficulty with gait.  Neurological: Alert and oriented. Coordination normal of the fingers of the right hand.    BMET    Component Value Date/Time   NA 139 12/05/2022 1010   K 5.1 12/05/2022 1010   CL 105 12/05/2022 1010   CO2 24 12/05/2022 1010   GLUCOSE 126 (H) 12/05/2022 1010   BUN 12 12/05/2022 1010   CREATININE 0.78 12/05/2022 1010   CALCIUM 9.3 12/05/2022 1010   GFRNONAA 79 06/10/2018 0824   GFRAA 91 06/10/2018 0824    Lipid Panel     Component Value Date/Time   CHOL 131 12/05/2022 1010   TRIG 105 12/05/2022 1010   HDL 54 12/05/2022 1010   CHOLHDL 2.4 12/05/2022 1010   LDLCALC 58 12/05/2022 1010    CBC    Component Value Date/Time   WBC 15.2 (H) 12/05/2022 1010   RBC 5.07 12/05/2022 1010   HGB 13.7 12/05/2022 1010   HCT 43.0 12/05/2022 1010   PLT 387 12/05/2022  1010   MCV 84.8 12/05/2022 1010   MCH 27.0 12/05/2022 1010   MCHC 31.9 (L) 12/05/2022 1010   RDW 13.5 12/05/2022 1010   LYMPHSABS  2,967 04/12/2019 0818   EOSABS 95 04/12/2019 0818   BASOSABS 34 04/12/2019 0818    Hgb A1C Lab Results  Component Value Date   HGBA1C 7.5 (H) 12/05/2022            Assessment & Plan:   Assessment and Plan    Right Shoulder Pain/Right Side Neck Pain: Worsening pain and limited range of motion. Previous x-ray unavailable for review in epic.  Pain is constant with spasms and associated with numbness and tingling in the hand. -Refer back to [facility] neurosurgery for further evaluation and possible MRI of the cervical spine and shoulder. -Start Naproxen 500mg  BID with meals for 7 days to reduce inflammation. -Consider restarting physical therapy if this seemed to help with previous     RTC in 3 months for your annual exam Nicki Reaper, NP

## 2023-03-16 NOTE — Patient Instructions (Signed)
Cervical Radiculopathy  Cervical radiculopathy means that a nerve in the neck (a cervical nerve) is pinched or bruised. This can happen because of an injury to the cervical spine (vertebrae) in the neck, or as a normal part of getting older. This condition can cause pain or loss of feeling (numbness) that runs from your neck all the way down to your arm and fingers. Often, this condition gets better with rest. Treatment may be needed if the condition does not get better. What are the causes? A neck injury. A bulging disk in your spine. Sudden muscle tightening (muscle spasms). Tight muscles in your neck due to overuse. Arthritis. Breakdown in the bones and joints of the spine (spondylosis) due to getting older. Bone spurs that form near the nerves in the neck. What are the signs or symptoms? Pain. The pain may: Run from the neck to the arm and hand. Be very bad or irritating. Get worse when you move your neck. Loss of feeling or tingling in your arm or hand. Weakness in your arm or hand, in very bad cases. How is this treated? In many cases, treatment is not needed for this condition. With rest, the condition often gets better over time. If treatment is needed, options may include: Wearing a soft neck collar (cervical collar) for short periods of time. Doing exercises (physical therapy) to strengthen your neck muscles. Taking medicines. Having shots (injections) in your spine, in very bad cases. Having surgery. This may be needed if other treatments do not help. The type of surgery that is used will depend on the cause of your condition. Follow these instructions at home: If you have a soft neck collar: Wear it as told by your doctor. Take it off only as told by your doctor. Ask your doctor if you can take the collar off for cleaning and bathing. If you are allowed to take the collar off for cleaning or bathing: Follow instructions from your doctor about how to take off the collar  safely. Clean the collar by wiping it with mild soap and water and drying it completely. Take out any removable pads in the collar every 1-2 days. Wash them by hand with soap and water. Let them air-dry completely before you put them back in the collar. Check your skin under the collar for redness or sores. If you see any, tell your doctor. Managing pain     Take over-the-counter and prescription medicines only as told by your doctor. If told, put ice on the painful area. To do this: If you have a soft neck collar, take if off as told by your doctor. Put ice in a plastic bag. Place a towel between your skin and the bag. Leave the ice on for 20 minutes, 2-3 times a day. Take off the ice if your skin turns bright red. This is very important. If you cannot feel pain, heat, or cold, you have a greater risk of damage to the area. If using ice does not help, you can try using heat. Use the heat source that your doctor recommends, such as a moist heat pack or a heating pad. Place a towel between your skin and the heat source. Leave the heat on for 20-30 minutes. Take off the heat if your skin turns bright red. This is very important. If you cannot feel pain, heat, or cold, you have a greater risk of getting burned. You may try a gentle neck and shoulder rub (massage). Activity Rest as needed. Return  to your normal activities when your doctor says that it is safe. Do exercises as told by your doctor or physical therapist. You may have to avoid lifting. Ask your doctor how much you can safely lift. General instructions Use a flat pillow when you sleep. Do not drive while wearing a soft neck collar. If you do not have a soft neck collar, ask your doctor if it is safe to drive while your neck heals. Ask your doctor if you should avoid driving or using machines while you are taking your medicine. Do not smoke or use any products that contain nicotine or tobacco. If you need help quitting, ask your  doctor. Keep all follow-up visits. Contact a doctor if: Your condition does not get better with treatment. Get help right away if: Your pain gets worse and medicine does not help. You lose feeling or feel weak in your hand, arm, face, or leg. You have a high fever. Your neck is stiff. You cannot control when you poop or pee (have incontinence). You have trouble with walking, balance, or talking. Summary Cervical radiculopathy means that a nerve in the neck is pinched or bruised. A nerve can get pinched from a bulging disk, arthritis, an injury to the neck, or other causes. Symptoms include pain, tingling, or loss of feeling that goes from the neck to the arm or hand. Weakness in your arm or hand can happen in very bad cases. Treatment may include resting, wearing a soft neck collar, and doing exercises. You might need to take medicines for pain. In very bad cases, shots or surgery may be needed. This information is not intended to replace advice given to you by your health care provider. Make sure you discuss any questions you have with your health care provider. Document Revised: 08/09/2020 Document Reviewed: 08/09/2020 Elsevier Patient Education  2024 [employer].

## 2023-03-23 ENCOUNTER — Other Ambulatory Visit: Payer: Medicare HMO | Admitting: Pharmacist

## 2023-03-23 DIAGNOSIS — E1165 Type 2 diabetes mellitus with hyperglycemia: Secondary | ICD-10-CM

## 2023-03-23 DIAGNOSIS — Z794 Long term (current) use of insulin: Secondary | ICD-10-CM

## 2023-03-23 MED ORDER — FREESTYLE LIBRE 3 PLUS SENSOR MISC
12 refills | Status: DC
Start: 2023-03-23 — End: 2023-08-31

## 2023-03-23 MED ORDER — OZEMPIC (1 MG/DOSE) 4 MG/3ML ~~LOC~~ SOPN: 1.0000 mg | PEN_INJECTOR | SUBCUTANEOUS | 0 refills | Status: DC

## 2023-03-23 NOTE — Progress Notes (Signed)
03/23/2023 Name: Summer Young MRN: [id number] DOB: 05/14/1950  Chief Complaint  Patient presents with   Medication Management   Medication Assistance    Summer Young is a 73 y.o. year old female who presented for a telephone visit.   They were referred to the pharmacist by their PCP for assistance in managing diabetes, hypertension, hyperlipidemia, and medication access.      Subjective:   Care Team: Primary Care Provider: Lorre Munroe, NP ; Next Scheduled Visit: 06/10/2023   Medication Access/Adherence  Current Pharmacy:  [facility] [address] [place] - [address] [place] [address] Phone: [phone] Fax: [phone]   Patient reports affordability concerns with their medications: No  Patient reports access/transportation concerns to their pharmacy: No  Patient reports adherence concerns with their medications:  No     Denies using weekly pillbox, but reports has her own strategy (keeping track of when takes her medications)     Type 2 Diabetes:   Current medications:  Tresiba 19 units once daily Metformin ER 500 mg - 3 tablets daily with breakfast Ozempic 1 mg once weekly   Patient using Freestyle Libre 3 continuous blood glucose monitor (CGM) Using her iPhone in place of reader devices Review results from Freestyle LibreView:   Date of Download: 03/23/23 % Time CGM is active: 83% Average Glucose: 143 mg/dL Glucose Management Indicator: 6.7%  Glucose Variability: 30.7% (goal <36%) Time in Goal:  - Time in range 70-180: 81% - Time above range: 19% - Time below range: 0%    Attributes recent elevation in A1C/blood sugar to change in eating habits and activity. Reports recently has been eating larger portions of carbohydrates, including rice. Attributes change to recent stress   Denies hypoglycemia Note patient carries glucose tablets with her    Current physical activity: walking her dog less recently due to cold,  but planning to get back to walking more   Statin: atorvastatin 40 mg daily     Current medication access support: Enrolled in [place] and Ozempic medication assistance from [employer] through 02/17/2024 - Reports has ~2 week supply of Ozempic remaining; denies having yet called Novo Nordisk     Objective:  Lab Results  Component Value Date   HGBA1C 7.5 (H) 12/05/2022    Lab Results  Component Value Date   CREATININE 0.78 12/05/2022   BUN 12 12/05/2022   NA 139 12/05/2022   K 5.1 12/05/2022   CL 105 12/05/2022   CO2 24 12/05/2022    Lab Results  Component Value Date   CHOL 131 12/05/2022   HDL 54 12/05/2022   LDLCALC 58 12/05/2022   TRIG 105 12/05/2022   CHOLHDL 2.4 12/05/2022    Medications Reviewed Today     Reviewed by Manuela Neptune, RPH-CPP (Pharmacist) on 03/23/23 at 0850  Med List Status: <None>   Medication Order Taking? Sig Documenting Provider Last Dose Status Informant  acetaminophen (TYLENOL) 650 MG CR tablet [id number]  Take 650 mg by mouth 2 (two) times daily as needed for pain. [provider]  Active   alendronate (FOSAMAX) 70 MG tablet [id number]  TAKE 1 TABLET BY MOUTH ONCE A WEEK WITH  A  FULL  GLASS  OF  WATER  ON  AN  EMPTY  STOMACH Baity, Salvadore Oxford, NP  Active   atorvastatin (LIPITOR) 40 MG tablet [id number]  Take 1 tablet by mouth once daily Lorre Munroe, NP  Active   blood glucose meter kit  and supplies [id number]  Dispense based on patient and insurance preference. Use up to four times daily as directed. (FOR ICD-10 E10.9, E11.9). Tarri Fuller, FNP  Active   Blood Glucose Monitoring Suppl [facility] VERIO) w/Device KIT [id number]  Use to check blood sugar as directed.  DX: E11.65 Lorre Munroe, NP  Active   cetirizine (ZYRTEC) 10 MG tablet [id number]  Take 1 tablet (10 mg total) by mouth daily.  Patient taking differently: Take 10 mg by mouth daily as needed.   Lorre Munroe, NP  Active   Cholecalciferol (VITAMIN D3) 50 MCG  (2000 UT) capsule [id number]  Take 1,000 Units by mouth daily.  [provider]  Active   Continuous Glucose Sensor (FREESTYLE LIBRE 3 SENSOR) [place] [id number]  PLACE 1 SENSOR ON THE SKIN EVERY 14 DAYS Lorre Munroe, NP  Active   Diclofenac Sodium 3 % GEL [id number]  Apply 2-3 gm topically, 4 times daily. [provider]  Active   EPINEPHrine 0.3 mg/0.3 mL IJ SOAJ injection [id number]  Inject 0.3 mg into the muscle as needed for anaphylaxis. Lorre Munroe, NP  Active   fluticasone [facility]) 50 MCG/ACT nasal spray [id number]  Place 2 sprays into both nostrils daily. Use for 4-6 weeks then stop and use seasonally or as needed. Lorre Munroe, NP  Active   gabapentin (NEURONTIN) 100 MG capsule [id number]  Take 100 mg by mouth at bedtime. [provider]  Active   insulin degludec (TRESIBA FLEXTOUCH) 100 UNIT/ML FlexTouch Pen [id number] Yes Inject 26 Units into the skin daily.  Patient taking differently: Inject 19 Units into the skin daily.   Lorre Munroe, NP Taking Active   metFORMIN (GLUCOPHAGE-XR) 500 MG 24 hr tablet [id number] Yes TAKE 3 TABLETS BY MOUTH ONCE DAILY WITH BREAKFAST Baity, Salvadore Oxford, NP Taking Active   naproxen (NAPROSYN) 500 MG tablet [id number]  Take 1 tablet (500 mg total) by mouth 2 (two) times daily with a meal. Lorre Munroe, NP  Active   omeprazole (PRILOSEC) 40 MG capsule [id number]  TAKE 1 CAPSULE BY MOUTH ONCE DAILY BEFORE BREAKFAST Baity, Salvadore Oxford, NP  Active            Med Note Perlie Gold Aug 28, 2022  3:01 PM) Takes PRN   Dola [place] Lancets 33G MISC [id number]  Use to check blood sugar as directed.  DX: E11.65 Lorre Munroe, NP  Active   [facility] ULTRA test strip [id number]  Check blood sugar 1 x daily as directed Smitty Cords, DO  Active   Semaglutide, 1 MG/DOSE, (OZEMPIC, 1 MG/DOSE,) 2 MG/1.5ML SOPN [id number] Yes INJECT 1 MG SUBCUTANEOUSLY ONCE A WEEK Baity, Salvadore Oxford, NP Taking Active   vitamin B-12  (CYANOCOBALAMIN) 1000 MCG tablet [id number]  Take 1,000 mcg by mouth daily.  [provider]  Active               Assessment/Plan:   Diabetes: - Encourage patient to get back to having regular well-balanced meals and snacks, while controlling carbohydrate portion sizes - Have counseled patient on symptoms of/how to manage low blood sugar - Patient to use CGM as feedback on dietary choices/habits to aid with glucose control. Patient to follow up with office if needed for readings outside of established parameters or symptoms - Advise patient to contact Novo Nordisk at [phone]  to let them know that it did not arrive within the 14 business days of approval and request a  voucher to use to pick up a free 1 month supply from her local pharmacy.  - Advised patient per [employer] Rep, Freestyle Libre 3 will be discontinued by September; switching to [employer] 3 Plus. Send prescription for Freestyle Libre 3 Plus CGM to pharmacy for patient (per protocol)             Patient aware that Freestyle Libre 3 Plus sensors last 15 days  Follow Up Plan: Clinic Pharmacist will follow up with patient by telephone on 06/10/2023 at 10:00 AM    Estelle Grumbles, PharmD, [facility] Clinical Pharmacist [facility] [phone]

## 2023-03-23 NOTE — Patient Instructions (Signed)
Goals Addressed             This Visit's Progress    Pharmacy Goals       Our goal A1c is less than 7%. This corresponds with fasting sugars less than 130 and 2 hour after meal sugars less than 180. Please check your blood sugars and keep log of results  Our goal bad cholesterol, or LDL, is less than 70 . This is why it is important to continue taking your atorvastatin   Feel free to call me with any questions or concerns. I look forward to our next call!  Estelle Grumbles, PharmD, [facility] Clinical Pharmacist [facility] [phone]

## 2023-03-23 NOTE — Progress Notes (Signed)
Receive a call back from patient. She received the voucher from [employer] to pick up 1 month supply of Ozempic from her local pharmacy. Will collaborate with PCP to request provider send 1 month supply prescription to pharmacy for patient.

## 2023-03-24 ENCOUNTER — Ambulatory Visit
Admission: RE | Admit: 2023-03-24 | Discharge: 2023-03-24 | Disposition: A | Discharge status: Home / Self Care | Payer: Medicare HMO | Source: Ambulatory Visit | Attending: Family Medicine | Admitting: Family Medicine

## 2023-03-24 ENCOUNTER — Other Ambulatory Visit: Payer: Self-pay | Admitting: Family Medicine

## 2023-03-24 DIAGNOSIS — M19011 Primary osteoarthritis, right shoulder: Secondary | ICD-10-CM | POA: Diagnosis not present

## 2023-03-24 DIAGNOSIS — R29898 Other symptoms and signs involving the musculoskeletal system: Secondary | ICD-10-CM | POA: Diagnosis not present

## 2023-03-24 DIAGNOSIS — M7581 Other shoulder lesions, right shoulder: Secondary | ICD-10-CM | POA: Diagnosis not present

## 2023-03-24 DIAGNOSIS — G959 Disease of spinal cord, unspecified: Secondary | ICD-10-CM | POA: Diagnosis not present

## 2023-03-24 DIAGNOSIS — M47813 Spondylosis without myelopathy or radiculopathy, cervicothoracic region: Secondary | ICD-10-CM | POA: Diagnosis not present

## 2023-03-24 DIAGNOSIS — M50222 Other cervical disc displacement at C5-C6 level: Secondary | ICD-10-CM | POA: Diagnosis not present

## 2023-03-24 DIAGNOSIS — M4802 Spinal stenosis, cervical region: Secondary | ICD-10-CM | POA: Diagnosis not present

## 2023-03-24 DIAGNOSIS — M47812 Spondylosis without myelopathy or radiculopathy, cervical region: Secondary | ICD-10-CM | POA: Diagnosis not present

## 2023-03-27 DIAGNOSIS — S43431A Superior glenoid labrum lesion of right shoulder, initial encounter: Secondary | ICD-10-CM | POA: Diagnosis not present

## 2023-03-27 DIAGNOSIS — M7581 Other shoulder lesions, right shoulder: Secondary | ICD-10-CM | POA: Diagnosis not present

## 2023-03-27 DIAGNOSIS — E785 Hyperlipidemia, unspecified: Secondary | ICD-10-CM | POA: Diagnosis not present

## 2023-03-27 DIAGNOSIS — E1169 Type 2 diabetes mellitus with other specified complication: Secondary | ICD-10-CM | POA: Diagnosis not present

## 2023-03-27 DIAGNOSIS — M7521 Bicipital tendinitis, right shoulder: Secondary | ICD-10-CM | POA: Diagnosis not present

## 2023-03-31 DIAGNOSIS — S43431D Superior glenoid labrum lesion of right shoulder, subsequent encounter: Secondary | ICD-10-CM | POA: Diagnosis not present

## 2023-03-31 DIAGNOSIS — M7581 Other shoulder lesions, right shoulder: Secondary | ICD-10-CM | POA: Diagnosis not present

## 2023-03-31 DIAGNOSIS — M25511 Pain in right shoulder: Secondary | ICD-10-CM | POA: Diagnosis not present

## 2023-03-31 DIAGNOSIS — M7521 Bicipital tendinitis, right shoulder: Secondary | ICD-10-CM | POA: Diagnosis not present

## 2023-04-10 DIAGNOSIS — M7521 Bicipital tendinitis, right shoulder: Secondary | ICD-10-CM | POA: Diagnosis not present

## 2023-04-10 DIAGNOSIS — M7581 Other shoulder lesions, right shoulder: Secondary | ICD-10-CM | POA: Diagnosis not present

## 2023-04-10 DIAGNOSIS — M25511 Pain in right shoulder: Secondary | ICD-10-CM | POA: Diagnosis not present

## 2023-04-10 DIAGNOSIS — S43431D Superior glenoid labrum lesion of right shoulder, subsequent encounter: Secondary | ICD-10-CM | POA: Diagnosis not present

## 2023-04-17 DIAGNOSIS — S43431D Superior glenoid labrum lesion of right shoulder, subsequent encounter: Secondary | ICD-10-CM | POA: Diagnosis not present

## 2023-04-17 DIAGNOSIS — M25511 Pain in right shoulder: Secondary | ICD-10-CM | POA: Diagnosis not present

## 2023-04-17 DIAGNOSIS — M7521 Bicipital tendinitis, right shoulder: Secondary | ICD-10-CM | POA: Diagnosis not present

## 2023-04-17 DIAGNOSIS — M7581 Other shoulder lesions, right shoulder: Secondary | ICD-10-CM | POA: Diagnosis not present

## 2023-04-28 ENCOUNTER — Other Ambulatory Visit: Payer: Self-pay | Admitting: Internal Medicine

## 2023-04-28 DIAGNOSIS — E1165 Type 2 diabetes mellitus with hyperglycemia: Secondary | ICD-10-CM

## 2023-04-29 NOTE — Telephone Encounter (Signed)
 Labs in date.  Requested Prescriptions  Pending Prescriptions Disp Refills   OZEMPIC, 1 MG/DOSE, 4 MG/3ML SOPN [Pharmacy Med Name: Ozempic (1 MG/DOSE) 4 MG/3ML Subcutaneous Solution Pen-injector] 3 mL 0    Sig: INJECT 1MG  INTO THE SKIN ONCE WEEKLY     Endocrinology:  Diabetes - GLP-1 Receptor Agonists - semaglutide Failed - 04/29/2023 11:41 AM      Failed - HBA1C in normal range and within 180 days    Hgb A1c MFr Bld  Date Value Ref Range Status  12/05/2022 7.5 (H) <5.7 % of total Hgb Final    Comment:    For someone without known diabetes, a hemoglobin A1c value of 6.5% or greater indicates that they may have  diabetes and this should be confirmed with a follow-up  test. . For someone with known diabetes, a value <7% indicates  that their diabetes is well controlled and a value  greater than or equal to 7% indicates suboptimal  control. A1c targets should be individualized based on  duration of diabetes, age, comorbid conditions, and  other considerations. . Currently, no consensus exists regarding use of hemoglobin A1c for diagnosis of diabetes for children. .          Passed - Cr in normal range and within 360 days    Creat  Date Value Ref Range Status  12/05/2022 0.78 0.60 - 1.00 mg/dL Final   Creatinine, Urine  Date Value Ref Range Status  12/05/2022 57 20 - 275 mg/dL Final         Passed - Valid encounter within last 6 months    Recent Outpatient Visits           1 month ago Cervical radiculitis   [facility] [place], [place] W, NP   4 months ago Type 2 diabetes mellitus with hyperglycemia, without long-term current use of insulin [facility])   [place] [facility] Delles, Gentry Fitz A, RPH-CPP   4 months ago Type 2 diabetes mellitus with hyperglycemia, without long-term current use of insulin [facility])   [place] [facility] [place], [place] W, NP   6 months ago Chronic bilateral low back pain without sciatica    [place] [facility] [place], [place] W, NP   7 months ago Type 2 diabetes mellitus with hyperglycemia, without long-term current use of insulin [facility])    [facility] Delles, Gentry Fitz A, RPH-CPP

## 2023-05-20 ENCOUNTER — Other Ambulatory Visit: Payer: Self-pay | Admitting: Internal Medicine

## 2023-05-20 DIAGNOSIS — M810 Age-related osteoporosis without current pathological fracture: Secondary | ICD-10-CM

## 2023-05-21 NOTE — Telephone Encounter (Signed)
 Requested medications are due for refill today.  yes  Requested medications are on the active medications list. yes  Last refill. 02/03/2023 #12 0 rf  Future visit scheduled.   no  Notes to clinic.  Missing labs.    Requested Prescriptions  Pending Prescriptions Disp Refills   alendronate (FOSAMAX) 70 MG tablet [Pharmacy Med Name: Alendronate Sodium 70 MG Oral Tablet] 12 tablet 0    Sig: TAKE 1 TABLET BY MOUTH ONCE A WEEK WITH  A  FULL  GLASS  OF  WATER  ON  AN  EMPTY  STOMACH     Endocrinology:  Bisphosphonates Failed - 05/21/2023  3:14 PM      Failed - Vitamin D in normal range and within 360 days    No results found for: "EA5409WJ1", "BJ4782NF6", "VD125OH2TOT", "25OHVITD3", "25OHVITD2", "25OHVITD1", "VD25OH"       Failed - Mg Level in normal range and within 360 days    No results found for: "MG"       Failed - Phosphate in normal range and within 360 days    No results found for: "PHOS"       Failed - Valid encounter within last 12 months    Recent Outpatient Visits   None            Passed - Ca in normal range and within 360 days    Calcium  Date Value Ref Range Status  12/05/2022 9.3 8.6 - 10.4 mg/dL Final         Passed - Cr in normal range and within 360 days    Creat  Date Value Ref Range Status  12/05/2022 0.78 0.60 - 1.00 mg/dL Final   Creatinine, Urine  Date Value Ref Range Status  12/05/2022 57 20 - 275 mg/dL Final         Passed - eGFR is 30 or above and within 360 days    GFR, Est African American  Date Value Ref Range Status  06/10/2018 91 > OR = 60 mL/min/1.59m2 Final   GFR, Est Non African American  Date Value Ref Range Status  06/10/2018 79 > OR = 60 mL/min/1.42m2 Final   eGFR  Date Value Ref Range Status  12/05/2022 82 > OR = 60 mL/min/1.42m2 Final         Passed - Bone Mineral Density or Dexa Scan completed in the last 2 years

## 2023-05-29 ENCOUNTER — Other Ambulatory Visit: Payer: Self-pay | Admitting: Internal Medicine

## 2023-05-29 DIAGNOSIS — Z794 Long term (current) use of insulin: Secondary | ICD-10-CM

## 2023-05-29 NOTE — Telephone Encounter (Signed)
 Patient must keep upcoming appointment for additional refills. Requested Prescriptions  Pending Prescriptions Disp Refills   metFORMIN (GLUCOPHAGE-XR) 500 MG 24 hr tablet [Pharmacy Med Name: metFORMIN HCl ER 500 MG Oral Tablet Extended Release 24 Hour] 270 tablet 0    Sig: TAKE 3 TABLETS BY MOUTH ONCE DAILY WITH BREAKFAST     Endocrinology:  Diabetes - Biguanides Failed - 05/29/2023  5:29 PM      Failed - B12 Level in normal range and within 720 days    No results found for: "VITAMINB12"       Failed - Valid encounter within last 6 months    Recent Outpatient Visits   None            Failed - CBC within normal limits and completed in the last 12 months    WBC  Date Value Ref Range Status  12/05/2022 15.2 (H) 3.8 - 10.8 Thousand/uL Final   RBC  Date Value Ref Range Status  12/05/2022 5.07 3.80 - 5.10 Million/uL Final   Hemoglobin  Date Value Ref Range Status  12/05/2022 13.7 11.7 - 15.5 g/dL Final   HCT  Date Value Ref Range Status  12/05/2022 43.0 35.0 - 45.0 % Final   MCHC  Date Value Ref Range Status  12/05/2022 31.9 (L) 32.0 - 36.0 g/dL Final    Comment:    For adults, a slight decrease in the calculated MCHC value (in the range of 30 to 32 g/dL) is most likely not clinically significant; however, it should be interpreted with caution in correlation with other red cell parameters and the patient's clinical condition.    [facility]  Date Value Ref Range Status  12/05/2022 27.0 27.0 - 33.0 pg Final   MCV  Date Value Ref Range Status  12/05/2022 84.8 80.0 - 100.0 fL Final   No results found for: "PLTCOUNTKUC", "LABPLAT", "POCPLA" RDW  Date Value Ref Range Status  12/05/2022 13.5 11.0 - 15.0 % Final         Passed - Cr in normal range and within 360 days    Creat  Date Value Ref Range Status  12/05/2022 0.78 0.60 - 1.00 mg/dL Final   Creatinine, Urine  Date Value Ref Range Status  12/05/2022 57 20 - 275 mg/dL Final         Passed - HBA1C is between 0  and 7.9 and within 180 days    Hgb A1c MFr Bld  Date Value Ref Range Status  12/05/2022 7.5 (H) <5.7 % of total Hgb Final    Comment:    For someone without known diabetes, a hemoglobin A1c value of 6.5% or greater indicates that they may have  diabetes and this should be confirmed with a follow-up  test. . For someone with known diabetes, a value <7% indicates  that their diabetes is well controlled and a value  greater than or equal to 7% indicates suboptimal  control. A1c targets should be individualized based on  duration of diabetes, age, comorbid conditions, and  other considerations. . Currently, no consensus exists regarding use of hemoglobin A1c for diagnosis of diabetes for children. .          Passed - eGFR in normal range and within 360 days    GFR, Est African American  Date Value Ref Range Status  06/10/2018 91 > OR = 60 mL/min/1.66m2 Final   GFR, Est Non African American  Date Value Ref Range Status  06/10/2018 79 > OR = 60 mL/min/1.14m2  Final   eGFR  Date Value Ref Range Status  12/05/2022 82 > OR = 60 mL/min/1.33m2 Final          Patient must keep upcoming appointment for additional refills.

## 2023-05-30 ENCOUNTER — Other Ambulatory Visit: Payer: Self-pay | Admitting: Internal Medicine

## 2023-05-30 DIAGNOSIS — E1165 Type 2 diabetes mellitus with hyperglycemia: Secondary | ICD-10-CM

## 2023-06-01 NOTE — Telephone Encounter (Signed)
 Requested Prescriptions  Refused Prescriptions Disp Refills   metFORMIN (GLUCOPHAGE-XR) 500 MG 24 hr tablet [Pharmacy Med Name: metFORMIN HCl ER 500 MG Oral Tablet Extended Release 24 Hour] 270 tablet 0    Sig: TAKE 3 TABLETS BY MOUTH ONCE DAILY WITH BREAKFAST     Endocrinology:  Diabetes - Biguanides Failed - 06/01/2023 11:49 AM      Failed - B12 Level in normal range and within 720 days    No results found for: "VITAMINB12"       Failed - Valid encounter within last 6 months    Recent Outpatient Visits   None            Failed - CBC within normal limits and completed in the last 12 months    WBC  Date Value Ref Range Status  12/05/2022 15.2 (H) 3.8 - 10.8 Thousand/uL Final   RBC  Date Value Ref Range Status  12/05/2022 5.07 3.80 - 5.10 Million/uL Final   Hemoglobin  Date Value Ref Range Status  12/05/2022 13.7 11.7 - 15.5 g/dL Final   HCT  Date Value Ref Range Status  12/05/2022 43.0 35.0 - 45.0 % Final   MCHC  Date Value Ref Range Status  12/05/2022 31.9 (L) 32.0 - 36.0 g/dL Final    Comment:    For adults, a slight decrease in the calculated MCHC value (in the range of 30 to 32 g/dL) is most likely not clinically significant; however, it should be interpreted with caution in correlation with other red cell parameters and the patient's clinical condition.    [facility]  Date Value Ref Range Status  12/05/2022 27.0 27.0 - 33.0 pg Final   MCV  Date Value Ref Range Status  12/05/2022 84.8 80.0 - 100.0 fL Final   No results found for: "PLTCOUNTKUC", "LABPLAT", "POCPLA" RDW  Date Value Ref Range Status  12/05/2022 13.5 11.0 - 15.0 % Final         Passed - Cr in normal range and within 360 days    Creat  Date Value Ref Range Status  12/05/2022 0.78 0.60 - 1.00 mg/dL Final   Creatinine, Urine  Date Value Ref Range Status  12/05/2022 57 20 - 275 mg/dL Final         Passed - HBA1C is between 0 and 7.9 and within 180 days    Hgb A1c MFr Bld  Date Value Ref  Range Status  12/05/2022 7.5 (H) <5.7 % of total Hgb Final    Comment:    For someone without known diabetes, a hemoglobin A1c value of 6.5% or greater indicates that they may have  diabetes and this should be confirmed with a follow-up  test. . For someone with known diabetes, a value <7% indicates  that their diabetes is well controlled and a value  greater than or equal to 7% indicates suboptimal  control. A1c targets should be individualized based on  duration of diabetes, age, comorbid conditions, and  other considerations. . Currently, no consensus exists regarding use of hemoglobin A1c for diagnosis of diabetes for children. .          Passed - eGFR in normal range and within 360 days    GFR, Est African American  Date Value Ref Range Status  06/10/2018 91 > OR = 60 mL/min/1.48m2 Final   GFR, Est Non African American  Date Value Ref Range Status  06/10/2018 79 > OR = 60 mL/min/1.44m2 Final   eGFR  Date Value Ref Range  Status  12/05/2022 82 > OR = 60 mL/min/1.43m2 Final

## 2023-06-10 ENCOUNTER — Encounter: Payer: Medicare HMO | Admitting: Internal Medicine

## 2023-07-17 ENCOUNTER — Other Ambulatory Visit (INDEPENDENT_AMBULATORY_CARE_PROVIDER_SITE_OTHER): Payer: Medicare HMO | Admitting: Pharmacist

## 2023-07-17 DIAGNOSIS — Z794 Long term (current) use of insulin: Secondary | ICD-10-CM

## 2023-07-17 DIAGNOSIS — E1165 Type 2 diabetes mellitus with hyperglycemia: Secondary | ICD-10-CM

## 2023-07-17 NOTE — Patient Instructions (Addendum)
 Goals Addressed             This Visit's Progress    Pharmacy Goals       Our goal A1c is less than 7%. This corresponds with fasting sugars less than 130 and 2 hour after meal sugars less than 180. Please check your blood sugars and keep log of results  Our goal bad cholesterol, or LDL, is less than 70 . This is why it is important to continue taking your atorvastatin   Feel free to call me with any questions or concerns. I look forward to our next call!  Arthur Lash, PharmD, BCACP Clinical Pharmacist [facility] [place] [phone]        Hypoglycemia is low blood glucose--or low blood sugar--that is below the healthy range. This is usually  when your blood glucose is less than 70 mg/dL, but you should also monitor for hypoglycemia based on symptoms.  Symptoms of hypoglycemia may include feeling: - Shaky - Sweaty - Dizziness - Confusion or difficulty speaking - Feeling weak or tired - Or you may have no symptoms at all   If low blood glucose is not treated, it can become severe and may cause you to pass out   Check your blood glucose right away if you have any symptoms of low blood sugar ( If you think your blood glucose is low but cannot check it at that time, treat anyway)  Treat by eating or drinking 15 grams of something high in sugar, such as:  4 ounces ( cup) of regular fruit juice (like orange, apple, or grape juice)  4 glucose tablets or 1 tube of glucose gel  1 tablespoon of sugar, honey, or corn syrup  4 ounces ( cup) of regular soda pop (not diet)  2 tablespoons of raisins  Wait 15 minutes and then check your blood glucose again - If it is still low, eat or drink something high in sugar again -  If your next meal is more than an hour away, eat a snack to keep your low blood glucose from coming back  Please contact your provider's office if you have concerns about your blood sugar, particularly if you have symptoms of low  blood sugar.

## 2023-07-17 NOTE — Progress Notes (Signed)
 07/17/2023 Name: Summer Young: [id number] DOB: June 05, 1950  Chief Complaint  Patient presents with   Medication Management    Summer Young is a 73 y.o. year old female who presented for a telephone visit.   They were referred to the pharmacist by their PCP for assistance in managing diabetes, hypertension, hyperlipidemia, and medication access.      Subjective:   Care Team: Primary Care Provider: Carollynn Cirri, NP ; Next Scheduled Visit: 08/31/2023 Orthopedic Surgeon: Summer Charters, MD; Next Scheduled Visit: 08/10/2023     Medication Access/Adherence  Current Pharmacy:  [facility] [address] [place] - [address] [place] Phone: [phone] Fax: [phone]   Patient reports affordability concerns with their medications: No  Patient reports access/transportation concerns to their pharmacy: No  Patient reports adherence concerns with their medications:  No     Denies using weekly pillbox, but reports has her own strategy (keeping track of when takes her medications)     Type 2 Diabetes:   Current medications:  Tresiba  19 units once daily Metformin  ER 500 mg - 3 tablets daily with breakfast Ozempic  1 mg once weekly   Patient using Freestyle Libre 3 Plus continuous blood glucose monitor (CGM) Using her iPhone in place of reader devices Review results from Freestyle LibreView:   Date of Download: 07/17/23 % Time CGM is active: 96% Average Glucose: 115 mg/dL Glucose Management Indicator: 6.1%  Glucose Variability: 31.2% (goal <36%) Time in Goal:  - Time in range 70-180: 90% - Time above range: 5% - Time below range: 5% Observed patterns: AM low blood sugar readings (56-69) ~4 days/week    Admits to recent episodes of hypoglycemia (sweaty, shaky, weak) in mornings with readings ranging 56-69  Attributes low blood sugar readings to limited appetite recently Note patient carries glucose tablets with her From  review of patient's information from Libreview, note patient currently has her Low Glucose alarm off    Current physical activity: walking her dog ~20 minutes x once weekly; cleaning inside her home   Statin: atorvastatin  40 mg daily     Current medication access support: Enrolled in Tresiba  and Ozempic  medication assistance from [employer] through 02/17/2024     Objective:  Lab Results  Component Value Date   HGBA1C 7.5 (H) 12/05/2022    Lab Results  Component Value Date   CREATININE 0.78 12/05/2022   BUN 12 12/05/2022   NA 139 12/05/2022   K 5.1 12/05/2022   CL 105 12/05/2022   CO2 24 12/05/2022    Lab Results  Component Value Date   CHOL 131 12/05/2022   HDL 54 12/05/2022   LDLCALC 58 12/05/2022   TRIG 105 12/05/2022   CHOLHDL 2.4 12/05/2022    Medications Reviewed Today     Reviewed by Summer Young, RPH-CPP (Pharmacist) on 07/17/23 at 0902  Med List Status: <None>   Medication Order Taking? Sig Documenting Provider Last Dose Status Informant  acetaminophen (TYLENOL) 650 MG CR tablet [id number]  Take 650 mg by mouth 2 (two) times daily as needed for pain. [provider]  Active   alendronate  (FOSAMAX ) 70 MG tablet [id number]  TAKE 1 TABLET BY MOUTH ONCE A WEEK WITH  A  FULL  GLASS  OF  WATER  ON  AN  EMPTY  STOMACH Baity, Rankin Buzzard, NP  Active   atorvastatin  (LIPITOR) 40 MG tablet [id number]  Take 1 tablet by mouth once daily Summer Cirri, NP  Active   blood glucose meter kit and supplies [id number]  Dispense based on patient and insurance preference. Use up to four times daily as directed. (FOR ICD-10 E10.9, E11.9). Summer Barren, FNP  Active   Blood Glucose Monitoring Suppl [facility] VERIO) w/Device KIT [id number]  Use to check blood sugar as directed.  DX: E11.65 Summer Cirri, NP  Active   cetirizine  (ZYRTEC ) 10 MG tablet [id number]  Take 1 tablet (10 mg total) by mouth daily.  Patient taking differently: Take 10 mg by mouth daily as needed.    Summer Cirri, NP  Active   Cholecalciferol (VITAMIN D3) 50 MCG (2000 UT) capsule [id number]  Take 1,000 Units by mouth daily.  [provider]  Active   Continuous Glucose Sensor (FREESTYLE LIBRE 3 PLUS SENSOR) MISC [id number]  Place 1 sensor on the skin every 15 days. Use to check glucose continuously Summer Cirri, NP  Active   Diclofenac  Sodium 3 % GEL [id number]  Apply 2-3 gm topically, 4 times daily. [provider]  Active   EPINEPHrine  0.3 mg/0.3 mL IJ SOAJ injection [id number]  Inject 0.3 mg into the muscle as needed for anaphylaxis. Summer Cirri, NP  Active   fluticasone  (FLONASE ) 50 MCG/ACT nasal spray [id number]  Place 2 sprays into both nostrils daily. Use for 4-6 weeks then stop and use seasonally or as needed. Summer Cirri, NP  Active   gabapentin (NEURONTIN) 100 MG capsule [id number]  Take 100 mg by mouth at bedtime. [provider]  Active   insulin  degludec (TRESIBA  FLEXTOUCH) 100 UNIT/ML FlexTouch Pen [id number] Yes Inject 26 Units into the skin daily.  Patient taking differently: Inject 19 Units into the skin daily.   Summer Cirri, NP Taking Active   metFORMIN  (GLUCOPHAGE -XR) 500 MG 24 hr tablet [id number] Yes TAKE 3 TABLETS BY MOUTH ONCE DAILY WITH BREAKFAST Summer Cirri, NP Taking Active   naproxen  (NAPROSYN ) 500 MG tablet [id number]  Take 1 tablet (500 mg total) by mouth 2 (two) times daily with a meal. Summer Cirri, NP  Active   omeprazole  (PRILOSEC) 40 MG capsule [id number]  TAKE 1 CAPSULE BY MOUTH ONCE DAILY BEFORE BREAKFAST Baity, Rankin Buzzard, NP  Active            Med Note Summer Young Aug 28, 2022  3:01 PM) Takes PRN   Summer Young 33G MISC [id number]  Use to check blood sugar as directed.  DX: E11.65 Summer Cirri, NP  Active   Summer Young test strip [id number]  Check blood sugar 1 x daily as directed Summer Bunting, DO  Active   OZEMPIC , 1 MG/DOSE, 4 MG/3ML Summer Young [id number] Yes INJECT 1MG  INTO THE  SKIN ONCE WEEKLY Baity, Rankin Buzzard, NP Taking Active   vitamin B-12 (CYANOCOBALAMIN) 1000 MCG tablet [id number]  Take 1,000 mcg by mouth daily.  [provider]  Active               Assessment/Plan:   Diabetes: - Discuss with patient impact of Ozempic  on appetite/option to reduce this dose. Patient requests to first try working on her diet/appetite before considering change to Ozempic   Will ask provider to consider referring patient to dietitian - Collaborate with PCP via secure chat regarding patient's latest blood sugar readings per CGM and medication management. Recommend reducing patient's Tresiba  dose to 16 units daily. Provider agrees             Follow up  with patient to provide update             Patient verbalizes understanding of plan via teach back  - Encourage patient to get back to having regular well-balanced meals and snacks, while controlling carbohydrate portion sizes  Advise against skipping meals Encouraged patient to have a consistent planned carbohydrate + protein snack (such as crackers/apple + peanut butter) at bedtime  - Review counseling on symptoms of/how to manage low blood sugar - Counsel patient on turning low glucose alarm for her Freestyle Libre 3 monitor back on - Patient to use CGM as feedback on dietary choices/habits to aid with glucose control.  Patient to follow up with office if needed for readings outside of established parameters or symptoms, particularly if has any further low blood sugar readings  - Patient to contact [employer] as needed for refills of Tresiba  or Ozempic     Follow Up Plan: Clinic Pharmacist will follow up with patient by telephone on 08/14/2023 at 8:30 AM    Arthur Lash, PharmD, [facility] Clinical Pharmacist [facility] [phone]

## 2023-08-10 LAB — OPHTHALMOLOGY REPORT-SCANNED

## 2023-08-11 ENCOUNTER — Other Ambulatory Visit: Payer: Self-pay | Admitting: Internal Medicine

## 2023-08-11 DIAGNOSIS — M810 Age-related osteoporosis without current pathological fracture: Secondary | ICD-10-CM

## 2023-08-11 DIAGNOSIS — R112 Nausea with vomiting, unspecified: Secondary | ICD-10-CM

## 2023-08-11 DIAGNOSIS — K279 Peptic ulcer, site unspecified, unspecified as acute or chronic, without hemorrhage or perforation: Secondary | ICD-10-CM

## 2023-08-11 DIAGNOSIS — K219 Gastro-esophageal reflux disease without esophagitis: Secondary | ICD-10-CM

## 2023-08-12 NOTE — Telephone Encounter (Signed)
 Requested Prescriptions  Pending Prescriptions Disp Refills   alendronate  (FOSAMAX ) 70 MG tablet [Pharmacy Med Name: Alendronate  Sodium 70 MG Oral Tablet] 12 tablet 0    Sig: TAKE 1 TABLET BY MOUTH ONCE A WEEK WITH  A  FULL  GLASS  OF  WATER  ON  AN  EMPTY  STOMACH     Endocrinology:  Bisphosphonates Failed - 08/12/2023  4:45 PM      Failed - Vitamin D in normal range and within 360 days    No results found for: [id number], CI6874NY7, CI874NY7UNU, 25OHVITD3, 25OHVITD2, 25OHVITD1, VD25OH       Failed - Mg Level in normal range and within 360 days    No results found for: MG       Failed - Phosphate in normal range and within 360 days    No results found for: PHOS       Failed - Valid encounter within last 12 months    Recent Outpatient Visits   None            Passed - Ca in normal range and within 360 days    Calcium   Date Value Ref Range Status  12/05/2022 9.3 8.6 - 10.4 mg/dL Final         Passed - Cr in normal range and within 360 days    Creat  Date Value Ref Range Status  12/05/2022 0.78 0.60 - 1.00 mg/dL Final   Creatinine, Urine  Date Value Ref Range Status  12/05/2022 57 20 - 275 mg/dL Final         Passed - eGFR is 30 or above and within 360 days    GFR, Est African American  Date Value Ref Range Status  06/10/2018 91 > OR = 60 mL/min/1.69m2 Final   GFR, Est Non African American  Date Value Ref Range Status  06/10/2018 79 > OR = 60 mL/min/1.28m2 Final   eGFR  Date Value Ref Range Status  12/05/2022 82 > OR = 60 mL/min/1.67m2 Final         Passed - Bone Mineral Density or Dexa Scan completed in the last 2 years       omeprazole  (PRILOSEC) 40 MG capsule [Pharmacy Med Name: OMEPRAZOLE  DR 40MG   CAP] 90 capsule 0    Sig: TAKE 1 CAPSULE BY MOUTH ONCE DAILY BEFORE BREAKFAST     Gastroenterology: Proton Pump Inhibitors Failed - 08/12/2023  4:45 PM      Failed - Valid encounter within last 12 months    Recent Outpatient Visits   None

## 2023-08-12 NOTE — Telephone Encounter (Signed)
 Requested medication (s) are due for refill today: yes  Requested medication (s) are on the active medication list: yes  Last refill:  05/21/23 #12/0  Future visit scheduled: yes  Notes to clinic:  Unable to refill per protocol due to failed labs, no updated results.      Requested Prescriptions  Pending Prescriptions Disp Refills   alendronate  (FOSAMAX ) 70 MG tablet [Pharmacy Med Name: Alendronate  Sodium 70 MG Oral Tablet] 12 tablet 0    Sig: TAKE 1 TABLET BY MOUTH ONCE A WEEK WITH  A  FULL  GLASS  OF  WATER  ON  AN  EMPTY  STOMACH     Endocrinology:  Bisphosphonates Failed - 08/12/2023  4:45 PM      Failed - Vitamin D in normal range and within 360 days    No results found for: [id number], CI6874NY7, CI874NY7UNU, 25OHVITD3, 25OHVITD2, 25OHVITD1, VD25OH       Failed - Mg Level in normal range and within 360 days    No results found for: MG       Failed - Phosphate in normal range and within 360 days    No results found for: PHOS       Failed - Valid encounter within last 12 months    Recent Outpatient Visits   None            Passed - Ca in normal range and within 360 days    Calcium   Date Value Ref Range Status  12/05/2022 9.3 8.6 - 10.4 mg/dL Final         Passed - Cr in normal range and within 360 days    Creat  Date Value Ref Range Status  12/05/2022 0.78 0.60 - 1.00 mg/dL Final   Creatinine, Urine  Date Value Ref Range Status  12/05/2022 57 20 - 275 mg/dL Final         Passed - eGFR is 30 or above and within 360 days    GFR, Est African American  Date Value Ref Range Status  06/10/2018 91 > OR = 60 mL/min/1.49m2 Final   GFR, Est Non African American  Date Value Ref Range Status  06/10/2018 79 > OR = 60 mL/min/1.21m2 Final   eGFR  Date Value Ref Range Status  12/05/2022 82 > OR = 60 mL/min/1.42m2 Final         Passed - Bone Mineral Density or Dexa Scan completed in the last 2 years      Signed Prescriptions Disp Refills    omeprazole  (PRILOSEC) 40 MG capsule 90 capsule 0    Sig: TAKE 1 CAPSULE BY MOUTH ONCE DAILY BEFORE BREAKFAST     Gastroenterology: Proton Pump Inhibitors Failed - 08/12/2023  4:45 PM      Failed - Valid encounter within last 12 months    Recent Outpatient Visits   None

## 2023-08-14 ENCOUNTER — Other Ambulatory Visit (INDEPENDENT_AMBULATORY_CARE_PROVIDER_SITE_OTHER): Admitting: Pharmacist

## 2023-08-14 DIAGNOSIS — E1165 Type 2 diabetes mellitus with hyperglycemia: Secondary | ICD-10-CM

## 2023-08-14 DIAGNOSIS — Z794 Long term (current) use of insulin: Secondary | ICD-10-CM

## 2023-08-14 NOTE — Progress Notes (Signed)
 08/14/2023 Name: Summer Young MRN: [id number] DOB: [birth year]  Chief Complaint  Patient presents with   Medication Management    Summer Young is a 73 y.o. year old female who presented for a telephone visit.   They were referred to the pharmacist by their PCP for assistance in managing diabetes, hypertension, hyperlipidemia, and medication access.      Subjective:   Care Team: Primary Care Provider: Antonette Angeline ORN, NP ; Next Scheduled Visit: 08/31/2023 Orthopedic Surgeon: Edie Norleen Purchase, MD; Next Scheduled Visit: 08/10/2023  Dietitian: Norberto Thersia NOVAK, RD; Initial Appointment: 09/14/2023     Medication Access/Adherence  Current Pharmacy:  [facility] [address], [place] - [address] [place] [address] Phone: [phone] Fax: [phone]   Patient reports affordability concerns with their medications: No  Patient reports access/transportation concerns to their pharmacy: No  Patient reports adherence concerns with their medications:  No     Denies using weekly pillbox, but reports has her own strategy (keeping track of when takes her medications)     Type 2 Diabetes:   Current medications:  Tresiba  16 units once daily Metformin  ER 500 mg - 3 tablets daily with breakfast Ozempic  1 mg once weekly   Patient using Freestyle Libre 3 Plus continuous blood glucose monitor (CGM) Using her iPhone in place of reader devices Review results from Freestyle LibreView:   Date of Download: 08/14/23 % Time CGM is active: 94% Average Glucose: 129 mg/dL Glucose Management Indicator: 6.4%  Glucose Variability: 30.2% (goal <36%) Time in Goal:  - Time in range 70-180: 87% - Time above range: 11% - Time below range: 2%     Admits to recent episodes of hypoglycemia (shaky) in mornings with readings of 64 & 65 for past 2 days Attributes low blood sugar readings to eating early supper (6:30 pm) and has not been having her balanced bedtime snack  for past few days Note patient carries glucose tablets with her From review of patient's information from [facility], note patient currently has her Low Glucose alarm off     Statin: atorvastatin  40 mg daily     Current medication access support: Enrolled in Tresiba  and Ozempic  medication assistance from [employer] through 02/17/2024   Objective:  Lab Results  Component Value Date   HGBA1C 7.5 (H) 12/05/2022    Lab Results  Component Value Date   CREATININE 0.78 12/05/2022   BUN 12 12/05/2022   NA 139 12/05/2022   K 5.1 12/05/2022   CL 105 12/05/2022   CO2 24 12/05/2022    Lab Results  Component Value Date   CHOL 131 12/05/2022   HDL 54 12/05/2022   LDLCALC 58 12/05/2022   TRIG 105 12/05/2022   CHOLHDL 2.4 12/05/2022    Medications Reviewed Today     Reviewed by Alana Sharyle LABOR, RPH-CPP (Pharmacist) on 08/14/23 at 1337  Med List Status: <None>   Medication Order Taking? Sig Documenting Provider Last Dose Status Informant  acetaminophen (TYLENOL) 650 MG CR tablet [id number]  Take 650 mg by mouth 2 (two) times daily as needed for pain. [provider]  Active   alendronate  (FOSAMAX ) 70 MG tablet [id number]  TAKE 1 TABLET BY MOUTH ONCE A WEEK WITH  A  FULL  GLASS  OF  WATER  ON  AN  EMPTY  STOMACH Baity, Angeline ORN, NP  Active   atorvastatin  (LIPITOR) 40 MG tablet [id number]  Take 1 tablet by mouth once daily Antonette Angeline ORN, NP  Active   blood glucose meter kit and supplies [id number]  Dispense based on patient and insurance preference. Use up to four times daily as directed. (FOR ICD-10 E10.9, E11.9). Fredrik Nat HERO, FNP  Active   Blood Glucose Monitoring Suppl [facility] VERIO) w/Device KIT [id number]  Use to check blood sugar as directed.  DX: E11.65 Antonette Angeline ORN, NP  Active   cetirizine  (ZYRTEC ) 10 MG tablet [id number]  Take 1 tablet (10 mg total) by mouth daily.  Patient taking differently: Take 10 mg by mouth daily as needed.   Antonette Angeline ORN, NP  Active    Cholecalciferol (VITAMIN D3) 50 MCG (2000 UT) capsule [id number]  Take 1,000 Units by mouth daily.  [provider]  Active   Continuous Glucose Sensor (FREESTYLE LIBRE 3 PLUS SENSOR) MISC [id number]  Place 1 sensor on the skin every 15 days. Use to check glucose continuously Antonette Angeline ORN, NP  Active   Diclofenac  Sodium 3 % GEL [id number]  Apply 2-3 gm topically, 4 times daily. [provider]  Active   EPINEPHrine  0.3 mg/0.3 mL IJ SOAJ injection [id number]  Inject 0.3 mg into the muscle as needed for anaphylaxis. Antonette Angeline ORN, NP  Active   fluticasone  (FLONASE ) 50 MCG/ACT nasal spray [id number]  Place 2 sprays into both nostrils daily. Use for 4-6 weeks then stop and use seasonally or as needed. Antonette Angeline ORN, NP  Active   gabapentin (NEURONTIN) 100 MG capsule [id number]  Take 100 mg by mouth at bedtime. [provider]  Active   insulin  degludec (TRESIBA  FLEXTOUCH) 100 UNIT/ML FlexTouch Pen [id number] Yes Inject 26 Units into the skin daily.  Patient taking differently: Inject 16 Units into the skin daily.   Antonette Angeline ORN, NP  Active   metFORMIN  (GLUCOPHAGE -XR) 500 MG 24 hr tablet [id number] Yes TAKE 3 TABLETS BY MOUTH ONCE DAILY WITH BREAKFAST Baity, Angeline ORN, NP  Active   naproxen  (NAPROSYN ) 500 MG tablet [id number]  Take 1 tablet (500 mg total) by mouth 2 (two) times daily with a meal. Antonette Angeline ORN, NP  Active   omeprazole  (PRILOSEC) 40 MG capsule [id number]  TAKE 1 CAPSULE BY MOUTH ONCE DAILY BEFORE BREAKFAST Antonette Angeline ORN, NP  Active   OneTouch Delica Lancets 33G MISC [id number]  Use to check blood sugar as directed.  DX: E11.65 Antonette Angeline ORN, NP  Active   [facility] ULTRA test strip [id number]  Check blood sugar 1 x daily as directed Edman Marsa PARAS, DO  Active   OZEMPIC , 1 MG/DOSE, 4 MG/3ML NELMA [id number] Yes INJECT 1MG  INTO THE SKIN ONCE WEEKLY Baity, Angeline ORN, NP  Active   vitamin B-12 (CYANOCOBALAMIN) 1000 MCG tablet [id number]  Take 1,000 mcg by  mouth daily.  [provider]  Active               Assessment/Plan:   Diabetes: - Patient plans to attend initial appointment with dietitian on 09/14/2023 - Encourage patient to get back to having regular well-balanced meals and snacks, while controlling carbohydrate portion sizes             Advise against skipping meals Encouraged patient to have a consistent planned carbohydrate + protein snack (such as crackers/apple + peanut butter) at bedtime  - Review counseling on symptoms of/how to manage low blood sugar - Counsel patient on turning low glucose alarm for her Freestyle Libre 3 monitor back on - Patient to use CGM as feedback on dietary choices/habits to aid with glucose control.  Patient to follow up with office if needed for readings outside of established parameters or symptoms, particularly if has any further low blood sugar readings  - Patient to contact [facility] as needed for refills of Tresiba  or Ozempic      Follow Up Plan: Clinic Pharmacist will follow up with patient by telephone on 10/05/2023 at 8:30 AM    [name], PharmD, [facility] Clinical Pharmacist [facility] [phone]

## 2023-08-14 NOTE — Patient Instructions (Signed)
 Goals Addressed             This Visit's Progress    Pharmacy Goals       Our goal A1c is less than 7%. This corresponds with fasting sugars less than 130 and 2 hour after meal sugars less than 180. Please check your blood sugars and keep log of results  Our goal bad cholesterol, or LDL, is less than 70 . This is why it is important to continue taking your atorvastatin   Feel free to call me with any questions or concerns. I look forward to our next call!  Estelle Grumbles, PharmD, [facility] Clinical Pharmacist [facility] [phone]

## 2023-08-25 ENCOUNTER — Other Ambulatory Visit: Payer: Self-pay | Admitting: Internal Medicine

## 2023-08-25 DIAGNOSIS — Z794 Long term (current) use of insulin: Secondary | ICD-10-CM

## 2023-08-27 NOTE — Telephone Encounter (Signed)
 Requested Prescriptions  Pending Prescriptions Disp Refills   metFORMIN  (GLUCOPHAGE -XR) 500 MG 24 hr tablet [Pharmacy Med Name: metFORMIN  HCl ER 500 MG Oral Tablet Extended Release 24 Hour] 270 tablet 0    Sig: TAKE 3 TABLETS BY MOUTH ONCE DAILY WITH BREAKFAST .PATIENT  MUST  KEEP  UPCOMING  APPOINTMENT  FOR  ADDITIONAL  REFILLS     Endocrinology:  Diabetes - Biguanides Failed - 08/27/2023  4:43 PM      Failed - HBA1C is between 0 and 7.9 and within 180 days    Hgb A1c MFr Bld  Date Value Ref Range Status  12/05/2022 7.5 (H) <5.7 % of total Hgb Final    Comment:    For someone without known diabetes, a hemoglobin A1c value of 6.5% or greater indicates that they may have  diabetes and this should be confirmed with a follow-up  test. . For someone with known diabetes, a value <7% indicates  that their diabetes is well controlled and a value  greater than or equal to 7% indicates suboptimal  control. A1c targets should be individualized based on  duration of diabetes, age, comorbid conditions, and  other considerations. . Currently, no consensus exists regarding use of hemoglobin A1c for diagnosis of diabetes for children. .          Failed - B12 Level in normal range and within 720 days    No results found for: VITAMINB12       Failed - Valid encounter within last 6 months    Recent Outpatient Visits   None            Failed - CBC within normal limits and completed in the last 12 months    WBC  Date Value Ref Range Status  12/05/2022 15.2 (H) 3.8 - 10.8 Thousand/uL Final   RBC  Date Value Ref Range Status  12/05/2022 5.07 3.80 - 5.10 Million/uL Final   Hemoglobin  Date Value Ref Range Status  12/05/2022 13.7 11.7 - 15.5 g/dL Final   HCT  Date Value Ref Range Status  12/05/2022 43.0 35.0 - 45.0 % Final   MCHC  Date Value Ref Range Status  12/05/2022 31.9 (L) 32.0 - 36.0 g/dL Final    Comment:    For adults, a slight decrease in the calculated MCHC value  (in the range of 30 to 32 g/dL) is most likely not clinically significant; however, it should be interpreted with caution in correlation with other red cell parameters and the patient's clinical condition.    [facility]  Date Value Ref Range Status  12/05/2022 27.0 27.0 - 33.0 pg Final   MCV  Date Value Ref Range Status  12/05/2022 84.8 80.0 - 100.0 fL Final   No results found for: PLTCOUNTKUC, LABPLAT, POCPLA RDW  Date Value Ref Range Status  12/05/2022 13.5 11.0 - 15.0 % Final         Passed - Cr in normal range and within 360 days    Creat  Date Value Ref Range Status  12/05/2022 0.78 0.60 - 1.00 mg/dL Final   Creatinine, Urine  Date Value Ref Range Status  12/05/2022 57 20 - 275 mg/dL Final         Passed - eGFR in normal range and within 360 days    GFR, Est African American  Date Value Ref Range Status  06/10/2018 91 > OR = 60 mL/min/1.64m2 Final   GFR, Est Non African American  Date Value Ref Range Status  06/10/2018 79 > OR = 60 mL/min/1.58m2 Final   eGFR  Date Value Ref Range Status  12/05/2022 82 > OR = 60 mL/min/1.23m2 Final

## 2023-08-31 ENCOUNTER — Encounter: Payer: Self-pay | Admitting: Internal Medicine

## 2023-08-31 ENCOUNTER — Ambulatory Visit: Admitting: Internal Medicine

## 2023-08-31 VITALS — BP 132/74 | Ht 61.0 in | Wt 156.4 lb

## 2023-08-31 DIAGNOSIS — Z0001 Encounter for general adult medical examination with abnormal findings: Secondary | ICD-10-CM

## 2023-08-31 DIAGNOSIS — E1165 Type 2 diabetes mellitus with hyperglycemia: Secondary | ICD-10-CM

## 2023-08-31 DIAGNOSIS — E663 Overweight: Secondary | ICD-10-CM | POA: Diagnosis not present

## 2023-08-31 DIAGNOSIS — Z7984 Long term (current) use of oral hypoglycemic drugs: Secondary | ICD-10-CM

## 2023-08-31 DIAGNOSIS — Z6829 Body mass index (BMI) 29.0-29.9, adult: Secondary | ICD-10-CM | POA: Diagnosis not present

## 2023-08-31 MED ORDER — FREESTYLE LIBRE 3 PLUS SENSOR MISC
12 refills | Status: AC
Start: 2023-08-31 — End: ?

## 2023-08-31 NOTE — Assessment & Plan Note (Signed)
 Encouraged diet and exercise for weight loss ?

## 2023-08-31 NOTE — Progress Notes (Signed)
 Subjective:    Patient ID: Summer Young, female    DOB: 1951/02/01, 73 y.o.   MRN: [id number]  HPI  Patient presents to clinic today for her annual exam.  Flu: Never Tetanus: 06/2017 COVID: Pfizer x 3 Pneumovax: 09/2020 Prevnar: 11/2017 Zostavax: 10/2015 Shingrix: 09/2020 Pap smear: 08/2018 Mammogram: 08/2022 Bone density: 08/2022 Colon screening: 02/2018, every 5 years Vision screening: annually, [facility] [place] Dentist: annually  Diet: She rarely eats meat. She consumes some fruits and veggies. She tries to avoid fried foods. She drinks mostly water, juice. Exercise: None  Review of Systems     Past Medical History:  Diagnosis Date   Allergy    Chronic pain of both knees    Diabetes mellitus without complication (HCC)    Hyperlipidemia    Hypertension     Current Outpatient Medications  Medication Sig Dispense Refill   acetaminophen (TYLENOL) 650 MG CR tablet Take 650 mg by mouth 2 (two) times daily as needed for pain.     alendronate  (FOSAMAX ) 70 MG tablet TAKE 1 TABLET BY MOUTH ONCE A WEEK WITH  A  FULL  GLASS  OF  WATER  ON  AN  EMPTY  STOMACH 12 tablet 0   atorvastatin  (LIPITOR) 40 MG tablet Take 1 tablet by mouth once daily 90 tablet 3   blood glucose meter kit and supplies Dispense based on patient and insurance preference. Use up to four times daily as directed. (FOR ICD-10 E10.9, E11.9). 1 each 0   Blood Glucose Monitoring Suppl (ONETOUCH VERIO) w/Device KIT Use to check blood sugar as directed.  DX: E11.65 1 kit 0   cetirizine  (ZYRTEC ) 10 MG tablet Take 1 tablet (10 mg total) by mouth daily. (Patient taking differently: Take 10 mg by mouth daily as needed.) 30 tablet 0   Cholecalciferol (VITAMIN D3) 50 MCG (2000 UT) capsule Take 1,000 Units by mouth daily.      Continuous Glucose Sensor (FREESTYLE LIBRE 3 PLUS SENSOR) MISC Place 1 sensor on the skin every 15 days. Use to check glucose continuously 2 each 12   Diclofenac  Sodium 3 % GEL Apply 2-3 gm  topically, 4 times daily.     EPINEPHrine  0.3 mg/0.3 mL IJ SOAJ injection Inject 0.3 mg into the muscle as needed for anaphylaxis. 1 each 1   fluticasone  (FLONASE ) 50 MCG/ACT nasal spray Place 2 sprays into both nostrils daily. Use for 4-6 weeks then stop and use seasonally or as needed. 16 g 0   gabapentin (NEURONTIN) 100 MG capsule Take 100 mg by mouth at bedtime.     insulin  degludec (TRESIBA  FLEXTOUCH) 100 UNIT/ML FlexTouch Pen Inject 26 Units into the skin daily. (Patient taking differently: Inject 16 Units into the skin daily.) 15 mL 0   metFORMIN  (GLUCOPHAGE -XR) 500 MG 24 hr tablet TAKE 3 TABLETS BY MOUTH ONCE DAILY WITH BREAKFAST .PATIENT  MUST  KEEP  UPCOMING  APPOINTMENT  FOR  ADDITIONAL  REFILLS 270 tablet 0   naproxen  (NAPROSYN ) 500 MG tablet Take 1 tablet (500 mg total) by mouth 2 (two) times daily with a meal. 14 tablet 0   omeprazole  (PRILOSEC) 40 MG capsule TAKE 1 CAPSULE BY MOUTH ONCE DAILY BEFORE BREAKFAST 90 capsule 0   OneTouch Delica Lancets 33G MISC Use to check blood sugar as directed.  DX: E11.65 100 each 1   ONETOUCH ULTRA test strip Check blood sugar 1 x daily as directed 100 each 12   OZEMPIC , 1 MG/DOSE, 4 MG/3ML SOPN INJECT 1MG   INTO THE SKIN ONCE WEEKLY 3 mL 0   vitamin B-12 (CYANOCOBALAMIN) 1000 MCG tablet Take 1,000 mcg by mouth daily.      No current facility-administered medications for this visit.    Allergies  Allergen Reactions   Aspirin Hives    Other reaction(s): Other (See Comments) Other Reaction: Not Assessed    Insulin  Detemir Hives and Rash    Errythema, edema, heat at site of injection did not improve after 2 weeks of use.     Shellfish Allergy Anaphylaxis   Dapagliflozin Other (See Comments)    Yeast infections  Yeast infection  Farxiga   Influenza Vaccines Other (See Comments)    Forxiga   Other     Other reaction(s): Other (See Comments) Uncoded Allergy. Allergen: Shellfish, Other Reaction: Not Assessed    Flu vaccine given 1998 caused  anaphylaxis    Family History  Problem Relation Age of Onset   Diabetes Mother    Diabetes Father    Stroke Father    Diabetes Sister    Heart attack Other    Diabetes Sister    Diabetes Sister    Diabetes Sister     Social History   Socioeconomic History   Marital status: Married    Spouse name: Reynoso,Eduardo   Number of children: Not on file   Years of education: Not on file   Highest education level: High school graduate  Occupational History   Occupation: housekeeper    Comment: self-employed   Occupation: Self- Employed  Tobacco Use   Smoking status: Never   Smokeless tobacco: Never  Vaping Use   Vaping status: Never Used  Substance and Sexual Activity   Alcohol use: Yes    Alcohol/week: 0.0 - 1.0 standard drinks of alcohol    Comment: occasional   Drug use: Never   Sexual activity: Yes    Birth control/protection: None  Other Topics Concern   Not on file  Social History Narrative   Not on file   Social Drivers of Health   Financial Resource Strain: Medium Risk (03/27/2023)   Received from [facility]   Overall Financial Resource Strain (CARDIA)    Difficulty of Paying Living Expenses: Somewhat hard  Food Insecurity: Food Insecurity Present (03/27/2023)   Received from [facility]   Hunger Vital Sign    Within the past 12 months, you worried that your food would run out before you got the money to buy more.: Sometimes true    Within the past 12 months, the food you bought just didn't last and you didn't have money to get more.: Never true  Transportation Needs: No Transportation Needs (03/27/2023)   Received from [facility] - Transportation    In the past 12 months, has lack of transportation kept you from medical appointments or from getting medications?: No    Lack of Transportation (Non-Medical): No  Physical Activity: Inactive (08/28/2022)   Exercise Vital Sign    Days of Exercise per  Week: 0 days    Minutes of Exercise per Session: 0 min  Stress: Stress Concern Present (08/28/2022)   [employer] of Occupational Health - Occupational Stress Questionnaire    Feeling of Stress : Rather much  Social Connections: Moderately Isolated (08/28/2022)   Social Connection and Isolation Panel    Frequency of Communication with Friends and Family: More than three times a week    Frequency of Social Gatherings with Friends and Family: Not  on file    Attends Religious Services: More than 4 times per year    Active Member of Clubs or Organizations: No    Attends Banker Meetings: Never    Marital Status: Separated  Intimate Partner Violence: Not At Risk (08/28/2022)   Humiliation, Afraid, Rape, and Kick questionnaire    Fear of Current or Ex-Partner: No    Emotionally Abused: No    Physically Abused: No    Sexually Abused: No     Constitutional: Denies fever, malaise, fatigue, headache or abrupt weight changes.  HEENT: Denies eye pain, eye redness, ear pain, ringing in the ears, wax buildup, runny nose, nasal congestion, bloody nose, or sore throat. Respiratory: Denies difficulty breathing, shortness of breath, cough or sputum production.   Cardiovascular: Denies chest pain, chest tightness, palpitations or swelling in the hands or feet.  Gastrointestinal: Denies abdominal pain, bloating, constipation, diarrhea or blood in the stool.  GU: Denies urgency, frequency, pain with urination, burning sensation, blood in urine, odor or discharge. Musculoskeletal: Patient reports joint pain.  Denies decrease in range of motion, difficulty with gait, muscle pain or joint swelling.  Skin: Denies redness, rashes, lesions or ulcercations.  Neurological: Denies dizziness, difficulty with memory, difficulty with speech or problems with balance and coordination.  Psych: Denies anxiety, depression, SI/HI.  No other specific complaints in a complete review of systems (except as  listed in HPI above).  Objective:   Physical Exam  BP 132/74 (BP Location: Left Arm, Patient Position: Sitting, Cuff Size: Normal)   Ht 5' 1 (1.549 m)   Wt 156 lb 6.4 oz (70.9 kg)   BMI 29.55 kg/m    Wt Readings from Last 3 Encounters:  03/16/23 162 lb 6.4 oz (73.7 kg)  12/05/22 159 lb (72.1 kg)  11/11/22 158 lb (71.7 kg)    General: Appears her stated age, overweight, in NAD. Skin: Warm, dry and intact. No ulcerations noted. HEENT: Head: normal shape and size; Eyes: sclera white, no icterus, conjunctiva pink, PERRLA and EOMs intact;   Neck:  Neck supple, trachea midline. No masses, lumps or thyromegaly present.  Cardiovascular: Normal rate and rhythm. S1,S2 noted.  No murmur, rubs or gallops noted. No JVD or BLE edema. No carotid bruits noted. Pulmonary/Chest: Normal effort and positive vesicular breath sounds. No respiratory distress. No wheezes, rales or ronchi noted.  Abdomen: Normal bowel sounds.  Musculoskeletal: Strength 5/5 BUE/BLE.  No difficulty with gait.  Neurological: Alert and oriented. Cranial nerves II-XII grossly intact. Coordination normal.  Psychiatric: Mood and affect normal. Behavior is normal. Judgment and thought content normal.     BMET    Component Value Date/Time   NA 139 12/05/2022 1010   K 5.1 12/05/2022 1010   CL 105 12/05/2022 1010   CO2 24 12/05/2022 1010   GLUCOSE 126 (H) 12/05/2022 1010   BUN 12 12/05/2022 1010   CREATININE 0.78 12/05/2022 1010   CALCIUM  9.3 12/05/2022 1010   GFRNONAA 79 06/10/2018 0824   GFRAA 91 06/10/2018 0824    Lipid Panel     Component Value Date/Time   CHOL 131 12/05/2022 1010   TRIG 105 12/05/2022 1010   HDL 54 12/05/2022 1010   CHOLHDL 2.4 12/05/2022 1010   LDLCALC 58 12/05/2022 1010    CBC    Component Value Date/Time   WBC 15.2 (H) 12/05/2022 1010   RBC 5.07 12/05/2022 1010   HGB 13.7 12/05/2022 1010   HCT 43.0 12/05/2022 1010   PLT 387 12/05/2022 1010  MCV 84.8 12/05/2022 1010   MCH 27.0  12/05/2022 1010   MCHC 31.9 (L) 12/05/2022 1010   RDW 13.5 12/05/2022 1010   LYMPHSABS 2,967 04/12/2019 0818   EOSABS 95 04/12/2019 0818   BASOSABS 34 04/12/2019 0818    Hgb A1C Lab Results  Component Value Date   HGBA1C 7.5 (H) 12/05/2022            Assessment & Plan:   Preventative Health Maintenance:  Encouraged her to get a flu shot in the fall Tetanus UTD Encouraged her to get her COVID booster Pneumovax and Prevnar UTD Zostavax UTD Encouraged her to get her second Shingrix vaccine She no longer needs to screen for cervical cancer She declines mammogram at this time Bone density UTD She declines referral to GI for screening colonoscopy Encouraged her to consume a balanced diet and exercise regimen Advised her seeing eye doctor and dentist annually We will check CBC, c-Met, lipid, A1c today  RTC in 6 months, follow-up chronic conditions Angeline Laura, NP

## 2023-08-31 NOTE — Patient Instructions (Signed)
 Health Maintenance for Postmenopausal Women Menopause is a normal process in which your ability to get pregnant comes to an end. This process happens slowly over many months or years, usually between the ages of 24 and 62. Menopause is complete when you have missed your menstrual period for 12 months. It is important to talk with your health care provider about some of the most common conditions that affect women after menopause (postmenopausal women). These include heart disease, cancer, and bone loss (osteoporosis). Adopting a healthy lifestyle and getting preventive care can help to promote your health and wellness. The actions you take can also lower your chances of developing some of these common conditions. What are the signs and symptoms of menopause? During menopause, you may have the following symptoms: Hot flashes. These can be moderate or severe. Night sweats. Decrease in sex drive. Mood swings. Headaches. Tiredness (fatigue). Irritability. Memory problems. Problems falling asleep or staying asleep. Talk with your health care provider about treatment options for your symptoms. Do I need hormone replacement therapy? Hormone replacement therapy is effective in treating symptoms that are caused by menopause, such as hot flashes and night sweats. Hormone replacement carries certain risks, especially as you become older. If you are thinking about using estrogen or estrogen with progestin, discuss the benefits and risks with your health care provider. How can I reduce my risk for heart disease and stroke? The risk of heart disease, heart attack, and stroke increases as you age. One of the causes may be a change in the body's hormones during menopause. This can affect how your body uses dietary fats, triglycerides, and cholesterol. Heart attack and stroke are medical emergencies. There are many things that you can do to help prevent heart disease and stroke. Watch your blood pressure High  blood pressure causes heart disease and increases the risk of stroke. This is more likely to develop in people who have high blood pressure readings or are overweight. Have your blood pressure checked: Every 3-5 years if you are 50-75 years of age. Every year if you are 77 years old or older. Eat a healthy diet  Eat a diet that includes plenty of vegetables, fruits, low-fat dairy products, and lean protein. Do not eat a lot of foods that are high in solid fats, added sugars, or sodium. Get regular exercise Get regular exercise. This is one of the most important things you can do for your health. Most adults should: Try to exercise for at least 150 minutes each week. The exercise should increase your heart rate and make you sweat (moderate-intensity exercise). Try to do strengthening exercises at least twice each week. Do these in addition to the moderate-intensity exercise. Spend less time sitting. Even light physical activity can be beneficial. Other tips Work with your health care provider to achieve or maintain a healthy weight. Do not use any products that contain nicotine or tobacco. These products include cigarettes, chewing tobacco, and vaping devices, such as e-cigarettes. If you need help quitting, ask your health care provider. Know your numbers. Ask your health care provider to check your cholesterol and your blood sugar (glucose). Continue to have your blood tested as directed by your health care provider. Do I need screening for cancer? Depending on your health history and family history, you may need to have cancer screenings at different stages of your life. This may include screening for: Breast cancer. Cervical cancer. Lung cancer. Colorectal cancer. What is my risk for osteoporosis? After menopause, you may be  at increased risk for osteoporosis. Osteoporosis is a condition in which bone destruction happens more quickly than new bone creation. To help prevent osteoporosis or  the bone fractures that can happen because of osteoporosis, you may take the following actions: If you are 61-3 years old, get at least 1,000 mg of calcium and at least 600 international units (IU) of vitamin D per day. If you are older than age 61 but younger than age 75, get at least 1,200 mg of calcium and at least 600 international units (IU) of vitamin D per day. If you are older than age 62, get at least 1,200 mg of calcium and at least 800 international units (IU) of vitamin D per day. Smoking and drinking excessive alcohol increase the risk of osteoporosis. Eat foods that are rich in calcium and vitamin D, and do weight-bearing exercises several times each week as directed by your health care provider. How does menopause affect my mental health? Depression may occur at any age, but it is more common as you become older. Common symptoms of depression include: Feeling depressed. Changes in sleep patterns. Changes in appetite or eating patterns. Feeling an overall lack of motivation or enjoyment of activities that you previously enjoyed. Frequent crying spells. Talk with your health care provider if you think that you are experiencing any of these symptoms. General instructions See your health care provider for regular wellness exams and vaccines. This may include: Scheduling regular health, dental, and eye exams. Getting and maintaining your vaccines. These include: Influenza vaccine. Get this vaccine each year before the flu season begins. Pneumonia vaccine. Shingles vaccine. Tetanus, diphtheria, and pertussis (Tdap) booster vaccine. Your health care provider may also recommend other immunizations. Tell your health care provider if you have ever been abused or do not feel safe at home. Summary Menopause is a normal process in which your ability to get pregnant comes to an end. This condition causes hot flashes, night sweats, decreased interest in sex, mood swings, headaches, or lack  of sleep. Treatment for this condition may include hormone replacement therapy. Take actions to keep yourself healthy, including exercising regularly, eating a healthy diet, watching your weight, and checking your blood pressure and blood sugar levels. Get screened for cancer and depression. Make sure that you are up to date with all your vaccines. This information is not intended to replace advice given to you by your health care provider. Make sure you discuss any questions you have with your health care provider. Document Revised: 06/25/2020 Document Reviewed: 06/25/2020 Elsevier Patient Education  2024 [employer].

## 2023-09-01 ENCOUNTER — Ambulatory Visit: Payer: Self-pay | Admitting: Internal Medicine

## 2023-09-01 LAB — LIPID PANEL
Cholesterol: 116 mg/dL (ref <200)
HDL: 56 mg/dL (ref >=50)
LDL Cholesterol (Calc): 42 mg/dL
Non-HDL Cholesterol (Calc): 60 mg/dL (ref <130)
Total CHOL/HDL Ratio: 2.1 (calc) (ref <5.0)
Triglycerides: 96 mg/dL (ref <150)

## 2023-09-01 LAB — COMPREHENSIVE METABOLIC PANEL WITH GFR
AG Ratio: 1.6 (calc) (ref 1.0–2.5)
ALT: 16 U/L (ref 6–29)
AST: 17 U/L (ref 10–35)
Albumin: 4.4 g/dL (ref 3.6–5.1)
Alkaline phosphatase (APISO): 92 U/L (ref 37–153)
BUN: 10 mg/dL (ref 7–25)
CO2: 26 mmol/L (ref 20–32)
Calcium: 9.7 mg/dL (ref 8.6–10.4)
Chloride: 106 mmol/L (ref 98–110)
Creat: 0.63 mg/dL (ref 0.60–1.00)
Globulin: 2.7 g/dL (ref 1.9–3.7)
Glucose, Bld: 89 mg/dL (ref 65–99)
Potassium: 4.5 mmol/L (ref 3.5–5.3)
Sodium: 142 mmol/L (ref 135–146)
Total Bilirubin: 0.4 mg/dL (ref 0.2–1.2)
Total Protein: 7.1 g/dL (ref 6.1–8.1)
eGFR: 95 mL/min/1.73m2 (ref >=60)

## 2023-09-01 LAB — CBC
HCT: 42.5 % (ref 35.0–45.0)
Hemoglobin: 13.3 g/dL (ref 11.7–15.5)
MCH: 27.1 pg (ref 27.0–33.0)
MCHC: 31.3 g/dL — ABNORMAL LOW (ref 32.0–36.0)
MCV: 86.7 fL (ref 80.0–100.0)
MPV: 9.7 fL (ref 7.5–12.5)
Platelets: 353 Thousand/uL (ref 140–400)
RBC: 4.9 Million/uL (ref 3.80–5.10)
RDW: 13.2 % (ref 11.0–15.0)
WBC: 9.9 Thousand/uL (ref 3.8–10.8)

## 2023-09-01 LAB — HEMOGLOBIN A1C
Hgb A1c MFr Bld: 7.6 % — ABNORMAL HIGH (ref <5.7)
Mean Plasma Glucose: 171 mg/dL
eAG (mmol/L): 9.5 mmol/L

## 2023-09-03 ENCOUNTER — Ambulatory Visit: Payer: Medicare HMO

## 2023-09-03 VITALS — Ht 61.0 in | Wt 156.0 lb

## 2023-09-03 DIAGNOSIS — Z0000 Encounter for general adult medical examination without abnormal findings: Secondary | ICD-10-CM | POA: Diagnosis not present

## 2023-09-03 NOTE — Progress Notes (Signed)
 Subjective:   Summer Young is a 73 y.o. who presents for a Medicare Wellness preventive visit.  As a reminder, Annual Wellness Visits don't include a physical exam, and some assessments may be limited, especially if this visit is performed virtually. We may recommend an in-person follow-up visit with your provider if needed.  Visit Complete: Virtual I connected with  Charma Buttner on 09/03/23 by a audio enabled telemedicine application and verified that I am speaking with the correct person using two identifiers.  Patient Location: Home  Provider Location: Home Office  I discussed the limitations of evaluation and management by telemedicine. The patient expressed understanding and agreed to proceed.  Vital Signs: Because this visit was a virtual/telehealth visit, some criteria may be missing or patient reported. Any vitals not documented were not able to be obtained and vitals that have been documented are patient reported.    Persons Participating in Visit: Patient.  AWV Questionnaire: No: Patient Medicare AWV questionnaire was not completed prior to this visit.  Cardiac Risk Factors include: advanced age (>48men, >8 women);diabetes mellitus;hypertension     Objective:    Today's Vitals   09/03/23 1508  Weight: 156 lb (70.8 kg)  Height: 5' 1 (1.549 m)   Body mass index is 29.48 kg/m.     09/03/2023    3:14 PM 08/28/2022    3:04 PM 05/08/2020    2:06 PM 09/16/2018   11:28 AM 02/26/2018    3:35 PM 01/29/2018    9:17 AM 01/19/2018   12:19 PM  Advanced Directives  Does Patient Have a Medical Advance Directive? No No No No No  No  No   Does patient want to make changes to medical advance directive?    No - Patient declined      Would patient like information on creating a medical advance directive? No - Patient declined No - Patient declined   No - Patient declined  Yes (MAU/Ambulatory/Procedural Areas - Information given)  Yes (MAU/Ambulatory/Procedural Areas - Information  given)      Data saved with a previous flowsheet row definition    Current Medications (verified) Outpatient Encounter Medications as of 09/03/2023  Medication Sig   acetaminophen (TYLENOL) 650 MG CR tablet Take 650 mg by mouth 2 (two) times daily as needed for pain.   atorvastatin  (LIPITOR) 40 MG tablet Take 1 tablet by mouth once daily   blood glucose meter kit and supplies Dispense based on patient and insurance preference. Use up to four times daily as directed. (FOR ICD-10 E10.9, E11.9).   Blood Glucose Monitoring Suppl (ONETOUCH VERIO) w/Device KIT Use to check blood sugar as directed.  DX: E11.65   Cholecalciferol (VITAMIN D3) 50 MCG (2000 UT) capsule Take 1,000 Units by mouth daily.    Continuous Glucose Sensor (FREESTYLE LIBRE 3 PLUS SENSOR) MISC Place 1 sensor on the skin every 15 days. Use to check glucose continuously   Diclofenac  Sodium 3 % GEL Apply 2-3 gm topically, 4 times daily.   EPINEPHrine  0.3 mg/0.3 mL IJ SOAJ injection Inject 0.3 mg into the muscle as needed for anaphylaxis.   fluticasone  (FLONASE ) 50 MCG/ACT nasal spray Place 2 sprays into both nostrils daily. Use for 4-6 weeks then stop and use seasonally or as needed.   insulin  degludec (TRESIBA  FLEXTOUCH) 100 UNIT/ML FlexTouch Pen Inject 26 Units into the skin daily. (Patient taking differently: Inject 26 Units into the skin daily. 16 units at night)   metFORMIN  (GLUCOPHAGE -XR) 500 MG 24 hr tablet TAKE 3  TABLETS BY MOUTH ONCE DAILY WITH BREAKFAST .PATIENT  MUST  KEEP  UPCOMING  APPOINTMENT  FOR  ADDITIONAL  REFILLS   naproxen  (NAPROSYN ) 500 MG tablet Take 1 tablet (500 mg total) by mouth 2 (two) times daily with a meal.   omeprazole  (PRILOSEC) 40 MG capsule TAKE 1 CAPSULE BY MOUTH ONCE DAILY BEFORE BREAKFAST   OneTouch Delica Lancets 33G MISC Use to check blood sugar as directed.  DX: E11.65   ONETOUCH ULTRA test strip Check blood sugar 1 x daily as directed   OZEMPIC , 1 MG/DOSE, 4 MG/3ML SOPN INJECT 1MG  INTO THE SKIN  ONCE WEEKLY   No facility-administered encounter medications on file as of 09/03/2023.    Allergies (verified) Aspirin, Insulin  detemir, Shellfish allergy, Dapagliflozin, Influenza vaccines, and Other   History: Past Medical History:  Diagnosis Date   Allergy    Chronic pain of both knees    Diabetes mellitus without complication (HCC)    Hyperlipidemia    Hypertension    Past Surgical History:  Procedure Laterality Date   ANKLE SURGERY Left 2009   DILATION AND CURETTAGE, DIAGNOSTIC / THERAPEUTIC  1998   miscarriage   KNEE SURGERY Left 1994   ACL   Family History  Problem Relation Age of Onset   Diabetes Mother    Diabetes Father    Stroke Father    Diabetes Sister    Heart attack Other    Diabetes Sister    Diabetes Sister    Diabetes Sister    Social History   Socioeconomic History   Marital status: Married    Spouse name: Reynoso,Eduardo   Number of children: Not on file   Years of education: Not on file   Highest education level: High school graduate  Occupational History   Occupation: housekeeper    Comment: self-employed   Occupation: Self- Employed  Tobacco Use   Smoking status: Never   Smokeless tobacco: Never  Vaping Use   Vaping status: Never Used  Substance and Sexual Activity   Alcohol use: Yes    Alcohol/week: 0.0 - 1.0 standard drinks of alcohol    Comment: occasional   Drug use: Never   Sexual activity: Yes    Birth control/protection: None  Other Topics Concern   Not on file  Social History Narrative   Not on file   Social Drivers of Health   Financial Resource Strain: Low Risk  (09/03/2023)   Overall Financial Resource Strain (CARDIA)    Difficulty of Paying Living Expenses: Not hard at all  Food Insecurity: No Food Insecurity (09/03/2023)   Hunger Vital Sign    Worried About Running Out of Food in the Last Year: Never true    Ran Out of Food in the Last Year: Never true  Transportation Needs: No Transportation Needs (09/03/2023)    PRAPARE - Administrator, Civil Service (Medical): No    Lack of Transportation (Non-Medical): No  Physical Activity: Inactive (09/03/2023)   Exercise Vital Sign    Days of Exercise per Week: 0 days    Minutes of Exercise per Session: 0 min  Stress: No Stress Concern Present (09/03/2023)   [employer] of Occupational Health - Occupational Stress Questionnaire    Feeling of Stress: Not at all  Social Connections: Moderately Integrated (09/03/2023)   Social Connection and Isolation Panel    Frequency of Communication with Friends and Family: More than three times a week    Frequency of Social Gatherings with Friends and Family:  More than three times a week    Attends Religious Services: More than 4 times per year    Active Member of Clubs or Organizations: Yes    Attends Banker Meetings: More than 4 times per year    Marital Status: Separated    Tobacco Counseling Counseling given: Not Answered    Clinical Intake:  Pre-visit preparation completed: Yes  Pain : No/denies pain     BMI - recorded: 29.48 Nutritional Status: BMI 25 -29 Overweight Nutritional Risks: None Diabetes: Yes CBG done?: Yes CBG resulted in Enter/ Edit results?: Yes (CBG 121 Per patient) Did pt. bring in CBG monitor from home?: No  Lab Results  Component Value Date   HGBA1C 7.6 (H) 08/31/2023   HGBA1C 7.5 (H) 12/05/2022   HGBA1C 7.2 (H) 06/04/2022     How often do you need to have someone help you when you read instructions, pamphlets, or other written materials from your doctor or pharmacy?: 1 - Never  Interpreter Needed?: No  Information entered by :: Rojelio Blush LPN   Activities of Daily Living     09/03/2023    3:14 PM 12/05/2022   10:18 AM  In your present state of health, do you have any difficulty performing the following activities:  Hearing? 0 0  Vision? 0 1  Difficulty concentrating or making decisions? 0 1  Walking or climbing stairs? 0 1   Dressing or bathing? 0 0  Doing errands, shopping? 0 0  Preparing Food and eating ? N   Using the Toilet? N   In the past six months, have you accidently leaked urine? N   Do you have problems with loss of bowel control? N   Managing your Medications? N   Managing your Finances? N   Housekeeping or managing your Housekeeping? N     Patient Care Team: Antonette Angeline ORN, NP as PCP - General (Internal Medicine) Alana Sharyle LABOR, RPH-CPP as Pharmacist Ezzard Rolin BIRCH, LCSW as Social Worker (Licensed Clinical Social Worker)  I have updated your Care Teams any recent Medical Services you may have received from other providers in the past year.     Assessment:   This is a routine wellness examination for [place].  Hearing/Vision screen Hearing Screening - Comments:: Denies hearing difficulties   Vision Screening - Comments:: Wears rx glasses - up to date with routine eye exams with  [facility]   Goals Addressed               This Visit's Progress     Increase physical activity (pt-stated)        Get more active.       Depression Screen     09/03/2023    3:12 PM 08/31/2023    1:32 PM 03/16/2023    9:56 AM 12/05/2022   10:17 AM 10/03/2022   11:37 AM 08/28/2022    3:02 PM 06/04/2022    8:31 AM  PHQ 2/9 Scores  PHQ - 2 Score 0 0 1 2 0 0 0  PHQ- 9 Score 0 0  5 2 0 0    Fall Risk     09/03/2023    3:14 PM 08/31/2023    1:32 PM 03/16/2023    9:56 AM 12/05/2022   10:18 AM 10/03/2022   11:37 AM  Fall Risk   Falls in the past year? 0 0 0 1 0  Number falls in past yr: 0      Injury with Fall?  0    0  Risk for fall due to : No Fall Risks    No Fall Risks  Follow up Falls evaluation completed        MEDICARE RISK AT HOME:  Medicare Risk at Home Any stairs in or around the home?: Yes If so, are there any without handrails?: No Home free of loose throw rugs in walkways, pet beds, electrical cords, etc?: Yes Adequate lighting in your home to reduce risk of falls?:  Yes Life alert?: No Use of a cane, walker or w/c?: No Grab bars in the bathroom?: Yes Shower chair or bench in shower?: No Elevated toilet seat or a handicapped toilet?: No  TIMED UP AND GO:  Was the test performed?  No  Cognitive Function: 6CIT completed        09/03/2023    3:15 PM 08/28/2022    3:06 PM 07/29/2021    8:07 AM 05/08/2020    2:09 PM 09/16/2018   11:53 AM  6CIT Screen  What Year? 0 points 0 points 0 points 0 points 0 points  What month? 0 points 0 points 0 points 0 points 0 points  What time? 0 points 0 points 0 points 0 points 0 points  Count back from 20 0 points 0 points 0 points 0 points 0 points  Months in reverse 0 points 0 points 0 points 0 points 0 points  Repeat phrase 0 points 0 points 0 points 4 points 2 points  Total Score 0 points 0 points 0 points 4 points 2 points    Immunizations Immunization History  Administered Date(s) Administered   PFIZER(Purple Top)SARS-COV-2 Vaccination 04/21/2019, 05/12/2019, 12/17/2019   Pneumococcal Conjugate-13 11/18/2017   Pneumococcal Polysaccharide-23 09/20/2008, 10/17/2020   Tdap 04/23/2007, 07/16/2017   Zoster Recombinant(Shingrix) 10/17/2020   Zoster, Live 11/12/2015    Screening Tests Health Maintenance  Topic Date Due   Zoster Vaccines- Shingrix (2 of 2) 12/01/2023 (Originally 12/12/2020)   Colonoscopy  08/30/2024 (Originally 03/06/2023)   OPHTHALMOLOGY EXAM  09/25/2023   Diabetic kidney evaluation - Urine ACR  12/05/2023   HEMOGLOBIN A1C  03/02/2024   Diabetic kidney evaluation - eGFR measurement  08/30/2024   FOOT EXAM  08/30/2024   Medicare Annual Wellness (AWV)  09/02/2024   MAMMOGRAM  09/07/2024   DTaP/Tdap/Td (3 - Td or Tdap) 07/17/2027   Pneumococcal Vaccine: 50+ Years  Completed   DEXA SCAN  Completed   Hepatitis C Screening  Completed   Hepatitis B Vaccines  Aged Out   HPV VACCINES  Aged Out   Meningococcal B Vaccine  Aged Out   COVID-19 Vaccine  Discontinued    Health  Maintenance  There are no preventive care reminders to display for this patient. Health Maintenance Items Addressed:   Additional Screening:  Vision Screening: Recommended annual ophthalmology exams for early detection of glaucoma and other disorders of the eye. Would you like a referral to an eye doctor? No    Dental Screening: Recommended annual dental exams for proper oral hygiene  Community Resource Referral / Chronic Care Management: CRR required this visit?  No   CCM required this visit?  No   Plan:    I have personally reviewed and noted the following in the patient's chart:   Medical and social history Use of alcohol, tobacco or illicit drugs  Current medications and supplements including opioid prescriptions. Patient is not currently taking opioid prescriptions. Functional ability and status Nutritional status Physical activity Advanced directives List of other physicians Hospitalizations,  surgeries, and ER visits in previous 12 months Vitals Screenings to include cognitive, depression, and falls Referrals and appointments  In addition, I have reviewed and discussed with patient certain preventive protocols, quality metrics, and best practice recommendations. A written personalized care plan for preventive services as well as general preventive health recommendations were provided to patient.   Rojelio LELON Blush, LPN   [id number]   After Visit Summary: (MyChart) Due to this being a telephonic visit, the after visit summary with patients personalized plan was offered to patient via MyChart   Notes: Nothing significant to report at this time.

## 2023-09-03 NOTE — Patient Instructions (Addendum)
 Ms. Summer Young , Thank you for taking time out of your busy schedule to complete your Annual Wellness Visit with me. I enjoyed our conversation and look forward to speaking with you again next year. I, as well as your care team,  appreciate your ongoing commitment to your health goals. Please review the following plan we discussed and let me know if I can assist you in the future. Your Game plan/ To Do List    Referrals: If you haven't heard from the office you've been referred to, please reach out to them at the phone provided.   Follow up Visits: Next Medicare AWV with our clinical staff: 09/16/24 @ 2p   Have you seen your provider in the last 6 months (3 months if uncontrolled diabetes)?  Next Office Visit with your provider: 02/12/24 @ 8a  Clinician Recommendations:  Aim for 30 minutes of exercise or brisk walking, 6-8 glasses of water, and 5 servings of fruits and vegetables each day.       This is a list of the screening recommended for you and due dates:  Health Maintenance  Topic Date Due   Zoster (Shingles) Vaccine (2 of 2) 12/01/2023*   Colon Cancer Screening  08/30/2024*   Eye exam for diabetics  09/25/2023   Yearly kidney health urinalysis for diabetes  12/05/2023   Hemoglobin A1C  03/02/2024   Yearly kidney function blood test for diabetes  08/30/2024   Complete foot exam   08/30/2024   Medicare Annual Wellness Visit  09/02/2024   Mammogram  09/07/2024   DTaP/Tdap/Td vaccine (3 - Td or Tdap) 07/17/2027   Pneumococcal Vaccine for age over 69  Completed   DEXA scan (bone density measurement)  Completed   Hepatitis C Screening  Completed   Hepatitis B Vaccine  Aged Out   HPV Vaccine  Aged Out   Meningitis B Vaccine  Aged Out   COVID-19 Vaccine  Discontinued  *Topic was postponed. The date shown is not the original due date.    Advanced directives: (Declined) Advance directive discussed with you today. Even though you declined this today, please call our office should you  change your mind, and we can give you the proper paperwork for you to fill out. Advance Care Planning is important because it:  [x]  Makes sure you receive the medical care that is consistent with your values, goals, and preferences  [x]  It provides guidance to your family and loved ones and reduces their decisional burden about whether or not they are making the right decisions based on your wishes.  Follow the link provided in your after visit summary or read over the paperwork we have mailed to you to help you started getting your Advance Directives in place. If you need assistance in completing these, please reach out to us  so that we can help you!  See attachments for Preventive Care and Fall Prevention Tips.

## 2023-09-04 ENCOUNTER — Other Ambulatory Visit: Payer: Self-pay | Admitting: Pharmacist

## 2023-09-04 ENCOUNTER — Encounter: Payer: Self-pay | Admitting: Pharmacist

## 2023-09-04 DIAGNOSIS — E1165 Type 2 diabetes mellitus with hyperglycemia: Secondary | ICD-10-CM

## 2023-09-04 DIAGNOSIS — Z794 Long term (current) use of insulin: Secondary | ICD-10-CM

## 2023-09-04 NOTE — Progress Notes (Signed)
   09/04/2023  Patient ID: Charma Buttner, female   DOB: June 12, 1950, 73 y.o.   MRN: [id number]  Patient enrolled in Ozempic  patient assistance program from [employer].  As of 7/25, Ozempic  and pen needles will no longer be eligible for automatic refill from [employer]. To request refills after this date, program must receive a refill request form from office up to 30 days prior to refill being due.  Outreach to patient today and provide this update. Advise patient to monitor her supply of Ozempic  and pen needles at home and when has only a 1 month supply remaining, to contact [name]: Phone: [phone] to request CPhT start refill form with PCP to be sent to program.  From review of chart, note patient seen for office visit with PCP on 08/31/2023. At office visit, note alendronate  70 mg once weekly prescription discontinued as noted that patient reported not taking. Today patient reports that she is still taking alendronate  - Collaborate with PCP & update medication list accordingly  [name], PharmD, [facility] Clinical Pharmacist [facility] [phone]

## 2023-09-04 NOTE — Patient Instructions (Signed)
 As of 7/25, Ozempic  and pen needles will no longer be automatically refilled from [employer] patient assistance program. To request refills after this date, program must receive a refill request form from office up to 30 days prior to refill being due.  Please monitor your supply of Ozempic  and pen needles at home and when you have only a 1 month supply remaining, contact [name] at [phone] to request she start refill process for you.  Thank you!  [name], PharmD, [facility] Clinical Pharmacist [facility] [phone]

## 2023-09-07 ENCOUNTER — Encounter: Admitting: Internal Medicine

## 2023-09-14 ENCOUNTER — Ambulatory Visit: Admitting: Skilled Nursing Facility1

## 2023-10-05 ENCOUNTER — Other Ambulatory Visit

## 2023-10-05 DIAGNOSIS — Z794 Long term (current) use of insulin: Secondary | ICD-10-CM

## 2023-10-05 DIAGNOSIS — E1165 Type 2 diabetes mellitus with hyperglycemia: Secondary | ICD-10-CM

## 2023-10-05 NOTE — Progress Notes (Cosign Needed Addendum)
 10/05/2023 Name: Summer Young MRN: [id number] DOB: [birth year]  Chief Complaint  Patient presents with   Medication Assistance   Medication Management    Summer Young is a 73 y.o. year old female who presented for a telephone visit.   They were referred to the pharmacist by their PCP for assistance in managing diabetes, hypertension, hyperlipidemia, and medication access.      Subjective:   Care Team: Primary Care Provider: Antonette Angeline ORN, NP ; Next Scheduled Visit: 03/14/2024 Orthopedic Surgeon: Edie Norleen Purchase, MD   Medication Access/Adherence  Current Pharmacy:  [facility] [address], [place] - [address] [place] [address] Phone: [phone] Fax: [phone]   Patient reports affordability concerns with their medications: No  Patient reports access/transportation concerns to their pharmacy: No  Patient reports adherence concerns with their medications:  No     Denies using weekly pillbox, but reports has her own strategy (keeping track of when takes her medications)     Type 2 Diabetes:   Current medications:  Tresiba  19 units once daily Reports self-adjusted up to current dose following direction from PCP with latest lab work (go up on your Lantus by 1 unit every 3 days until your fasting sugars are <120) Metformin  ER 500 mg - 3 tablets daily with breakfast Ozempic  1 mg once weekly   Patient using Freestyle Libre 3 Plus continuous blood glucose monitor (CGM) Using her iPhone in place of reader devices Review results from Freestyle LibreView:   Date of Download: 10/05/23 % Time CGM is active: 98% Average Glucose: 128 mg/dL Glucose Management Indicator: 6.4%  Glucose Variability: 27.9% (goal <36%) Time in Goal:  - Time in range 70-180: 91% - Time above range: 8% - Time below range: 1%        Admits to 3 recent episodes of hypoglycemia (shaky) in mornings with readings in 60s over past week Note patient carries  glucose tablets with her     Statin: atorvastatin  40 mg daily   Current Physical Activity: reports recently limited by hot weather, but planning to get back to walking with her dog   Current medication access support: Enrolled in Tresiba  and Ozempic  medication assistance from [employer] through 02/17/2024   Objective:  Lab Results  Component Value Date   HGBA1C 7.6 (H) 08/31/2023    Lab Results  Component Value Date   CREATININE 0.63 08/31/2023   BUN 10 08/31/2023   NA 142 08/31/2023   K 4.5 08/31/2023   CL 106 08/31/2023   CO2 26 08/31/2023    Lab Results  Component Value Date   CHOL 116 08/31/2023   HDL 56 08/31/2023   LDLCALC 42 08/31/2023   TRIG 96 08/31/2023   CHOLHDL 2.1 08/31/2023    Medications Reviewed Today     Reviewed by Alana Sharyle LABOR, RPH-CPP (Pharmacist) on 10/05/23 at 1533  Med List Status: <None>   Medication Order Taking? Sig Documenting Provider Last Dose Status Informant  acetaminophen (TYLENOL) 650 MG CR tablet [id number]  Take 650 mg by mouth 2 (two) times daily as needed for pain. [provider]  Active   alendronate  (FOSAMAX ) 70 MG tablet [id number]  Take 70 mg by mouth once a week. Take with a full glass of water on an empty stomach. [provider]  Active   atorvastatin  (LIPITOR) 40 MG tablet [id number]  Take 1 tablet by mouth once daily Antonette Angeline ORN, NP  Active   blood glucose meter kit and  supplies [id number]  Dispense based on patient and insurance preference. Use up to four times daily as directed. (FOR ICD-10 E10.9, E11.9). Fredrik Nat HERO, FNP  Active   Blood Glucose Monitoring Suppl [facility] VERIO) w/Device KIT [id number]  Use to check blood sugar as directed.  DX: E11.65 Antonette Angeline ORN, NP  Active   Cholecalciferol (VITAMIN D3) 50 MCG (2000 UT) capsule [id number]  Take 1,000 Units by mouth daily.  [provider]  Active   Continuous Glucose Sensor (FREESTYLE LIBRE 3 PLUS SENSOR) MISC [id number]  Place 1  sensor on the skin every 15 days. Use to check glucose continuously Antonette Angeline ORN, NP  Active   Diclofenac  Sodium 3 % GEL [id number]  Apply 2-3 gm topically, 4 times daily. [provider]  Active   EPINEPHrine  0.3 mg/0.3 mL IJ SOAJ injection [id number]  Inject 0.3 mg into the muscle as needed for anaphylaxis. Antonette Angeline ORN, NP  Active   fluticasone  (FLONASE ) 50 MCG/ACT nasal spray [id number]  Place 2 sprays into both nostrils daily. Use for 4-6 weeks then stop and use seasonally or as needed. Antonette Angeline ORN, NP  Active   insulin  degludec (TRESIBA  FLEXTOUCH) 100 UNIT/ML FlexTouch Pen [id number] Yes Inject 26 Units into the skin daily.  Patient taking differently: Inject 19 Units into the skin daily.   Antonette Angeline ORN, NP  Active   metFORMIN  (GLUCOPHAGE -XR) 500 MG 24 hr tablet [id number] Yes TAKE 3 TABLETS BY MOUTH ONCE DAILY WITH BREAKFAST .PATIENT  MUST  KEEP  UPCOMING  APPOINTMENT  FOR  ADDITIONAL  REFILLS Antonette Angeline ORN, NP  Active   naproxen  (NAPROSYN ) 500 MG tablet [id number]  Take 1 tablet (500 mg total) by mouth 2 (two) times daily with a meal. Antonette Angeline ORN, NP  Active   omeprazole  (PRILOSEC) 40 MG capsule [id number]  TAKE 1 CAPSULE BY MOUTH ONCE DAILY BEFORE BREAKFAST Antonette Angeline ORN, NP  Active   OneTouch Delica Lancets 33G MISC [id number]  Use to check blood sugar as directed.  DX: E11.65 Antonette Angeline ORN, NP  Active   [facility] ULTRA test strip [id number]  Check blood sugar 1 x daily as directed Edman Marsa PARAS, DO  Active   OZEMPIC , 1 MG/DOSE, 4 MG/3ML NELMA [id number] Yes INJECT 1MG  INTO THE SKIN ONCE WEEKLY Baity, Angeline ORN, NP  Active               Assessment/Plan:   Diabetes: - Collaborate with Dr. Edman via Secure Chat, who is covering for PCP, regarding diabetes medication management. Recommend having patient reduce her Tresiba  to 18 units daily and then that she may further decrease by 1 unit every 3 days if needed if fasting readings <100 (to target  fasting blood sugars of 100-130)   Provider agrees and recommendation provided to patient - Encourage patient to get back to having regular well-balanced meals and snacks, while controlling carbohydrate portion sizes             Advise against skipping meals Encouraged patient to have a consistent planned carbohydrate + protein snack (such as crackers/apple + peanut butter) at bedtime  - Review counseling on symptoms of/how to manage low blood sugar - Counsel patient on turning low glucose alarm for her Freestyle Libre 3 monitor back on - Patient to use CGM as feedback on dietary choices/habits to aid with glucose control.  Patient to follow up with office if needed for readings outside of established parameters or symptoms, particularly if has any further low  blood sugar readings  - Patient to contact [employer] as needed for refills of Tresiba  - Patient to contact CPhT when has 1 month supply of Ozempic  or pen needles remaining to request refill request be sent to [employer]      Follow Up Plan: Clinic Pharmacist will follow up with patient by telephone on 12/04/2023 at 8:30 AM     [name], PharmD, [facility] Clinical Pharmacist [facility] [phone]

## 2023-10-05 NOTE — Patient Instructions (Signed)
 Goals Addressed             This Visit's Progress    Pharmacy Goals       Our goal A1c is less than 7%. This corresponds with fasting sugars less than 130 and 2 hour after meal sugars less than 180. Please check your blood sugars and keep log of results  Our goal bad cholesterol, or LDL, is less than 70 . This is why it is important to continue taking your atorvastatin   Feel free to call me with any questions or concerns. I look forward to our next call!  Estelle Grumbles, PharmD, [facility] Clinical Pharmacist [facility] [phone]

## 2023-11-01 ENCOUNTER — Other Ambulatory Visit: Payer: Self-pay | Admitting: Internal Medicine

## 2023-11-01 DIAGNOSIS — M810 Age-related osteoporosis without current pathological fracture: Secondary | ICD-10-CM

## 2023-11-03 NOTE — Telephone Encounter (Signed)
 Requested medication (s) are due for refill today: yes  Requested medication (s) are on the active medication list: no  Last refill:  09/04/23  Future visit scheduled: no  Notes to clinic:  historical medication, not in current list.     Requested Prescriptions  Pending Prescriptions Disp Refills   alendronate  (FOSAMAX ) 70 MG tablet [Pharmacy Med Name: Alendronate  Sodium 70 MG Oral Tablet] 12 tablet 0    Sig: TAKE 1 TABLET BY MOUTH ONCE A WEEK WITH  A  FULL  GLASS  OF  WATER  ON  AN  EMPTY  STOMACH     Endocrinology:  Bisphosphonates Failed - 11/03/2023 11:32 AM      Failed - Vitamin D in normal range and within 360 days    No results found for: CI7874NY7, CI6874NY7, CI874NY7UNU, 25OHVITD3, 25OHVITD2, 25OHVITD1, VD25OH       Failed - Mg Level in normal range and within 360 days    No results found for: MG       Failed - Phosphate in normal range and within 360 days    No results found for: PHOS       Passed - Ca in normal range and within 360 days    Calcium   Date Value Ref Range Status  08/31/2023 9.7 8.6 - 10.4 mg/dL Final         Passed - Cr in normal range and within 360 days    Creat  Date Value Ref Range Status  08/31/2023 0.63 0.60 - 1.00 mg/dL Final   Creatinine, Urine  Date Value Ref Range Status  12/05/2022 57 20 - 275 mg/dL Final         Passed - eGFR is 30 or above and within 360 days    GFR, Est African American  Date Value Ref Range Status  06/10/2018 91 > OR = 60 mL/min/1.23m2 Final   GFR, Est Non African American  Date Value Ref Range Status  06/10/2018 79 > OR = 60 mL/min/1.61m2 Final   eGFR  Date Value Ref Range Status  08/31/2023 95 > OR = 60 mL/min/1.6m2 Final         Passed - Valid encounter within last 12 months    Recent Outpatient Visits           2 months ago Encounter for general adult medical examination with abnormal findings    [facility], Angeline ORN, NP               Passed - Bone Mineral Density or Dexa Scan completed in the last 2 years

## 2023-11-17 ENCOUNTER — Telehealth: Payer: Self-pay

## 2023-11-17 ENCOUNTER — Encounter: Payer: Self-pay | Admitting: Pharmacist

## 2023-11-17 ENCOUNTER — Other Ambulatory Visit: Payer: Self-pay | Admitting: Pharmacist

## 2023-11-17 DIAGNOSIS — Z794 Long term (current) use of insulin: Secondary | ICD-10-CM

## 2023-11-17 DIAGNOSIS — E1165 Type 2 diabetes mellitus with hyperglycemia: Secondary | ICD-10-CM

## 2023-11-17 NOTE — Telephone Encounter (Signed)
 Completed refill order form for Ozempic , Novo fine needles and faxed to provider's office for review and signature.

## 2023-11-17 NOTE — Progress Notes (Signed)
   11/17/2023  Patient ID: Summer Young, female   DOB: 03/20/1950, 73 y.o.   MRN: [id number]   Patient enrolled in Ozempic  patient assistance program from [employer] through 02/17/2024.  [employer] has announced that they will no longer offer Ozempic  to Medicare beneficiaries through their Patient Assistance Program in 2026.   Outreach to patient today and provide this update.   Based on reported income, patient does not meet criteria for Extra Help subsidy  Counsel patient that Medicare's Annual Enrollment Period for 2026 starts 12/02/2023. Encourage patient to review deductibles, formulary coverage, and copay amounts (including for Ozempic ) between plans. Also share with patient the contact information for the [facility] Information Program [facility]) that can help with reviewing these Medicare plans As requested will share this information with patient via MyChart message  Also patient to monitor her supply of Ozempic  at home for remainder of calendar year and when has only a 1 month supply remaining, to contact [name]: Phone: [phone] to request CPhT start refill form with PCP to be sent to program.    [name], PharmD, [facility] Clinical Pharmacist [facility] [phone]

## 2023-11-17 NOTE — Patient Instructions (Signed)
 Nov[employer] Nord[employer], the maker of Ozempic  and Rybelsus , has announced that they will no longer offer these medications to Medicare beneficiaries through their Patient Assistance Program in 2026.    This means that if you currently receive Ozempic  or Rybelsus  at no cost through the program, starting in January 2026 you'll need to use your insurance to continue on these medicines.    Choosing a Medicare Plan   With Medicare's Annual Enrollment Period starting on October 15th, we encourage you to review formulary coverage and copay amounts for Ozempic  or Rybelsus , as costs can vary widely between plans. Starting on Wednesday, October 1st you can compare your Medicare plan options by going to the Plan Finder tool at [employer].gov ( TeleconferenceOnDemand.fr ).    There are Medicare Specialists through the [facility] Insurance Information Program ([place]) that can help you shop for Medicare plans.    [place] [facility] toll free number: [phone] (Monday - Friday 8 am - 5 pm)   [place] [address]     [place]  [phone] Call for an appointment         Maximum Out-of-Pocket and Prescription Payment Plan   In 2026, the maximum yearly out-of-pocket cost for medications is $2,100 for all Medicare beneficiaries. This means you will not have to pay more than $2,100 in total for all prescription medications filled on your Medicare plan in 2026. You may also choose to enroll in a Medicare Prescription Payment Plan through your chosen 2026 Medicare plan. This lets you spread out your prescription drug costs over the year instead of laying a large amount all at once at the pharmacy.      We are here to support you through this transition. Please reach out with questions!   [name], PharmD, [facility] Clinical Pharmacist [facility] [phone]

## 2023-11-24 NOTE — Progress Notes (Signed)
 Emilia Kayes                                          MRN: [id number]   11/24/2023   The VBCI Quality Team Specialist reviewed this patient medical record for the purposes of chart review for care gap closure. The following were reviewed: chart review for care gap closure-kidney health evaluation for diabetes:eGFR  and uACR.    VBCI Quality Team

## 2023-12-04 ENCOUNTER — Other Ambulatory Visit: Admitting: Pharmacist

## 2023-12-04 ENCOUNTER — Encounter: Payer: Self-pay | Admitting: Pharmacist

## 2023-12-04 DIAGNOSIS — E1165 Type 2 diabetes mellitus with hyperglycemia: Secondary | ICD-10-CM

## 2023-12-04 DIAGNOSIS — Z794 Long term (current) use of insulin: Secondary | ICD-10-CM

## 2023-12-04 NOTE — Patient Instructions (Signed)
 It was a pleasure speaking with you! The following is the contact information that we discussed:   You can find more information about the [employer] Information Program [facility]) here:   [email or web address]   You can reach the [employer] for [facility] by calling:   [facility] [address]     [place]  [address] [phone]     Ask for help with [facility]   Please watch the mail for an envelope from [facility] containing the patient assistance program application. Please complete this application and bring to office to have it faxed back to Attention: [name] at Fax # [phone] along with a copy of your Medicare Part D prescription card and a copy of your proof of income document.   If you need to call Suzen, you can reach her at [phone].   Thank you!   [name], PharmD, [facility] Clinical Pharmacist [facility] [phone]

## 2023-12-04 NOTE — Progress Notes (Signed)
 12/04/2023 Name: Summer Young MRN: [id number] DOB: Jan 12, 1951  Chief Complaint  Patient presents with   Medication Management   Medication Assistance    Zelpha Messing is a 73 y.o. year old female who presented for a telephone visit.   They were referred to the pharmacist by their PCP for assistance in managing diabetes, hypertension, hyperlipidemia, and medication access.      Subjective:   Care Team: Primary Care Provider: Antonette Angeline ORN, NP ; Next Scheduled Visit: 03/14/2024 Orthopedic Surgeon: Edie Norleen Purchase, MD  Medication Access/Adherence  Current Pharmacy:  [facility] [address], [place] - [address] [place] [address] Phone: [phone] Fax: [phone]   Patient reports affordability concerns with their medications: No  Patient reports access/transportation concerns to their pharmacy: No  Patient reports adherence concerns with their medications:  No     Denies using weekly pillbox, but reports has her own strategy (keeping track of when takes her medications)     Type 2 Diabetes:   Current medications:  Tresiba  18 units once daily Metformin  ER 500 mg - 3 tablets daily with breakfast Ozempic  1 mg once weekly   Patient using Freestyle Libre 3 Plus continuous blood glucose monitor (CGM) Using her iPhone in place of reader devices Review results from Freestyle LibreView:   Date of Download: 12/04/23 % Time CGM is active: 96% Average Glucose: 136 mg/dL Glucose Management Indicator: 6.6%  Glucose Variability: 28.1% (goal <36%) Time in Goal:  - Time in range 70-180: 86% - Time above range: 14% - Time below range: 0%        Denies recent signs of hypoglycemia. Reports has received alerts from her CGM indicating lows sometimes in the morning, but when she checks with fingerstick, readings are higher/denies having symptoms Note patient carries glucose tablets with her     Statin: atorvastatin  40 mg daily    Current Physical Activity: reports recently limited by hot weather, but planning to get back to walking with her dog   Current medication access support: Enrolled in Tresiba  and Ozempic  medication assistance from [employer] through 02/17/2024   Objective:  Lab Results  Component Value Date   HGBA1C 7.6 (H) 08/31/2023    Lab Results  Component Value Date   CREATININE 0.63 08/31/2023   BUN 10 08/31/2023   NA 142 08/31/2023   K 4.5 08/31/2023   CL 106 08/31/2023   CO2 26 08/31/2023    Lab Results  Component Value Date   CHOL 116 08/31/2023   HDL 56 08/31/2023   LDLCALC 42 08/31/2023   TRIG 96 08/31/2023   CHOLHDL 2.1 08/31/2023    Medications Reviewed Today     Reviewed by Alana Sharyle LABOR, RPH-CPP (Pharmacist) on 12/04/23 at [phone]  Med List Status: <None>   Medication Order Taking? Sig Documenting Provider Last Dose Status Informant  acetaminophen (TYLENOL) 650 MG CR tablet [id number]  Take 650 mg by mouth 2 (two) times daily as needed for pain. [provider]  Active   alendronate  (FOSAMAX ) 70 MG tablet [id number]  TAKE 1 TABLET BY MOUTH ONCE A WEEK WITH  A  FULL  GLASS  OF  WATER  ON  AN  EMPTY  STOMACH Baity, Angeline ORN, NP  Active   atorvastatin  (LIPITOR) 40 MG tablet [id number]  Take 1 tablet by mouth once daily Antonette Angeline ORN, NP  Active   blood glucose meter kit and supplies [id number]  Dispense based on patient and insurance preference. Use  up to four times daily as directed. (FOR ICD-10 E10.9, E11.9). Fredrik Nat HERO, FNP  Active   Blood Glucose Monitoring Suppl [facility] VERIO) w/Device KIT [id number]  Use to check blood sugar as directed.  DX: E11.65 Antonette Angeline ORN, NP  Active   Cholecalciferol (VITAMIN D3) 50 MCG (2000 UT) capsule [id number]  Take 1,000 Units by mouth daily.  [provider]  Active   Continuous Glucose Sensor (FREESTYLE LIBRE 3 PLUS SENSOR) MISC [id number]  Place 1 sensor on the skin every 15 days. Use to check glucose  continuously Antonette Angeline ORN, NP  Active   Diclofenac  Sodium 3 % GEL [id number]  Apply 2-3 gm topically, 4 times daily. [provider]  Active   EPINEPHrine  0.3 mg/0.3 mL IJ SOAJ injection [id number]  Inject 0.3 mg into the muscle as needed for anaphylaxis. Antonette Angeline ORN, NP  Active   fluticasone  (FLONASE ) 50 MCG/ACT nasal spray [id number]  Place 2 sprays into both nostrils daily. Use for 4-6 weeks then stop and use seasonally or as needed. Antonette Angeline ORN, NP  Active   insulin  degludec (TRESIBA  FLEXTOUCH) 100 UNIT/ML FlexTouch Pen [id number] Yes Inject 26 Units into the skin daily.  Patient taking differently: Inject 18 Units into the skin daily.   Antonette Angeline ORN, NP  Active   metFORMIN  (GLUCOPHAGE -XR) 500 MG 24 hr tablet [id number] Yes TAKE 3 TABLETS BY MOUTH ONCE DAILY WITH BREAKFAST .PATIENT  MUST  KEEP  UPCOMING  APPOINTMENT  FOR  ADDITIONAL  REFILLS Antonette Angeline ORN, NP  Active   naproxen  (NAPROSYN ) 500 MG tablet [id number]  Take 1 tablet (500 mg total) by mouth 2 (two) times daily with a meal. Antonette Angeline ORN, NP  Active   omeprazole  (PRILOSEC) 40 MG capsule [id number]  TAKE 1 CAPSULE BY MOUTH ONCE DAILY BEFORE BREAKFAST Antonette Angeline ORN, NP  Active   OneTouch Delica Lancets 33G MISC [id number]  Use to check blood sugar as directed.  DX: E11.65 Antonette Angeline ORN, NP  Active   [facility] ULTRA test strip [id number]  Check blood sugar 1 x daily as directed Edman Marsa PARAS, DO  Active   OZEMPIC , 1 MG/DOSE, 4 MG/3ML NELMA [id number] Yes INJECT 1MG  INTO THE SKIN ONCE WEEKLY Baity, Angeline ORN, NP  Active               Assessment/Plan:   Diabetes: - Encourage patient to get back to having regular well-balanced meals and snacks, while controlling carbohydrate portion sizes Encouraged patient to continue consistent planned carbohydrate + protein snack (such as crackers/apple + peanut butter) at bedtime  - Review counseling on symptoms of/how to manage low blood sugar - Patient to use CGM  as feedback on dietary choices/habits to aid with glucose control.  Patient to follow up with office if needed for readings outside of established parameters or symptoms, particularly if has any further low blood sugar readings  - Patient to contact [employer] as needed for refills of Tresiba  - Will collaborate with CPhT [name] to request that she support patient with re-enrollment in [employer] patient assistance for Tresiba  and pen needles     Follow Up Plan: Clinic Pharmacist will follow up with patient by telephone on 02/01/2024 at 8:30 AM      [name], PharmD, [facility] Clinical Pharmacist [facility] [phone]

## 2023-12-08 ENCOUNTER — Telehealth: Payer: Self-pay

## 2023-12-08 NOTE — Telephone Encounter (Signed)
 PAP: Patient assistance application for Tresiba  through [employer] has been mailed to pt's home address on file. Provider portion of application will be faxed to provider's office. E-filed patient portion.

## 2023-12-09 NOTE — Telephone Encounter (Signed)
 PAP: Application for Summer Young has been submitted to [employer], via fax

## 2023-12-14 ENCOUNTER — Other Ambulatory Visit: Payer: Self-pay | Admitting: Internal Medicine

## 2023-12-14 DIAGNOSIS — Z794 Long term (current) use of insulin: Secondary | ICD-10-CM

## 2023-12-15 NOTE — Telephone Encounter (Signed)
 Requested by interface surescripts. Future visit 03/14/24. Requested Prescriptions  Pending Prescriptions Disp Refills   metFORMIN  (GLUCOPHAGE -XR) 500 MG 24 hr tablet [Pharmacy Med Name: metFORMIN  HCl ER 500 MG Oral Tablet Extended Release 24 Hour] 270 tablet 0    Sig: TAKE 3 TABLETS BY MOUTH ONCE DAILY WITH BREAKFAST (MUST  KEEP  UPCOMING  APPOINTMENT  FOR  ADDITIONAL  REFILLS)     Endocrinology:  Diabetes - Biguanides Failed - 12/15/2023  2:49 PM      Failed - B12 Level in normal range and within 720 days    No results found for: VITAMINB12       Failed - CBC within normal limits and completed in the last 12 months    WBC  Date Value Ref Range Status  08/31/2023 9.9 3.8 - 10.8 Thousand/uL Final   RBC  Date Value Ref Range Status  08/31/2023 4.90 3.80 - 5.10 Million/uL Final   Hemoglobin  Date Value Ref Range Status  08/31/2023 13.3 11.7 - 15.5 g/dL Final   HCT  Date Value Ref Range Status  08/31/2023 42.5 35.0 - 45.0 % Final   MCHC  Date Value Ref Range Status  08/31/2023 31.3 (L) 32.0 - 36.0 g/dL Final    Comment:    For adults, a slight decrease in the calculated MCHC value (in the range of 30 to 32 g/dL) is most likely not clinically significant; however, it should be interpreted with caution in correlation with other red cell parameters and the patient's clinical condition.    [facility]  Date Value Ref Range Status  08/31/2023 27.1 27.0 - 33.0 pg Final   MCV  Date Value Ref Range Status  08/31/2023 86.7 80.0 - 100.0 fL Final   No results found for: PLTCOUNTKUC, LABPLAT, POCPLA RDW  Date Value Ref Range Status  08/31/2023 13.2 11.0 - 15.0 % Final         Passed - Cr in normal range and within 360 days    Creat  Date Value Ref Range Status  08/31/2023 0.63 0.60 - 1.00 mg/dL Final   Creatinine, Urine  Date Value Ref Range Status  12/05/2022 57 20 - 275 mg/dL Final         Passed - HBA1C is between 0 and 7.9 and within 180 days    Hgb A1c MFr Bld   Date Value Ref Range Status  08/31/2023 7.6 (H) <5.7 % Final    Comment:    For someone without known diabetes, a hemoglobin A1c value of 6.5% or greater indicates that they may have  diabetes and this should be confirmed with a follow-up  test. . For someone with known diabetes, a value <7% indicates  that their diabetes is well controlled and a value  greater than or equal to 7% indicates suboptimal  control. A1c targets should be individualized based on  duration of diabetes, age, comorbid conditions, and  other considerations. . Currently, no consensus exists regarding use of hemoglobin A1c for diagnosis of diabetes for children. .          Passed - eGFR in normal range and within 360 days    GFR, Est African American  Date Value Ref Range Status  06/10/2018 91 > OR = 60 mL/min/1.56m2 Final   GFR, Est Non African American  Date Value Ref Range Status  06/10/2018 79 > OR = 60 mL/min/1.55m2 Final   eGFR  Date Value Ref Range Status  08/31/2023 95 > OR = 60 mL/min/1.22m2 Final  Passed - Valid encounter within last 6 months    Recent Outpatient Visits           3 months ago Encounter for general adult medical examination with abnormal findings   [facility] [place], Angeline ORN, [place]

## 2023-12-27 ENCOUNTER — Other Ambulatory Visit: Payer: Self-pay | Admitting: Internal Medicine

## 2023-12-27 DIAGNOSIS — Z794 Long term (current) use of insulin: Secondary | ICD-10-CM

## 2023-12-27 DIAGNOSIS — E1169 Type 2 diabetes mellitus with other specified complication: Secondary | ICD-10-CM

## 2023-12-28 NOTE — Telephone Encounter (Signed)
 Requested Prescriptions  Pending Prescriptions Disp Refills   atorvastatin  (LIPITOR) 40 MG tablet [Pharmacy Med Name: Atorvastatin  Calcium  40 MG Oral Tablet] 90 tablet 2    Sig: Take 1 tablet by mouth once daily     Cardiovascular:  Antilipid - Statins Failed - 12/28/2023  5:25 PM      Failed - Lipid Panel in normal range within the last 12 months    Cholesterol  Date Value Ref Range Status  08/31/2023 116 <200 mg/dL Final   LDL Cholesterol (Calc)  Date Value Ref Range Status  08/31/2023 42 mg/dL (calc) Final    Comment:    Reference range: <100 . Desirable range <100 mg/dL for primary prevention;   <70 mg/dL for patients with CHD or diabetic patients  with > or = 2 CHD risk factors. SABRA LDL-C is now calculated using the Martin-Hopkins  calculation, which is a validated novel method providing  better accuracy than the Friedewald equation in the  estimation of LDL-C.  Gladis APPLETHWAITE et al. SANDREA. 7986;689(80): 2061-2068  (http://education.[facility].com/faq/FAQ164)    HDL  Date Value Ref Range Status  08/31/2023 56 > OR = 50 mg/dL Final   Triglycerides  Date Value Ref Range Status  08/31/2023 96 <150 mg/dL Final         Passed - Patient is not pregnant      Passed - Valid encounter within last 12 months    Recent Outpatient Visits           3 months ago Encounter for general adult medical examination with abnormal findings   [facility] [place], Angeline ORN, NP

## 2024-01-04 ENCOUNTER — Ambulatory Visit: Payer: Self-pay

## 2024-01-04 NOTE — Telephone Encounter (Signed)
 FYI Only or Action Required?: FYI only for provider: appointment scheduled on 01/05/24.  Patient was last seen in primary care on 08/31/2023 by Antonette Angeline ORN, NP.  Called Nurse Triage reporting Neck Pain.  Symptoms began about a month ago.  Interventions attempted: Rest, hydration, or home remedies and Other: Chiropractor .  Symptoms are: unchanged.  Triage Disposition: See PCP When Office is Open (Within 3 Days)  Patient/caregiver understands and will follow disposition?: Yes     Copied from CRM [id number]. Topic: Clinical - Red Word Triage >> Jan 04, 2024  9:18 AM Summer Young wrote: [facility] that prompted transfer to Nurse Triage: Shoulder, back, and neck pain ( right side of pain ) putting on clothes, showering, turning neck having issues.. right hand gets tired Reason for Disposition  [1] MODERATE neck pain (e.Young., interferes with normal activities) AND [2] present > 3 days  Answer Assessment - Initial Assessment Questions Additional info:  Calling to schedule follow up with pcp for persistent neck, back, shoulder pain. Chiropractor was not helpful. Injection helpful but no longer effective.    1. ONSET: When did the pain begin?      Chronic for one month 2. LOCATION: Where does it hurt?      Right side  3. PATTERN Does the pain come and go, or has it been constant since it started?      Constant, intensity varies  4. SEVERITY: How bad is the pain?  (Scale 0-10; or none or slight stiffness, mild, moderate, severe)     6/10 5. RADIATION: Does the pain go anywhere else, shoot into your arms?     Denies 6. CORD SYMPTOMS: Any weakness or numbness of the arms or legs?     Right hand weakness-started 1-2 weeks ago 7. CAUSE: What do you think is causing the neck pain?     Unsure 8. NECK OVERUSE: Any recent activities that involved turning or twisting the neck?     Worse when turning head, can hear popping noise in spine when rotating neck 9. OTHER SYMPTOMS: Do  you have any other symptoms? (e.Young., headache, fever, chest pain, difficulty breathing, neck swelling)     Right arm tingling intermittently, right shoulder pain. 5 days ago lightheaded upon standing that resolved quickly.  Protocols used: Neck Pain or Stiffness-A-AH

## 2024-01-05 ENCOUNTER — Ambulatory Visit (INDEPENDENT_AMBULATORY_CARE_PROVIDER_SITE_OTHER): Admitting: Internal Medicine

## 2024-01-05 ENCOUNTER — Encounter: Payer: Self-pay | Admitting: Internal Medicine

## 2024-01-05 VITALS — BP 130/78 | Ht 61.0 in | Wt 158.2 lb

## 2024-01-05 DIAGNOSIS — M542 Cervicalgia: Secondary | ICD-10-CM | POA: Diagnosis not present

## 2024-01-05 DIAGNOSIS — M5412 Radiculopathy, cervical region: Secondary | ICD-10-CM

## 2024-01-05 DIAGNOSIS — G8929 Other chronic pain: Secondary | ICD-10-CM

## 2024-01-05 DIAGNOSIS — M25511 Pain in right shoulder: Secondary | ICD-10-CM | POA: Diagnosis not present

## 2024-01-05 NOTE — Telephone Encounter (Signed)
Will discuss at upcoming appointment today 

## 2024-01-05 NOTE — Progress Notes (Signed)
 Subjective:    Patient ID: Summer Young, female    DOB: 10/22/1950, 73 y.o.   MRN: [id number]  HPI  Discussed the use of AI scribe software for clinical note transcription with the patient, who gave verbal consent to proceed.  Summer Young is a 73 year old female who presents with worsening chronic neck and right shoulder pain.  She has experienced chronic neck and right shoulder pain for over a year, with recent worsening of symptoms. The pain is severe, causing headaches and a bulging sensation in the neck area, and is persistent, often preventing her from sleeping, especially at night.  An MRI of the right shoulder in February 2025 revealed rotator cuff tendinopathy in the infraspinatus, with fissuring and small interstitial tears in the distal tendon, mild tendinosis of the long head of the biceps tendon, moderate AC joint arthritis, adhesive capsulitis, and findings suggestive of a tear of the anterior inferior labrum. Despite these findings, she reports that the shoulder is not the primary source of her current discomfort.  An MRI of the cervical spine in February 2025 showed spondylosis from C7 to T1 with moderate to moderately severe foraminal narrowing. She experiences pain radiating from her neck to her right arm, with associated numbness, tingling, and cold sensations in her right hand, which she describes as 'falling asleep'.  She tried chiropractic treatment approximately a year and a half ago, which worsened her condition. She is currently taking Tylenol 650 mg as needed for pain, which provides some relief. She previously tried a medication she refers to as 'capsaicin, which caused adverse effects such as feeling high and nausea.  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 She is concerned about her ability to maintain her business due to immigration issues affecting her helpers.        Review of Systems     Past Medical History:  Diagnosis Date   Allergy    Chronic pain of both knees    Diabetes mellitus without complication (HCC)    Hyperlipidemia    Hypertension     Current Outpatient Medications  Medication Sig Dispense Refill   acetaminophen (TYLENOL) 650 MG CR tablet Take 650 mg by mouth 2 (two) times daily as needed for pain.     alendronate  (FOSAMAX ) 70 MG tablet TAKE 1 TABLET BY MOUTH ONCE A WEEK WITH  A  FULL  GLASS  OF  WATER  ON  AN  EMPTY  STOMACH 12 tablet 0   atorvastatin  (LIPITOR) 40 MG tablet Take 1 tablet by mouth once daily 90 tablet 2   blood glucose meter kit and supplies Dispense based on patient and insurance preference. Use up to four times daily as directed. (FOR ICD-10 E10.9, E11.9). 1 each 0   Blood Glucose Monitoring Suppl (ONETOUCH VERIO) w/Device KIT Use to check blood sugar as directed.  DX: E11.65 1 kit 0   Cholecalciferol (VITAMIN D3) 50 MCG (2000 UT) capsule Take 1,000 Units by mouth daily.      Continuous Glucose Sensor (FREESTYLE LIBRE 3 PLUS SENSOR) MISC Place 1 sensor on the skin every 15 days. Use to check glucose continuously 2 each 12   Diclofenac  Sodium 3 % GEL Apply 2-3 gm topically, 4 times daily.     EPINEPHrine  0.3 mg/0.3 mL IJ SOAJ injection Inject 0.3 mg into the muscle as needed for anaphylaxis. 1 each 1   fluticasone  (FLONASE )  50 MCG/ACT nasal spray Place 2 sprays into both nostrils daily. Use for 4-6 weeks then stop and use seasonally or as needed. 16 g 0   insulin  degludec (TRESIBA  FLEXTOUCH) 100 UNIT/ML FlexTouch Pen Inject 26 Units into the skin daily. (Patient taking differently: Inject 18 Units into the skin daily.) 15 mL 0   metFORMIN  (GLUCOPHAGE -XR) 500 MG 24 hr tablet TAKE 3 TABLETS BY MOUTH ONCE DAILY WITH BREAKFAST (MUST  KEEP  UPCOMING  APPOINTMENT  FOR  ADDITIONAL  REFILLS) 270 tablet 0   naproxen  (NAPROSYN ) 500 MG tablet Take 1 tablet (500 mg total) by mouth 2 (two) times daily with a meal. 14 tablet 0    omeprazole  (PRILOSEC) 40 MG capsule TAKE 1 CAPSULE BY MOUTH ONCE DAILY BEFORE BREAKFAST 90 capsule 0   OneTouch Delica Lancets 33G MISC Use to check blood sugar as directed.  DX: E11.65 100 each 1   ONETOUCH ULTRA test strip Check blood sugar 1 x daily as directed 100 each 12   OZEMPIC , 1 MG/DOSE, 4 MG/3ML SOPN INJECT 1MG  INTO THE SKIN ONCE WEEKLY 3 mL 0   No current facility-administered medications for this visit.    Allergies  Allergen Reactions   Aspirin Hives    Other reaction(s): Other (See Comments) Other Reaction: Not Assessed    Insulin  Detemir Hives and Rash    Errythema, edema, heat at site of injection did not improve after 2 weeks of use.     Shellfish Allergy Anaphylaxis   Dapagliflozin Other (See Comments)    Yeast infections  Yeast infection  Farxiga   Influenza Vaccines Other (See Comments)    Forxiga   Other     Other reaction(s): Other (See Comments) Uncoded Allergy. Allergen: Shellfish, Other Reaction: Not Assessed    Flu vaccine given 1998 caused anaphylaxis    Family History  Problem Relation Age of Onset   Diabetes Mother    Diabetes Father    Stroke Father    Diabetes Sister    Heart attack Other    Diabetes Sister    Diabetes Sister    Diabetes Sister     Social History   Socioeconomic History   Marital status: Married    Spouse name: Reynoso,Eduardo   Number of children: Not on file   Years of education: Not on file   Highest education level: High school graduate  Occupational History   Occupation: housekeeper    Comment: self-employed   Occupation: Self- Employed  Tobacco Use   Smoking status: Never   Smokeless tobacco: Never  Vaping Use   Vaping status: Never Used  Substance and Sexual Activity   Alcohol use: Yes    Alcohol/week: 0.0 - 1.0 standard drinks of alcohol    Comment: occasional   Drug use: Never   Sexual activity: Yes    Birth control/protection: None  Other Topics Concern   Not on file  Social History  Narrative   Not on file   Social Drivers of Health   Financial Resource Strain: Low Risk  (09/03/2023)   Overall Financial Resource Strain (CARDIA)    Difficulty of Paying Living Expenses: Not hard at all  Food Insecurity: No Food Insecurity (09/03/2023)   Hunger Vital Sign    Worried About Running Out of Food in the Last Year: Never true    Ran Out of Food in the Last Year: Never true  Transportation Needs: No Transportation Needs (09/03/2023)   PRAPARE - Administrator, Civil Service (Medical):  No    Lack of Transportation (Non-Medical): No  Physical Activity: Inactive (09/03/2023)   Exercise Vital Sign    Days of Exercise per Week: 0 days    Minutes of Exercise per Session: 0 min  Stress: No Stress Concern Present (09/03/2023)   [employer] of Occupational Health - Occupational Stress Questionnaire    Feeling of Stress: Not at all  Social Connections: Moderately Integrated (09/03/2023)   Social Connection and Isolation Panel    Frequency of Communication with Friends and Family: More than three times a week    Frequency of Social Gatherings with Friends and Family: More than three times a week    Attends Religious Services: More than 4 times per year    Active Member of [employer] or Organizations: Yes    Attends Banker Meetings: More than 4 times per year    Marital Status: Separated  Intimate Partner Violence: Not At Risk (09/03/2023)   Humiliation, Afraid, Rape, and Kick questionnaire    Fear of Current or Ex-Partner: No    Emotionally Abused: No    Physically Abused: No    Sexually Abused: No     Constitutional: Denies fever, malaise, fatigue, headache or abrupt weight changes.  Respiratory: Denies difficulty breathing, shortness of breath, cough or sputum production.   Cardiovascular: Denies chest pain, chest tightness, palpitations or swelling in the hands or feet.  Musculoskeletal: Patient reports joint pain (neck, back and shoulders).  Denies  decrease in range of motion, difficulty with gait, muscle pain or joint swelling.  Skin: Denies redness, rashes, lesions or ulcercations.  Neurological: Patient reports paresthesia right upper extremity.  Denies dizziness, difficulty with memory, difficulty with speech or problems with balance and coordination.    No other specific complaints in a complete review of systems (except as listed in HPI above).  Objective:   Physical Exam BP 130/78 (BP Location: Left Arm, Patient Position: Sitting, Cuff Size: Normal)   Ht 5' 1 (1.549 m)   Wt 158 lb 3.2 oz (71.8 kg)   BMI 29.89 kg/m     Wt Readings from Last 3 Encounters:  09/03/23 156 lb (70.8 kg)  08/31/23 156 lb 6.4 oz (70.9 kg)  03/16/23 162 lb 6.4 oz (73.7 kg)    General: Appears her stated age, overweight, in NAD. Cardiovascular: Normal rate and rhythm.  Radial pulse 2+ bilaterally. Pulmonary/Chest: Normal effort and positive vesicular breath sounds. No respiratory distress. No wheezes, rales or ronchi noted.  Musculoskeletal: Normal flexion, rotation to the left of the cervical spine.  Decreased extension and rotation of the right of cervical spine.  Bony tenderness noted over the lower cervical spine and right paracervical muscles.  Shoulder shrug equal but weak bilaterally.  Strength 5/5 BUE.  Hand grips equal.  No difficulty with gait.  Neurological: Alert and oriented. Coordination normal.     BMET    Component Value Date/Time   NA 142 08/31/2023 1330   K 4.5 08/31/2023 1330   CL 106 08/31/2023 1330   CO2 26 08/31/2023 1330   GLUCOSE 89 08/31/2023 1330   BUN 10 08/31/2023 1330   CREATININE 0.63 08/31/2023 1330   CALCIUM  9.7 08/31/2023 1330   GFRNONAA 79 06/10/2018 0824   GFRAA 91 06/10/2018 0824    Lipid Panel     Component Value Date/Time   CHOL 116 08/31/2023 1330   TRIG 96 08/31/2023 1330   HDL 56 08/31/2023 1330   CHOLHDL 2.1 08/31/2023 1330   LDLCALC 42 08/31/2023 1330  CBC    Component Value  Date/Time   WBC 9.9 08/31/2023 1330   RBC 4.90 08/31/2023 1330   HGB 13.3 08/31/2023 1330   HCT 42.5 08/31/2023 1330   PLT 353 08/31/2023 1330   MCV 86.7 08/31/2023 1330   MCH 27.1 08/31/2023 1330   MCHC 31.3 (L) 08/31/2023 1330   RDW 13.2 08/31/2023 1330   LYMPHSABS 2,967 04/12/2019 0818   EOSABS 95 04/12/2019 0818   BASOSABS 34 04/12/2019 0818    Hgb A1C Lab Results  Component Value Date   HGBA1C 7.6 (H) 08/31/2023            Assessment & Plan:   Assessment and Plan    Cervical spondylosis with radiculopathy 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 Previous chiropractic treatment worsened symptoms. Tylenol provides partial relief. - Continue Tylenol 650 mg every 8 hours as needed for pain. - Referred to [facility] for evaluation and potential neck injection. - Provided contact information for Dr. Lynwood Brain at [facility].  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 Previous shoulder injection significantly improved symptoms and range of motion. Persistent pain, especially at night, and movement difficulty remain. - Continue current management as shoulder injection provided relief. - Referred to [facility] for further evaluation and management.        RTC in 2 months, follow-up chronic conditions Angeline Laura, NP

## 2024-01-05 NOTE — Patient Instructions (Signed)
 Neck Exercises Ask your health care provider which exercises are safe for you. Do exercises exactly as told by your health care provider and adjust them as directed. It is normal to feel mild stretching, pulling, tightness, or discomfort as you do these exercises. Stop right away if you feel sudden pain or your pain gets worse. Do not begin these exercises until told by your health care provider. Neck exercises can be important for many reasons. They can improve strength and maintain flexibility in your neck, which will help your upper back and prevent neck pain. Stretching exercises Rotation neck stretching  Sit in a chair or stand up. Place your feet flat on the floor, shoulder-width apart. Slowly turn your head (rotate) to the right until a slight stretch is felt. Turn it all the way to the right so you can look over your right shoulder. Do not tilt or tip your head. Hold this position for 10-30 seconds. Slowly turn your head (rotate) to the left until a slight stretch is felt. Turn it all the way to the left so you can look over your left shoulder. Do not tilt or tip your head. Hold this position for 10-30 seconds. Repeat __________ times. Complete this exercise __________ times a day. Neck retraction  Sit in a sturdy chair or stand up. Look straight ahead. Do not bend your neck. Use your fingers to push your chin backward (retraction). Do not bend your neck for this movement. Continue to face straight ahead. If you are doing the exercise properly, you will feel a slight sensation in your throat and a stretch at the back of your neck. Hold the stretch for 1-2 seconds. Repeat __________ times. Complete this exercise __________ times a day. Strengthening exercises Neck press  Lie on your back on a firm bed or on the floor with a pillow under your head. Use your neck muscles to push your head down on the pillow and straighten your spine. Hold the position as well as you can. Keep your head  facing up (in a neutral position) and your chin tucked. Slowly count to 5 while holding this position. Repeat __________ times. Complete this exercise __________ times a day. Isometrics These are exercises in which you strengthen the muscles in your neck while keeping your neck still (isometrics). Sit in a supportive chair and place your hand on your forehead. Keep your head and face facing straight ahead. Do not flex or extend your neck while doing isometrics. Push forward with your head and neck while pushing back with your hand. Hold for 10 seconds. Do the sequence again, this time putting your hand against the back of your head. Use your head and neck to push backward against the hand pressure. Finally, do the same exercise on either side of your head, pushing sideways against the pressure of your hand. Repeat __________ times. Complete this exercise __________ times a day. Prone head lifts  Lie face-down (prone position), resting on your elbows so that your chest and upper back are raised. Start with your head facing downward, near your chest. Position your chin either on or near your chest. Slowly lift your head upward. Lift until you are looking straight ahead. Then continue lifting your head as far back as you can comfortably stretch. Hold your head up for 5 seconds. Then slowly lower it to your starting position. Repeat __________ times. Complete this exercise __________ times a day. Supine head lifts  Lie on your back (supine position), bending your knees  to point to the ceiling and keeping your feet flat on the floor. Lift your head slowly off the floor, raising your chin toward your chest. Hold for 5 seconds. Repeat __________ times. Complete this exercise __________ times a day. Scapular retraction  Stand with your arms at your sides. Look straight ahead. Slowly pull both shoulders (scapulae) backward and downward (retraction) until you feel a stretch between your shoulder  blades in your upper back. Hold for 10-30 seconds. Relax and repeat. Repeat __________ times. Complete this exercise __________ times a day. Contact a health care provider if: Your neck pain or discomfort gets worse when you do an exercise. Your neck pain or discomfort does not improve within 2 hours after you exercise. If you have any of these problems, stop exercising right away. Do not do the exercises again unless your health care provider says that you can. Get help right away if: You develop sudden, severe neck pain. If this happens, stop exercising right away. Do not do the exercises again unless your health care provider says that you can. This information is not intended to replace advice given to you by your health care provider. Make sure you discuss any questions you have with your health care provider. Document Revised: 07/31/2020 Document Reviewed: 07/31/2020 Elsevier Patient Education  2024 [employer].

## 2024-01-26 ENCOUNTER — Other Ambulatory Visit: Payer: Self-pay | Admitting: Internal Medicine

## 2024-01-26 ENCOUNTER — Other Ambulatory Visit: Payer: Self-pay

## 2024-01-26 DIAGNOSIS — M810 Age-related osteoporosis without current pathological fracture: Secondary | ICD-10-CM

## 2024-01-26 MED ORDER — ALENDRONATE SODIUM 70 MG PO TABS: 70.0000 mg | ORAL_TABLET | ORAL | 0 refills | Status: AC

## 2024-01-28 NOTE — Telephone Encounter (Signed)
 Duplicate request, refilled 01/26/24.  Requested Prescriptions  Pending Prescriptions Disp Refills   alendronate  (FOSAMAX ) 70 MG tablet [Pharmacy Med Name: Alendronate  Sodium 70 MG Oral Tablet] 12 tablet 0    Sig: TAKE 1 TABLET BY MOUTH ONCE A WEEK WITH  A  FULL  GLASS  OF  WATER  ON  AN  EMPTY  STOMACH     Endocrinology:  Bisphosphonates Failed - 01/28/2024  9:20 AM      Failed - Vitamin D in normal range and within 360 days    No results found for: CI7874NY7, CI6874NY7, CI874NY7UNU, 25OHVITD3, 25OHVITD2, 25OHVITD1, VD25OH       Failed - Mg Level in normal range and within 360 days    No results found for: MG       Failed - Phosphate in normal range and within 360 days    No results found for: PHOS       Passed - Ca in normal range and within 360 days    Calcium   Date Value Ref Range Status  08/31/2023 9.7 8.6 - 10.4 mg/dL Final         Passed - Cr in normal range and within 360 days    Creat  Date Value Ref Range Status  08/31/2023 0.63 0.60 - 1.00 mg/dL Final   Creatinine, Urine  Date Value Ref Range Status  12/05/2022 57 20 - 275 mg/dL Final         Passed - eGFR is 30 or above and within 360 days    GFR, Est African American  Date Value Ref Range Status  06/10/2018 91 > OR = 60 mL/min/1.75m2 Final   GFR, Est Non African American  Date Value Ref Range Status  06/10/2018 79 > OR = 60 mL/min/1.59m2 Final   eGFR  Date Value Ref Range Status  08/31/2023 95 > OR = 60 mL/min/1.15m2 Final         Passed - Valid encounter within last 12 months    Recent Outpatient Visits           3 weeks ago Chronic right shoulder pain   [place] [facility] [place], Angeline ORN, NP   5 months ago Encounter for general adult medical examination with abnormal findings   [place] [facility] [place], Angeline ORN, NP              Passed - Bone Mineral Density or Dexa Scan completed in the last 2 years

## 2024-02-01 ENCOUNTER — Other Ambulatory Visit: Admitting: Pharmacist

## 2024-02-01 DIAGNOSIS — Z794 Long term (current) use of insulin: Secondary | ICD-10-CM

## 2024-02-01 DIAGNOSIS — E1165 Type 2 diabetes mellitus with hyperglycemia: Secondary | ICD-10-CM

## 2024-02-01 NOTE — Patient Instructions (Signed)
 Goals Addressed             This Visit's Progress    Pharmacy Goals       Our goal A1c is less than 7%. This corresponds with fasting sugars less than 130 and 2 hour after meal sugars less than 180. Please check your blood sugars and keep log of results  Our goal bad cholesterol, or LDL, is less than 70 . This is why it is important to continue taking your atorvastatin   Feel free to call me with any questions or concerns. I look forward to our next call!  Estelle Grumbles, PharmD, [facility] Clinical Pharmacist [facility] [phone]

## 2024-02-01 NOTE — Progress Notes (Signed)
 02/01/2024 Name: Summer Young MRN: [id number] DOB: 1950/10/27  Chief Complaint  Patient presents with   Medication Management   Medication Assistance   Medication Adherence    Summer Young is a 73 y.o. year old female who presented for a telephone visit.   They were referred to the pharmacist by their PCP for assistance in managing diabetes, hypertension, hyperlipidemia, and medication access.      Subjective:   Care Team: Primary Care Provider: Antonette Angeline ORN, NP ; Next Scheduled Visit: 03/14/2024 Orthopedic Surgeon: Edie Norleen Purchase, MD  Medication Access/Adherence  Current Pharmacy:  [facility] [address], [place] - [address] [place] [address] Phone: [phone] Fax: [phone]   Patient reports affordability concerns with their medications: No  Patient reports access/transportation concerns to their pharmacy: No  Patient reports adherence concerns with their medications:  No     Denies using weekly pillbox, but reports has her own strategy (keeping track of when takes her medications)  Reports enrolled in [employer] Medicare Advantage [facility] [place] for 2026 calendar year     Type 2 Diabetes:   Current medications:  Tresiba  18 units once daily Metformin  ER 500 mg - 3 tablets daily with breakfast Ozempic  1 mg once weekly   Patient using Freestyle Libre 3 Plus continuous blood glucose monitor (CGM) Using her iPhone in place of reader devices Review results from Freestyle LibreView:   Date of Download: 02/01/2024 % Time CGM is active: 89% Average Glucose: 148 mg/dL Glucose Management Indicator: 6.9%  Glucose Variability: 30% (goal <36%) Time in Goal:  - Time in range 70-180: 78% - Time above range: 22% - Time below range: 0% Observed patterns: - Noticed a very high post meal reading one day related to eating a peach + 1/2 banana + rice and then another day elevated related to drinking soda         Current Dietary Habits: - Breakfast: Cheerios or 1/2 whole wheat bagel with an egg - Lunch: vegetable soup or salad or sandwich - Supper: chicken or beef with tortilla - Drinks: water + hot tea - Bedtime snack: peanuts + apple   Denies recent signs of hypoglycemia Note patient carries glucose tablets with her     Statin: atorvastatin  40 mg daily   Current medication access support: Enrolled in Tresiba  and Ozempic  medication assistance from [employer] through 02/17/2024 [employer] has announced that they will no longer offer Ozempic  to [employer] beneficiaries through their Patient Assistance Program in 2026. Patient aware and plans to obtain through her insurance plan in 2026    Objective:  Lab Results  Component Value Date   HGBA1C 7.6 (H) 08/31/2023    Lab Results  Component Value Date   CREATININE 0.63 08/31/2023   BUN 10 08/31/2023   NA 142 08/31/2023   K 4.5 08/31/2023   CL 106 08/31/2023   CO2 26 08/31/2023    Lab Results  Component Value Date   CHOL 116 08/31/2023   HDL 56 08/31/2023   LDLCALC 42 08/31/2023   TRIG 96 08/31/2023   CHOLHDL 2.1 08/31/2023    Medications Reviewed Today     Reviewed by Alana Sharyle LABOR, RPH-CPP (Pharmacist) on 02/01/24 at [phone]  Med List Status: <None>   Medication Order Taking? Sig Documenting Provider Last Dose Status Informant  acetaminophen (TYLENOL) 650 MG CR tablet [id number]  Take 650 mg by mouth 2 (two) times daily as needed for pain. [provider]  Active  alendronate  (FOSAMAX ) 70 MG tablet [id number]  Take 1 tablet (70 mg total) by mouth once a week. Take with a full glass of water on an empty stomach. Antonette Angeline ORN, NP  Active   atorvastatin  (LIPITOR) 40 MG tablet [id number] Yes Take 1 tablet by mouth once daily Antonette Angeline ORN, NP  Active   blood glucose meter kit and supplies [id number]  Dispense based on patient and insurance preference. Use up to four times daily as directed. (FOR ICD-10 E10.9,  E11.9). Fredrik Nat HERO, FNP  Active   Blood Glucose Monitoring Suppl [facility] VERIO) w/Device KIT [id number]  Use to check blood sugar as directed.  DX: E11.65 Antonette Angeline ORN, NP  Active   Cholecalciferol (VITAMIN D3) 50 MCG (2000 UT) capsule [id number]  Take 1,000 Units by mouth daily.  [provider]  Active   Continuous Glucose Sensor (FREESTYLE LIBRE 3 PLUS SENSOR) MISC [id number]  Place 1 sensor on the skin every 15 days. Use to check glucose continuously Antonette Angeline ORN, NP  Active   Diclofenac  Sodium 3 % GEL [id number]  Apply 2-3 gm topically, 4 times daily. [provider]  Active   EPINEPHrine  0.3 mg/0.3 mL IJ SOAJ injection [id number]  Inject 0.3 mg into the muscle as needed for anaphylaxis. Antonette Angeline ORN, NP  Active   fluticasone  (FLONASE ) 50 MCG/ACT nasal spray [id number]  Place 2 sprays into both nostrils daily. Use for 4-6 weeks then stop and use seasonally or as needed. Antonette Angeline ORN, NP  Active   insulin  degludec (TRESIBA  FLEXTOUCH) 100 UNIT/ML FlexTouch Pen [id number]  Inject 26 Units into the skin daily.  Patient taking differently: Inject 18 Units into the skin daily.   Antonette Angeline ORN, NP  Active   metFORMIN  (GLUCOPHAGE -XR) 500 MG 24 hr tablet [id number] Yes TAKE 3 TABLETS BY MOUTH ONCE DAILY WITH BREAKFAST (MUST  KEEP  UPCOMING  APPOINTMENT  FOR  ADDITIONAL  REFILLS) Antonette Angeline ORN, NP  Active   omeprazole  (PRILOSEC) 40 MG capsule [id number]  TAKE 1 CAPSULE BY MOUTH ONCE DAILY BEFORE BREAKFAST Antonette Angeline ORN, NP  Active   OneTouch Delica Lancets 33G [place] [id number]  Use to check blood sugar as directed.  DX: E11.65 Antonette Angeline ORN, NP  Active   [facility] ULTRA test strip [id number]  Check blood sugar 1 x daily as directed Edman Marsa PARAS, DO  Active   OZEMPIC , 1 MG/DOSE, 4 MG/3ML NELMA [id number] Yes INJECT 1MG  INTO THE SKIN ONCE WEEKLY Antonette Angeline ORN, NP  Active   SODIUM FLUORIDE 5000 ENAMEL 1.1-5 % GEL [id number]  2 (two) times daily. [provider]  Active               Assessment/Plan:   Per review of [employer], for patient's [employer]ARP Medicare Advantage plan in 2026, she will have a $440 annual deductible (impacting tiers 3-5) and her tier 3 copayment (such as for Ozempic ) will be 18% coinsurance  Diabetes: - Encourage patient to get back to having regular well-balanced meals and snacks, while controlling carbohydrate portion sizes Encouraged patient to continue consistent planned carbohydrate + protein snack (such as crackers/apple + peanut butter) at bedtime  Remind to review nutrition labels for total carbohydrate content of foods - Have reviewed counseling on symptoms of/how to manage low blood sugar - Patient to use CGM as feedback on dietary choices/habits to aid with glucose control.  Patient to follow up with office if needed for readings outside of established parameters or  symptoms, particularly if has any further low blood sugar readings  - Patient to contact [employer] as needed for refills of Tresiba  - Collaborating with [facility] [name] to request that she support patient with re-enrollment in [employer] patient assistance for Tresiba  and pen needles     Follow Up Plan: Clinic Pharmacist will follow up with patient by telephone on 04/15/2024 at 8:30 AM     [name], PharmD, [facility] Clinical Pharmacist [facility] [phone]

## 2024-03-02 ENCOUNTER — Ambulatory Visit (INDEPENDENT_AMBULATORY_CARE_PROVIDER_SITE_OTHER): Admitting: Family Medicine

## 2024-03-02 ENCOUNTER — Ambulatory Visit: Payer: Self-pay

## 2024-03-02 ENCOUNTER — Encounter: Payer: Self-pay | Admitting: Family Medicine

## 2024-03-02 VITALS — BP 120/74 | HR 85 | Temp 99.0°F | Ht 61.0 in | Wt 158.0 lb

## 2024-03-02 DIAGNOSIS — R6889 Other general symptoms and signs: Secondary | ICD-10-CM

## 2024-03-02 DIAGNOSIS — J069 Acute upper respiratory infection, unspecified: Secondary | ICD-10-CM

## 2024-03-02 LAB — POC COVID19/FLU A&B COMBO
Covid Antigen, POC: NEGATIVE
Influenza A Antigen, POC: NEGATIVE
Influenza B Antigen, POC: NEGATIVE

## 2024-03-02 NOTE — Patient Instructions (Addendum)
 Thank you for coming to the office today.  COVID FLU negative today  Upper Respiratory Infection, Adult An upper respiratory infection (URI) is a common viral infection of the nose, throat, and upper air passages that lead to the lungs. The most common type of URI is the common cold. URIs usually get better on their own, without medical treatment. What are the causes? A URI is caused by a virus. You may catch a virus by: Breathing in droplets from an infected person's cough or sneeze. Touching something that has been exposed to the virus (is contaminated) and then touching your mouth, nose, or eyes. What increases the risk? You are more likely to get a URI if: You are very young or very old. You have close contact with others, such as at work, school, or a health care facility. You smoke. You have long-term (chronic) heart or lung disease. You have a weakened disease-fighting system (immune system). You have nasal allergies or asthma. You are experiencing a lot of stress. You have poor nutrition. What are the signs or symptoms? A URI usually involves some of the following symptoms: Runny or stuffy (congested) nose. Cough. Sneezing. Sore throat. Headache. Fatigue. Fever. Loss of appetite. Pain in your forehead, behind your eyes, and over your cheekbones (sinus pain). Muscle aches. Redness or irritation of the eyes. Pressure in the ears or face. How is this diagnosed? This condition may be diagnosed based on your medical history and symptoms, and a physical exam. Your health care provider may use a swab to take a mucus sample from your nose (nasal swab). This sample can be tested to determine what virus is causing the illness. How is this treated? URIs usually get better on their own within 7-10 days. Medicines cannot cure URIs, but your health care provider may recommend certain medicines to help relieve symptoms, such as: Over-the-counter cold medicines. Cough suppressants.  Coughing is a type of defense against infection that helps to clear the respiratory system, so take these medicines only as recommended by your health care provider. Fever-reducing medicines. Follow these instructions at home: Activity Rest as needed. If you have a fever, stay home from work or school until your fever is gone or until your health care provider says your URI cannot spread to other people (is no longer contagious). Your health care provider may have you wear a face mask to prevent your infection from spreading. Relieving symptoms Gargle with a mixture of salt and water 3-4 times a day or as needed. To make salt water, completely dissolve -1 tsp (3-6 g) of salt in 1 cup (237 mL) of warm water. Use a cool-mist humidifier to add moisture to the air. This can help you breathe more easily. Eating and drinking  Drink enough fluid to keep your urine pale yellow. Eat soups and other clear broths. General instructions  Take over-the-counter and prescription medicines only as told by your health care provider. These include cold medicines, fever reducers, and cough suppressants. Do not use any products that contain nicotine or tobacco. These products include cigarettes, chewing tobacco, and vaping devices, such as e-cigarettes. If you need help quitting, ask your health care provider. Stay away from secondhand smoke. Stay up to date on all immunizations, including the yearly (annual) flu vaccine. Keep all follow-up visits. This is important. How to prevent the spread of infection to others URIs can be contagious. To prevent the infection from spreading: Wash your hands with soap and water for at least 20 seconds.  If soap and water are not available, use hand sanitizer. Avoid touching your mouth, face, eyes, or nose. Cough or sneeze into a tissue or your sleeve or elbow instead of into your hand or into the air.  Contact a health care provider if: You are getting worse instead of  better. You have a fever or chills. Your mucus is brown or red. You have yellow or brown discharge coming from your nose. You have pain in your face, especially when you bend forward. You have swollen neck glands. You have pain while swallowing. You have white areas in the back of your throat. Get help right away if: You have shortness of breath that gets worse. You have severe or persistent: Headache. Ear pain. Sinus pain. Chest pain. You have chronic lung disease along with any of the following: Making high-pitched whistling sounds when you breathe, most often when you breathe out (wheezing). Prolonged cough (more than 14 days). Coughing up blood. A change in your usual mucus. You have a stiff neck. You have changes in your: Vision. Hearing. Thinking. Mood. These symptoms may be an emergency. Get help right away. Call 911. Do not wait to see if the symptoms will go away. Do not drive yourself to the hospital. Summary An upper respiratory infection (URI) is a common infection of the nose, throat, and upper air passages that lead to the lungs. A URI is caused by a virus. URIs usually get better on their own within 7-10 days. Medicines cannot cure URIs, but your health care provider may recommend certain medicines to help relieve symptoms. This information is not intended to replace advice given to you by your health care provider. Make sure you discuss any questions you have with your health care provider. Document Revised: 09/05/2020 Document Reviewed: 09/05/2020 Elsevier Patient Education  2024 Elsevier Inc.  Please schedule a Follow-up Appointment to: Return if symptoms worsen or fail to improve.  If you have any other questions or concerns, please feel free to call the office or send a message through MyChart. You may also schedule an earlier appointment if necessary.  Additionally, you may be receiving a survey about your experience at our office within a few days to 1  week by e-mail or mail. We value your feedback.  Marsa Officer, DO [facility], [place]

## 2024-03-02 NOTE — Progress Notes (Signed)
 "  Subjective:    Patient ID: Summer Young, female    DOB: [birth year], 74 y.o.   MRN: [id number]  Summer Young is a 74 y.o. female presenting on 03/02/2024 for Cough (Started last Thursday cold symptoms )  Patient presents for a same day appointment.  PCP = Angeline Laura, FNP   HPI  Discussed the use of AI scribe software for clinical note transcription with the patient, who gave verbal consent to proceed.  History of Present Illness   Summer Young is a 74 year old female who presents with cold symptoms.  Viral URI Constitutional symptoms - Symptoms began on Thursday - Fever persists, detected by a greasy sensation in her mouth, not noticeable to others by touch - Loss of appetite - Headache - Dizziness, worsened with deep breathing - Cough productive of mucus. - Loss of taste - Denies dyspnea, vomiting diarrhea,  Symptom management - Using over-the-counter Tylenol and throat spray for relief - Received a concoction including Tolsura, onions, and lemon from her sister, which she believes helped with her cough - Has not used Mucinex or inhalers, prefers natural remedies        01/05/2024   10:54 AM 09/03/2023    3:12 PM 08/31/2023    1:32 PM  Depression screen PHQ 2/9  Decreased Interest 0 0 0  Down, Depressed, Hopeless 0 0 0  PHQ - 2 Score 0 0 0  Altered sleeping 0 0 0  Tired, decreased energy 1 0 0  Change in appetite 0 0 0  Feeling bad or failure about yourself  0 0 0  Trouble concentrating 1 0 0  Moving slowly or fidgety/restless 0 0 0  Suicidal thoughts 0 0 0  PHQ-9 Score 2 0  0   Difficult doing work/chores Not difficult at all Not difficult at all Not difficult at all     Data saved with a previous flowsheet row definition       01/05/2024   10:54 AM 08/31/2023    1:32 PM 03/16/2023    9:56 AM 12/05/2022   10:18 AM  GAD 7 : Generalized Anxiety Score  Nervous, Anxious, on Edge 1 0 0 0  Control/stop worrying 1 0 0 0  Worry too much - different things 1  0 1 1  Trouble relaxing 0 0 0 0  Restless 0 0 0 0  Easily annoyed or irritable 0 0 0 0  Afraid - awful might happen 0 0 0 0  Total GAD 7 Score 3 0 1 1  Anxiety Difficulty Not difficult at all Not difficult at all Somewhat difficult     Social History[1]  Review of Systems Per HPI unless specifically indicated above     Objective:    BP 120/74 (BP Location: Right Arm, Patient Position: Sitting, Cuff Size: Normal)   Pulse 85   Temp 99 F (37.2 C) (Oral)   Ht 5' 1 (1.549 m)   Wt 158 lb (71.7 kg)   SpO2 97%   BMI 29.85 kg/m   Wt Readings from Last 3 Encounters:  03/02/24 158 lb (71.7 kg)  01/05/24 158 lb 3.2 oz (71.8 kg)  09/03/23 156 lb (70.8 kg)    Physical Exam Vitals and nursing note reviewed.  Constitutional:      General: She is not in acute distress.    Appearance: She is well-developed. She is not diaphoretic.     Comments: Well-appearing, comfortable, cooperative  HENT:     Head: Normocephalic and atraumatic.  Eyes:     General:        Right eye: No discharge.        Left eye: No discharge.     Conjunctiva/sclera: Conjunctivae normal.  Neck:     Thyroid: No thyromegaly.  Cardiovascular:     Rate and Rhythm: Normal rate and regular rhythm.     Heart sounds: Normal heart sounds. No murmur heard. Pulmonary:     Effort: Pulmonary effort is normal. No respiratory distress.     Breath sounds: Normal breath sounds. No wheezing or rales.     Comments: Occasional cough Musculoskeletal:        General: Normal range of motion.     Cervical back: Normal range of motion and neck supple.  Lymphadenopathy:     Cervical: No cervical adenopathy.  Skin:    General: Skin is warm and dry.     Findings: No erythema or rash.  Neurological:     Mental Status: She is alert and oriented to person, place, and time.  Psychiatric:        Behavior: Behavior normal.     Comments: Well groomed, good eye contact, normal speech and thoughts     Results for orders placed or  performed in visit on 03/02/24  POC Covid19/Flu A&B Antigen   Collection Time: 03/02/24  3:39 PM  Result Value Ref Range   Influenza A Antigen, POC Negative Negative   Influenza B Antigen, POC Negative Negative   Covid Antigen, POC Negative Negative      Assessment & Plan:   Problem List Items Addressed This Visit   None Visit Diagnoses       Viral URI with cough    -  Primary     Flu-like symptoms       Relevant Orders   POC Covid19/Flu A&B Antigen (Completed)        Acute upper respiratory infection Symptoms improving, fever persists, upper airway involvement. COVID Flu Testing today is negative - Continue hydration and herbal remedies. She prefers to avoid rx options. - Consider Flonase  if symptoms persist. - Consider chest x-ray if symptoms worsen or do not improve.        Orders Placed This Encounter  Procedures   POC Covid19/Flu A&B Antigen    No orders of the defined types were placed in this encounter.   Follow up plan: Return if symptoms worsen or fail to improve.   Marsa Officer, DO [facility] 03/02/2024, 3:54 PM     [1]  Social History Tobacco Use   Smoking status: Never   Smokeless tobacco: Never  Vaping Use   Vaping status: Never Used  Substance Use Topics   Alcohol use: Yes    Alcohol/week: 0.0 - 1.0 standard drinks of alcohol    Comment: occasional   Drug use: Never   "

## 2024-03-02 NOTE — Telephone Encounter (Signed)
 FYI Only or Action Required?: FYI only for provider: appointment scheduled on 03/02/24.  Patient was last seen in primary care on 01/05/2024 by Antonette Angeline ORN, NP.  Called Nurse Triage reporting Headache, Dizziness, and Cough.  Symptoms began 6 days ago.  Interventions attempted: Other: Cough medicine, OTC spray for throat, Tylenol onion, lemon, garlic, honey.  Symptoms are: gradually worsening.  Triage Disposition: See PCP When Office is Open (Within 3 Days)  Patient/caregiver understands and will follow disposition?: Yes          Copied from CRM [id number]. Topic: Clinical - Red Word Triage >> Mar 02, 2024  1:08 PM Harlene ORN wrote: Red Word that prompted transfer to Nurse Triage: cough and cold symptoms since last week, but they are getting worse; congestion, blisters inside of her mouth/fever; dizziness and nausea Reason for Disposition  [1] MILD dizziness (e.g., walking normally) AND [2] has NOT been evaluated by doctor (or NP/PA) for this  (Exception: Dizziness caused by heat exposure, sudden standing, or poor fluid intake.)  Answer Assessment - Initial Assessment Questions Patient states she is tolerating PO. People in her home also sick with symptoms.   1. DESCRIPTION: Describe your dizziness.     Like a lightheaded feeling.  2. LIGHTHEADED: Do you feel lightheaded? (e.g., somewhat faint, woozy, weak upon standing)     Yes.  3. VERTIGO: Do you feel like either you or the room is spinning or tilting? (i.e., vertigo)     No.  4. SEVERITY: How bad is it?  Do you feel like you are going to faint? Can you stand and walk?     Mild. Able to stand and walk. No falls or LOC.  5. ONSET:  When did the dizziness begin?     Thursday. Intermittent/comes and goes.  6. AGGRAVATING FACTORS: Does anything make it worse? (e.g., standing, change in head position)     Standing from sitting.  7. HEART RATE: Can you tell me your heart rate? How many beats in 15  seconds?  (Note: Not all patients can do this.)       No.  8. CAUSE: What do you think is causing the dizziness? (e.g., decreased fluids or food, diarrhea, emotional distress, heat exposure, new medicine, sudden standing, vomiting; unknown)     Infection.  9. RECURRENT SYMPTOM: Have you had dizziness before? If Yes, ask: When was the last time? What happened that time?     She states this feels just like when she had COVID in 2020, except that time she had diarrhea and vomiting.  10. OTHER SYMPTOMS: Do you have any other symptoms? (e.g., fever, chest pain, vomiting, diarrhea, bleeding)       Mil headache, loss of appetite, coughing like crazy, oral blisters, sore throat, loss of taste. Blood sugar currently 144. No SOB, chest pain, unilateral numbness or weakness, changes in speech or vision, LOC, falls, requiring assistance to walk, vomiting or diarrhea. Unsure about fever, has not checked.  Protocols used: Dizziness - Lightheadedness-A-AH

## 2024-03-13 ENCOUNTER — Other Ambulatory Visit: Payer: Self-pay | Admitting: Internal Medicine

## 2024-03-13 DIAGNOSIS — E1165 Type 2 diabetes mellitus with hyperglycemia: Secondary | ICD-10-CM

## 2024-03-14 ENCOUNTER — Ambulatory Visit: Admitting: Internal Medicine

## 2024-03-14 NOTE — Telephone Encounter (Signed)
 Requested Prescriptions  Pending Prescriptions Disp Refills   metFORMIN  (GLUCOPHAGE -XR) 500 MG 24 hr tablet [Pharmacy Med Name: metFORMIN  HCl ER 500 MG Oral Tablet Extended Release 24 Hour] 270 tablet 0    Sig: TAKE 3 TABLETS BY MOUTH ONCE DAILY WITH BREAKFAST (MUST  KEEP  UPCOMING  APPOINTMENT  FOR  ADDITIONAL  REFILLS)     Endocrinology:  Diabetes - Biguanides Failed - 03/14/2024  3:45 PM      Failed - HBA1C is between 0 and 7.9 and within 180 days    Hgb A1c MFr Bld  Date Value Ref Range Status  08/31/2023 7.6 (H) <5.7 % Final    Comment:    For someone without known diabetes, a hemoglobin A1c value of 6.5% or greater indicates that they may have  diabetes and this should be confirmed with a follow-up  test. . For someone with known diabetes, a value <7% indicates  that their diabetes is well controlled and a value  greater than or equal to 7% indicates suboptimal  control. A1c targets should be individualized based on  duration of diabetes, age, comorbid conditions, and  other considerations. . Currently, no consensus exists regarding use of hemoglobin A1c for diagnosis of diabetes for children. .          Failed - B12 Level in normal range and within 720 days    No results found for: VITAMINB12       Failed - Valid encounter within last 6 months    Recent Outpatient Visits           1 week ago Viral URI with cough   [facility] [place] Marsa PARAS, DO   2 months ago Chronic right shoulder pain   [facility] [place], Angeline ORN, NP   6 months ago Encounter for general adult medical examination with abnormal findings   [facility] [place], Angeline ORN, NP              Failed - CBC within normal limits and completed in the last 12 months    WBC  Date Value Ref Range Status  08/31/2023 9.9 3.8 - 10.8 Thousand/uL Final   RBC  Date Value Ref Range Status  08/31/2023 4.90 3.80 - 5.10  Million/uL Final   Hemoglobin  Date Value Ref Range Status  08/31/2023 13.3 11.7 - 15.5 g/dL Final   HCT  Date Value Ref Range Status  08/31/2023 42.5 35.0 - 45.0 % Final   MCHC  Date Value Ref Range Status  08/31/2023 31.3 (L) 32.0 - 36.0 g/dL Final    Comment:    For adults, a slight decrease in the calculated MCHC value (in the range of 30 to 32 g/dL) is most likely not clinically significant; however, it should be interpreted with caution in correlation with other red cell parameters and the patient's clinical condition.    [facility]  Date Value Ref Range Status  08/31/2023 27.1 27.0 - 33.0 pg Final   MCV  Date Value Ref Range Status  08/31/2023 86.7 80.0 - 100.0 fL Final   No results found for: PLTCOUNTKUC, LABPLAT, POCPLA RDW  Date Value Ref Range Status  08/31/2023 13.2 11.0 - 15.0 % Final         Passed - Cr in normal range and within 360 days    Creat  Date Value Ref Range Status  08/31/2023 0.63 0.60 - 1.00 mg/dL Final   Creatinine,  Urine  Date Value Ref Range Status  12/05/2022 57 20 - 275 mg/dL Final         Passed - eGFR in normal range and within 360 days    GFR, Est African American  Date Value Ref Range Status  06/10/2018 91 > OR = 60 mL/min/1.4m2 Final   GFR, Est Non African American  Date Value Ref Range Status  06/10/2018 79 > OR = 60 mL/min/1.72m2 Final   eGFR  Date Value Ref Range Status  08/31/2023 95 > OR = 60 mL/min/1.8m2 Final

## 2024-03-23 ENCOUNTER — Encounter: Payer: Self-pay | Admitting: Internal Medicine

## 2024-03-23 ENCOUNTER — Ambulatory Visit: Admitting: Internal Medicine

## 2024-03-23 VITALS — BP 114/72 | Ht 61.0 in | Wt 159.4 lb

## 2024-03-23 DIAGNOSIS — Z7984 Long term (current) use of oral hypoglycemic drugs: Secondary | ICD-10-CM

## 2024-03-23 DIAGNOSIS — Z7985 Long-term (current) use of injectable non-insulin antidiabetic drugs: Secondary | ICD-10-CM

## 2024-03-23 DIAGNOSIS — M545 Low back pain, unspecified: Secondary | ICD-10-CM

## 2024-03-23 DIAGNOSIS — K219 Gastro-esophageal reflux disease without esophagitis: Secondary | ICD-10-CM

## 2024-03-23 DIAGNOSIS — G8929 Other chronic pain: Secondary | ICD-10-CM

## 2024-03-23 DIAGNOSIS — E6609 Other obesity due to excess calories: Secondary | ICD-10-CM

## 2024-03-23 DIAGNOSIS — M810 Age-related osteoporosis without current pathological fracture: Secondary | ICD-10-CM

## 2024-03-23 DIAGNOSIS — E1159 Type 2 diabetes mellitus with other circulatory complications: Secondary | ICD-10-CM

## 2024-03-23 DIAGNOSIS — Z794 Long term (current) use of insulin: Secondary | ICD-10-CM

## 2024-03-23 DIAGNOSIS — E1165 Type 2 diabetes mellitus with hyperglycemia: Secondary | ICD-10-CM

## 2024-03-23 DIAGNOSIS — E1169 Type 2 diabetes mellitus with other specified complication: Secondary | ICD-10-CM

## 2024-03-23 MED ORDER — OZEMPIC (1 MG/DOSE) 4 MG/3ML ~~LOC~~ SOPN: 1.0000 mg | PEN_INJECTOR | SUBCUTANEOUS | 1 refills | Status: AC

## 2024-03-23 NOTE — Assessment & Plan Note (Signed)
 Complicated by obesity Encouraged regular stretching and core strengthening Continue tylenol, voltaren  gel OTC She will continue to follow with orthopedics

## 2024-03-23 NOTE — Patient Instructions (Signed)

## 2024-03-23 NOTE — Progress Notes (Signed)
 "  Subjective:    Patient ID: Summer Young, female    DOB: March 12, 1950, 74 y.o.   MRN: [id number]  HPI  Patient presents to clinic today for 107-month follow-up of chronic conditions.  HTN: Associated with diabetes.  Her BP today is 114/72.  She is not taking any antihypertensive medication at this time.  ECG from 08/2018 reviewed.  HLD: Associated with diabetes.  Her last LDL was 42, triglycerides 96, 08/2023.  She denies myalgias on atorvastatin .  She tries to consume a low-fat diet.  DM2: Her last A1c was 7.6%, 08/2023.  She is taking metformin , ozempic  and tresiba  as prescribed.  Her sugars range 119-258.  She checks her feet routinely.  Her last eye exam was 09/2023.  Flu never.  Pneumovax 09/2020.  Prevnar 11/2017.  COVID Pfizer x 3.  Osteoporosis: Managed with alendronate  as prescribed.  She tries to get weightbearing exercise daily.  Bone density from 08/2022 reviewed.  OA: Mainly in her neck, back, hips and knees.  She is taking tylenol, using voltaren  gel as needed.  She is no longer taking gabapentin.  She follows with orthopedics.  GERD: Triggered by spicy foods.  She takes omeprazole  only as needed.  There is no upper GI on file.  Review of Systems     Past Medical History:  Diagnosis Date   Allergy    Chronic pain of both knees    Diabetes mellitus without complication (HCC)    Hyperlipidemia    Hypertension     Current Outpatient Medications  Medication Sig Dispense Refill   acetaminophen (TYLENOL) 650 MG CR tablet Take 650 mg by mouth 2 (two) times daily as needed for pain.     alendronate  (FOSAMAX ) 70 MG tablet Take 1 tablet (70 mg total) by mouth once a week. Take with a full glass of water on an empty stomach. 12 tablet 0   atorvastatin  (LIPITOR) 40 MG tablet Take 1 tablet by mouth once daily 90 tablet 2   blood glucose meter kit and supplies Dispense based on patient and insurance preference. Use up to four times daily as directed. (FOR ICD-10 E10.9, E11.9). 1 each 0    Blood Glucose Monitoring Suppl (ONETOUCH VERIO) w/Device KIT Use to check blood sugar as directed.  DX: E11.65 1 kit 0   Cholecalciferol (VITAMIN D3) 50 MCG (2000 UT) capsule Take 1,000 Units by mouth daily.      Continuous Glucose Sensor (FREESTYLE LIBRE 3 PLUS SENSOR) MISC Place 1 sensor on the skin every 15 days. Use to check glucose continuously 2 each 12   Diclofenac  Sodium 3 % GEL Apply 2-3 gm topically, 4 times daily.     EPINEPHrine  0.3 mg/0.3 mL IJ SOAJ injection Inject 0.3 mg into the muscle as needed for anaphylaxis. 1 each 1   fluticasone  (FLONASE ) 50 MCG/ACT nasal spray Place 2 sprays into both nostrils daily. Use for 4-6 weeks then stop and use seasonally or as needed. (Patient not taking: Reported on 03/02/2024) 16 g 0   insulin  degludec (TRESIBA  FLEXTOUCH) 100 UNIT/ML FlexTouch Pen Inject 26 Units into the skin daily. (Patient taking differently: Inject 18 Units into the skin daily.) 15 mL 0   metFORMIN  (GLUCOPHAGE -XR) 500 MG 24 hr tablet TAKE 3 TABLETS BY MOUTH ONCE DAILY WITH BREAKFAST (MUST  KEEP  UPCOMING  APPOINTMENT  FOR  ADDITIONAL  REFILLS) 270 tablet 0   omeprazole  (PRILOSEC) 40 MG capsule TAKE 1 CAPSULE BY MOUTH ONCE DAILY BEFORE BREAKFAST 90 capsule 0  OneTouch Delica Lancets 33G MISC Use to check blood sugar as directed.  DX: E11.65 100 each 1   ONETOUCH ULTRA test strip Check blood sugar 1 x daily as directed 100 each 12   OZEMPIC , 1 MG/DOSE, 4 MG/3ML SOPN INJECT 1MG  INTO THE SKIN ONCE WEEKLY 3 mL 0   SODIUM FLUORIDE 5000 ENAMEL 1.1-5 % GEL 2 (two) times daily.     No current facility-administered medications for this visit.    Allergies  Allergen Reactions   Aspirin Hives    Other reaction(s): Other (See Comments) Other Reaction: Not Assessed    Insulin  Detemir Hives and Rash    Errythema, edema, heat at site of injection did not improve after 2 weeks of use.     Shellfish Allergy Anaphylaxis   Dapagliflozin Other (See Comments)    Yeast infections  Yeast  infection  Farxiga   Influenza Vaccines Other (See Comments)    Forxiga   Other     Other reaction(s): Other (See Comments) Uncoded Allergy. Allergen: Shellfish, Other Reaction: Not Assessed    Flu vaccine given 1998 caused anaphylaxis    Family History  Problem Relation Age of Onset   Diabetes Mother    Diabetes Father    Stroke Father    Diabetes Sister    Heart attack Other    Diabetes Sister    Diabetes Sister    Diabetes Sister     Social History   Socioeconomic History   Marital status: Married    Spouse name: Reynoso,Eduardo   Number of children: Not on file   Years of education: Not on file   Highest education level: High school graduate  Occupational History   Occupation: housekeeper    Comment: self-employed   Occupation: Self- Employed  Tobacco Use   Smoking status: Never   Smokeless tobacco: Never  Vaping Use   Vaping status: Never Used  Substance and Sexual Activity   Alcohol use: Yes    Alcohol/week: 0.0 - 1.0 standard drinks of alcohol    Comment: occasional   Drug use: Never   Sexual activity: Yes    Birth control/protection: None  Other Topics Concern   Not on file  Social History Narrative   Not on file   Social Drivers of Health   Tobacco Use: Low Risk (03/02/2024)   Patient History    Smoking Tobacco Use: Never    Smokeless Tobacco Use: Never    Passive Exposure: Not on file  Financial Resource Strain: Low Risk (09/03/2023)   Overall Financial Resource Strain (CARDIA)    Difficulty of Paying Living Expenses: Not hard at all  Food Insecurity: No Food Insecurity (09/03/2023)   Epic    Worried About Programme Researcher, Broadcasting/film/video in the Last Year: Never true    Ran Out of Food in the Last Year: Never true  Transportation Needs: No Transportation Needs (09/03/2023)   Epic    Lack of Transportation (Medical): No    Lack of Transportation (Non-Medical): No  Physical Activity: Inactive (09/03/2023)   Exercise Vital Sign    Days of Exercise per Week:  0 days    Minutes of Exercise per Session: 0 min  Stress: No Stress Concern Present (09/03/2023)   [employer] of Occupational Health - Occupational Stress Questionnaire    Feeling of Stress: Not at all  Social Connections: Moderately Integrated (09/03/2023)   Social Connection and Isolation Panel    Frequency of Communication with Friends and Family: More than three times a  week    Frequency of Social Gatherings with Friends and Family: More than three times a week    Attends Religious Services: More than 4 times per year    Active Member of Clubs or Organizations: Yes    Attends Banker Meetings: More than 4 times per year    Marital Status: Separated  Intimate Partner Violence: Not At Risk (09/03/2023)   Epic    Fear of Current or Ex-Partner: No    Emotionally Abused: No    Physically Abused: No    Sexually Abused: No  Depression (PHQ2-9): Low Risk (01/05/2024)   Depression (PHQ2-9)    PHQ-2 Score: 2  Alcohol Screen: Low Risk (09/03/2023)   Alcohol Screen    Last Alcohol Screening Score (AUDIT): 0  Housing: Unknown (09/03/2023)   Epic    Unable to Pay for Housing in the Last Year: No    Number of Times Moved in the Last Year: Not on file    Homeless in the Last Year: No  Utilities: Not At Risk (09/03/2023)   Epic    Threatened with loss of utilities: No  Health Literacy: Adequate Health Literacy (09/03/2023)   B1300 Health Literacy    Frequency of need for help with medical instructions: Never     Constitutional: Denies fever, malaise, fatigue, headache or abrupt weight changes.  HEENT: Denies eye pain, eye redness, ear pain, ringing in the ears, wax buildup, runny nose, nasal congestion, bloody nose, or sore throat. Respiratory: Denies difficulty breathing, shortness of breath, cough or sputum production.   Cardiovascular: Denies chest pain, chest tightness, palpitations or swelling in the hands or feet.  Gastrointestinal: Patient reports intermittent  reflux.  Denies abdominal pain, bloating, constipation, diarrhea or blood in the stool.  GU: Denies urgency, frequency, pain with urination, burning sensation, blood in urine, odor or discharge. Musculoskeletal: Patient reports chronic joint pain.  Denies decrease in range of motion, difficulty with gait, muscle pain or joint swelling.  Skin: Denies redness, rashes, lesions or ulcercations.  Neurological: Pt reports paresthesia of right hand. Denies dizziness, difficulty with memory, difficulty with speech or problems with balance and coordination.  Psych: Denies anxiety, depression, SI/HI.  No other specific complaints in a complete review of systems (except as listed in HPI above).  Objective:   Physical Exam  BP 114/72 (BP Location: Left Arm, Patient Position: Sitting, Cuff Size: Normal)   Ht 5' 1 (1.549 m)   Wt 159 lb 6.4 oz (72.3 kg)   BMI 30.12 kg/m    Wt Readings from Last 3 Encounters:  03/02/24 158 lb (71.7 kg)  01/05/24 158 lb 3.2 oz (71.8 kg)  09/03/23 156 lb (70.8 kg)    General: Appears her stated age, obese, in NAD. Skin: Warm, dry and intact. No ulcerations noted. HEENT: Head: normal shape and size; Eyes: sclera white, no icterus, conjunctiva pink, PERRLA and EOMs intact;  Cardiovascular: Normal rate and rhythm. S1,S2 noted.  No murmur, rubs or gallops noted. No JVD or BLE edema. No carotid bruits noted. Pulmonary/Chest: Normal effort and positive vesicular breath sounds. No respiratory distress. No wheezes, rales or ronchi noted.  Abdomen: Soft and nontender. Normal bowel sounds.  Musculoskeletal: Pain with palpation over the cervical spine. Joint enlargement noted in left knee.  No difficulty with gait.  Neurological: Alert and oriented. Coordination normal.  Psychiatric: Mood and affect normal. Behavior is normal. Judgment and thought content normal.     BMET    Component Value Date/Time  NA 142 08/31/2023 1330   K 4.5 08/31/2023 1330   CL 106 08/31/2023  1330   CO2 26 08/31/2023 1330   GLUCOSE 89 08/31/2023 1330   BUN 10 08/31/2023 1330   CREATININE 0.63 08/31/2023 1330   CALCIUM  9.7 08/31/2023 1330   GFRNONAA 79 06/10/2018 0824   GFRAA 91 06/10/2018 0824    Lipid Panel     Component Value Date/Time   CHOL 116 08/31/2023 1330   TRIG 96 08/31/2023 1330   HDL 56 08/31/2023 1330   CHOLHDL 2.1 08/31/2023 1330   LDLCALC 42 08/31/2023 1330    CBC    Component Value Date/Time   WBC 9.9 08/31/2023 1330   RBC 4.90 08/31/2023 1330   HGB 13.3 08/31/2023 1330   HCT 42.5 08/31/2023 1330   PLT 353 08/31/2023 1330   MCV 86.7 08/31/2023 1330   MCH 27.1 08/31/2023 1330   MCHC 31.3 (L) 08/31/2023 1330   RDW 13.2 08/31/2023 1330   LYMPHSABS 2,967 04/12/2019 0818   EOSABS 95 04/12/2019 0818   BASOSABS 34 04/12/2019 0818    Hgb A1C Lab Results  Component Value Date   HGBA1C 7.6 (H) 08/31/2023            Assessment & Plan:      RTC in 6 months for your annual exam Angeline Laura, NP  "

## 2024-03-23 NOTE — Assessment & Plan Note (Signed)
 Complicated by obesity CMET and lipid profile today Encouraged her to consume a low fat diet Continue atorvastatin  40 mg daily

## 2024-03-23 NOTE — Assessment & Plan Note (Addendum)
 Complicated by obesity A1C and urine microalbumin today Encouraged her to consume a low carb diet and exercise for weight loss Continue metformin  1500 mg in a.m., glipizide  1 mg weekly and tresiba  26 units daily Encouraged routine eye exam Encouraged routine foot exam She declines flu shot  Pneumonia vaccine UTD

## 2024-03-23 NOTE — Assessment & Plan Note (Signed)
 Encouraged diet and exercise for weight loss ?

## 2024-03-23 NOTE — Assessment & Plan Note (Signed)
 Complicated by obesity Controlled off meds Reinforced DASH diet and exercise for weight loss

## 2024-03-23 NOTE — Assessment & Plan Note (Signed)
 Complicated by obesity Avoid foods that trigger your reflux Encouraged weight loss as this can help reduce reflux symptoms Continue omeprazole  40 mg daily

## 2024-03-23 NOTE — Assessment & Plan Note (Signed)
 Continue alendronate  70 mg weekly Encouraged diet and exercise for weight loss

## 2024-03-24 ENCOUNTER — Ambulatory Visit: Payer: Self-pay | Admitting: Internal Medicine

## 2024-03-24 LAB — CBC
HCT: 41.5 % (ref 35.9–46.0)
Hemoglobin: 13.3 g/dL (ref 11.7–15.5)
MCH: 27 pg (ref 27.0–33.0)
MCHC: 32 g/dL (ref 31.6–35.4)
MCV: 84.3 fL (ref 81.4–101.7)
MPV: 10.3 fL (ref 7.5–12.5)
Platelets: 344 10*3/uL (ref 140–400)
RBC: 4.92 Million/uL (ref 3.80–5.10)
RDW: 13.6 % (ref 11.0–15.0)
WBC: 8.8 10*3/uL (ref 3.8–10.8)

## 2024-03-24 LAB — LIPID PANEL
Cholesterol: 95 mg/dL
HDL: 43 mg/dL — ABNORMAL LOW
LDL Cholesterol (Calc): 33 mg/dL
Non-HDL Cholesterol (Calc): 52 mg/dL
Total CHOL/HDL Ratio: 2.2 (calc)
Triglycerides: 108 mg/dL

## 2024-03-24 LAB — COMPREHENSIVE METABOLIC PANEL WITH GFR
AG Ratio: 1.6 (calc) (ref 1.0–2.5)
ALT: 12 U/L (ref 6–29)
AST: 14 U/L (ref 10–35)
Albumin: 4.1 g/dL (ref 3.6–5.1)
Alkaline phosphatase (APISO): 108 U/L (ref 37–153)
BUN: 11 mg/dL (ref 7–25)
CO2: 27 mmol/L (ref 20–32)
Calcium: 9.1 mg/dL (ref 8.6–10.4)
Chloride: 103 mmol/L (ref 98–110)
Creat: 0.68 mg/dL (ref 0.60–1.00)
Globulin: 2.5 g/dL (ref 1.9–3.7)
Glucose, Bld: 245 mg/dL — ABNORMAL HIGH (ref 65–99)
Potassium: 4.8 mmol/L (ref 3.5–5.3)
Sodium: 138 mmol/L (ref 135–146)
Total Bilirubin: 0.4 mg/dL (ref 0.2–1.2)
Total Protein: 6.6 g/dL (ref 6.1–8.1)
eGFR: 93 mL/min/{1.73_m2}

## 2024-03-24 LAB — MICROALBUMIN / CREATININE URINE RATIO
Creatinine, Urine: 48 mg/dL (ref 20–275)
Microalb, Ur: <0.2 mg/dL

## 2024-03-24 LAB — HEMOGLOBIN A1C
Hgb A1c MFr Bld: 7.7 % — ABNORMAL HIGH
Mean Plasma Glucose: 174 mg/dL
eAG (mmol/L): 9.7 mmol/L

## 2024-04-15 ENCOUNTER — Other Ambulatory Visit

## 2024-08-31 ENCOUNTER — Encounter: Admitting: Internal Medicine

## 2024-09-16 ENCOUNTER — Ambulatory Visit

## 2024-09-21 ENCOUNTER — Ambulatory Visit
